# Patient Record
Sex: Female | Born: 1955
Health system: Southern US, Community
[De-identification: ages and names within clinical notes are randomized; demographics above are authoritative.]

## PROBLEM LIST (undated history)

## (undated) DIAGNOSIS — Z9289 Personal history of other medical treatment: Secondary | ICD-10-CM

## (undated) DIAGNOSIS — E039 Hypothyroidism, unspecified: Secondary | ICD-10-CM

## (undated) DIAGNOSIS — M8700 Idiopathic aseptic necrosis of unspecified bone: Secondary | ICD-10-CM

## (undated) DIAGNOSIS — N189 Chronic kidney disease, unspecified: Secondary | ICD-10-CM

## (undated) DIAGNOSIS — T8859XA Other complications of anesthesia, initial encounter: Secondary | ICD-10-CM

## (undated) DIAGNOSIS — B1910 Unspecified viral hepatitis B without hepatic coma: Secondary | ICD-10-CM

## (undated) DIAGNOSIS — B182 Chronic viral hepatitis C: Secondary | ICD-10-CM

## (undated) DIAGNOSIS — E669 Obesity, unspecified: Secondary | ICD-10-CM

## (undated) DIAGNOSIS — K219 Gastro-esophageal reflux disease without esophagitis: Secondary | ICD-10-CM

## (undated) DIAGNOSIS — Z9889 Other specified postprocedural states: Secondary | ICD-10-CM

## (undated) DIAGNOSIS — M25669 Stiffness of unspecified knee, not elsewhere classified: Secondary | ICD-10-CM

## (undated) DIAGNOSIS — IMO0001 Reserved for inherently not codable concepts without codable children: Secondary | ICD-10-CM

## (undated) DIAGNOSIS — D649 Anemia, unspecified: Secondary | ICD-10-CM

## (undated) DIAGNOSIS — J189 Pneumonia, unspecified organism: Secondary | ICD-10-CM

## (undated) DIAGNOSIS — M1990 Unspecified osteoarthritis, unspecified site: Secondary | ICD-10-CM

## (undated) DIAGNOSIS — T4145XA Adverse effect of unspecified anesthetic, initial encounter: Secondary | ICD-10-CM

## (undated) DIAGNOSIS — I809 Phlebitis and thrombophlebitis of unspecified site: Secondary | ICD-10-CM

## (undated) DIAGNOSIS — F32A Depression, unspecified: Secondary | ICD-10-CM

## (undated) DIAGNOSIS — R112 Nausea with vomiting, unspecified: Secondary | ICD-10-CM

## (undated) DIAGNOSIS — F329 Major depressive disorder, single episode, unspecified: Secondary | ICD-10-CM

## (undated) DIAGNOSIS — I10 Essential (primary) hypertension: Secondary | ICD-10-CM

## (undated) DIAGNOSIS — R252 Cramp and spasm: Secondary | ICD-10-CM

## (undated) DIAGNOSIS — R2689 Other abnormalities of gait and mobility: Secondary | ICD-10-CM

## (undated) HISTORY — PX: COLON SURGERY: SHX602

## (undated) HISTORY — PX: APPENDECTOMY: SHX54

## (undated) HISTORY — DX: Phlebitis and thrombophlebitis of unspecified site: I80.9

## (undated) HISTORY — PX: OTHER SURGICAL HISTORY: SHX169

## (undated) HISTORY — DX: Chronic kidney disease, unspecified: N18.9

## (undated) HISTORY — DX: Chronic viral hepatitis C: B18.2

## (undated) HISTORY — PX: ABDOMINAL HYSTERECTOMY: SHX81

## (undated) HISTORY — DX: Idiopathic aseptic necrosis of unspecified bone: M87.00

## (undated) HISTORY — PX: COLONOSCOPY: SHX174

## (undated) HISTORY — PX: TOTAL KNEE ARTHROPLASTY: SHX125

## (undated) HISTORY — PX: CHOLECYSTECTOMY: SHX55

## (undated) HISTORY — DX: Unspecified viral hepatitis B without hepatic coma: B19.10

## (undated) HISTORY — PX: JOINT REPLACEMENT: SHX530

---

## 1999-01-13 ENCOUNTER — Other Ambulatory Visit: Admission: RE | Admit: 1999-01-13 | Discharge: 1999-01-13 | Payer: Self-pay | Admitting: Family Medicine

## 1999-05-05 ENCOUNTER — Emergency Department (HOSPITAL_COMMUNITY): Admission: EM | Admit: 1999-05-05 | Discharge: 1999-05-05 | Payer: Self-pay | Admitting: Emergency Medicine

## 1999-05-06 ENCOUNTER — Encounter: Admission: RE | Admit: 1999-05-06 | Discharge: 1999-05-06 | Payer: Self-pay | Admitting: Hematology and Oncology

## 1999-07-15 ENCOUNTER — Emergency Department (HOSPITAL_COMMUNITY): Admission: EM | Admit: 1999-07-15 | Discharge: 1999-07-15 | Payer: Self-pay | Admitting: Emergency Medicine

## 1999-07-15 ENCOUNTER — Encounter: Payer: Self-pay | Admitting: Emergency Medicine

## 2000-03-17 ENCOUNTER — Encounter: Payer: Self-pay | Admitting: Specialist

## 2000-03-26 ENCOUNTER — Encounter: Payer: Self-pay | Admitting: Specialist

## 2000-03-26 ENCOUNTER — Inpatient Hospital Stay (HOSPITAL_COMMUNITY): Admission: RE | Admit: 2000-03-26 | Discharge: 2000-03-31 | Payer: Self-pay | Admitting: Specialist

## 2000-11-24 ENCOUNTER — Ambulatory Visit (HOSPITAL_COMMUNITY): Admission: RE | Admit: 2000-11-24 | Discharge: 2000-11-24 | Payer: Self-pay | Admitting: Family Medicine

## 2001-01-15 ENCOUNTER — Emergency Department (HOSPITAL_COMMUNITY): Admission: EM | Admit: 2001-01-15 | Discharge: 2001-01-15 | Payer: Self-pay | Admitting: Emergency Medicine

## 2001-06-28 ENCOUNTER — Emergency Department (HOSPITAL_COMMUNITY): Admission: EM | Admit: 2001-06-28 | Discharge: 2001-06-28 | Payer: Self-pay | Admitting: Emergency Medicine

## 2001-06-28 ENCOUNTER — Encounter: Payer: Self-pay | Admitting: Emergency Medicine

## 2001-11-10 ENCOUNTER — Encounter: Payer: Self-pay | Admitting: Emergency Medicine

## 2001-11-10 ENCOUNTER — Emergency Department (HOSPITAL_COMMUNITY): Admission: EM | Admit: 2001-11-10 | Discharge: 2001-11-10 | Payer: Self-pay | Admitting: *Deleted

## 2001-11-14 ENCOUNTER — Encounter: Payer: Self-pay | Admitting: General Surgery

## 2001-11-14 ENCOUNTER — Observation Stay (HOSPITAL_COMMUNITY): Admission: RE | Admit: 2001-11-14 | Discharge: 2001-11-15 | Payer: Self-pay | Admitting: General Surgery

## 2001-11-14 ENCOUNTER — Encounter (INDEPENDENT_AMBULATORY_CARE_PROVIDER_SITE_OTHER): Payer: Self-pay | Admitting: Specialist

## 2003-08-22 ENCOUNTER — Ambulatory Visit (HOSPITAL_COMMUNITY): Admission: RE | Admit: 2003-08-22 | Discharge: 2003-08-22 | Payer: Self-pay | Admitting: Family Medicine

## 2004-01-10 ENCOUNTER — Encounter: Admission: RE | Admit: 2004-01-10 | Discharge: 2004-01-10 | Payer: Self-pay | Admitting: Family Medicine

## 2004-02-29 ENCOUNTER — Emergency Department (HOSPITAL_COMMUNITY): Admission: EM | Admit: 2004-02-29 | Discharge: 2004-02-29 | Payer: Self-pay | Admitting: Emergency Medicine

## 2004-06-19 ENCOUNTER — Observation Stay (HOSPITAL_COMMUNITY): Admission: RE | Admit: 2004-06-19 | Discharge: 2004-06-20 | Payer: Self-pay | Admitting: Specialist

## 2009-03-09 ENCOUNTER — Inpatient Hospital Stay (HOSPITAL_COMMUNITY): Admission: EM | Admit: 2009-03-09 | Discharge: 2009-03-12 | Payer: Self-pay | Admitting: Emergency Medicine

## 2009-03-12 ENCOUNTER — Encounter (INDEPENDENT_AMBULATORY_CARE_PROVIDER_SITE_OTHER): Payer: Self-pay | Admitting: Internal Medicine

## 2009-04-22 ENCOUNTER — Ambulatory Visit: Payer: Self-pay | Admitting: Family Medicine

## 2009-04-25 DIAGNOSIS — D696 Thrombocytopenia, unspecified: Secondary | ICD-10-CM

## 2009-04-25 DIAGNOSIS — E039 Hypothyroidism, unspecified: Secondary | ICD-10-CM | POA: Insufficient documentation

## 2009-04-29 ENCOUNTER — Ambulatory Visit: Payer: Self-pay | Admitting: Cardiovascular Disease

## 2009-04-29 DIAGNOSIS — I739 Peripheral vascular disease, unspecified: Secondary | ICD-10-CM

## 2009-04-30 ENCOUNTER — Ambulatory Visit: Payer: Self-pay | Admitting: *Deleted

## 2009-05-03 ENCOUNTER — Encounter (INDEPENDENT_AMBULATORY_CARE_PROVIDER_SITE_OTHER): Payer: Self-pay | Admitting: *Deleted

## 2009-06-19 ENCOUNTER — Encounter: Payer: Self-pay | Admitting: Family Medicine

## 2009-06-19 ENCOUNTER — Ambulatory Visit: Payer: Self-pay | Admitting: Internal Medicine

## 2009-06-19 LAB — CONVERTED CEMR LAB
ALT: 68 units/L — ABNORMAL HIGH (ref 0–35)
AST: 82 units/L — ABNORMAL HIGH (ref 0–37)
Albumin: 4.3 g/dL (ref 3.5–5.2)
Alkaline Phosphatase: 87 units/L (ref 39–117)
Amphetamine Screen, Ur: NEGATIVE
BUN: 24 mg/dL — ABNORMAL HIGH (ref 6–23)
Barbiturate Quant, Ur: NEGATIVE
Basophils Absolute: 0 10*3/uL (ref 0.0–0.1)
Basophils Relative: 0 % (ref 0–1)
Benzodiazepines.: NEGATIVE
CO2: 21 meq/L (ref 19–32)
Calcium: 9.4 mg/dL (ref 8.4–10.5)
Chloride: 104 meq/L (ref 96–112)
Cholesterol: 148 mg/dL (ref 0–200)
Cocaine Metabolites: NEGATIVE
Creatinine, Ser: 0.95 mg/dL (ref 0.40–1.20)
Creatinine,U: 221.4 mg/dL
Eosinophils Absolute: 0.1 10*3/uL (ref 0.0–0.7)
Eosinophils Relative: 1 % (ref 0–5)
Glucose, Bld: 116 mg/dL — ABNORMAL HIGH (ref 70–99)
HCT: 40.8 % (ref 36.0–46.0)
HDL: 51 mg/dL (ref >39)
Hemoglobin: 13 g/dL (ref 12.0–15.0)
LDL Cholesterol: 74 mg/dL (ref 0–99)
Lymphocytes Relative: 37 % (ref 12–46)
Lymphs Abs: 4.1 10*3/uL — ABNORMAL HIGH (ref 0.7–4.0)
MCHC: 31.9 g/dL (ref 30.0–36.0)
MCV: 96.7 fL (ref 78.0–100.0)
Marijuana Metabolite: NEGATIVE
Methadone: NEGATIVE
Monocytes Absolute: 0.7 10*3/uL (ref 0.1–1.0)
Monocytes Relative: 6 % (ref 3–12)
Neutro Abs: 6.2 10*3/uL (ref 1.7–7.7)
Neutrophils Relative %: 56 % (ref 43–77)
Opiate Screen, Urine: NEGATIVE
Phencyclidine (PCP): NEGATIVE
Platelets: 164 10*3/uL (ref 150–400)
Potassium: 4.4 meq/L (ref 3.5–5.3)
Propoxyphene: NEGATIVE
RBC: 4.22 M/uL (ref 3.87–5.11)
RDW: 14.5 % (ref 11.5–15.5)
Sodium: 138 meq/L (ref 135–145)
TSH: 3.225 microintl units/mL (ref 0.350–4.500)
Total Bilirubin: 0.4 mg/dL (ref 0.3–1.2)
Total CHOL/HDL Ratio: 2.9
Total Protein: 8.3 g/dL (ref 6.0–8.3)
Triglycerides: 116 mg/dL (ref <150)
VLDL: 23 mg/dL (ref 0–40)
WBC: 11.1 10*3/uL — ABNORMAL HIGH (ref 4.0–10.5)

## 2009-07-12 ENCOUNTER — Ambulatory Visit: Payer: Self-pay | Admitting: Internal Medicine

## 2009-07-25 ENCOUNTER — Encounter: Payer: Self-pay | Admitting: Family Medicine

## 2009-07-25 ENCOUNTER — Ambulatory Visit: Payer: Self-pay | Admitting: Internal Medicine

## 2009-07-25 LAB — CONVERTED CEMR LAB
ALT: 60 units/L — ABNORMAL HIGH (ref 0–35)
AST: 74 units/L — ABNORMAL HIGH (ref 0–37)
Albumin: 4 g/dL (ref 3.5–5.2)
Alkaline Phosphatase: 78 units/L (ref 39–117)
BUN: 12 mg/dL (ref 6–23)
CO2: 23 meq/L (ref 19–32)
Calcium: 9 mg/dL (ref 8.4–10.5)
Chloride: 105 meq/L (ref 96–112)
Creatinine, Ser: 0.7 mg/dL (ref 0.40–1.20)
Glucose, Bld: 146 mg/dL — ABNORMAL HIGH (ref 70–99)
HCV Ab: REACTIVE — AB
Hep A Total Ab: NEGATIVE
Hep B Core Total Ab: NEGATIVE
Hep B S Ab: NEGATIVE
Hepatitis B Surface Ag: NEGATIVE
Hgb A1c MFr Bld: 5.6 % (ref 4.6–6.1)
Potassium: 3.8 meq/L (ref 3.5–5.3)
Sed Rate: 21 mm/hr (ref 0–22)
Sodium: 139 meq/L (ref 135–145)
Total Bilirubin: 0.5 mg/dL (ref 0.3–1.2)
Total Protein: 7.7 g/dL (ref 6.0–8.3)

## 2009-08-15 ENCOUNTER — Ambulatory Visit: Payer: Self-pay | Admitting: Internal Medicine

## 2009-08-15 LAB — CONVERTED CEMR LAB
HCV Quantitative: 242000 intl units/mL — ABNORMAL HIGH (ref <43)
INR: 1.08 (ref <1.50)
Prothrombin Time: 13.9 s (ref 11.6–15.2)

## 2009-08-19 ENCOUNTER — Encounter (INDEPENDENT_AMBULATORY_CARE_PROVIDER_SITE_OTHER): Payer: Self-pay | Admitting: Internal Medicine

## 2009-08-20 ENCOUNTER — Ambulatory Visit (HOSPITAL_COMMUNITY): Admission: RE | Admit: 2009-08-20 | Discharge: 2009-08-20 | Payer: Self-pay | Admitting: Internal Medicine

## 2009-08-29 ENCOUNTER — Ambulatory Visit: Payer: Self-pay | Admitting: Internal Medicine

## 2009-09-12 ENCOUNTER — Ambulatory Visit: Payer: Self-pay | Admitting: Gastroenterology

## 2009-09-12 ENCOUNTER — Encounter (INDEPENDENT_AMBULATORY_CARE_PROVIDER_SITE_OTHER): Payer: Self-pay | Admitting: *Deleted

## 2009-10-02 ENCOUNTER — Ambulatory Visit: Payer: Self-pay | Admitting: Internal Medicine

## 2009-12-23 ENCOUNTER — Ambulatory Visit: Payer: Self-pay | Admitting: Internal Medicine

## 2009-12-23 LAB — CONVERTED CEMR LAB
ALT: 61 units/L — ABNORMAL HIGH (ref 0–35)
AST: 77 units/L — ABNORMAL HIGH (ref 0–37)
Albumin: 3.9 g/dL (ref 3.5–5.2)
Alkaline Phosphatase: 90 units/L (ref 39–117)
BUN: 14 mg/dL (ref 6–23)
CO2: 22 meq/L (ref 19–32)
CRP: 0 mg/dL (ref <0.6)
Calcium: 8.9 mg/dL (ref 8.4–10.5)
Chloride: 105 meq/L (ref 96–112)
Cholesterol: 139 mg/dL (ref 0–200)
Creatinine, Ser: 0.59 mg/dL (ref 0.40–1.20)
Glucose, Bld: 100 mg/dL — ABNORMAL HIGH (ref 70–99)
HDL: 51 mg/dL (ref >39)
LDL Cholesterol: 70 mg/dL (ref 0–99)
Potassium: 4.7 meq/L (ref 3.5–5.3)
Sodium: 138 meq/L (ref 135–145)
Total Bilirubin: 0.5 mg/dL (ref 0.3–1.2)
Total CHOL/HDL Ratio: 2.7
Total Protein: 8 g/dL (ref 6.0–8.3)
Triglycerides: 91 mg/dL (ref <150)
VLDL: 18 mg/dL (ref 0–40)

## 2010-01-02 ENCOUNTER — Ambulatory Visit: Payer: Self-pay | Admitting: Internal Medicine

## 2010-01-02 LAB — CONVERTED CEMR LAB
DHEA-SO4: 21 ug/dL — ABNORMAL LOW (ref 35–430)
Magnesium: 2.1 mg/dL (ref 1.5–2.5)
Vit D, 25-Hydroxy: <10 ng/mL — ABNORMAL LOW (ref 30–89)

## 2010-01-10 ENCOUNTER — Ambulatory Visit (HOSPITAL_COMMUNITY): Admission: RE | Admit: 2010-01-10 | Discharge: 2010-01-10 | Payer: Self-pay | Admitting: Internal Medicine

## 2010-01-31 ENCOUNTER — Ambulatory Visit: Payer: Self-pay | Admitting: Internal Medicine

## 2010-02-14 ENCOUNTER — Ambulatory Visit (HOSPITAL_COMMUNITY): Admission: RE | Admit: 2010-02-14 | Discharge: 2010-02-14 | Payer: Self-pay | Admitting: Internal Medicine

## 2010-02-20 ENCOUNTER — Encounter: Admission: RE | Admit: 2010-02-20 | Discharge: 2010-04-24 | Payer: Self-pay | Admitting: Internal Medicine

## 2010-02-28 ENCOUNTER — Ambulatory Visit: Payer: Self-pay | Admitting: Internal Medicine

## 2010-04-24 ENCOUNTER — Ambulatory Visit: Payer: Self-pay | Admitting: Internal Medicine

## 2010-04-24 LAB — CONVERTED CEMR LAB
Microalb, Ur: 1.06 mg/dL (ref 0.00–1.89)
Vit D, 25-Hydroxy: 23 ng/mL — ABNORMAL LOW (ref 30–89)

## 2010-05-31 IMAGING — CT CT ANGIO CHEST
2 of 7 series · 19 of 36 positions shown · IV contrast (APPLIED)
Comparison: Chest x-ray of 03/09/2009

CLINICAL DATA: Chest pain, shortness of breath

CT ANGIOGRAPHY CHEST WITH CONTRAST
TECHNIQUE: Multidetector CT imaging of the chest was performed
using the standard protocol during bolus administration of
intravenous contrast. Multiplanar CT image reconstructions
including MIPs were obtained to evaluate the vascular anatomy.
Contrast: 100 ml [id number]-X55

[Series 8: pulm embolism 1.0 b25f thins · axial · 0.67mm/px · z∈[+966,+1214]mm · 18 of 278 slices shown]
[im 15/278  lung]
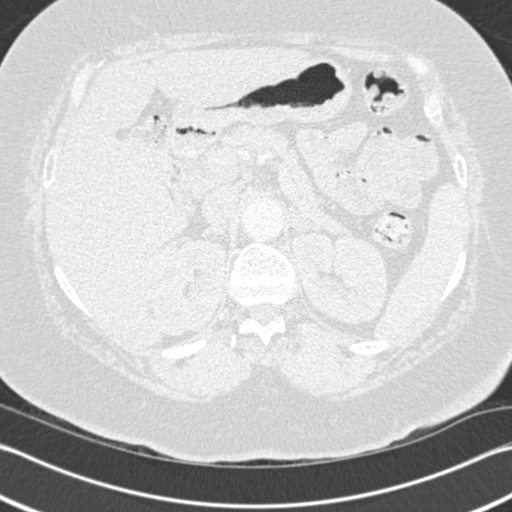
[im 30/278  mediastinal]
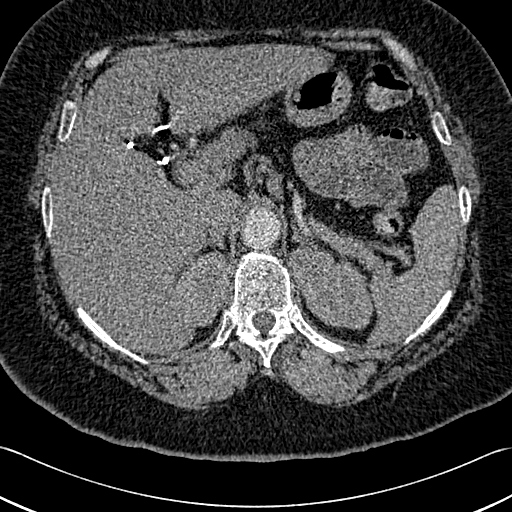
[im 44/278  lung]
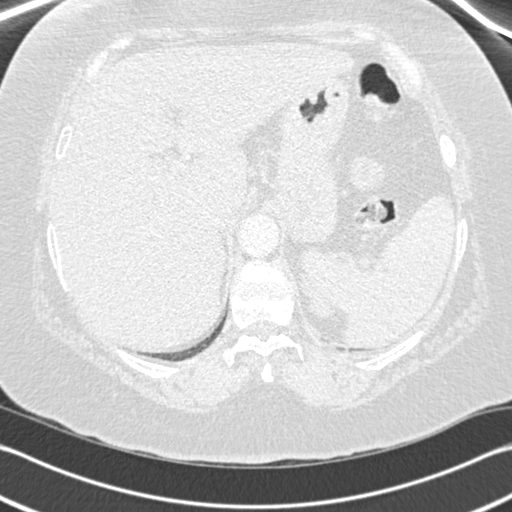
[im 59/278  mediastinal]
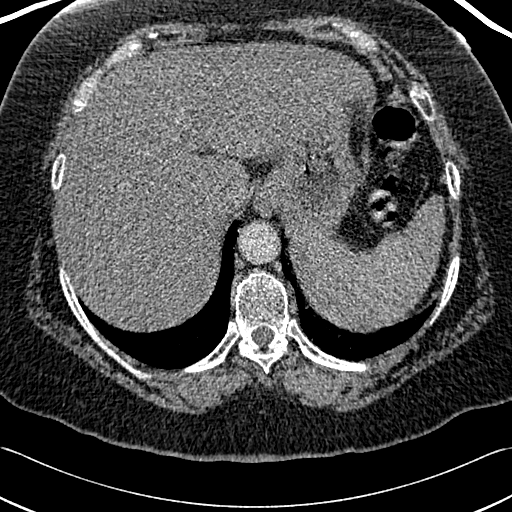
[im 73/278  lung]
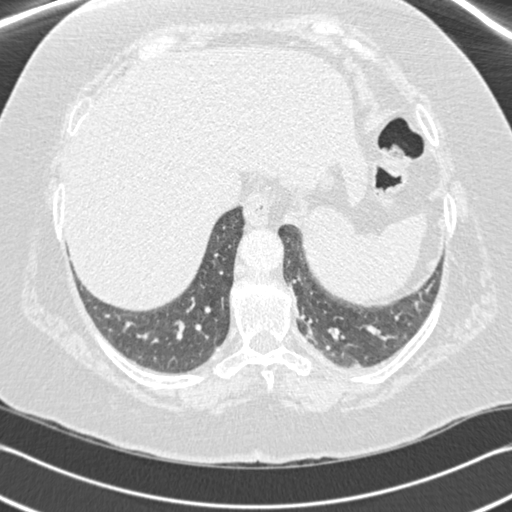
[im 88/278  mediastinal]
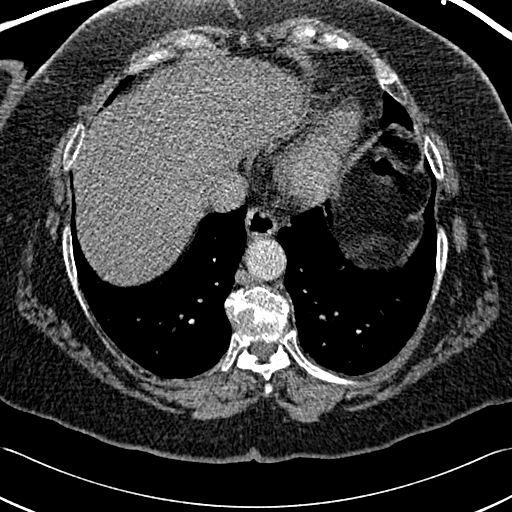
[im 103/278  lung]
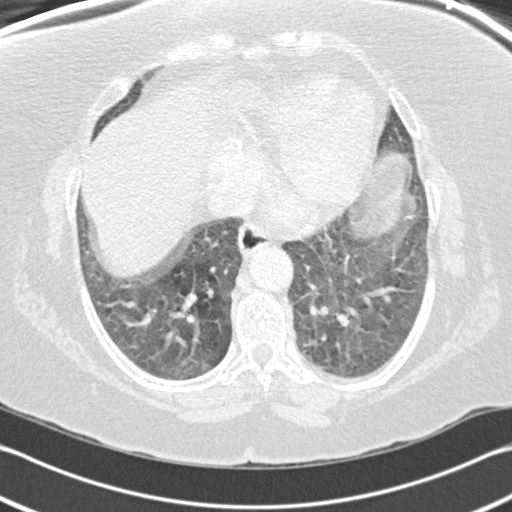
[im 117/278  mediastinal]
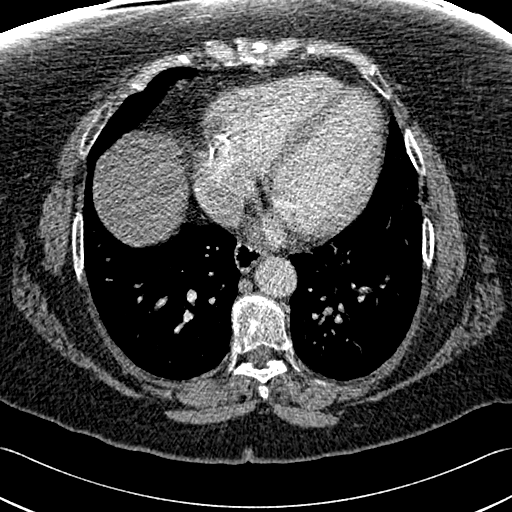
[im 132/278  lung]
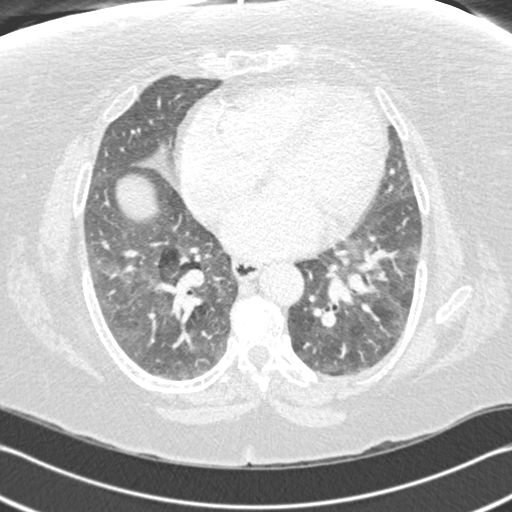
[im 146/278  mediastinal]
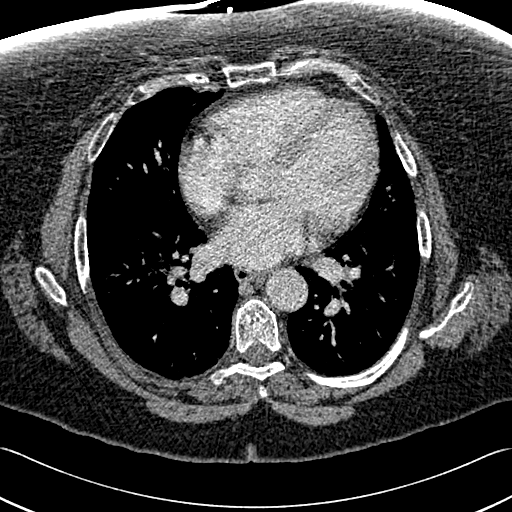
[im 161/278  lung]
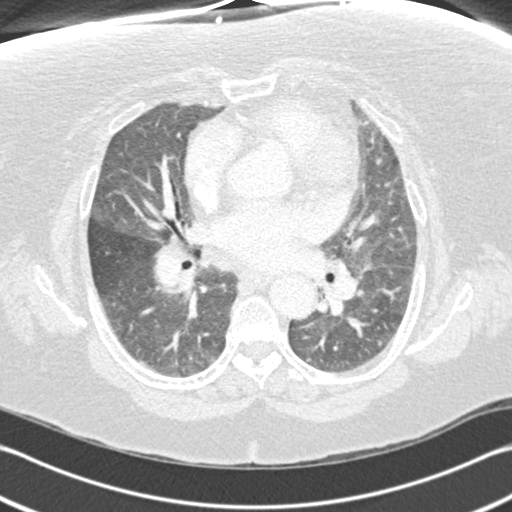
[im 175/278  mediastinal]
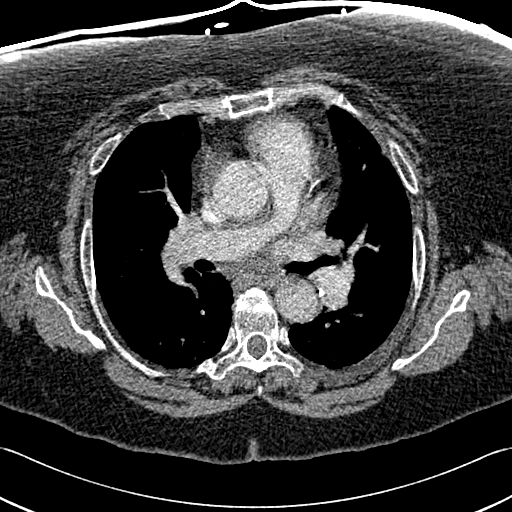
[im 190/278  lung]
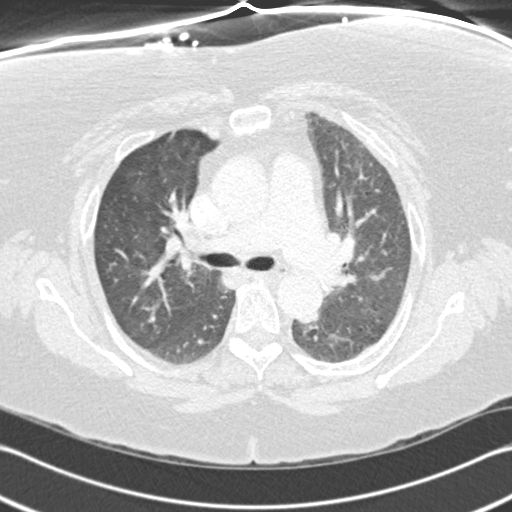
[im 205/278  mediastinal]
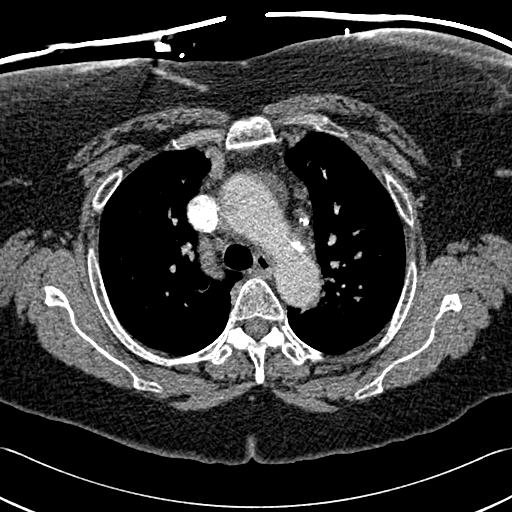
[im 219/278  lung]
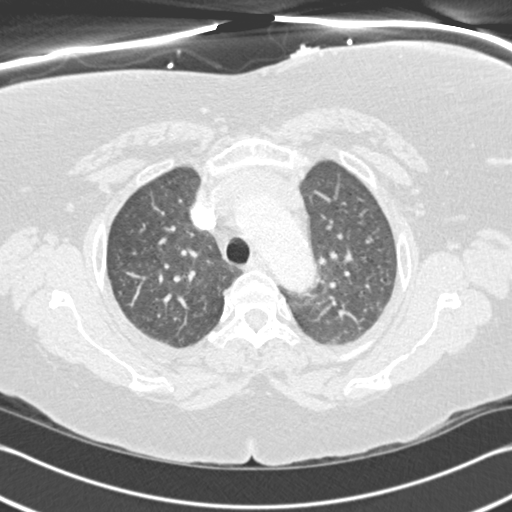
[im 234/278  mediastinal]
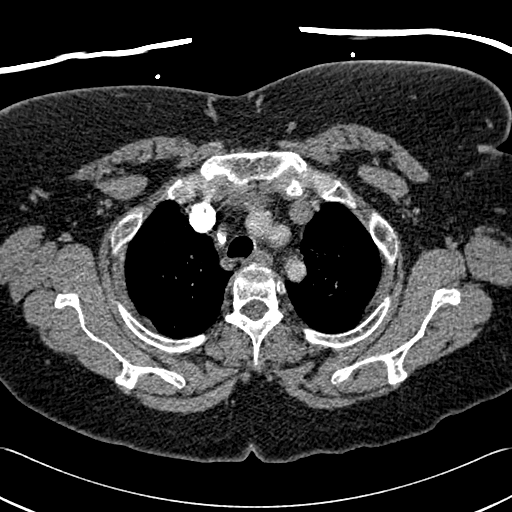
[im 248/278  lung]
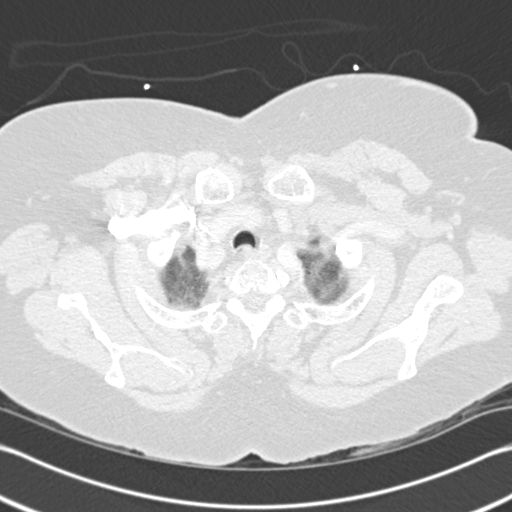
[im 263/278  mediastinal]
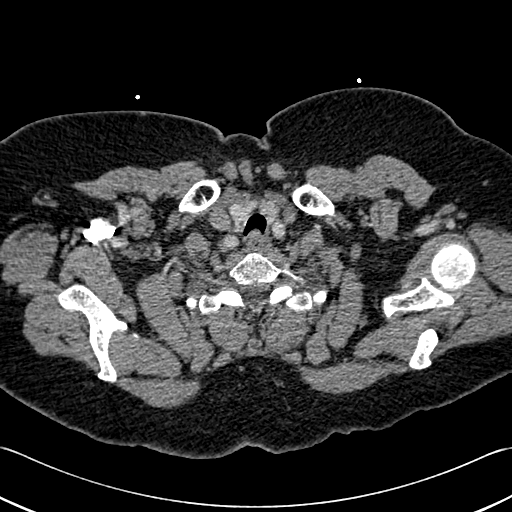

[Series 9: pulm embolism 2.0 spo thins · coronal · 0.63mm/px · 1 of 151 slices shown]
[im 76/151  mediastinal]
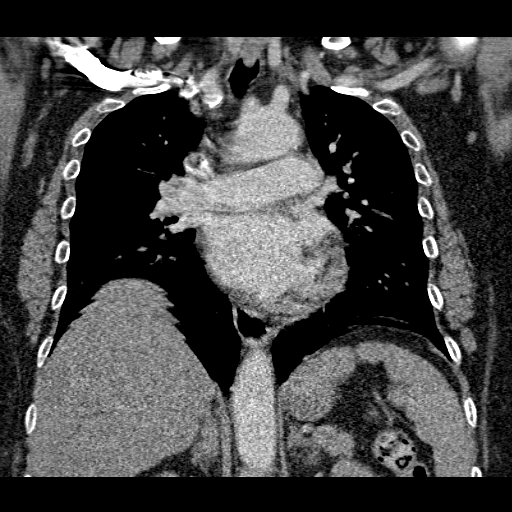

[19 of 36 positions shown; findings below may reference images not displayed]

FINDINGS: Resolution is suboptimal in this patient with large body
habitus, and opacification of the pulmonary arteries is moderate.
There is no definite evidence of pulmonary embolism identified.
The thoracic aorta opacifies with no acute abnormality noted.  No
mediastinal or hilar adenopathy is seen.  A few calcified
mediastinal nodes are present presumably due to prior granulomatous
disease.  Surgical clips are present from prior cholecystectomy.
There is some ground-glass opacity particularly within the lower
lobes, right middle lobe, and lingula.  This is nonspecific but can
be seen with acute process such as pneumonia or edema.  Follow-up
chest x-ray may be helpful.

Review of the MIP images confirms the above findings.
IMPRESSION: 1.  No evidence of acute pulmonary embolism.  No thoracic aortic
abnormality.
2.  There is vague ground-glass opacity in the lower lobes
bilaterally.  This is not specific but can be seen with pneumonia
or edema.  Consider follow-up chest x-ray.

## 2010-07-18 ENCOUNTER — Emergency Department (HOSPITAL_COMMUNITY): Admission: EM | Admit: 2010-07-18 | Discharge: 2010-07-18 | Payer: Self-pay | Admitting: Family Medicine

## 2010-07-21 ENCOUNTER — Emergency Department (HOSPITAL_COMMUNITY): Admission: EM | Admit: 2010-07-21 | Discharge: 2010-07-21 | Payer: Self-pay | Admitting: Family Medicine

## 2010-07-25 ENCOUNTER — Encounter (INDEPENDENT_AMBULATORY_CARE_PROVIDER_SITE_OTHER): Payer: Self-pay | Admitting: *Deleted

## 2010-07-25 LAB — CONVERTED CEMR LAB: HCV Quantitative: 211000 intl units/mL — ABNORMAL HIGH (ref <43)

## 2010-10-09 NOTE — Letter (Signed)
Summary: Appointment - Reminder [facility] [place]  [address]   [place], [place] [address]   Phone: [phone]  Fax: [phone]     September 12, 2009 MRN: [id number]   [facility] [address] [place], [place]  [address]   Dear Ms. Mende,  Our records indicate that it is time to schedule a follow-up appointment with Dr. Johnsie Cancel. It is very important that we reach you to schedule this appointment. We look forward to participating in your health care needs. Please contact us at the number listed above at your earliest convenience to schedule your appointment.  If you are unable to make an appointment at this time, give [place] a call so we can update our records.  Sincerely,   Darnell Level [facility] Scheduling Team

## 2010-11-18 LAB — CULTURE, ROUTINE-ABSCESS

## 2010-12-14 LAB — T4, FREE: Free T4: 0.77 ng/dL — ABNORMAL LOW (ref 0.80–1.80)

## 2010-12-14 LAB — CBC
HCT: 31.9 % — ABNORMAL LOW (ref 36.0–46.0)
HCT: 33.4 % — ABNORMAL LOW (ref 36.0–46.0)
HCT: 33.4 % — ABNORMAL LOW (ref 36.0–46.0)
Hemoglobin: 11.5 g/dL — ABNORMAL LOW (ref 12.0–15.0)
MCHC: 34.5 g/dL (ref 30.0–36.0)
MCHC: 34.6 g/dL (ref 30.0–36.0)
MCV: 93.1 fL (ref 78.0–100.0)
MCV: 93.9 fL (ref 78.0–100.0)
MCV: 94 fL (ref 78.0–100.0)
Platelets: 93 10*3/uL — ABNORMAL LOW (ref 150–400)
RBC: 3.39 MIL/uL — ABNORMAL LOW (ref 3.87–5.11)
RBC: 3.55 MIL/uL — ABNORMAL LOW (ref 3.87–5.11)
RDW: 13.9 % (ref 11.5–15.5)
WBC: 5.5 10*3/uL (ref 4.0–10.5)

## 2010-12-14 LAB — COMPREHENSIVE METABOLIC PANEL
ALT: 63 U/L — ABNORMAL HIGH (ref 0–35)
BUN: 11 mg/dL (ref 6–23)
CO2: 29 mEq/L (ref 19–32)
Calcium: 8.7 mg/dL (ref 8.4–10.5)
Creatinine, Ser: 0.65 mg/dL (ref 0.4–1.2)
GFR calc non Af Amer: >60 mL/min (ref >60)
Glucose, Bld: 88 mg/dL (ref 70–99)
Sodium: 140 mEq/L (ref 135–145)

## 2010-12-14 LAB — POCT CARDIAC MARKERS
CKMB, poc: <1 ng/mL — ABNORMAL LOW (ref 1.0–8.0)
Myoglobin, poc: 70.7 ng/mL (ref 12–200)
Myoglobin, poc: 71.2 ng/mL (ref 12–200)
Troponin i, poc: <0.05 ng/mL (ref 0.00–0.09)
Troponin i, poc: <0.05 ng/mL (ref 0.00–0.09)

## 2010-12-14 LAB — CULTURE, BLOOD (ROUTINE X 2)
Culture: NO GROWTH
Culture: NO GROWTH

## 2010-12-14 LAB — URINALYSIS, MICROSCOPIC ONLY
Glucose, UA: NEGATIVE mg/dL
Ketones, ur: NEGATIVE mg/dL
Leukocytes, UA: NEGATIVE
pH: 6 (ref 5.0–8.0)

## 2010-12-14 LAB — POCT I-STAT, CHEM 8
BUN: 11 mg/dL (ref 6–23)
Calcium, Ion: 1.01 mmol/L — ABNORMAL LOW (ref 1.12–1.32)
Chloride: 107 mEq/L (ref 96–112)
Creatinine, Ser: 0.6 mg/dL (ref 0.4–1.2)
Glucose, Bld: 82 mg/dL (ref 70–99)
HCT: 38 % (ref 36.0–46.0)
Hemoglobin: 12.9 g/dL (ref 12.0–15.0)
Potassium: 3.9 mEq/L (ref 3.5–5.1)
Sodium: 139 mEq/L (ref 135–145)
TCO2: 23 mmol/L (ref 0–100)

## 2010-12-14 LAB — CK TOTAL AND CKMB (NOT AT ARMC)
Relative Index: INVALID (ref 0.0–2.5)
Relative Index: INVALID (ref 0.0–2.5)

## 2010-12-14 LAB — BASIC METABOLIC PANEL
BUN: 9 mg/dL (ref 6–23)
Chloride: 105 mEq/L (ref 96–112)
Creatinine, Ser: 0.56 mg/dL (ref 0.4–1.2)
Glucose, Bld: 109 mg/dL — ABNORMAL HIGH (ref 70–99)
Potassium: 4.1 mEq/L (ref 3.5–5.1)

## 2010-12-14 LAB — LEGIONELLA ANTIGEN, URINE

## 2010-12-14 LAB — D-DIMER, QUANTITATIVE: D-Dimer, Quant: 0.7 ug/mL-FEU — ABNORMAL HIGH (ref 0.00–0.48)

## 2010-12-14 LAB — TROPONIN I: Troponin I: 0.01 ng/mL (ref 0.00–0.06)

## 2010-12-14 LAB — FOLATE RBC: RBC Folate: 603 ng/mL — ABNORMAL HIGH (ref 180–600)

## 2010-12-14 LAB — LIPID PANEL: Triglycerides: 60 mg/dL (ref <150)

## 2011-01-20 NOTE — H&P (Signed)
NAMEKOBEE, SIKKEMA                 ACCOUNT NO.:  [id number]   MEDICAL RECORD NO.:  [id number]          PATIENT TYPE:  INP   LOCATION:  3707                         FACILITY:  [facility]   PHYSICIAN:  Rise Patience, MDDATE OF BIRTH:  1956-04-09   DATE OF ADMISSION:  03/09/2009  DATE OF DISCHARGE:                              HISTORY & PHYSICAL   PRIMARY CARE PHYSICIAN:  Unassigned.   CHIEF COMPLAINT:  Chest pain.   HISTORY OF PRESENT ILLNESS:  A 55 year old female with known history of  tobacco use comes in with complaint of chest pain since last evening.  The patient's chest pain has been pressure-like, retrosternal which  initially started off in the axillary region and now is more  retrosternal on the left anterior chest wall, constant, heavy,  nonradiating.  The patient also has another component of chest pain  which increases when she takes deep breaths.  Has no exertional  symptoms.  Denies any fever or chills, cough or productive sputum.  He  denies any nausea or vomiting, diarrhea, abdominal pain, dizziness, loss  of consciousness, or focal deficits.   The patient had a CT scan of the chest which showed a vague ground glass  opacity of the lower lobes bilaterally.  It is nonspecific but could be  seen in pneumonia or edema.  At this time the patient is being admitted  for further evaluation and management of possible pneumonia and chest  pain.   PAST MEDICAL HISTORY:  Nothing significant seen.   PAST SURGICAL HISTORY:  1. Multiple surgeries for right knee.  2. Cholecystectomy.   MEDICATIONS PRIOR TO ADMISSION:  Advil p.r.n.   ALLERGIES:  PENICILLIN caused a severe reaction, hives and angioedema.   FAMILY HISTORY:  Nothing positive.   SOCIAL HISTORY:  The[ smoked cigarettes.  Has been advised to quit  smoking.  No alcohol or drug abuse.   REVIEW OF SYSTEMS:  Per history of present illness.  Nothing else  significant.   PHYSICAL EXAMINATION:  GENERAL:  The  patient was examined at the  bedside.  Not in acute distress.  VITAL SIGNS:  Blood pressure 134/98, pulse 60 per minute, temperature  97.9, respiratory rate 18, oxygen saturation 98%.  Marland Kitchen  HEENT:  Anicteric.  No pallor.  LUNGS:  Bilateral air entry.  No rhonchi on auscultation.  HEART:  Regular S1 and S2.  ABDOMEN:  Soft, nontender.  Bowel sounds heard.  CNS:  Alert and oriented to time, place and person.  Motor strength 5/5.  EXTREMITIES:  Peripheral pulses full. No edema.   LABORATORY DATA:  CT scan of the chest angio protocol .  No evidence of  acute pulmonary embolism.  No thoracic artery anomaly.  Vague ground  glass opacity in the lower lobes bilaterally.  This is nonspecific but  can be seen in pneumonia or edema, suggest followup chest x-ray.  EKG  showed normal sinus rhythm with T wave changes in V1 and V2 which are  nonspecific.  Hemoglobin 12.9, hematocrit 38. D-dimer 0.7.  Sodium 139,  potassium 3.9, chloride 107, glucose 32, BUN 12,  creatinine 0.6.  Troponin less than 0.05.   ASSESSMENT:  1. Chest pain, rule out effusion.  2. Possible pneumonia.  3. Ongoing tobacco abuse.   PLAN:  1. Will admit patient to telemetry.  2. Will get cultures of sputum and blood.  3. Will start the patient on Avelox.  4. Will cycle cardiac markers.  5. Get a 2-D echo.  6. Further recommendations as condition evolves.      Rise Patience, MD  Electronically Signed     ANK/MEDQ  D:  03/09/2009  T:  03/10/2009  Job:  [id number]

## 2011-01-20 NOTE — Discharge Summary (Signed)
[name], [name]                 ACCOUNT NO.:  [id number]   MEDICAL RECORD NO.:  [id number]          PATIENT TYPE:  INP   LOCATION:  3707                         FACILITY:  [facility]   PHYSICIAN:  Rexene Alberts, M.D.    DATE OF BIRTH:  Aug 10, 1956   DATE OF ADMISSION:  03/09/2009  DATE OF DISCHARGE:  03/12/2009                               DISCHARGE SUMMARY   DISCHARGE DIAGNOSES:  1. Chest pain, likely musculoskeletal in origin.  2. Ground-glass opacities seen on the CT angiogram of the chest on      March 09, 2009.  3. Tobacco abuse.  4. Thrombocytopenia, etiology unclear.  The patient's platelet count      ranged from 88,000 to 96,000.  5. Newly diagnosed hypothyroidism.  6. Chronic degenerative joint disease of the knees.  7. Bradycardia, possibly secondary to hypothyroidism.  8. Normocytic anemia.   DISCHARGE MEDICATIONS:  1. Synthroid 25 mcg daily.  2. Vicodin 5/325 mg 1 tablet 2-3 times daily as needed.  3. Over-the-counter ranitidine 1 tablet daily.  4. Advil 200 mg 1-2 tablets every 6 hours as needed for pain.  5. Multivitamin with iron once daily.   DISCHARGE DISPOSITION:  The patient is being discharged to home in  improved and stable condition.  She was advised to follow up with her  new primary care physician, Dr. Gunnar Bulla at the [facility] on  April 22, 2009 at 10:15 p.m.   CONSULTATIONS:  None.   PROCEDURES PERFORMED:  1. Chest x-ray on March 10, 2009.  The results revealed minimally      prominent markings, may be chronic in nature.  No definite active      processes seen.  2. A 2-D echocardiogram performed on March 12, 2009.  The results are      pending.  3. CT angiogram of the chest on March 09, 2009.  The results revealed no      evidence of acute pulmonary embolism.  There is a vague ground-      glass opacity in the lower lobes bilaterally.  Nonspecific.  4. Chest x-ray on March 09, 2009.  The results revealed stable chronic      interstitial change.  No  active process.   HISTORY OF PRESENT ILLNESS:  The patient is a 55 year old woman with a  past medical history significant for degenerative joint disease, who  presented to the emergency department on March 09, 2009 with a chief  complaint of chest pain.  When she was evaluated in the emergency  department, she was noted to be hemodynamically stable and afebrile.  Her chest x-ray revealed no acute ST or T-wave abnormalities.  Her heart  rate was 73 beats per minute.  Her chest x-ray revealed no acute  cardiopulmonary findings.  Her initial cardiac markers were negative.  She was admitted for further evaluation and management.   For additional details, please see the dictated history and physical.   HOSPITAL COURSE:  1. CHEST PAIN SECONDARY TO MUSCULOSKELETAL STRAIN, BRADYCARDIA,      HYPOTHYROIDISM, AND TOBACCO ABUSE.  The patient was initially  started on Lovenox prophylactically and antiplatelet therapy with      aspirin 325 mg daily.  Her pain was treated with as-needed      oxycodone and as-needed sublingual nitroglycerin.  For further      evaluation, a number of studies were ordered including additional      cardiac enzymes, D-dimer, fasting lipid panel, and 2-D      echocardiogram.  The 2-D echocardiogram was performed today and the      results are pending.  Her cardiac markers were well within normal      limits.  Her fasting lipid profile revealed a total cholesterol of      117, triglycerides of 60, HDL cholesterol of 39, and LDL      cholesterol of 66.  Her D-dimer was elevated at 0.70.  Therefore, a      CT angiogram of the chest was ordered to rule out PE.  The CT      angiogram was negative for PE, however, it did reveal vague ground-      glass opacities in the bases.  The patient had been started on      Avelox empirically in the emergency department for what was thought      to be possible pneumonia.  However, upon further questioning, she      had no complaints of  a purulent cough or shortness of breath or      subjective fever.  Therefore, Avelox was discontinued.  Her thyroid      function was assessed because of bradycardia.  Her TSH was within      normal limits at 2.78, however, her free T4 was low at 0.77.      Therefore, the patient was started on Synthroid 25 mcg daily prior      to hospital discharge.  As stated above, the results of the 2-D      echocardiogram are pending.  The patient also disclosed that she      smokes half-a-pack of cigarettes per day and has been doing so for      many years.  She was strongly admonished to stop smoking.  Upon my      examinations, the patient clearly had chest wall pain that was      somewhat positional.  The nitroglycerin which had been given in the      emergency department apparently did not help much.  She was started      on scheduled hydrocodone along with scheduled ibuprofen.  After 24      hours of this treatment, her chest pain completely resolved.  2. GROUND-GLASS OPACITIES.  The etiology of these findings is unclear.      It is uncertain whether or not these findings are chronic or new.      The differential diagnoses included pneumonia and congestive heart      failure.  Clinically, she appeared to be free of pneumonia and free      of congestive heart failure.  Further management will be deferred      to her primary care physician.  3. THROMBOCYTOPENIA.  At the time of the initial hospital assessment,      the patient's platelet count was 93.  Given this finding, Lovenox      and aspirin were discontinued following a decline in her platelet      count to 88.  Vitamin B12 and folate levels were ordered.  Both  were within normal limits (vitamin B12 was 612 and RBC folate was      603).  Her platelet count remained low but stable.  Further      evaluation will be deferred to her primary care physician.  4. DEGENERATIVE JOINT DISEASE OF THE KNEES.  The patient was      maintained on  analgesics for chronic right greater than left knee      pain.  The patient noted that she has had multiple surgeries on her      right knee which cause her to limp.      Rexene Alberts, M.D.  Electronically Signed     DF/MEDQ  D:  03/12/2009  T:  03/13/2009  Job:  [id number]   cc:   Randa Spike, MD

## 2011-01-23 NOTE — Op Note (Signed)
[facility]  Patient:    [name] Visit Number: [id number] MRN: [id number]          Service Type: EMS Location: [facility] Attending Physician:  Burnard Hawthorne Dictated by:   Orson Ape. Rise Patience, M.D. Proc. Date: 11/14/01 Admit Date:  11/10/2001 Discharge Date: 11/10/2001                             Operative Report  PREOPERATIVE DIAGNOSIS:  Chronic cholecystitis with stones.  POSTOPERATIVE DIAGNOSIS:  Chronic cholecystectomy with stones.  OPERATION:  Laparoscopic cholecystectomy with cholangiogram.  ANESTHESIA:  General.  SURGEON:  Orson Ape. Rise Patience, M.D.  ASSISTANT:  Lew Dawes. Rosana Hoes, M.D.  HISTORY:  [name] is a 55 year old female, who presented to our emergency room after being referred from the [facility] where she had visited twice.  On the first attack, she had an ultrasound and was confirmed to have gallstones.  She said she was informed that the gallstone might pass.  I am not sure exactly what really advised, but anyway she kept having little episodes and then this past weekend, had a severe episode of pain, presented again to the [facility] Emergency Room and, this time, was referred to our office. They did lab studies which were unremarkable and when I saw her in the office, she was not in acute discomfort, even though she said she had had a milder episode the previous evening.  I recommended that we proceed on with a laparoscopic cholecystectomy, and arrangements were made to put her on the OR scheduled for this morning.  The patient is ALLERGIC to PENICILLIN, said that she has had a significant allergic reaction, and she has had a knee replacement recently, and I gave her 400 mg of Cipro intravenously preoperatively.  Her lab studies were unremarkable this morning.  DESCRIPTION OF PROCEDURE:  The patient was taken to the operative suite, induction of general anesthesia, endotracheal tube and oral tube into  the stomach, and then the abdomen was prepped with Betadine surgical scrub and solution and draped in a sterile manner.  The patient is quite heavy, and a small incision was made right at the umbilicus down, identifying the fascia, and then was grasped with two Kochers and a small opening made into the peritoneal cavity.  Two traction sutures were placed, and these were grasped and then the Hasson cannula introduced.  The gallbladder was not acutely inflamed.  There were adhesions around it, and upper 10 mm trocar was placed under direct vision after anesthetizing the fascia, and the two lateral 5 mm trocars were placed by Dr. Rosana Hoes.  We grasped the gallbladder and retracted upward and outward and then the adhesions to the duodenum and omentum were taken down carefully.  Good hemostasis.  There was a stone sort of impacted in the neck of the gallbladder, and the little cystic duct was quite short, but I was able to get around it carefully, placed a clip flush with the gallbladder cystic duct junction and a small opening made in the proximal cystic duct, a Cook catheter introduced and held in place with a clip.  X-ray was obtained, and it showed prompt filling in the extrahepatic biliary system with flow into the duodenum.  Catheter was removed.  The little short cystic duct remnant was triply clipped, divided.  The cystic artery was then identified and doubly clipped proximally, singly distally, and divided, and then the gallbladder freed up  from the bed with the hook electrocautery.  There was a little area probably to the inferior artery that was doubly clipped proximally and then the gallbladder freed from its bed.  Good hemostasis was obtained, and the gallbladder was grasped and brought out through the umbilical defect.  I placed another figure-of-eight suture in the fascia for a good closure and then anesthetized the fascia at the umbilicus with Marcaine with adrenalin also.  The  carbon dioxide was released.  The irrigant fluid had been aspirated, and I then closed the subcutaneous wounds with 4-0 Vicryl and Benzoin and Steri-Strips on the skin.  The patient tolerated the procedure nicely and was sent to the recovery room in a stable postoperative condition. Hopefully, she will be ready for discharge in the a.m. and probably will be off work this week. Dictated by:   Orson Ape. Rise Patience, M.D. Attending Physician:  Burnard Hawthorne DD:  11/14/01 TD:  11/14/01 Job: [id number]

## 2011-01-23 NOTE — Op Note (Signed)
NAMEANJELITA, [name]                 ACCOUNT NO.:  [id number]   MEDICAL RECORD NO.:  [id number]          PATIENT TYPE:  AMB   LOCATION:  DAY                          FACILITY[facility]  [facility]   PHYSICIAN:  Susa Day, M.D.    DATE OF BIRTH:  1955-11-28   DATE OF PROCEDURE:  06/19/2004  DATE OF DISCHARGE:                                 OPERATIVE REPORT   PREOPERATIVE DIAGNOSIS:  Arthrofibrosis right knee, status post right total  knee arthroplasty.   POSTOPERATIVE DIAGNOSIS:  Arthrofibrosis right knee, status post right total  knee arthroplasty.   PROCEDURE PERFORMED:  1.  Right knee manipulation under anesthesia.  2.  Right knee arthroscopy, synovectomy, debridement.   HISTORY AND INDICATIONS:  This is a 55 year old, status post knee  replacement, had been doing well following her knee replacement.  Had a fall  at work, had significant pain and swelling and contusion of the knee and  limited range of motion.  She developed a painful arthrofibrosis, following  that with a reduction in her range of motion.  Operative intervention was  indicated for manipulation under anesthesia, knee arthroscopy and  debridement in an effort to return her to her preaccident range of motion.  Risks and benefits discussed including bleeding, infection, no change in  symptoms, worsening symptoms, etc.   TECHNIQUE:  The patient in supine position.  After induction of adequate  general anesthesia and 1 g of Kefzol, examined under anesthesia.  She was  about -20-80 degrees.  Performed a gentle manipulation to the knee and was  able to achieve 90 degrees and -20 degrees.  I then proceeded with a knee  arthroscopy.  I used a superomedial parapatellar portal and a medial and  lateral parapatellar portal.  I introduced the cannula atraumatically in the  suprapatellar pouch and the plastic cannula in the lateral parapatellar  portal.  I lavaged the knee.  There was significant arthrofibrosis noted in  the knee and  prior to introduction of the arthroscopic camera and the  cannula, I used the blunt trocar and cannula and swept it in the  suprapatellar pouch beneath the quadriceps cephalad and caudad to lyse  adhesions.  Then with the arthroscopy and the 4.2 coude shaver, I performed  a generalized debridement of hypertrophic fibrotic tissue, seemed to be  invaginating within to the joint space and the suprapatellar pouch.  Following this debridement, I examined this extensive debridement.  I  examined the knee.  I examined the insert.  The insert was unremarkable.  There was some minor wear noted on that as well as the components.  There  was no evidence of loosening, and the patellar component was without  evidence of trauma as well.  Following this, I removed all instrumentation,  examined the knee.  We were able to -10 to 90 degrees of range of motion.   Next, I lavaged the knee.  All instrumentation was removed.  Portals were  closed with 4-0 nylon simple sutures.  Marcaine 0.25% with epinephrine was  infiltrated in the joint.  Wound was dressed sterilely.  She was  awakened  without difficulty and transported to the recovery room in satisfactory  condition.   The patient tolerated the procedure well with no complications.    Merry Proud  JB/MEDQ  D:  06/19/2004  T:  06/19/2004  Job:  [id number]

## 2011-01-23 NOTE — Op Note (Signed)
[facility] ([facility])  Patient:    [name], [name]                        MRN: [id number] Proc. Date: 03/26/00 Adm. Date:  [id number] Attending:  Tye Savoy                           Operative Report  PREOPERATIVE DIAGNOSIS:  Degenerative joint disease, right knee.  POSTOPERATIVE DIAGNOSIS:  Degenerative joint disease, right knee.  OPERATION PERFORMED:  Right total knee arthroplasty.  ANESTHESIA:  General.  SURGEON:  Dr. Tonita Cong.  ASSISTANT:  Dr. Shellia Carwin.  COMPONENTS UTILIZED:  Osteonics posterior cruciate retaining Scorpio knee replacement #7 tibia, #7 femoral component, 26 mm patella, 10 mm polyethylene insert.  BRIEF HISTORY:  A 55 year old with refractory knee pain secondary to grade 4 chondromalacia status post arthroscopic debridement anti-inflammatories, activity modification, and persistent pain symptomatology, operative intervention was indicated for placement of degenerated knee. The patient mainly had tibial chondromalacia of the medial and lateral compartment. The risks and benefits were discussed including bleeding, infection, damage to neurovascular structures, no change in symptoms, compound failure, DVT, PE, need for replacement in the future, etc.  TECHNIQUE:  The patient in the supine position after the induction of adequate general anesthesia and 1 gm of Kefzol IV for antimicrobial prophylaxis, the right lower extremity was prepped and draped and exsanguinated in a sterile fashion. Thigh tourniquet inflated to 350 mmHg. An anterior midline incision was made over the knee. The subcutaneous tissue was dissected, electrocautery utilized to achieve hemostasis. A median parapatellar arthrotomy was performed. The patella was everted, knee was flexed. There were grade 4 changes noted in the medial and lateral compartments and in the sulcus. No significant osteophytes noted. Synovial tissue was noted and this was excised. ACL was  excised. Degenerated menisci were noted and the medial and lateral menisci were excised. PCL was preserved and found to be intact. Next, a drill was utilized, the anterior femoral canal enlarged, irrigated and evacuated, intermedullary guide placed, distal femoral cutting jig applied. This was secured. An oscillating saw was utilized to perform a distal femoral cut. This was sized and sized first to a 9 and the 9 jig was placed and cuts were made and it was felt that a 7 would be more advantageous as the trial femoral component was significantly large and over size. A 7 cutting block was then applied, impacted and the anterior, posterior and chamfering cuts were made protecting the soft tissues at all times. The trial 7 was then placed and found to be satisfactory. Next, an oscillating saw was utilized to remove the tibial spine after the knee had been flexed 30 degrees and then subluxed with a smooth Mckale. Intermedullary guide placed into the tibia, external alignment guide placed, 6 mm cut off the defect laterally. After the appropriate rotation measured with an external alignment guide. This was a 5 degree posterior slope. This jig was secured to the tibia with pins. An oscillating saw was utilized to perform the proximal tibial cut. The PCL was spared. Excellent cut was obtained. It was not particularly sclerotic bone, however. The knee was copiously irrigated with a femoral trial component at the end. The tibial trial was placed, a #7 with a 10 insert and found to extend to full extension and flexion 140 without lift-off. No instability at 0 to 30 degrees. With the knee in full extension, the external  alignment guide was placed and the appropriate rotation marked on the tibia with an alignment guide dissecting the malleoli. Next, the patella was everted and measured to a 26. A patella jig was placed and reamed 10 mm in depth. The peg holes were then subsequently drilled and the trial  26 impacted into the recess, redundant cartilage and bone removed to a good flush region. This was then everted and with flexion extension with some lateral tracking, the lateral release was performed. Next, the trial components were removed, the knee was flexed, McKales placed preserving soft tissue and the step punch was utilized to place the fin guides into the proximal tibia in the appropriate eversion utilizing the cutting tower. Next, the wound was copiously irrigated, proximal tibia dried. Cement was centrifugated. Next to allowed to partially cure, placed in the proximal tibia and then a 7 permanent component was impacted on the tibia in appropriate eversion, redundant cement removed. A trial 10 was then placed, the tibia was reduced after the [facility] was removed. The end of the femur was dried, cement placed and on the posterior runners a 7 femur was then impacted an redundant cement removed. The knee was in full extension and actual load applied, redundant cement removed. The 26 permanent patella was then secured in the patella recess after cementing, the clamp applied, redundant cement removed. The knee in full extension, antibiotic irrigation, full curing of the cement was allowed. Following this, the trial insert was then removed, joint inspected, no evidence of residual osteophytes. Small areas of redundant cement were removed. The wound copiously irrigated. A 10 was found to fit satisfactory with full extension and flexion to 140 degrees. No instability. A permanent 10 polyethylene insert was then impacted and again ranged 0-140 range with known stability of 30 degrees. More formal lateral release was then performed utilizing electrocautery. Excellent alignment was noting. Good tracking. Next, a Hemovac was placed, brought in through the stab wound of the skin. The parapatellar incision was then closed with #1 Vicryl interrupted figure-of-eight sutures. Thrombin soaked Gelfoam  was placed in the lateral release region. The subcutaneous tissue was reapproximated with 2-0 Vicryl simple sutures, skin was reapproximated with staples and the wound was dressed  sterilely. The tourniquet was deflated with active vascularization of the lower extremity appreciated. The patient tolerated the procedure well with no complications. Tourniquet time was 210 minutes.  Blood loss was 100 cc. DD:  03/26/00 TD:  03/30/00 Job: [id number]

## 2011-01-23 NOTE — Discharge Summary (Signed)
[facility]  Patient:    [name], [name]                        MRN: [id number] Adm. Date:  [id number] Disc. Date: [id number] Attending:  Tye Savoy Dictator:   Charlott Rakes, P.A.                           Discharge Summary  ADMISSION DIAGNOSES: 1. Degenerative joint disease right knee. 2. Labile anxiety. 3. Anaphylactic reaction to penicillin.  DISCHARGE DIAGNOSES: 1. Degenerative joint disease right knee. 2. Labile anxiety. 3. Anaphylactic reaction to penicillin. 4. Posthemorrhagic anemia. 5. Proteinuria, resolved.  PROCEDURES:  On March 26, 2000, the patient underwent right total knee arthroplasty performed by Dr. Tonita Cong, assisted by Dr. Shellia Carwin, under general anesthesia.  CONSULTATIONS:  None.  HISTORY OF PRESENT ILLNESS:  [name] is a 55 year old white female with refractory right knee pain secondary to grade 4 chondromalacia noted on previous arthroscopic debridement of the right knee.  She has had little to no response to conservative treatment including anti-inflammatories, activity modification, and is now unable to get relief of her pain with analgesics. Due to these significant findings as well as continued symptoms, it was felt she would require surgical intervention and was admitted to undergo the procedure as stated above.  HOSPITAL COURSE:  The patient tolerated the procedure without difficulty. Postoperatively, neurovascular motor functions of the lower extremities was noted to be intact.  The patient was placed on the usual PCA analgesics postoperatively and weaned to p.o. analgesics without difficulty.  She did have muscle spasms which were quite frequent to the right knee and required IV Robaxin weaning to p.o. Robaxin without difficulties.  The patient was placed on the usual total knee replacement protocol for ambulation and gait training as well as range of motion and strengthening exercises.  She was  allowed weightbearing as tolerated on the operative extremity.  Range of motion was done actively by the physical therapist and passively with the CPM machine. Prior to discharge, the patient had walked as much as 60 and 75 feet.  Passive range of motion was to 70 degrees and active range of motion to 65 degrees. The patient received occupational therapy for ADLs and was independent with these prior to discharge.  Hemovac drain was removed from the wound on the first postoperative day, and dressing changes were done daily thereafter.  The patient had no signs of infection.  She had minimal edema of the wound throughout the remainder of the hospital stay.  The patients Foley catheter was discontinued on the third postoperative day, and she was voiding without difficulty.  She had been noted to have some sediment in her Foley bag on the second postoperative day, and a urinalysis was sent.  Her preoperative urinalysis was normal, with the exception of 1.0 urobilinogen.  The repeat on March 27, 2000, showed amber color, cloudy appearance, large hemoglobin, 100 protein, 1.0 urobilinogen, few epithelial cells, 21-50 rbcs, and 0-5 wbcs, with rare bacteria and yeast noted.  A repeat when the Foley catheter was discontinued once again showed amber, cloudy urine with 1+ urobilinogen, small leukocyte esterase, and rare bacteria.  There was no further protein noted. The patient had also been noted to have some mildly elevated glucose values during the hospital stay on chemistry studies.  These ranged from 101-131.  A hemoglobin A1C was drawn, with a normal value  of 5.4.  It was felt her elevated glucose was secondary to diet as well as IV fluids.  Other abnormality chemistry studies included hyponatremia at a value of 134 on March 28, 2000; however, repeat was normal on March 29, 2000.  The patient also had an episode of hypokalemia at 3.1 on March 29, 2000.  Urine cultures were negative.  An ______ , ANA,  and rheumatoid factor were drawn; however, at the time of this dictation, are pending.  These will be sent to Dr. Lawanda Cousins office for follow-up.  Coagulation studies on admission were normal and showed adequate signs of elevation while on Coumadin during the hospital stay.  The patient was placed on Coumadin postoperatively for DVT and PE prophylaxis and was monitored by the pharmacy throughout her hospital stay.  Hemoglobin postoperatively was 10.7 and prior to discharge was 9.0.  The patient did not require blood transfusion postoperatively.  On March 31, 2000, the patient was felt to be stable for discharge to her home.  PLAN:  The patient will be followed at her home by the home health physical therapist for the usual total knee replacement protocol with ambulation and gait training, weightbearing as tolerated, and range of motion and strengthening exercises.  She will continue to wear her knee immobilizer until she is able to do a straight leg raise.  She will utilize a walker for ambulation.  Dressing changes to be done daily at home.  The patient will be allowed to shower at home.  Weekly pro times will be drawn with the results telephoned to [facility] for adjustments in her Coumadin dosage. She will follow up with Dr. Tonita Cong two weeks from the date of her surgery. Prescriptions at discharge include: 1. Percocet #30 1-2 every four to six hours as needed for pain. 2. Robaxin #30 1 every eight hours as needed for spasm. 3. Coumadin per pharmacy protocol.  She will continue on a regular diet at home.  We will call her with the results of her pending laboratory studies.  She and her family have been advised to call the office if there are further questions or concerns prior to her return office visit. DD:  03/31/00 TD:  04/02/00 Job: [id number]

## 2011-01-23 NOTE — H&P (Signed)
[facility]  Patient:    [name], [name]                       MRN: [id number] Adm. Date:  03/26/00 Attending:  Johnn Hai, M.D. Dictator:   Elodia Florence. Clabe Seal, P.A. CC:         Billie-Lynn Bean, P.A.                         History and Physical  DATE OF BIRTH:  07-26-1956.  CHIEF COMPLAINT:  "Pain in my right knee."  PRESENT ILLNESS:  This is a 55 year old white female, seen by [place] for continuing problems concerning her knee.  The patient has had continuing problems for many months concerning her knee.  She has had increasing difficulty with activity and getting about.  In January of this year, she had a right knee arthroscopy and debridement of the right knee.  Unfortunately, she is continuing with pain and discomfort into the knee.  X-rays have shown continuing degeneration to the knee joint itself.  Examination reveals range of motion only to 90 degrees; she has patellofemoral pain with percussion as well.  Due to the fact the patient is refractory to conservative care, which has included arthroscopic debridement, weight reduction, medications, etc, it is felt that she would benefit from surgical intervention and is being scheduled for total knee replacement arthroplasty to the right knee.  PAST MEDICAL HISTORY:  The lady has a history of anxiety, for which she takes antianxiety medication.  She had trouble with Ativan in the past, with adverse effects.  She had TB exposure at 55 years of age.  SURGICAL HISTORY:  Two D&Cs.  A partial and then a total hysterectomy in 1976. She has had three orthopedic procedures to the right knee, with the last one mentioned above.  CURRENT MEDICATIONS:  Vicodin and occasional Advil.  FAMILY PRACTITIONER:  Her family practitioner is Billie-Lynn Bean, P.A.  ALLERGIES:  PENICILLIN, which causes severe anaphylactoid-type reaction, and DEMEROL.  She has adverse psychological reactions to  ATIVAN.  SOCIAL HISTORY:  The patient is married and smokes a half to one pack of cigarettes per day and is trying to stop.  She has no intake of alcoholic beverages.  FAMILY HISTORY:  Positive for end-stage lung disease, emphysema, congestive heart failure, heart disease, multiple sclerosis, Burkitts lymphoma, Gauchers syndrome, paranoid schizophrenic in her brother, arthritis in her mother, father, grandmother, and all five siblings exposed to TB in their youth.  REVIEW OF SYSTEMS:  CNS:  No seizure disorder, paralysis, numbness or double vision.  RESPIRATORY:  No productive cough.  No hemoptysis.  No shortness of breath.  CARDIOVASCULAR:  No chest pain.  No angina.  No orthopnea. GASTROINTESTINAL:  No nausea, vomiting, melena or bloody stools. GENITOURINARY:  No discharge, dysuria or hematuria.  MUSCULOSKELETAL: Primarily in present illness.  PHYSICAL EXAMINATION:  GENERAL:  This is an alert, cooperative, friendly, obese 55 year old white female whose vital signs are blood pressure 110/74, pulse 68 and respirations are 12.  HEENT:  Normocephalic.  Oropharynx is clear.  CHEST:  Clear to auscultation.  No rhonchi nor rales.  HEART:  Regular rate and rhythm.  No murmurs are heard.  ABDOMEN:  Soft and nontender.  Liver and spleen not felt.  GENITALIA/PELVIC:  Not done, not pertinent to present illness.  RECTAL:  Not done, not pertinent to present illness.  BREASTS:  Not done,  not pertinent to present illness.  EXTREMITIES:  Right knee as in present illness as above.  ADMITTING DIAGNOSES: 1. Degenerative joint disease, right knee. 2. Labile anxiety.  PLAN:  The patient will be admitted for right total knee replacement arthroplasty.  She has not donated any blood for this procedure.  After discussion with the patient, she would rather go home after her regular hospitalization, with home health equipment, etc.  Billie-Lynn Bean, P.A. is aware of this patients surgery.  We  will ask her group to follow along with [place], should we need any assistance medically in the hospital. DD:  03/18/00 TD:  03/18/00 Job: [id number]

## 2011-07-10 ENCOUNTER — Other Ambulatory Visit (HOSPITAL_COMMUNITY): Payer: Self-pay | Admitting: Family Medicine

## 2011-07-10 DIAGNOSIS — Z780 Asymptomatic menopausal state: Secondary | ICD-10-CM

## 2011-07-21 ENCOUNTER — Ambulatory Visit (HOSPITAL_COMMUNITY): Payer: Self-pay

## 2011-08-03 ENCOUNTER — Ambulatory Visit (HOSPITAL_COMMUNITY)
Admission: RE | Admit: 2011-08-03 | Discharge: 2011-08-03 | Disposition: A | Discharge status: Home / Self Care | Payer: Self-pay | Source: Ambulatory Visit | Attending: Family Medicine | Admitting: Family Medicine

## 2011-08-03 DIAGNOSIS — Z780 Asymptomatic menopausal state: Secondary | ICD-10-CM | POA: Insufficient documentation

## 2011-08-03 DIAGNOSIS — Z13820 Encounter for screening for osteoporosis: Secondary | ICD-10-CM | POA: Insufficient documentation

## 2011-10-17 ENCOUNTER — Emergency Department (HOSPITAL_COMMUNITY)
Admission: EM | Admit: 2011-10-17 | Discharge: 2011-10-17 | Disposition: A | Discharge status: Home / Self Care | Payer: Medicaid Other | Source: Home / Self Care | Attending: Emergency Medicine | Admitting: Emergency Medicine

## 2011-10-17 ENCOUNTER — Encounter (HOSPITAL_COMMUNITY): Payer: Self-pay | Admitting: *Deleted

## 2011-10-17 ENCOUNTER — Emergency Department (INDEPENDENT_AMBULATORY_CARE_PROVIDER_SITE_OTHER): Payer: Medicaid Other

## 2011-10-17 DIAGNOSIS — S300XXA Contusion of lower back and pelvis, initial encounter: Secondary | ICD-10-CM

## 2011-10-17 HISTORY — DX: Anemia, unspecified: D64.9

## 2011-10-17 HISTORY — DX: Essential (primary) hypertension: I10

## 2011-10-17 HISTORY — DX: Major depressive disorder, single episode, unspecified: F32.9

## 2011-10-17 HISTORY — DX: Depression, unspecified: F32.A

## 2011-10-17 HISTORY — DX: Obesity, unspecified: E66.9

## 2011-10-17 HISTORY — DX: Hypothyroidism, unspecified: E03.9

## 2011-10-17 MED ORDER — DICLOFENAC SODIUM 1 % TD GEL: 1.0000 "application " | Freq: Four times a day (QID) | TRANSDERMAL | Status: DC

## 2011-10-17 NOTE — ED Notes (Signed)
Fell 10 days ago injured lower back  Ice/head/stretching without relief

## 2011-10-17 NOTE — ED Notes (Signed)
Pt fell x 10 days ago low back pain - per pt injured leg which has improved - remains with low back pain pain when sitting - standing and walking some pain relief - per pt has tried head/ice/stretching without relief

## 2011-10-17 NOTE — ED Provider Notes (Signed)
History     CSN: [id number]  Arrival date & time 10/17/11  1458   First MD Initiated Contact with Patient 10/17/11 1542      Chief Complaint  Patient presents with  . Back Pain    (Consider location/radiation/quality/duration/timing/severity/associated sxs/prior treatment) HPI Comments: Patient states she had a mechanical fall 10 days ago, landing on her knee, hip, coccyx. Denies chest pain, palpitations, presyncope, syncope causing fall. No cervical, thoracic, lumbar pain. Denies any other injury. States that the hip pain has resolved, however, states the pain in her "tailbone" has gotten worse. Pain is achy, constant, nonradiating. Pain worse with walking around and sitting. Reports increased difficulty walking secondary to pain. No bruising, swelling. No paresthesias, lower extremity weakness. Has been applying ice, heat without relief. Patient has a history of osteoporosis.   ROS as noted in HPI. All other ROS negative.   Patient is a 56 y.o. female presenting with back pain. The history is provided by the patient. No language interpreter was used.  Back Pain  This is a new problem. The current episode started more than 1 week ago. The problem occurs constantly. The problem has been gradually worsening. The pain is associated with falling. The pain is present in the gluteal region. The pain does not radiate. She has tried heat, ice and walking for the symptoms. The treatment provided no relief. Risk factors include obesity and a history of osteoporosis.    Past Medical History  Diagnosis Date  . Osteoporosis   . Depression   . Hypertension   . Hypothyroid   . Hypercholesterolemia   . Anemia   . Obesity     Past Surgical History  Procedure Date  . Abdominal hysterectomy   . Total knee arthroplasty     Family History  Problem Relation Age of Onset  . Cancer Mother   . Diabetes Mother   . Cancer Father     History  Substance Use Topics  . Smoking status: Current  Everyday Smoker  . Smokeless tobacco: Not on file  . Alcohol Use: No    OB History    Grav Para Term Preterm Abortions TAB SAB Ect Mult Living                  Review of Systems  Musculoskeletal: Positive for back pain.    Allergies  Demerol and Penicillins  Home Medications   Current Outpatient Rx  Name Route Sig Dispense Refill  . ALENDRONATE SODIUM 10 MG PO TABS Oral Take by mouth every 7 (seven) days. Take with a full glass of water on an empty stomach.    . DULOXETINE HCL 20 MG PO CPEP Oral Take 60 mg by mouth daily.    Marland Kitchen HYDROCODONE-ACETAMINOPHEN 5-325 MG PO TABS Oral Take 1 tablet by mouth every 6 (six) hours as needed.    Marland Kitchen LISINOPRIL 20 MG PO TABS Oral Take 20 mg by mouth daily.    . MULTIVITAMINS PO CAPS Oral Take 1 capsule by mouth daily.    Marland Kitchen DICLOFENAC SODIUM 1 % TD GEL Topical Apply 1 application topically 4 (four) times daily. 100 g 0    BP 148/82  Pulse 90  Temp(Src) 98.5 F (36.9 C) (Oral)  Resp 22  SpO2 98%  Physical Exam  Nursing note and vitals reviewed. Constitutional: She is oriented to person, place, and time. She appears well-developed and well-nourished. No distress.  HENT:  Head: Normocephalic and atraumatic.  Eyes: Conjunctivae and EOM are normal.  Neck: Normal range of motion.  Cardiovascular: Normal rate.   Pulmonary/Chest: Effort normal.  Abdominal: She exhibits no distension.  Musculoskeletal: Normal range of motion.       Lumbar back: She exhibits normal pulse.       No cervical, thoracic, lumbar, SI joint tenderness. Tenderness over the coccyx. No crepitus, bruising noted. No tenderness along iliac crests. Hips nontender, no bruising noted on pelvis or leg.. Antalgic gait. Patient ambulating with a cane.  Neurological: She is alert and oriented to person, place, and time.  Skin: Skin is warm and dry.  Psychiatric: She has a normal mood and affect. Her behavior is normal. Judgment and thought content normal.    ED Course    Procedures (including critical care time)  Labs Reviewed - No data to display Dg Pelvis 1-2 Views  10/17/2011  *RADIOLOGY REPORT*  Clinical Data: [place] 10 days ago.  Tail bone is extremely sore. Hurts when sitting.  PELVIS - 1-2 VIEW  Comparison: None  Findings: There is marked deformity of the right femoral head, associated superior migration of the deformed head within the acetabulum.  There has been remodelling of the acetabulum and marked loss of the joint space.  There is sclerosis on both sides of the joint, combined with osteophyte formation.  Findings are most consistent with prior trauma versus infection.  The left hip is unremarkable in appearance.  There is no evidence for acute fracture.  Regional bowel gas pattern is nonobstructive.  IMPRESSION: Chronic changes in the right hip.  Original Report Authenticated By: Glenice Bow, M.D.   Dg Sacrum/coccyx  10/17/2011  *RADIOLOGY REPORT*  Clinical Data: [place] 10 days ago.  Tail bone is sore. Pain with sitting.  SACRUM AND COCCYX - 2+ VIEW  Comparison: AP pelvis 10/17/2011  Findings: AP views are performed of the sacral coccyx, showing no evidence for acute fracture or dislocation.  The frontal view detail is limited secondary to overlying bowel gas.  Partially seen is deformity of the right hip, better seen on the AP view of the pelvis of the same date.  IMPRESSION:  1. No evidence for acute  abnormality. 2.  Deformity of the right hip.  Original Report Authenticated By: Glenice Bow, M.D.     1. Coccygeal contusion    Dg Pelvis 1-2 Views  10/17/2011  *RADIOLOGY REPORT*  Clinical Data: [place] 10 days ago.  Tail bone is extremely sore. Hurts when sitting.  PELVIS - 1-2 VIEW  Comparison: None  Findings: There is marked deformity of the right femoral head, associated superior migration of the deformed head within the acetabulum.  There has been remodelling of the acetabulum and marked loss of the joint space.  There is sclerosis on both  sides of the joint, combined with osteophyte formation.  Findings are most consistent with prior trauma versus infection.  The left hip is unremarkable in appearance.  There is no evidence for acute fracture.  Regional bowel gas pattern is nonobstructive.  IMPRESSION: Chronic changes in the right hip.  Original Report Authenticated By: Glenice Bow, M.D.   Dg Sacrum/coccyx  10/17/2011  *RADIOLOGY REPORT*  Clinical Data: [place] 10 days ago.  Tail bone is sore. Pain with sitting.  SACRUM AND COCCYX - 2+ VIEW  Comparison: AP pelvis 10/17/2011  Findings: AP views are performed of the sacral coccyx, showing no evidence for acute fracture or dislocation.  The frontal view detail is limited secondary to overlying bowel gas.  Partially seen is deformity  of the right hip, better seen on the AP view of the pelvis of the same date.  IMPRESSION:  1. No evidence for acute  abnormality. 2.  Deformity of the right hip.  Original Report Authenticated By: Glenice Bow, M.D.    Imaging reviewed by myself. Report per radiologist.  MDM  Previous records reviewed. Per chart review patient has a history of A. fib, the patient states that she does not.    Check x-ray of coccyx/ sacrum, and pelvis as patient has history of advanced osteoporosis. Patient walks with a cane . Is on hydrocodone chronically. Will start anti-inflammatories, have her buy a donut pillow, and f/u with orthopedics.   Discussed results with patient. Will send home on Voltaren topical. Patient agrees with medical decision making, and plan. Will followup with Dr. Sharol Given in the next 10 days.  Cherly Beach, MD 10/17/11 [phone]

## 2012-02-02 DIAGNOSIS — B1920 Unspecified viral hepatitis C without hepatic coma: Secondary | ICD-10-CM | POA: Insufficient documentation

## 2012-02-02 DIAGNOSIS — M1990 Unspecified osteoarthritis, unspecified site: Secondary | ICD-10-CM | POA: Insufficient documentation

## 2012-08-22 ENCOUNTER — Encounter (HOSPITAL_COMMUNITY): Payer: Self-pay

## 2012-08-22 ENCOUNTER — Emergency Department (HOSPITAL_COMMUNITY)
Admission: EM | Admit: 2012-08-22 | Discharge: 2012-08-22 | Disposition: A | Discharge status: Home / Self Care | Payer: No Typology Code available for payment source | Source: Home / Self Care

## 2012-08-22 DIAGNOSIS — M1990 Unspecified osteoarthritis, unspecified site: Secondary | ICD-10-CM

## 2012-08-22 DIAGNOSIS — I10 Essential (primary) hypertension: Secondary | ICD-10-CM

## 2012-08-22 DIAGNOSIS — F329 Major depressive disorder, single episode, unspecified: Secondary | ICD-10-CM

## 2012-08-22 DIAGNOSIS — G894 Chronic pain syndrome: Secondary | ICD-10-CM

## 2012-08-22 DIAGNOSIS — Z0000 Encounter for general adult medical examination without abnormal findings: Secondary | ICD-10-CM

## 2012-08-22 MED ORDER — LISINOPRIL 20 MG PO TABS: 40.0000 mg | ORAL_TABLET | Freq: Every day | ORAL | Status: DC

## 2012-08-22 MED ORDER — OXYCODONE-ACETAMINOPHEN 5-325 MG PO TABS: 1.0000 | ORAL_TABLET | Freq: Three times a day (TID) | ORAL | Status: DC | PRN

## 2012-08-22 MED ORDER — OXYCODONE HCL 5 MG PO TABS
5.0000 mg | ORAL_TABLET | Freq: Three times a day (TID) | ORAL | Status: DC | PRN
Start: 2012-08-22 — End: 2012-08-29

## 2012-08-22 MED ORDER — HYDROCHLOROTHIAZIDE 25 MG PO TABS: 25.0000 mg | ORAL_TABLET | Freq: Every day | ORAL | Status: DC

## 2012-08-22 NOTE — ED Notes (Signed)
[facility] client- needs medication refill

## 2012-08-22 NOTE — ED Provider Notes (Signed)
History     CSN: [id number]  Arrival date & time 08/22/12  1731   First MD Initiated Contact with Patient 08/22/12 1912      Chief Complaint  Patient presents with  . Medication Refill    HPI Patient is a 56 year old female with a past medical history of chronic hepatitis B, hypertension, chronic pain syndrome, severe osteoarthritis, who presents today to establish primary care. Patient has been in her regular state of health, she continues to have chronic and severe low back pain, bilateral hip pain and right knee pain from severe degenerative joint disease. She claims she has been a orthopedic doctor at [facility] at this point she is deemed not to be operative candidate because of osteoporosis. She claims she has been dependent on narcotics for the past number of years, however since the [facility] closed, she gradually wean herself off narcotics. She however now is in persistent pain, she rates the pain at 8/10, is currently very tearful as his pain is interfering with her daily activities of living. She at this time requests that we restart her narcotics. She currently denies any other complaints like fever, nausea, vomiting, diarrhea.  Past Medical History  Diagnosis Date  . Osteoporosis   . Depression   . Hypertension   . Hypothyroid   . Hypercholesterolemia   . Anemia   . Obesity     Past Surgical History  Procedure Date  . Abdominal hysterectomy   . Total knee arthroplasty     Family History  Problem Relation Age of Onset  . Cancer Mother   . Diabetes Mother   . Cancer Father     History  Substance Use Topics  . Smoking status: Current Every Day Smoker  . Smokeless tobacco: Not on file  . Alcohol Use: No    OB History    Grav Para Term Preterm Abortions TAB SAB Ect Mult Living                  Review of Systems No fever No chest pain Shortness of breath No abdominal pain + Back pain-lower + Bilateral hip pain No dysuria No  diarrhea No nausea vomiting  Allergies  Demerol and Penicillins  Home Medications   Current Outpatient Rx  Name  Route  Sig  Dispense  Refill  . SERTRALINE HCL 50 MG PO TABS   Oral   Take 50 mg by mouth daily.         Marland Kitchen HYDROCHLOROTHIAZIDE 25 MG PO TABS   Oral   Take 1 tablet (25 mg total) by mouth daily.   30 tablet   0   . LISINOPRIL 20 MG PO TABS   Oral   Take 2 tablets (40 mg total) by mouth daily.   30 tablet   2   . OXYCODONE HCL 5 MG PO TABS   Oral   Take 1 tablet (5 mg total) by mouth every 8 (eight) hours as needed for pain.   30 tablet   0     BP 126/56  Pulse 80  Temp 98.8 F (37.1 C) (Oral)  Resp 20  SpO2 97%  Physical Exam General exam-awake alert, not in any distress HEENT-atraumatic normocephalic, pupils equally reactive to light and accommodation. Neck-supple Chest-bilaterally clear to auscultation CVS-S1-S2 regular, no murmurs heard. Abdomen-soft nontender nondistended Extremities-no edema, chronic varicose veins seen. Neurology-awake alert, and no focalities.    ED Course  Procedures (including critical care time)  Labs Reviewed - No data  to display No results found.   1. Routine health maintenance   2. DEGENERATIVE JOINT DISEASE   3. HTN (hypertension)   4. Depression   5. Pain syndrome, chronic    Hypertension - Currently controlled with lisinopril, we'll continue  Degenerative joint disease/chronic pain syndrome - Is interested in pursuing pain contract, she claims that over the counter Tylenol and nonsteroidal anti-inflammatory medications are not helping her pain. Although [facility], she did not take any of her narcotics-but her pain has been interfering with her daily activities of living, currently is very tearful and once this pain under control. For now I will give her short acting oxycodone, if she is compliant with medications and followup, she would be a good candidate to establish a pain  contract and see how she does. For now we will have her followup in 2 weeks, and see if we can be relationship with her. Pain contract was briefly discussed, and she is agreeable.  Depression - Continued Zoloft  History of hepatitis C - Check  viral load, and may need referral to the hepatitis clinic  Gen. health maintenance - Will check for compliance with followup and medications, before beginning general health maintenance-including cancer screening, osteoporosis screening and vaccinations.  Patient claims that she has prior documentation of orthopedic issues at home, I have asked her to bring all the documentation in her next visit.    MDM  Return to clinic in 2 weeks-lab work prior to next visit        Jonetta Osgood, MD 08/22/12 1956

## 2012-08-29 ENCOUNTER — Emergency Department (HOSPITAL_COMMUNITY)
Admission: EM | Admit: 2012-08-29 | Discharge: 2012-08-29 | Disposition: A | Discharge status: Home / Self Care | Payer: No Typology Code available for payment source | Source: Home / Self Care

## 2012-08-29 ENCOUNTER — Encounter (HOSPITAL_COMMUNITY): Payer: Self-pay

## 2012-08-29 DIAGNOSIS — F329 Major depressive disorder, single episode, unspecified: Secondary | ICD-10-CM

## 2012-08-29 DIAGNOSIS — I10 Essential (primary) hypertension: Secondary | ICD-10-CM

## 2012-08-29 DIAGNOSIS — B182 Chronic viral hepatitis C: Secondary | ICD-10-CM

## 2012-08-29 DIAGNOSIS — G8929 Other chronic pain: Secondary | ICD-10-CM

## 2012-08-29 DIAGNOSIS — M1990 Unspecified osteoarthritis, unspecified site: Secondary | ICD-10-CM

## 2012-08-29 MED ORDER — HYDROCHLOROTHIAZIDE 25 MG PO TABS: 25.0000 mg | ORAL_TABLET | Freq: Every day | ORAL | Status: DC

## 2012-08-29 MED ORDER — SERTRALINE HCL 50 MG PO TABS: 50.0000 mg | ORAL_TABLET | Freq: Every day | ORAL | Status: DC

## 2012-08-29 MED ORDER — OXYCODONE HCL 5 MG PO TABS: 5.0000 mg | ORAL_TABLET | Freq: Three times a day (TID) | ORAL | Status: DC | PRN

## 2012-08-29 MED ORDER — POLYETHYLENE GLYCOL 3350 17 G PO PACK: 17.0000 g | PACK | Freq: Every day | ORAL | Status: DC

## 2012-08-29 NOTE — ED Provider Notes (Signed)
Patient Demographics  [name], is a 56 y.o. female  [id number]  [id number]  DOB - 11-02-1955  Chief Complaint  Patient presents with  . Follow-up        Subjective:   [name] today is here for a follow up vist. Patient has No headache, No chest pain, No abdominal pain - No Nausea, No new weakness tingling or numbness, No Cough - SOB. Patient has chronic pain syndrome, she has chronic right and left hip deformity, and as a result of this needs chronic narcotics. We started oxycodone last visit, we talked about pain contract, ever since starting up she could, she claims she her pain is significantly well controlled. She claims she actually walked her apartment/house for the very first time in the past numerous months today. She was previously being followed by orthopedics at [facility], she was also seen hepatitis clinic as well.Lab have been ordered, these are currently pending.   Objective:    Filed Vitals:   08/29/12 1805  BP: 126/83  Pulse: 77  Temp: 98.9 F (37.2 C)  TempSrc: Oral  Resp: 18  SpO2: 95%     ALLERGIES:   Allergies  Allergen Reactions  . Demerol   . Penicillins     PAST MEDICAL HISTORY: Past Medical History  Diagnosis Date  . Osteoporosis   . Depression   . Hypertension   . Hypothyroid   . Hypercholesterolemia   . Anemia   . Obesity     PAST SURGICAL HISTORY: Past Surgical History  Procedure Date  . Abdominal hysterectomy   . Total knee arthroplasty     FAMILY HISTORY: Family History  Problem Relation Age of Onset  . Cancer Mother   . Diabetes Mother   . Cancer Father     MEDICATIONS AT HOME: Prior to Admission medications   Medication Sig Start Date End Date Taking? Authorizing Provider  hydrochlorothiazide (HYDRODIURIL) 25 MG tablet Take 1 tablet (25 mg total) by mouth daily. 08/29/12   Demarius Archila Kristeen Mans, MD  lisinopril (PRINIVIL,ZESTRIL) 20 MG tablet Take 2 tablets (40 mg total) by mouth daily. 08/22/12   Ytzel Gubler Kristeen Mans, MD  oxyCODONE (ROXICODONE) 5 MG immediate release tablet Take 1 tablet (5 mg total) by mouth every 8 (eight) hours as needed for pain. 08/29/12   Jaylaa Gallion Kristeen Mans, MD  polyethylene glycol [facility]) packet Take 17 g by mouth daily. 08/29/12   Langdon Crosson Kristeen Mans, MD  sertraline (ZOLOFT) 50 MG tablet Take 1 tablet (50 mg total) by mouth daily. 08/29/12   Nylan Nakatani Kristeen Mans, MD    Exam  General appearance :Awake, alert, not in any distress. Speech Clear. Not toxic Looking HEENT: Atraumatic and Normocephalic, pupils equally reactive to light and accomodation Neck: supple, no JVD. No cervical lymphadenopathy.  Chest:Good air entry bilaterally, no added sounds  CVS: S1 S2 regular, no murmurs.  Abdomen: Bowel sounds present, Non tender and not distended with no gaurding, rigidity or rebound. Extremities: B/L Lower Ext shows no edema, both legs are warm to touch Neurology: Awake alert, and oriented X 3, CN II-XII intact, Non focal Skin:No Rash Wounds:N/A    Data Review   CBC No results found for this basename: WBC:5,HGB:5,HCT:5,PLT:5,MCV:5,MCH:5,MCHC:5,RDW:5,NEUTRABS:5,LYMPHSABS:5,MONOABS:5,EOSABS:5,BASOSABS:5,BANDABS:5,BANDSABD:5 in the last 168 hours  Chemistries   No results found for this basename: NA:5,K:5,CL:5,CO2:5,GLUCOSE:5,BUN:5,CREATININE:5,GFRCGP,:5,CALCIUM:5,MG:5,AST:5,ALT:5,ALKPHOS:5,BILITOT:5 in the last 168 hours ------------------------------------------------------------------------------------------------------------------ No results found for this basename: HGBA1C:2 in the last 72 hours ------------------------------------------------------------------------------------------------------------------ No results found for this basename: CHOL:2,HDL:2,LDLCALC:2,TRIG:2,CHOLHDL:2,LDLDIRECT:2 in the last 72 hours ------------------------------------------------------------------------------------------------------------------ No results  found for this basename:  TSH,T4TOTAL,FREET3,T3FREE,THYROIDAB in the last 72 hours ------------------------------------------------------------------------------------------------------------------ No results found for this basename: VITAMINB12:2,FOLATE:2,FERRITIN:2,TIBC:2,IRON:2,RETICCTPCT:2 in the last 72 hours  Coagulation profile  No results found for this basename: INR:5,PROTIME:5 in the last 168 hours    Assessment & Plan   Chronic pain syndrome  - Patient's pain is very well controlled with oxycodone 5 mg 3 times a day as needed. She has had a chance to review the pain contract, and has actually signed it. Placed on MiraLax once a day. We will give her 3 week supply today.    chronic hepatitis C  - Check a viral load, and if possible will need referral to the hepatology clinic.   Hypertension - Controlled with hydrochlorothiazide and lisinopril.  Osteoarthritis/ pelvic deformity - Pain control with oxycodone - Will need referral to the orthopedic clinic-previously used to see orthopedics at [facility].  She will need blood work-prior to her next visit.   Follow-up Information    Follow up with [facility]. Schedule an appointment as soon as possible for a visit in 2 weeks.          Jonetta Osgood, MD 08/29/12 [phone]

## 2012-08-29 NOTE — ED Notes (Signed)
Patient states here  for follow up per dr. Sloan Leiter

## 2012-09-01 ENCOUNTER — Emergency Department (HOSPITAL_COMMUNITY)
Admission: EM | Admit: 2012-09-01 | Discharge: 2012-09-01 | Disposition: A | Discharge status: Home / Self Care | Payer: No Typology Code available for payment source | Source: Home / Self Care

## 2012-09-01 DIAGNOSIS — E039 Hypothyroidism, unspecified: Secondary | ICD-10-CM

## 2012-09-01 LAB — COMPREHENSIVE METABOLIC PANEL
Albumin: 3.1 g/dL — ABNORMAL LOW (ref 3.5–5.2)
BUN: 7 mg/dL (ref 6–23)
Creatinine, Ser: 0.58 mg/dL (ref 0.50–1.10)
Total Bilirubin: 0.5 mg/dL (ref 0.3–1.2)
Total Protein: 7.8 g/dL (ref 6.0–8.3)

## 2012-09-01 LAB — CHOLESTEROL, TOTAL: Cholesterol: 140 mg/dL (ref 0–200)

## 2012-09-01 LAB — CBC
MCHC: 33.4 g/dL (ref 30.0–36.0)
RDW: 13.9 % (ref 11.5–15.5)

## 2012-09-01 LAB — HEMOGLOBIN A1C: Hgb A1c MFr Bld: 5.7 % — ABNORMAL HIGH (ref <5.7)

## 2012-09-02 LAB — HEPATITIS PANEL, ACUTE
HCV Ab: REACTIVE — AB
Hep B C IgM: NEGATIVE
Hepatitis B Surface Ag: NEGATIVE

## 2012-09-08 ENCOUNTER — Telehealth (HOSPITAL_COMMUNITY): Payer: Self-pay

## 2012-09-08 NOTE — Progress Notes (Signed)
I reviewed labs and reported results to nurse and asked nurse to call patient with results which she did successfully.  Pt should have labs rechecked in 3-4 months.  Gerlene Fee, MD, CDE, [place], [place]

## 2012-09-27 ENCOUNTER — Emergency Department (HOSPITAL_COMMUNITY)
Admission: EM | Admit: 2012-09-27 | Discharge: 2012-09-27 | Disposition: A | Discharge status: Home / Self Care | Payer: No Typology Code available for payment source | Source: Home / Self Care

## 2012-09-27 ENCOUNTER — Encounter (HOSPITAL_COMMUNITY): Payer: Self-pay

## 2012-09-27 DIAGNOSIS — D649 Anemia, unspecified: Secondary | ICD-10-CM

## 2012-09-27 DIAGNOSIS — I4891 Unspecified atrial fibrillation: Secondary | ICD-10-CM

## 2012-09-27 DIAGNOSIS — D696 Thrombocytopenia, unspecified: Secondary | ICD-10-CM

## 2012-09-27 DIAGNOSIS — E039 Hypothyroidism, unspecified: Secondary | ICD-10-CM

## 2012-09-27 DIAGNOSIS — M1990 Unspecified osteoarthritis, unspecified site: Secondary | ICD-10-CM

## 2012-09-27 MED ORDER — OXYCODONE HCL 5 MG PO TABS: 5.0000 mg | ORAL_TABLET | Freq: Three times a day (TID) | ORAL | Status: DC | PRN

## 2012-09-27 NOTE — ED Notes (Signed)
Patient states here for some blood results and needs pain medication refill

## 2012-09-27 NOTE — ED Provider Notes (Signed)
History     CSN: [id number]  Arrival date & time 09/27/12  1533  Chief Complaint  Patient presents with  . Follow-up   HPI Patient is presenting today to followup on the lab results from her recent tests.  She has been following her hepatitis C following a viral loads.  She usually has a yearly viral load tested and she thought she had a test in December but there was no result.  The patient says that she's planning to see a hepatologist when she gets her Medicaid benefits in the next several months.  She says that she's not going to pursue any further hepatitis C treatment until she gets her Medicaid benefits.  She is requesting refills on her pain medication.  She reports that the recent changes to her pain medication has helped her symptoms and help her to function more regularly.  She's taking immediate release oxycodone 3 times a day.  Past Medical History  Diagnosis Date  . Osteoporosis   . Depression   . Hypertension   . Hypothyroid   . Hypercholesterolemia   . Anemia   . Obesity     Past Surgical History  Procedure Date  . Abdominal hysterectomy   . Total knee arthroplasty     Family History  Problem Relation Age of Onset  . Cancer Mother   . Diabetes Mother   . Cancer Father     History  Substance Use Topics  . Smoking status: Current Every Day Smoker  . Smokeless tobacco: Not on file  . Alcohol Use: No    OB History    Grav Para Term Preterm Abortions TAB SAB Ect Mult Living                 Review of Systems  Musculoskeletal: Positive for back pain, joint swelling, arthralgias and gait problem.  All other systems reviewed and are negative.    Allergies  Demerol and Penicillins  Home Medications   Current Outpatient Rx  Name  Route  Sig  Dispense  Refill  . HYDROCHLOROTHIAZIDE 25 MG PO TABS   Oral   Take 1 tablet (25 mg total) by mouth daily.   30 tablet   2   . LISINOPRIL 20 MG PO TABS   Oral   Take 2 tablets (40 mg total) by mouth  daily.   30 tablet   2   . OXYCODONE HCL 5 MG PO TABS   Oral   Take 1 tablet (5 mg total) by mouth every 8 (eight) hours as needed for pain.   90 tablet   0   . POLYETHYLENE GLYCOL 3350 PO PACK   Oral   Take 17 g by mouth daily.   14 each   2   . SERTRALINE HCL 50 MG PO TABS   Oral   Take 1 tablet (50 mg total) by mouth daily.   30 tablet   2     BP 133/69  Pulse 66  Temp 98 F (36.7 C) (Oral)  Resp 18  SpO2 98%  Physical Exam  Nursing note and vitals reviewed. Constitutional: She is oriented to person, place, and time. She appears well-developed and well-nourished. No distress.  HENT:  Head: Normocephalic and atraumatic.  Eyes: EOM are normal. Pupils are equal, round, and reactive to light.  Neck: Normal range of motion. Neck supple. No JVD present.  Cardiovascular: Normal rate, regular rhythm and normal heart sounds.   Pulmonary/Chest: Effort normal and breath sounds normal.  No respiratory distress. She has no wheezes. She exhibits no tenderness.  Abdominal: Soft. Bowel sounds are normal. She exhibits no distension and no mass. There is no rebound and no guarding.  Musculoskeletal: She exhibits tenderness.       Patient walks with a cane and has a pronounced limp  Lymphadenopathy:    She has no cervical adenopathy.  Neurological: She is alert and oriented to person, place, and time. She has normal reflexes.  Skin: Skin is warm and dry.  Psychiatric: She has a normal mood and affect. Her behavior is normal. Judgment and thought content normal.    ED Course  Procedures (including critical care time)  Labs Reviewed - No data to display No results found.   1. HYPOTHYROIDISM   2. Atrial fibrillation   3. THROMBOCYTOPENIA   4. ANEMIA   5. DEGENERATIVE JOINT DISEASE     MDM  IMPRESSION  Chronic Pain DJD  Hep C  Anemia   Afib  Thrombocytopenia  RECOMMENDATIONS / PLAN Pt reports that she was having better pain control with oxycodone 5 mg po tid  prn pain, refill that prescription for her today as requested. The patient will call next month to get another refill which we will write for her to come and pick up. The patient declined to have a hepatitis C viral load drawn today. I reviewed the labs drawn in December with her today.  FOLLOW UP 2 months for recheck  The patient was given clear instructions to go to ER or return to medical center if symptoms don't improve, worsen or new problems develop.  The patient verbalized understanding.  The patient was told to call to get lab results if they haven't heard anything in the next week.           Murlean Iba, MD 09/27/12 [phone]

## 2012-10-06 ENCOUNTER — Emergency Department (HOSPITAL_COMMUNITY)
Admission: EM | Admit: 2012-10-06 | Discharge: 2012-10-06 | Disposition: A | Discharge status: Home / Self Care | Payer: No Typology Code available for payment source | Attending: Emergency Medicine | Admitting: Emergency Medicine

## 2012-10-06 ENCOUNTER — Emergency Department (HOSPITAL_COMMUNITY): Payer: No Typology Code available for payment source

## 2012-10-06 DIAGNOSIS — F17200 Nicotine dependence, unspecified, uncomplicated: Secondary | ICD-10-CM | POA: Insufficient documentation

## 2012-10-06 DIAGNOSIS — F3289 Other specified depressive episodes: Secondary | ICD-10-CM | POA: Insufficient documentation

## 2012-10-06 DIAGNOSIS — R0789 Other chest pain: Secondary | ICD-10-CM

## 2012-10-06 DIAGNOSIS — I10 Essential (primary) hypertension: Secondary | ICD-10-CM | POA: Insufficient documentation

## 2012-10-06 DIAGNOSIS — E039 Hypothyroidism, unspecified: Secondary | ICD-10-CM | POA: Insufficient documentation

## 2012-10-06 DIAGNOSIS — Y92009 Unspecified place in unspecified non-institutional (private) residence as the place of occurrence of the external cause: Secondary | ICD-10-CM | POA: Insufficient documentation

## 2012-10-06 DIAGNOSIS — F329 Major depressive disorder, single episode, unspecified: Secondary | ICD-10-CM | POA: Insufficient documentation

## 2012-10-06 DIAGNOSIS — Z79899 Other long term (current) drug therapy: Secondary | ICD-10-CM | POA: Insufficient documentation

## 2012-10-06 DIAGNOSIS — Y9389 Activity, other specified: Secondary | ICD-10-CM | POA: Insufficient documentation

## 2012-10-06 DIAGNOSIS — M810 Age-related osteoporosis without current pathological fracture: Secondary | ICD-10-CM | POA: Insufficient documentation

## 2012-10-06 DIAGNOSIS — Z862 Personal history of diseases of the blood and blood-forming organs and certain disorders involving the immune mechanism: Secondary | ICD-10-CM | POA: Insufficient documentation

## 2012-10-06 DIAGNOSIS — E7800 Pure hypercholesterolemia, unspecified: Secondary | ICD-10-CM | POA: Insufficient documentation

## 2012-10-06 DIAGNOSIS — R071 Chest pain on breathing: Secondary | ICD-10-CM | POA: Insufficient documentation

## 2012-10-06 DIAGNOSIS — W1809XA Striking against other object with subsequent fall, initial encounter: Secondary | ICD-10-CM | POA: Insufficient documentation

## 2012-10-06 NOTE — Progress Notes (Signed)
[facility] ED CM noted pt listed with a previous [facility] (wilson) CM spoke with pt who confirms she is self pay but has recently started to see Dr Irwin Brakeman at the [facility] for services EPIC updated

## 2012-10-06 NOTE — ED Notes (Signed)
Bed:WA24<BR> Expected date:<BR> Expected time:<BR> Means of arrival:<BR> Comments:<BR> ems

## 2012-10-06 NOTE — ED Notes (Signed)
Patient transported from home by EMS,  Patient fell against a dresser last week, the next day she started having right sided rib pain and right arm pain.  Pain worse with movement and inspiration.  No discoloration or deformity noted.  Shallow respirations.  Patient has history of osteoporosis.

## 2012-10-06 NOTE — ED Notes (Signed)
Patient takes Oxycodone 5mg  3x per day.  Last dose 1/2 pill at 2:30pm today.

## 2012-10-06 NOTE — ED Provider Notes (Signed)
History     CSN: [id number]  Arrival date & time 10/06/12  1611   First MD Initiated Contact with Patient 10/06/12 1728      No chief complaint on file.   (Consider location/radiation/quality/duration/timing/severity/associated sxs/prior treatment) HPI  57 year old female presents complaining of chest wall pain. Patient reports she fell against a dresser on Monday and hits the right side of her ribs against the dresser. Sts she was using her cane but loss balance.  She had a controlled fall in order to avoid hitting her head or her hip.  She did landed on the floor with her R forearm breaking the fall. She denies hitting head loss of consciousness. Denies any significant pain to R forearm or elbow.  Does notice pain to R anterior chest, sharp throbbing in nature, worsen with taking deep breath or with movement.  Has been taking her pain medication with some relief.  Denies any precipitating sxs prior to fall. Denies increased SOB or hemoptysis.  Past Medical History  Diagnosis Date  . Osteoporosis   . Depression   . Hypertension   . Hypothyroid   . Hypercholesterolemia   . Anemia   . Obesity     Past Surgical History  Procedure Date  . Abdominal hysterectomy   . Total knee arthroplasty     Family History  Problem Relation Age of Onset  . Cancer Mother   . Diabetes Mother   . Cancer Father     History  Substance Use Topics  . Smoking status: Current Every Day Smoker  . Smokeless tobacco: Not on file  . Alcohol Use: No    OB History    Grav Para Term Preterm Abortions TAB SAB Ect Mult Living                  Review of Systems  Constitutional:       10 Systems reviewed and all are negative for acute change except as noted in the HPI.     Allergies  Demerol and Penicillins  Home Medications   Current Outpatient Rx  Name  Route  Sig  Dispense  Refill  . LISINOPRIL 40 MG PO TABS   Oral   Take 40 mg by mouth daily.         . OXYCODONE HCL 5 MG PO  TABS   Oral   Take 1 tablet (5 mg total) by mouth every 8 (eight) hours as needed for pain.   90 tablet   0   . SERTRALINE HCL 50 MG PO TABS   Oral   Take 75 mg by mouth at bedtime.           BP 119/53  Pulse 66  Temp 98.3 F (36.8 C) (Oral)  Resp 24  Wt 283 lb (128.368 kg)  SpO2 93%  Physical Exam  Nursing note and vitals reviewed. Constitutional: She is oriented to person, place, and time. She appears well-developed and well-nourished. No distress.       Morbidly obese  HENT:  Head: Normocephalic and atraumatic.  Eyes: Conjunctivae normal are normal.  Neck: Normal range of motion. Neck supple.  Cardiovascular: Normal rate and regular rhythm.   Pulmonary/Chest: Effort normal. She exhibits tenderness (Chaperone present:  Tenderness along the anterior inferior rib line below breast without crepitus, bruising, or deformity noted.).  Abdominal: Soft. Bowel sounds are normal. There is no tenderness.  Musculoskeletal: She exhibits tenderness (Mild tenderness to anterior forearm without deformity noted. Normal right elbow and right  wrist range of motion).  Neurological: She is alert and oriented to person, place, and time.  Skin: Skin is warm. No rash noted.  Psychiatric: She has a normal mood and affect.    ED Course  Procedures (including critical care time)  Labs Reviewed - No data to display Dg Chest 2 View  10/06/2012  *RADIOLOGY REPORT*  Clinical Data: Fall, right rib pain  CHEST - 2 VIEW  Comparison: Prior chest x-ray 03/10/2009  Findings: Very low inspiratory volumes with bibasilar atelectasis. Mild pulmonary vascular congestion without overt edema.  Unchanged cardiomegaly.  Extremely limited evaluation of the ribs secondary to patient body habitus and positioning.  No obvious deformed fracture.  No pneumothorax or pleural effusion.  Surgical clips the right upper quadrant suggest prior cholecystectomy.  IMPRESSION:  1.  Low inspiratory volumes with bibasilar atelectasis  and mild pulmonary vascular congestion. 2.  Stable cardiomegaly. 3.  Limited evaluation of the ribs reveals no obvious displaced fracture.   Original Report Authenticated By: Jacqulynn Cadet, M.D.      No diagnosis found.  6:11 PM Patient presents for evaluations of a fall and right anterior chest wall pain. X-ray shows no evidence of rib fracture, pulmonary contusion, or any other abnormalities. Patient has pain medication at home. I will offer patient an incentive spirometer and encourage its use to decrease the risk of pneumonia. Patient to followup with her PCP for further management. Rice therapy discussed.  1. Chest wall pain MDM  BP 119/53  Pulse 66  Temp 98.3 F (36.8 C) (Oral)  Resp 24  Wt 283 lb (128.368 kg)  SpO2 93%  I have reviewed nursing notes and vital signs. I personally reviewed the imaging tests through PACS system  I reviewed available ER/hospitalization records thought the EMR         Domenic Moras, [place] 10/06/12 1813

## 2012-10-07 NOTE — ED Provider Notes (Signed)
Medical screening examination/treatment/procedure(s) were performed by non-physician practitioner and as supervising physician I was immediately available for consultation/collaboration.  Carmin Muskrat, MD 10/07/12 [phone]

## 2012-10-26 MED ORDER — OXYCODONE HCL 5 MG PO TABS: 5.0000 mg | ORAL_TABLET | Freq: Three times a day (TID) | ORAL | Status: DC | PRN

## 2012-10-26 NOTE — Progress Notes (Signed)
Pt called requesting refill of her chronic pain medication.  Pt is due for refill on 10/28/12.  I wrote prescription today with instructions to pharmacy to not refill before 10/28/12.   Pt to follow up in 1 month for office visit.     Gerlene Fee, MD, CDE, [place], [place]

## 2012-11-17 ENCOUNTER — Encounter (HOSPITAL_COMMUNITY): Payer: Self-pay

## 2012-11-17 ENCOUNTER — Emergency Department (HOSPITAL_COMMUNITY)
Admission: EM | Admit: 2012-11-17 | Discharge: 2012-11-17 | Disposition: A | Discharge status: Home / Self Care | Payer: No Typology Code available for payment source | Source: Home / Self Care

## 2012-11-17 DIAGNOSIS — M1990 Unspecified osteoarthritis, unspecified site: Secondary | ICD-10-CM

## 2012-11-17 MED ORDER — OXYCODONE HCL 5 MG PO TABS: 5.0000 mg | ORAL_TABLET | Freq: Three times a day (TID) | ORAL | Status: DC | PRN

## 2012-11-17 NOTE — ED Notes (Signed)
Patient here for medication refill Also fell about 5 weeks ago was seen at [facility]-having trouble with her right arm Lately blood pressure has been fluxuating up and down

## 2012-12-26 MED ORDER — OXYCODONE HCL 5 MG PO TABS: 5.0000 mg | ORAL_TABLET | Freq: Three times a day (TID) | ORAL | Status: DC | PRN

## 2012-12-26 NOTE — Progress Notes (Signed)
Patient came to the clinic for refill for oxycodone. She normally sees Dr. Wynetta Emery for her medication refills. She says she will run out  Of her medications from tomorrow.  i will give her refill until 4/24 when she will see Dr Wynetta Emery.

## 2012-12-29 ENCOUNTER — Encounter (HOSPITAL_COMMUNITY): Payer: Self-pay

## 2012-12-29 ENCOUNTER — Emergency Department (HOSPITAL_COMMUNITY)
Admission: EM | Admit: 2012-12-29 | Discharge: 2012-12-29 | Disposition: A | Discharge status: Home Health | Payer: No Typology Code available for payment source | Source: Home / Self Care | Attending: Family Medicine | Admitting: Family Medicine

## 2012-12-29 DIAGNOSIS — I4891 Unspecified atrial fibrillation: Secondary | ICD-10-CM

## 2012-12-29 DIAGNOSIS — E039 Hypothyroidism, unspecified: Secondary | ICD-10-CM

## 2012-12-29 DIAGNOSIS — I739 Peripheral vascular disease, unspecified: Secondary | ICD-10-CM

## 2012-12-29 DIAGNOSIS — B354 Tinea corporis: Secondary | ICD-10-CM

## 2012-12-29 DIAGNOSIS — D649 Anemia, unspecified: Secondary | ICD-10-CM

## 2012-12-29 DIAGNOSIS — M1990 Unspecified osteoarthritis, unspecified site: Secondary | ICD-10-CM

## 2012-12-29 DIAGNOSIS — G8929 Other chronic pain: Secondary | ICD-10-CM

## 2012-12-29 LAB — COMPREHENSIVE METABOLIC PANEL
ALT: 64 U/L — ABNORMAL HIGH (ref 0–35)
Calcium: 9.1 mg/dL (ref 8.4–10.5)
Creatinine, Ser: 0.51 mg/dL (ref 0.50–1.10)
GFR calc Af Amer: >90 mL/min (ref >90)
Glucose, Bld: 118 mg/dL — ABNORMAL HIGH (ref 70–99)
Sodium: 139 mEq/L (ref 135–145)
Total Protein: 8.1 g/dL (ref 6.0–8.3)

## 2012-12-29 LAB — LIPID PANEL
HDL: 40 mg/dL (ref >39)
Total CHOL/HDL Ratio: 3.8 RATIO
Triglycerides: 115 mg/dL (ref <150)

## 2012-12-29 LAB — CBC
HCT: 39.1 % (ref 36.0–46.0)
Hemoglobin: 13.6 g/dL (ref 12.0–15.0)
MCH: 31.1 pg (ref 26.0–34.0)
MCHC: 34.8 g/dL (ref 30.0–36.0)
MCV: 89.3 fL (ref 78.0–100.0)

## 2012-12-29 MED ORDER — OXYCODONE HCL 5 MG PO TABS: 5.0000 mg | ORAL_TABLET | Freq: Three times a day (TID) | ORAL | Status: DC | PRN

## 2012-12-29 MED ORDER — KETOCONAZOLE 2 % EX CREA: TOPICAL_CREAM | Freq: Every day | CUTANEOUS | Status: DC

## 2012-12-29 MED ORDER — HYDROCHLOROTHIAZIDE 12.5 MG PO TABS: 12.5000 mg | ORAL_TABLET | Freq: Every day | ORAL | Status: DC

## 2012-12-29 NOTE — ED Provider Notes (Signed)
History     CSN: [id number]  Arrival date & time 12/29/12  1007   First MD Initiated Contact with Patient 12/29/12 1017      Chief Complaint  Patient presents with  . Medication Refill    HPI Pt reports that she is having a rash under her breasts.  She is having persistent right shoulder pain since her fall 2 months ago.  Pt repots that she is having some relief with the pain medications started by Dr. Sloan Leiter.  Pt says she is ambulating better but still having difficulty.  Pt says she has rash on fingers and hands.   Past Medical History  Diagnosis Date  . Osteoporosis   . Depression   . Hypertension   . Hypothyroid   . Hypercholesterolemia   . Anemia   . Obesity     Past Surgical History  Procedure Laterality Date  . Abdominal hysterectomy    . Total knee arthroplasty      Family History  Problem Relation Age of Onset  . Cancer Mother   . Diabetes Mother   . Cancer Father     History  Substance Use Topics  . Smoking status: Current Every Day Smoker  . Smokeless tobacco: Not on file  . Alcohol Use: No    OB History   Grav Para Term Preterm Abortions TAB SAB Ect Mult Living                  Review of Systems Constitutional: Negative.  HENT: Negative.  Respiratory: Negative.  Cardiovascular: Negative.  Gastrointestinal: Negative.  Endocrine: Negative.  Genitourinary: Negative.  Musculoskeletal: severe chronic right hip and knee pain, right shoulder pain  Skin: rash on hands and fingers   Allergic/Immunologic: Negative.  Neurological: Negative.  Hematological: Negative.  Psychiatric/Behavioral: Negative.  All other systems reviewed and are negative   Allergies  Demerol and Penicillins  Home Medications   Current Outpatient Rx  Name  Route  Sig  Dispense  Refill  . lisinopril (PRINIVIL,ZESTRIL) 40 MG tablet   Oral   Take 40 mg by mouth daily.         Marland Kitchen oxyCODONE (ROXICODONE) 5 MG immediate release tablet   Oral   Take 1 tablet (5 mg  total) by mouth every 8 (eight) hours as needed for pain.   10 tablet   0     Do NOT Fill Before 10/28/12.   . sertraline (ZOLOFT) 50 MG tablet   Oral   Take 75 mg by mouth at bedtime.           BP 132/85  Pulse 88  Temp(Src) 98.5 F (36.9 C) (Oral)  Resp 18  SpO2 98%  Physical Exam Nursing note and vitals reviewed.  Constitutional: She is oriented to person, place, and time. She appears well-developed and well-nourished. No distress.  HENT:  Head: Normocephalic and atraumatic.  Eyes: Conjunctivae and EOM are normal. Pupils are equal, round, and reactive to light.  Neck: Normal range of motion. Neck supple. No JVD present. No tracheal deviation present. No thyromegaly present.  Cardiovascular: Normal rate, regular rhythm and normal heart sounds.  Pulmonary/Chest: yeast rash under both breasts. Effort normal and breath sounds normal. No respiratory distress. She has no wheezes.  Abdominal: Soft. Bowel sounds are normal.  Musculoskeletal: severe chronic shoulder pain right with motion, normal active motion and passive motion. Pronounced limp with ambulation.  Lymphadenopathy:  She has no cervical adenopathy.  Neurological: She is alert and oriented to person,  place, and time. She has normal reflexes.  Skin: Skin is warm and dry.  Psychiatric: She has a normal mood and affect. Her behavior is normal. Judgment and thought content normal.     ED Course  Procedures (including critical care time)  Labs Reviewed - No data to display No results found.   No diagnosis found.   MDM  IMPRESSION  Hypertension  Prediabetes   Chronic hip and leg pain  Right shoulder pain   Osteoporosis  Hypothyroidism  Anemia  RECOMMENDATIONS / PLAN  Recommended pt continue lisinopril 40 mg and HCT 12.5 mg daily Check labs today Refer to sports medicine for shoulder Refer to pain management for her chronic hip and leg pain Refilled pain meds until she can get established with  pain management Follow lab results.  Ketoconazole 2% creme applied to rash bid   FOLLOW UP 1 month   The patient was given clear instructions to go to ER or return to medical center if symptoms don't improve, worsen or new problems develop.  The patient verbalized understanding.  The patient was told to call to get lab results if they haven't heard anything in the next week.            Murlean Iba, MD 12/29/12 1043

## 2012-12-29 NOTE — ED Notes (Signed)
Medication refill Lisinopril and pain meds

## 2012-12-30 LAB — VITAMIN D 25 HYDROXY (VIT D DEFICIENCY, FRACTURES): Vit D, 25-Hydroxy: 15 ng/mL — ABNORMAL LOW (ref 30–89)

## 2013-01-01 ENCOUNTER — Encounter: Payer: Self-pay | Admitting: Family Medicine

## 2013-01-01 DIAGNOSIS — R748 Abnormal levels of other serum enzymes: Secondary | ICD-10-CM | POA: Insufficient documentation

## 2013-01-01 NOTE — Progress Notes (Signed)
Quick Note:  Please inform patient that her liver enzymes are abnormal. Please schedule her for an abdominal ultrasound to evaluate abnormal liver enzymes. The vitamin D level came back very low. Recommend that patient take over the counter Vitamin D 1000 IU po daily. Her thyroid TSH was mildly elevated. Please make sure to advise patient that she should avoid all alcohol consumption as this can injure her liver further. Her blood sugar was elevated. She has prediabetes. Recheck labs in 3 months.   Gerlene Fee, MD, CDE, [place], [place]   ______

## 2013-01-03 ENCOUNTER — Telehealth (HOSPITAL_COMMUNITY): Payer: Self-pay

## 2013-01-03 NOTE — ED Notes (Signed)
Referral faxed to [facility] For chronic hip and back pain

## 2013-01-03 NOTE — ED Notes (Signed)
Left message to return our call so that lab results can  Be given and to schedule and abd ultrasound

## 2013-01-03 NOTE — ED Notes (Signed)
Referral sent to [facility] for right shoulder pain Has an appt 01/24/13 Referral also sent to dermatology and pain management

## 2013-01-04 ENCOUNTER — Telehealth (HOSPITAL_COMMUNITY): Payer: Self-pay

## 2013-01-04 NOTE — ED Notes (Signed)
Patient returned call lab results given appt for ultra sound will call [facility]

## 2013-01-06 NOTE — ED Notes (Signed)
appt for ultrasound scheduled for tues may 6,2014 @ 9 am Patient to arrive at 8:45 Patient is aware of her appt.

## 2013-01-10 ENCOUNTER — Ambulatory Visit (HOSPITAL_COMMUNITY)
Admission: RE | Admit: 2013-01-10 | Discharge: 2013-01-10 | Disposition: A | Discharge status: Home / Self Care | Payer: No Typology Code available for payment source | Source: Ambulatory Visit | Attending: Internal Medicine | Admitting: Internal Medicine

## 2013-01-10 DIAGNOSIS — E669 Obesity, unspecified: Secondary | ICD-10-CM | POA: Insufficient documentation

## 2013-01-10 DIAGNOSIS — I10 Essential (primary) hypertension: Secondary | ICD-10-CM | POA: Insufficient documentation

## 2013-01-10 DIAGNOSIS — R945 Abnormal results of liver function studies: Secondary | ICD-10-CM | POA: Insufficient documentation

## 2013-01-10 DIAGNOSIS — Z9089 Acquired absence of other organs: Secondary | ICD-10-CM | POA: Insufficient documentation

## 2013-01-11 ENCOUNTER — Telehealth (HOSPITAL_COMMUNITY): Payer: Self-pay

## 2013-01-11 NOTE — ED Notes (Signed)
Dr Allyson Sabal spoke with patient Gave results of the US Abdomen

## 2013-01-13 ENCOUNTER — Encounter: Payer: Self-pay | Admitting: Physical Medicine & Rehabilitation

## 2013-01-24 ENCOUNTER — Encounter: Payer: Self-pay | Admitting: Family Medicine

## 2013-01-24 ENCOUNTER — Ambulatory Visit (INDEPENDENT_AMBULATORY_CARE_PROVIDER_SITE_OTHER): Payer: No Typology Code available for payment source | Admitting: Family Medicine

## 2013-01-24 VITALS — BP 125/84 | Ht 66.0 in | Wt 285.0 lb

## 2013-01-24 DIAGNOSIS — M67919 Unspecified disorder of synovium and tendon, unspecified shoulder: Secondary | ICD-10-CM

## 2013-01-24 DIAGNOSIS — M25511 Pain in right shoulder: Secondary | ICD-10-CM

## 2013-01-24 DIAGNOSIS — M25519 Pain in unspecified shoulder: Secondary | ICD-10-CM

## 2013-01-24 DIAGNOSIS — M7551 Bursitis of right shoulder: Secondary | ICD-10-CM

## 2013-01-24 MED ORDER — MELOXICAM 15 MG PO TABS: 15.0000 mg | ORAL_TABLET | Freq: Every day | ORAL | Status: DC

## 2013-01-24 MED ORDER — TRAMADOL HCL 50 MG PO TABS: 50.0000 mg | ORAL_TABLET | Freq: Every evening | ORAL | Status: DC | PRN

## 2013-01-24 MED ORDER — TRAMADOL HCL 50 MG PO TABS: 50.0000 mg | ORAL_TABLET | Freq: Four times a day (QID) | ORAL | Status: DC | PRN

## 2013-01-24 NOTE — Assessment & Plan Note (Signed)
Patient has what appears to be right subacromial bursitis. Patient was given different treatment options today and decided for more conservative therapy. Patient given home exercise program, given anti-inflammatories per orders, and also scheduled for formal physical therapy. Patient will try these interventions and then return again in 4-6 weeks. At that time if she continues to have pain that could consider doing a subacromial injection. Secondary to patient's habitus I would consider ultrasound-guided.

## 2013-01-24 NOTE — Progress Notes (Signed)
Reason for visit: Right shoulder pain  History of present illness: Patient is a pleasant 57 year old female coming in with right shoulder pain for 2 months duration. Patient did have a fall secondary to her chronic a problem and attempted to catch herself with the right arm. Patient to completely on the right side. Patient had pain immediately and went to the emergency department with aid of an ambulance. Patient did have x-rays that did not show any bony other maladies. These were reviewed today. Patient states since that time it is progressively seem to get better very slowly but unfortunately continues to have a dull aching sensation with mild radiation down the right upper try me. Patient denies any neck pain, weakness or any numbness of the extremity. Patient states that the pain does wake her up at night. Patient has tried over-the-counter anti-inflammatories with minimal improvement. Patient that it would be a good idea to be evaluated to make sure nothing drastically strong.  Past Medical History  Diagnosis Date  . Osteoporosis   . Depression   . Hypertension   . Hypothyroid   . Hypercholesterolemia   . Anemia   . Obesity    Past Surgical History  Procedure Laterality Date  . Abdominal hysterectomy    . Total knee arthroplasty     Family History  Problem Relation Age of Onset  . Cancer Mother   . Diabetes Mother   . Cancer Father    History  Substance Use Topics  . Smoking status: Current Every Day Smoker  . Smokeless tobacco: Not on file  . Alcohol Use: No    Medications reviewed  Physical exam Blood pressure 125/84, height 5\' 6"  (1.676 m), weight 285 lb (129.275 kg). General: No apparent distress alert and oriented x3 mood and affect normal. Patient is severely obese. Respiratory: Patient's speak in full sentences and does not appear short of breath Skin: Warm dry intact with no signs of infection or rash Neuro: Cranial nerves II through XII are intact,  neurovascularly intact in all extremities with 2+ DTRs and 2+ pulses. Right shoulder exam: On inspection no gross deformity. Patient does have full passive range of motion with some mild tenderness and pain with the terminal forward flexion as well as internal rotation. Patient does have primary impingement signs with Luan Pulling and Neers.  Rotator cuff strength is intact. She is neurovascularly intact distally.

## 2013-01-24 NOTE — Patient Instructions (Signed)
Very nice to meet you For your shoulder, I will give you a couple medicines.  Meloxicam.  Take 1 pill daily for 1 week then as needed therafter.  Tramadol at night as needed for pain. We will get you in to PT and they will call you with the an appointment.  I am giving you home exercises as well. Do these daily.  If not better come back in 4-6 weeks and then I will do an injection.

## 2013-01-27 ENCOUNTER — Ambulatory Visit: Payer: No Typology Code available for payment source | Attending: Family Medicine | Admitting: Family Medicine

## 2013-01-27 VITALS — BP 165/95 | HR 70 | Temp 98.3°F | Resp 16 | Wt 282.8 lb

## 2013-01-27 DIAGNOSIS — G894 Chronic pain syndrome: Secondary | ICD-10-CM

## 2013-01-27 MED ORDER — OXYCODONE HCL 5 MG PO TABS: 5.0000 mg | ORAL_TABLET | Freq: Three times a day (TID) | ORAL | Status: DC | PRN

## 2013-01-27 NOTE — Progress Notes (Signed)
Subjective:     Patient ID: [name], female   DOB: 06-17-1956, 57 y.o.   MRN: [name]  HPI Pt here with chronic pain secondary to DJD and shoulder bursitis. She has seen sportsmed for the shoulder and has an appt in June (02/06/13) with pain management. Requesting pain med refill until she sees pain mngmt. No new concerns or complaints today. Feeling well, aside from her chronic pain.    Review of Systems  Respiratory: Negative for shortness of breath.   Cardiovascular: Negative for chest pain.       Objective:   Physical Exam  Nursing note and vitals reviewed. Constitutional: She appears well-developed and well-nourished.  obese  Cardiovascular: Normal rate, regular rhythm and normal heart sounds.   Pulmonary/Chest: Effort normal and breath sounds normal.  Psychiatric: She has a normal mood and affect.       Assessment:     Chronic pain syndrome - Plan: oxyCODONE (ROXICODONE) 5 MG immediate release tablet       Plan:     Refilled pain meds x 1, which should get her until she sees PM. Had discussion with her that we will NOT be refilling pain meds after this. She verbalizes understanding.

## 2013-01-27 NOTE — Progress Notes (Signed)
Patient here for medication refills

## 2013-02-06 ENCOUNTER — Encounter: Payer: No Typology Code available for payment source | Attending: Physical Medicine & Rehabilitation

## 2013-02-06 ENCOUNTER — Ambulatory Visit (HOSPITAL_BASED_OUTPATIENT_CLINIC_OR_DEPARTMENT_OTHER): Payer: No Typology Code available for payment source | Admitting: Physical Medicine & Rehabilitation

## 2013-02-06 ENCOUNTER — Telehealth: Payer: Self-pay | Admitting: Family Medicine

## 2013-02-06 ENCOUNTER — Encounter: Payer: Self-pay | Admitting: Physical Medicine & Rehabilitation

## 2013-02-06 VITALS — BP 156/96 | HR 67 | Resp 14 | Ht 66.0 in | Wt 285.0 lb

## 2013-02-06 DIAGNOSIS — Z79899 Other long term (current) drug therapy: Secondary | ICD-10-CM | POA: Insufficient documentation

## 2013-02-06 DIAGNOSIS — M1611 Unilateral primary osteoarthritis, right hip: Secondary | ICD-10-CM

## 2013-02-06 DIAGNOSIS — M24569 Contracture, unspecified knee: Secondary | ICD-10-CM | POA: Insufficient documentation

## 2013-02-06 DIAGNOSIS — M67919 Unspecified disorder of synovium and tendon, unspecified shoulder: Secondary | ICD-10-CM

## 2013-02-06 DIAGNOSIS — M7551 Bursitis of right shoulder: Secondary | ICD-10-CM

## 2013-02-06 DIAGNOSIS — R269 Unspecified abnormalities of gait and mobility: Secondary | ICD-10-CM | POA: Insufficient documentation

## 2013-02-06 DIAGNOSIS — Z5181 Encounter for therapeutic drug level monitoring: Secondary | ICD-10-CM

## 2013-02-06 DIAGNOSIS — M169 Osteoarthritis of hip, unspecified: Secondary | ICD-10-CM

## 2013-02-06 DIAGNOSIS — M549 Dorsalgia, unspecified: Secondary | ICD-10-CM | POA: Insufficient documentation

## 2013-02-06 DIAGNOSIS — Z96659 Presence of unspecified artificial knee joint: Secondary | ICD-10-CM | POA: Insufficient documentation

## 2013-02-06 DIAGNOSIS — G8929 Other chronic pain: Secondary | ICD-10-CM | POA: Insufficient documentation

## 2013-02-06 DIAGNOSIS — M25559 Pain in unspecified hip: Secondary | ICD-10-CM | POA: Insufficient documentation

## 2013-02-06 DIAGNOSIS — M719 Bursopathy, unspecified: Secondary | ICD-10-CM | POA: Insufficient documentation

## 2013-02-06 NOTE — Telephone Encounter (Signed)
02/06/2013 Patient made aware that refill has been faxed to [facility] Fax number [phone] P.Pricilla Handler

## 2013-02-06 NOTE — Patient Instructions (Signed)
Sacroiliac injection next visit  Cont shoulder execise

## 2013-02-06 NOTE — Progress Notes (Signed)
Subjective:    Patient ID: [name], female    DOB: March 08, 1956, 57 y.o.   MRN: [name]  HPI Chief complaint is right hip pain. Reviewed x-rays showing loss of joint space on the right side. Also loss of bone stock the right femoral neck and proximal femur. Reviewed orthopedic notes from [facility]. These were from 2011. Hip replacement was discussed with patient however no guarantees were made in terms of success. Concerns were in regards to poor bone stock and the proximal femur area. The patient also has a history of right knee replacement this was evaluated by orthopedics felt to be well positioned. More recently he had a fall injuring coccyx as well as right shoulder. Right shoulder pain is improving with meloxicam ordered by urgent care. Pain Inventory Average Pain 6 Pain Right Now 7 My pain is aching  In the last 24 hours, has pain interfered with the following? General activity 6 Relation with others 6 Enjoyment of life 6 What TIME of day is your pain at its worst? daytime Sleep (in general) Poor  Pain is worse with: walking, bending and standing Pain improves with: rest and medication Relief from Meds: 6  Mobility use a cane how many minutes can you walk? 10 ability to climb steps?  no do you drive?  yes  Function disabled: date disabled 09/13/2009 I need assistance with the following:  household duties  Neuro/Psych trouble walking depression anxiety  Prior Studies Any changes since last visit?  no  Physicians involved in your care Primary care [facility]   Family History  Problem Relation Age of Onset  . Cancer Mother   . Diabetes Mother   . Cancer Father    History   Social History  . Marital Status: Divorced    Spouse Name: N/A    Number of Children: N/A  . Years of Education: N/A   Social History Main Topics  . Smoking status: Current Every Day Smoker  . Smokeless tobacco: Never Used  . Alcohol Use: No  .  Drug Use: No  . Sexually Active: None   Other Topics Concern  . None   Social History Narrative  . None   Past Surgical History  Procedure Laterality Date  . Abdominal hysterectomy    . Total knee arthroplasty    . Cholecystectomy     Past Medical History  Diagnosis Date  . Osteoporosis   . Depression   . Hypertension   . Hypothyroid   . Anemia   . Obesity    BP 156/96  Pulse 67  Resp 14  Ht 5\' 6"  (1.676 m)  Wt 285 lb (129.275 kg)  BMI 46.02 kg/m2  SpO2 97%    Review of Systems  Constitutional: Positive for diaphoresis and unexpected weight change.  Musculoskeletal: Positive for gait problem.       Hip is primary problem right side  Psychiatric/Behavioral: Positive for dysphoric mood. The patient is nervous/anxious.   All other systems reviewed and are negative.       Objective:   Physical Exam  Nursing note and vitals reviewed. Constitutional: She is oriented to person, place, and time. She appears well-developed.  Morbid obesity  HENT:  Head: Normocephalic and atraumatic.  Musculoskeletal:       Right hip: She exhibits decreased range of motion and bony tenderness.       Left hip: Normal.       Right knee: She exhibits decreased range of motion.  Left knee: Normal.  Right PSIS tenderness Tenderness over the anterior aspect of the greater trochanter on the right side Reduced range of motion right knee -20 extension. Flexion is to 60 Right hip 0 Internal and external rotation  Neurological: She is alert and oriented to person, place, and time. She has normal reflexes. No sensory deficit. She exhibits normal muscle tone.  Reduced strength in the right hip flexors knee extensors associated with reduced range of motion  Normal right ankle dorsiflexor and plantar flexor strength. Normal left-sided lower ext strength Normal upper extremity strength   Psychiatric: She has a normal mood and affect. Her behavior is normal.          Assessment &  Plan:  1. Chronic right hip pain. History of AVN with joint collapse. Also has localized bone cysts in the proximal femur area which has been evaluated by orthopedics and felt to be a marginal surgical candidate. This is the main pain at this time. She also has a leg length discrepancy which is causing some back pain most likely in the sacroiliac area.   2. Right shoulder bursitis improving after a fall She is receiving physical therapy for this. I would also ask them to work on her back pain and trial her once again with her shoe lift on the right side. This sounds like a builtup shoe that she received from the orthotist before she lost her insurance  3. Right knee contracture  after total knee replacement  We'll check urine drug screen and continue oxycodone 5 mg 3 times a day

## 2013-02-06 NOTE — Progress Notes (Deleted)
  Subjective:    Patient ID: [name], female    DOB: 07/26/1956, 57 y.o.   MRN: [name]  HPI    Review of Systems     Objective:   Physical Exam        Assessment & Plan:

## 2013-02-06 NOTE — Telephone Encounter (Signed)
During visit last week pt forgot to mention she needed a refill for lisinopril.  Pt says pharmacy has faxed over a couple of requests that have not received an answer.

## 2013-02-06 NOTE — Addendum Note (Signed)
Addended by: Caro Hight on: 02/06/2013 12:26 PM   Modules accepted: Orders

## 2013-02-27 ENCOUNTER — Telehealth: Payer: Self-pay | Admitting: *Deleted

## 2013-02-27 DIAGNOSIS — G894 Chronic pain syndrome: Secondary | ICD-10-CM

## 2013-02-27 MED ORDER — OXYCODONE HCL 5 MG PO TABS: 5.0000 mg | ORAL_TABLET | Freq: Three times a day (TID) | ORAL | Status: DC | PRN

## 2013-02-27 NOTE — Telephone Encounter (Signed)
Called for a refill on her pain medication.  Her UDS was consistent. Per office note, oxycodone 5 mg 1 q 8 hr continued (printed rx) for Kirsteins to sign.

## 2013-03-01 NOTE — Telephone Encounter (Signed)
Patient picked up oxycodone script 02/28/13 and signed CSA.

## 2013-03-07 ENCOUNTER — Ambulatory Visit (HOSPITAL_BASED_OUTPATIENT_CLINIC_OR_DEPARTMENT_OTHER): Payer: Medicare Other | Admitting: Physical Medicine & Rehabilitation

## 2013-03-07 ENCOUNTER — Encounter: Payer: Self-pay | Admitting: Physical Medicine & Rehabilitation

## 2013-03-07 ENCOUNTER — Encounter: Payer: Medicare Other | Attending: Physical Medicine & Rehabilitation

## 2013-03-07 VITALS — BP 154/80 | HR 103 | Resp 18 | Ht 66.0 in | Wt 281.0 lb

## 2013-03-07 DIAGNOSIS — M549 Dorsalgia, unspecified: Secondary | ICD-10-CM | POA: Diagnosis not present

## 2013-03-07 DIAGNOSIS — M533 Sacrococcygeal disorders, not elsewhere classified: Secondary | ICD-10-CM | POA: Diagnosis not present

## 2013-03-07 DIAGNOSIS — Z96659 Presence of unspecified artificial knee joint: Secondary | ICD-10-CM | POA: Insufficient documentation

## 2013-03-07 DIAGNOSIS — G8929 Other chronic pain: Secondary | ICD-10-CM | POA: Insufficient documentation

## 2013-03-07 DIAGNOSIS — M24569 Contracture, unspecified knee: Secondary | ICD-10-CM | POA: Insufficient documentation

## 2013-03-07 DIAGNOSIS — M25559 Pain in unspecified hip: Secondary | ICD-10-CM | POA: Diagnosis not present

## 2013-03-07 DIAGNOSIS — Z79899 Other long term (current) drug therapy: Secondary | ICD-10-CM | POA: Diagnosis not present

## 2013-03-07 DIAGNOSIS — G894 Chronic pain syndrome: Secondary | ICD-10-CM

## 2013-03-07 DIAGNOSIS — M719 Bursopathy, unspecified: Secondary | ICD-10-CM | POA: Insufficient documentation

## 2013-03-07 DIAGNOSIS — M67919 Unspecified disorder of synovium and tendon, unspecified shoulder: Secondary | ICD-10-CM | POA: Insufficient documentation

## 2013-03-07 DIAGNOSIS — R269 Unspecified abnormalities of gait and mobility: Secondary | ICD-10-CM | POA: Diagnosis not present

## 2013-03-07 MED ORDER — OXYCODONE HCL 5 MG PO TABS: 5.0000 mg | ORAL_TABLET | Freq: Three times a day (TID) | ORAL | Status: DC | PRN

## 2013-03-07 NOTE — Progress Notes (Signed)
Right sacroiliac injection under fluoroscopic guidance  Indication: Right Low back and buttocks pain not relieved by medication management and other conservative care.  Informed consent was obtained after describing risks and benefits of the procedure with the patient, this includes bleeding, bruising, infection, paralysis and medication side effects. The patient wishes to proceed and has given written consent. The patient was placed in a prone position. The lumbar and sacral area was marked and prepped with Betadine. A 25-gauge 1-1/2 inch needle was inserted into the skin and subcutaneous tissue and 1 mL of 1% lidocaine was injected. Then a 25-gauge 3 inch spinal needle was inserted under fluoroscopic guidance into the Right sacroiliac joint. AP and lateral images were utilized. Omnipaque 180x0.5 mL under live fluoroscopy demonstrated no intravascular uptake. Then a solution containing one ML of 40 mg per mL depomedrol and 2 ML of 1% lidocaine MPF was injected x1.5 mL. Patient tolerated the procedure well. Post procedure instructions were given. Please see post procedure form.

## 2013-03-07 NOTE — Progress Notes (Signed)
  [facility]   Name: [name] DOB:Apr 01, 1956 MRN: [id number]  Date:03/07/2013  Physician: Alysia Penna, MD    Nurse/CMA: Shumaker RN  Allergies:  Allergies  Allergen Reactions  . Penicillins Anaphylaxis  . Demerol     Consent Signed: yes  Is patient diabetic? no  CBG today?   Pregnant: no LMP: No LMP recorded. Patient has had a hysterectomy. (age [age])  Anticoagulants: no Anti-inflammatory: no Antibiotics: no  Procedure: right sacroiliac steroid injection Position: Prone Start Time: 11:04 End Time: 11:06 Fluoro Time: 12 seconds  RN/CMA Biomedical engineer    Time 10:19 11:11    BP 154/80 145/86    Pulse 103 94    Respirations 18 16    O2 Sat 92 97    S/S 6 6    Pain Level 7/10 5/10     D/C home with [name], patient A & O X 3, D/C instructions reviewed, and sits independently.

## 2013-03-28 ENCOUNTER — Ambulatory Visit: Payer: Medicare Other | Attending: Family Medicine | Admitting: Internal Medicine

## 2013-03-28 VITALS — BP 138/68 | HR 86 | Temp 97.7°F | Resp 16 | Wt 286.8 lb

## 2013-03-28 DIAGNOSIS — E039 Hypothyroidism, unspecified: Secondary | ICD-10-CM | POA: Diagnosis not present

## 2013-03-28 DIAGNOSIS — E669 Obesity, unspecified: Secondary | ICD-10-CM | POA: Diagnosis not present

## 2013-03-28 DIAGNOSIS — D696 Thrombocytopenia, unspecified: Secondary | ICD-10-CM | POA: Diagnosis not present

## 2013-03-28 DIAGNOSIS — F17200 Nicotine dependence, unspecified, uncomplicated: Secondary | ICD-10-CM | POA: Diagnosis not present

## 2013-03-28 DIAGNOSIS — M25559 Pain in unspecified hip: Secondary | ICD-10-CM | POA: Diagnosis not present

## 2013-03-28 DIAGNOSIS — R21 Rash and other nonspecific skin eruption: Secondary | ICD-10-CM | POA: Diagnosis not present

## 2013-03-28 DIAGNOSIS — I10 Essential (primary) hypertension: Secondary | ICD-10-CM | POA: Diagnosis not present

## 2013-03-28 DIAGNOSIS — G894 Chronic pain syndrome: Secondary | ICD-10-CM | POA: Diagnosis not present

## 2013-03-28 DIAGNOSIS — L259 Unspecified contact dermatitis, unspecified cause: Secondary | ICD-10-CM | POA: Insufficient documentation

## 2013-03-28 DIAGNOSIS — M810 Age-related osteoporosis without current pathological fracture: Secondary | ICD-10-CM | POA: Insufficient documentation

## 2013-03-28 MED ORDER — LISINOPRIL 40 MG PO TABS: 40.0000 mg | ORAL_TABLET | Freq: Every day | ORAL | Status: DC

## 2013-03-28 MED ORDER — AMLODIPINE BESYLATE 5 MG PO TABS: 5.0000 mg | ORAL_TABLET | Freq: Every day | ORAL | Status: DC

## 2013-03-28 MED ORDER — MELOXICAM 15 MG PO TABS: 15.0000 mg | ORAL_TABLET | Freq: Every day | ORAL | Status: DC

## 2013-03-28 MED ORDER — TRAMADOL HCL 50 MG PO TABS: 50.0000 mg | ORAL_TABLET | Freq: Every evening | ORAL | Status: DC | PRN

## 2013-03-28 MED ORDER — HYDROCHLOROTHIAZIDE 12.5 MG PO TABS: 12.5000 mg | ORAL_TABLET | Freq: Every day | ORAL | Status: DC

## 2013-03-28 MED ORDER — SERTRALINE HCL 50 MG PO TABS: 75.0000 mg | ORAL_TABLET | Freq: Every day | ORAL | Status: DC

## 2013-03-28 NOTE — Progress Notes (Signed)
Patient here for follow up Has been waking up with headaches and vomiting on  And off

## 2013-03-28 NOTE — Progress Notes (Signed)
Patient ID: [name], female   DOB: 01/03/1956, 57 y.o.   MRN: [name]  CC: needs derm consult, right hip pain  HPI: 57 year old female with past medical history of osteoporosis, depression, hypertension, obesity who presented to clinic for followup. Patient reports having skin eruptions over the lateral hands, fingers and has had a difficult time schedule an appointment with dermatology. Patient also reports having right hip pain which is chronic and she is taking Ultram for pain relief. She reports Ultram to somewhat provide symptomatic relief. She also follows in pain management clinic. In addition, patient reports not tolerating HCTZ very well, lot of GI side effects. She wakes up with nausea as well as headaches and when she takes lisinopril she feels better. She is already on maximum dose of lisinopril. No chest pains or cough or fever. No shortness of breath or palpitations.  Allergies  Allergen Reactions  . Penicillins Anaphylaxis  . Demerol    Past Medical History  Diagnosis Date  . Osteoporosis   . Depression   . Hypertension   . Hypothyroid   . Anemia   . Obesity    Current Outpatient Prescriptions on File Prior to Visit  Medication Sig Dispense Refill  . ketoconazole (NIZORAL) 2 % cream Apply topically daily. Apply to skin as directed twice per day as directed  30 g  1  . oxyCODONE (ROXICODONE) 5 MG immediate release tablet Take 1 tablet (5 mg total) by mouth every 8 (eight) hours as needed for pain.  90 tablet  0  . [DISCONTINUED] DULoxetine (CYMBALTA) 20 MG capsule Take 60 mg by mouth daily.       No current facility-administered medications on file prior to visit.   Family History  Problem Relation Age of Onset  . Cancer Mother   . Diabetes Mother   . Cancer Father    History   Social History  . Marital Status: Divorced    Spouse Name: N/A    Number of Children: N/A  . Years of Education: N/A   Occupational History  . Not on file.   Social History  Main Topics  . Smoking status: Current Every Day Smoker  . Smokeless tobacco: Never Used  . Alcohol Use: No  . Drug Use: No  . Sexually Active: Not on file   Other Topics Concern  . Not on file   Social History Narrative  . No narrative on file    Review of Systems  Constitutional: Negative for fever, chills, diaphoresis, activity change, appetite change and fatigue.  HENT: Negative for ear pain, nosebleeds, congestion, facial swelling, rhinorrhea, neck pain, neck stiffness and ear discharge.   Eyes: Negative for pain, discharge, redness, itching and visual disturbance.  Respiratory: Negative for cough, choking, chest tightness, shortness of breath, wheezing and stridor.   Cardiovascular: Negative for chest pain, palpitations and leg swelling.  Gastrointestinal: Negative for abdominal distention.  Genitourinary: Negative for dysuria, urgency, frequency, hematuria, flank pain, decreased urine volume, difficulty urinating and dyspareunia.  Musculoskeletal: Positive for right hip pain, no weakness and no falls Neurological: Negative for dizziness, tremors, seizures, syncope, facial asymmetry, speech difficulty, weakness, light-headedness, numbness and headaches.  Hematological: Negative for adenopathy. Does not bruise/bleed easily.  Psychiatric/Behavioral: Negative for hallucinations, behavioral problems, confusion, dysphoric mood, decreased concentration and agitation.    Objective:   Filed Vitals:   03/28/13 1126  BP: 138/68  Pulse: 86  Temp: 97.7 F (36.5 C)  Resp: 16    Physical Exam  Constitutional: Appears  well-developed and well-nourished. No distress.  HENT: Normocephalic. External right and left ear normal. Oropharynx is clear and moist.  Eyes: Conjunctivae and EOM are normal. PERRLA, no scleral icterus.  Neck: Normal ROM. Neck supple. No JVD. No tracheal deviation. No thyromegaly.  CVS: RRR, S1/S2 +, no murmurs, no gallops, no carotid bruit.  Pulmonary: Effort and  breath sounds normal, no stridor, rhonchi, wheezes, rales.  Abdominal: Soft. BS +,  no distension, tenderness, rebound or guarding.  Musculoskeletal: Normal range of motion. No edema and no tenderness.  Lymphadenopathy: No lymphadenopathy noted, cervical, inguinal. Neuro: Alert. Normal reflexes, muscle tone coordination. No cranial nerve deficit. Skin: Skin is warm and dry. No rash noted. Not diaphoretic. No erythema. No pallor.  Psychiatric: Normal mood and affect. Behavior, judgment, thought content normal.   Lab Results  Component Value Date   WBC 9.1 12/29/2012   HGB 13.6 12/29/2012   HCT 39.1 12/29/2012   MCV 89.3 12/29/2012   PLT 112* 12/29/2012   Lab Results  Component Value Date   CREATININE 0.51 12/29/2012   BUN 10 12/29/2012   NA 139 12/29/2012   K 3.4* 12/29/2012   CL 106 12/29/2012   CO2 24 12/29/2012    Lab Results  Component Value Date   HGBA1C 5.7* 09/01/2012   Lipid Panel     Component Value Date/Time   CHOL 152 12/29/2012 1100   TRIG 115 12/29/2012 1100   HDL 40 12/29/2012 1100   CHOLHDL 3.8 12/29/2012 1100   VLDL 23 12/29/2012 1100   LDLCALC 89 12/29/2012 1100       Assessment and plan:   Patient Active Problem List   Diagnosis Date Noted  . HTN (hypertension) - We have discussed target BP range - I have advised pt to check BP regularly and to call us back if the numbers are higher than 140/90 - discussed the importance of compliance with medical therapy and diet  - pt does not tolerate Hctz so we have started Norvasc 5 mg daily - continue lisinopril 40 mg daily  03/28/2013  . Right hip pain - Referral to orthopedic surgery provided  01/01/2013  . Skin eczema - If her altered dermatology provided  04/29/2009

## 2013-03-28 NOTE — Patient Instructions (Addendum)

## 2013-03-30 ENCOUNTER — Encounter
Payer: Medicare Other | Attending: Physical Medicine and Rehabilitation | Admitting: Physical Medicine and Rehabilitation

## 2013-03-30 ENCOUNTER — Encounter: Payer: Self-pay | Admitting: Physical Medicine and Rehabilitation

## 2013-03-30 VITALS — BP 149/70 | HR 93 | Resp 18 | Ht 66.0 in | Wt 274.0 lb

## 2013-03-30 DIAGNOSIS — M545 Low back pain, unspecified: Secondary | ICD-10-CM | POA: Diagnosis not present

## 2013-03-30 DIAGNOSIS — M25559 Pain in unspecified hip: Secondary | ICD-10-CM | POA: Diagnosis not present

## 2013-03-30 DIAGNOSIS — M24569 Contracture, unspecified knee: Secondary | ICD-10-CM | POA: Insufficient documentation

## 2013-03-30 DIAGNOSIS — M5380 Other specified dorsopathies, site unspecified: Secondary | ICD-10-CM | POA: Diagnosis not present

## 2013-03-30 DIAGNOSIS — G8929 Other chronic pain: Secondary | ICD-10-CM | POA: Insufficient documentation

## 2013-03-30 DIAGNOSIS — I10 Essential (primary) hypertension: Secondary | ICD-10-CM | POA: Diagnosis not present

## 2013-03-30 DIAGNOSIS — Z96651 Presence of right artificial knee joint: Secondary | ICD-10-CM

## 2013-03-30 DIAGNOSIS — E039 Hypothyroidism, unspecified: Secondary | ICD-10-CM | POA: Diagnosis not present

## 2013-03-30 DIAGNOSIS — M8560 Other cyst of bone, unspecified site: Secondary | ICD-10-CM | POA: Insufficient documentation

## 2013-03-30 DIAGNOSIS — M67919 Unspecified disorder of synovium and tendon, unspecified shoulder: Secondary | ICD-10-CM | POA: Insufficient documentation

## 2013-03-30 DIAGNOSIS — Z96659 Presence of unspecified artificial knee joint: Secondary | ICD-10-CM | POA: Diagnosis not present

## 2013-03-30 DIAGNOSIS — M810 Age-related osteoporosis without current pathological fracture: Secondary | ICD-10-CM | POA: Insufficient documentation

## 2013-03-30 DIAGNOSIS — M8700 Idiopathic aseptic necrosis of unspecified bone: Secondary | ICD-10-CM | POA: Diagnosis not present

## 2013-03-30 DIAGNOSIS — M2170 Unequal limb length (acquired), unspecified site: Secondary | ICD-10-CM | POA: Diagnosis not present

## 2013-03-30 DIAGNOSIS — M719 Bursopathy, unspecified: Secondary | ICD-10-CM | POA: Insufficient documentation

## 2013-03-30 DIAGNOSIS — F17200 Nicotine dependence, unspecified, uncomplicated: Secondary | ICD-10-CM | POA: Diagnosis not present

## 2013-03-30 MED ORDER — DICLOFENAC SODIUM 1 % TD GEL: 2.0000 g | Freq: Four times a day (QID) | TRANSDERMAL | Status: DC

## 2013-03-30 MED ORDER — METHOCARBAMOL 500 MG PO TABS: 500.0000 mg | ORAL_TABLET | Freq: Three times a day (TID) | ORAL | Status: DC

## 2013-03-30 NOTE — Progress Notes (Signed)
Subjective:    Patient ID: [name], female    DOB: 1956-03-05, 57 y.o.   MRN: [name]  HPI Chief complaint is right hip pain. The patient is a 57 year old female, who presents with right hip pain, in the groin, and left LBP . The symptoms started 2 years ago. The patient complains about moderate to severe pain  .Patient denies  numbness and tingling. She describes the pain as aching and sometimes sharp and stabbing.  Taking medications , changing positions alleviate the symptoms. Prolonged standing or walking aggrevates the symptoms. The patient grades her pain as a  6/10. Patient received right SI injection by Dr. Read Drivers on 03/07/13, which only helped for 1 day, after that she had the same pain, same severity as before.   X-rays showing loss of joint space on the right side. Also loss of bone stock the right femoral neck and proximal femur.  Hip replacement was discussed with patient however no guarantees were made in terms of success. Concerns were in regards to poor bone stock and the proximal femur area.  The patient also has a history of right knee replacement this was evaluated by orthopedics felt to be well positioned.     Pain Inventory Average Pain 6 Pain Right Now na My pain is sharp and aching  In the last 24 hours, has pain interfered with the following? General activity 8 Relation with others 8 Enjoyment of life 8 What TIME of day is your pain at its worst? daytime Sleep (in general) Fair  Pain is worse with: walking and bending Pain improves with: rest and medication Relief from Meds: 5  Mobility use a cane how many minutes can you walk? 5 ability to climb steps?  yes do you drive?  yes Do you have any goals in this area?  yes  Function Do you have any goals in this area?  no  Neuro/Psych trouble walking  Prior Studies Any changes since last visit?  no  Physicians involved in your care Any changes since last visit?  no   Family History   Problem Relation Age of Onset  . Cancer Mother   . Diabetes Mother   . Cancer Father    History   Social History  . Marital Status: Divorced    Spouse Name: N/A    Number of Children: N/A  . Years of Education: N/A   Social History Main Topics  . Smoking status: Current Every Day Smoker  . Smokeless tobacco: Never Used  . Alcohol Use: No  . Drug Use: No  . Sexually Active: None   Other Topics Concern  . None   Social History Narrative  . None   Past Surgical History  Procedure Laterality Date  . Abdominal hysterectomy    . Total knee arthroplasty    . Cholecystectomy     Past Medical History  Diagnosis Date  . Osteoporosis   . Depression   . Hypertension   . Hypothyroid   . Anemia   . Obesity    BP 149/70  Pulse 93  Resp 18  Ht 5\' 6"  (1.676 m)  Wt 274 lb (124.286 kg)  BMI 44.25 kg/m2  SpO2 94%     Review of Systems  Constitutional: Positive for diaphoresis and unexpected weight change.  Musculoskeletal: Positive for gait problem.  All other systems reviewed and are negative.       Objective:   Physical Exam  Constitutional: She is oriented to person, place,  and time. She appears well-developed and well-nourished.  Morbidly obese  HENT:  Head: Normocephalic.  Neck: Neck supple.  Musculoskeletal: She exhibits tenderness.  Neurological: She is alert and oriented to person, place, and time.  Skin: Skin is warm and dry.  Psychiatric: She has a normal mood and affect.   Symmetric normal motor tone is noted throughout. Normal muscle bulk. Muscle testing reveals 5/5 muscle strength of the upper extremity, and 5/5 of the lower extremity, except right iliopsoas 3/5, right quadriceps 4/5, and right glut med 4/5. Full range of motion in upper and lower extremities, except right knee, flex 80 degrees, extension deficit of 15 degrees, right hip decreased ROM, with pain. ROM of spine is restricted. Fine motor movements are normal in both hands. . DTR in  the upper and lower extremity are present and symmetric 2+, except right patella reflex 0. No clonus is noted.  Patient arises from chair with difficulty. Wide based gait with kyphotic posture , with a walker.       Assessment & Plan:  1. Chronic right hip pain. History of AVN with joint collapse. Also has localized bone cysts in the proximal femur area which has been evaluated by orthopedics and felt to be a marginal surgical candidate. This is the main pain at this time. She also has a leg length discrepancy which is causing some back pain paravertebral muscles on the left.  2. Right shoulder bursitis improving   3. Right knee contracture after total knee replacement  Ordered aquatic PT, recommended aquatic exercises in a pool after PT. Also discussed a healthy lifestyle , and encouraged weight loss.Also prescribed Voltaren gel for her knee pain, and Robaxin for her muscle spasms/pain in her lower back on the left side.    Patient still has a prescription for the Oxycodone, Dr. Read Drivers gave her one too early, which she had not filled yet, she is taking oxycodone 5 mg 3 times a day. Follow up in one month

## 2013-03-30 NOTE — Patient Instructions (Signed)
Try to stay as active as pain permits

## 2013-04-03 DIAGNOSIS — M545 Low back pain: Secondary | ICD-10-CM | POA: Diagnosis not present

## 2013-04-03 DIAGNOSIS — M8700 Idiopathic aseptic necrosis of unspecified bone: Secondary | ICD-10-CM | POA: Diagnosis not present

## 2013-04-06 DIAGNOSIS — M8700 Idiopathic aseptic necrosis of unspecified bone: Secondary | ICD-10-CM | POA: Diagnosis not present

## 2013-04-06 DIAGNOSIS — M545 Low back pain: Secondary | ICD-10-CM | POA: Diagnosis not present

## 2013-04-14 ENCOUNTER — Ambulatory Visit (INDEPENDENT_AMBULATORY_CARE_PROVIDER_SITE_OTHER): Payer: Medicare Other | Admitting: Sports Medicine

## 2013-04-14 VITALS — BP 146/83 | Ht 66.0 in | Wt 283.0 lb

## 2013-04-14 DIAGNOSIS — M25519 Pain in unspecified shoulder: Secondary | ICD-10-CM | POA: Diagnosis not present

## 2013-04-14 DIAGNOSIS — M25559 Pain in unspecified hip: Secondary | ICD-10-CM

## 2013-04-14 DIAGNOSIS — M25551 Pain in right hip: Secondary | ICD-10-CM

## 2013-04-14 DIAGNOSIS — M545 Low back pain: Secondary | ICD-10-CM | POA: Diagnosis not present

## 2013-04-14 DIAGNOSIS — S300XXA Contusion of lower back and pelvis, initial encounter: Secondary | ICD-10-CM | POA: Diagnosis not present

## 2013-04-14 DIAGNOSIS — M8700 Idiopathic aseptic necrosis of unspecified bone: Secondary | ICD-10-CM

## 2013-04-14 DIAGNOSIS — M2170 Unequal limb length (acquired), unspecified site: Secondary | ICD-10-CM | POA: Diagnosis not present

## 2013-04-14 DIAGNOSIS — M67919 Unspecified disorder of synovium and tendon, unspecified shoulder: Secondary | ICD-10-CM | POA: Diagnosis not present

## 2013-04-14 HISTORY — DX: Idiopathic aseptic necrosis of unspecified bone: M87.00

## 2013-04-14 NOTE — Assessment & Plan Note (Addendum)
Patient has severe degeneration secondary to AVN of her right hip. She will continue her current pain management regimen and water therapy Short of surgery there is little curative intervention possible however, there is a high risk of surgical complications and she is not ready at this time to pursue that.

## 2013-04-14 NOTE — Progress Notes (Signed)
  Subjective:    Patient ID: [name], female    DOB: 1956-02-20, 57 y.o.   MRN: [name]  HPI This is a 57 year old female presents to the sports medicine clinic for evaluation of right hip pain. She has a history of AVN of the right hip with chronic pain. She has been evaluated by an orthopedist at [facility] and is followed by Dr. Delynn Flavin from pain management.  She presents to the sports medicine clinic to discuss any further options for her chronic hip pain.  She has a history of a right total knee replacement in 2003 and developed right hip pain 5-6 years ago. At this time she reports a hesitancy to have surgical intervention done as a result likelihood of permanent nerve injury and foot drop secondary to her marked leg leg discrepancy. The leg length discrepancy is due to shortening of her right leg secondary to collapse of the right femoral head.  She states that she has chronic pain worse with movement. She ambulates with a cane.  She is currently taking oxycodone and using Voltaren Gel.  She denies any recent injury.  Past Medical History  Diagnosis Date  . Osteoporosis   . Depression   . Hypertension   . Hypothyroid   . Anemia   . Obesity    Past Surgical History  Procedure Laterality Date  . Abdominal hysterectomy    . Total knee arthroplasty    . Cholecystectomy     Allergies  Allergen Reactions  . Penicillins Anaphylaxis  . Demerol     Review of Systems  Constitutional: Positive for activity change.  Respiratory: Negative for cough and shortness of breath.   Cardiovascular: Negative for chest pain and leg swelling.  Gastrointestinal: Negative for abdominal pain.  Musculoskeletal: Positive for back pain, arthralgias and gait problem. Negative for myalgias.  Neurological: Negative for weakness and numbness.   as per history of present illness and above otherwise negative     Objective:   Physical Exam  BP 146/83  Ht 5\' 6"  (1.676 m)  Wt 283 lb (128.368 kg)   BMI 45.7 kg/m2 This is a pleasant 57 year old obese white female no acute distress awake alert and oriented x3 HEENT normocephalic and atraumatic  Gait: antalgic with Trendelenberg to right.  Right Hip: ROM IR: 0-5, ER: 30 Deg, Flexion: 90 Deg, Extension: 10 Deg, Abduction: 15 Deg, Adduction: 15 Deg Strength IR: 5/5, ER: 5/5, Flexion: 5/5, Extension: 5/5, Abduction: 5/5, Adduction: 5/5 Pelvic alignment is tilted to right. Greater trochanter without tenderness to palpation.. No SI joint tenderness and normal minimal SI movement.  Left Hip within normal limits to ROM and strength.  Neurovascularly intact bilateral lower extremities:  Pulses 2+ and equal, sensation intact  Leg length discrepancy of 2.5 cm shorter on the right than the left as measured from ASIS to medial malleolus.   X-rays: Prior x-rays of her hip and pelvis were reviewed in the office with her today. These demonstrate severe AVN with distraction of the right femoral head and marked shortening. No new imaging needed at this time.

## 2013-04-14 NOTE — Assessment & Plan Note (Addendum)
A 5/16 heel lift was placed on a green soles insole for her to use to help alleviate some of her leg length discrepancy and improve her gait.  She'll followup as needed in the sports medicine clinic

## 2013-04-18 DIAGNOSIS — M8700 Idiopathic aseptic necrosis of unspecified bone: Secondary | ICD-10-CM | POA: Diagnosis not present

## 2013-04-18 DIAGNOSIS — M545 Low back pain: Secondary | ICD-10-CM | POA: Diagnosis not present

## 2013-04-20 DIAGNOSIS — M8700 Idiopathic aseptic necrosis of unspecified bone: Secondary | ICD-10-CM | POA: Diagnosis not present

## 2013-04-20 DIAGNOSIS — M545 Low back pain: Secondary | ICD-10-CM | POA: Diagnosis not present

## 2013-04-24 DIAGNOSIS — L301 Dyshidrosis [pompholyx]: Secondary | ICD-10-CM | POA: Diagnosis not present

## 2013-04-24 DIAGNOSIS — D485 Neoplasm of uncertain behavior of skin: Secondary | ICD-10-CM | POA: Diagnosis not present

## 2013-04-25 DIAGNOSIS — M8700 Idiopathic aseptic necrosis of unspecified bone: Secondary | ICD-10-CM | POA: Diagnosis not present

## 2013-04-25 DIAGNOSIS — M545 Low back pain: Secondary | ICD-10-CM | POA: Diagnosis not present

## 2013-04-27 DIAGNOSIS — M545 Low back pain: Secondary | ICD-10-CM | POA: Diagnosis not present

## 2013-04-27 DIAGNOSIS — M8700 Idiopathic aseptic necrosis of unspecified bone: Secondary | ICD-10-CM | POA: Diagnosis not present

## 2013-05-01 ENCOUNTER — Encounter: Payer: Self-pay | Admitting: Physical Medicine and Rehabilitation

## 2013-05-01 ENCOUNTER — Encounter
Payer: Medicare Other | Attending: Physical Medicine and Rehabilitation | Admitting: Physical Medicine and Rehabilitation

## 2013-05-01 VITALS — BP 156/73 | HR 90 | Resp 14 | Ht 66.0 in | Wt 274.0 lb

## 2013-05-01 DIAGNOSIS — E039 Hypothyroidism, unspecified: Secondary | ICD-10-CM | POA: Insufficient documentation

## 2013-05-01 DIAGNOSIS — M24569 Contracture, unspecified knee: Secondary | ICD-10-CM | POA: Diagnosis not present

## 2013-05-01 DIAGNOSIS — M25559 Pain in unspecified hip: Secondary | ICD-10-CM | POA: Insufficient documentation

## 2013-05-01 DIAGNOSIS — M8700 Idiopathic aseptic necrosis of unspecified bone: Secondary | ICD-10-CM

## 2013-05-01 DIAGNOSIS — G894 Chronic pain syndrome: Secondary | ICD-10-CM | POA: Diagnosis not present

## 2013-05-01 DIAGNOSIS — Z96659 Presence of unspecified artificial knee joint: Secondary | ICD-10-CM | POA: Insufficient documentation

## 2013-05-01 DIAGNOSIS — G8929 Other chronic pain: Secondary | ICD-10-CM | POA: Diagnosis not present

## 2013-05-01 DIAGNOSIS — I10 Essential (primary) hypertension: Secondary | ICD-10-CM | POA: Insufficient documentation

## 2013-05-01 DIAGNOSIS — M810 Age-related osteoporosis without current pathological fracture: Secondary | ICD-10-CM | POA: Insufficient documentation

## 2013-05-01 DIAGNOSIS — R109 Unspecified abdominal pain: Secondary | ICD-10-CM | POA: Diagnosis not present

## 2013-05-01 DIAGNOSIS — M719 Bursopathy, unspecified: Secondary | ICD-10-CM | POA: Insufficient documentation

## 2013-05-01 DIAGNOSIS — M545 Low back pain, unspecified: Secondary | ICD-10-CM | POA: Insufficient documentation

## 2013-05-01 DIAGNOSIS — F17200 Nicotine dependence, unspecified, uncomplicated: Secondary | ICD-10-CM | POA: Diagnosis not present

## 2013-05-01 DIAGNOSIS — Z96651 Presence of right artificial knee joint: Secondary | ICD-10-CM

## 2013-05-01 DIAGNOSIS — M8569 Other cyst of bone, multiple sites: Secondary | ICD-10-CM | POA: Insufficient documentation

## 2013-05-01 DIAGNOSIS — M67919 Unspecified disorder of synovium and tendon, unspecified shoulder: Secondary | ICD-10-CM | POA: Diagnosis not present

## 2013-05-01 MED ORDER — OXYCODONE HCL 5 MG PO TABS: 5.0000 mg | ORAL_TABLET | Freq: Three times a day (TID) | ORAL | Status: DC | PRN

## 2013-05-01 NOTE — Progress Notes (Signed)
Subjective:    Patient ID: [name], female    DOB: August 30, 1956, 57 y.o.   MRN: [name]  HPI Chief complaint is right hip pain.  The patient is a 57 year old female, who presents with right hip pain, in the groin, and left LBP . The symptoms started 2 years ago. The patient complains about moderate to severe pain .Patient denies numbness and tingling. She describes the pain as aching and sometimes sharp and stabbing. Taking medications , changing positions alleviate the symptoms. Prolonged standing or walking aggrevates the symptoms. The patient grades her pain as a 6/10.  Patient received right SI injection by Dr. Read Drivers on 03/07/13, which only helped for 1 day, after that she had the same pain, same severity as before.  Patient is doing aquatic therapy, which I ordered, now, which is helping a lot, and which she enjoys. She also reports that the Voltaren, I prescribed, is helping with her knee pain.  X-rays showing loss of joint space on the right side. Also loss of bone stock the right femoral neck and proximal femur. Hip replacement was discussed with patient however no guarantees were made in terms of success. Concerns were in regards to poor bone stock and the proximal femur area.  The patient also has a history of right knee replacement this was evaluated by orthopedics felt to be well positioned.   Pain Inventory Average Pain 5 Pain Right Now 5 My pain is sharp, burning and aching  In the last 24 hours, has pain interfered with the following? General activity 4 Relation with others 4 Enjoyment of life 3 What TIME of day is your pain at its worst? morning Sleep (in general) Fair  Pain is worse with: walking, bending and standing Pain improves with: therapy/exercise and medication Relief from Meds: 9  Mobility walk with assistance use a cane how many minutes can you walk? 5-7 ability to climb steps?  yes do you drive?  yes Do you have any goals in this area?   yes  Function disabled: date disabled na  Neuro/Psych trouble walking  Prior Studies Any changes since last visit?  no  Physicians involved in your care Any changes since last visit?  no   Family History  Problem Relation Age of Onset  . Cancer Mother   . Diabetes Mother   . Cancer Father    History   Social History  . Marital Status: Divorced    Spouse Name: N/A    Number of Children: N/A  . Years of Education: N/A   Social History Main Topics  . Smoking status: Current Every Day Smoker  . Smokeless tobacco: Never Used  . Alcohol Use: No  . Drug Use: No  . Sexual Activity: None   Other Topics Concern  . None   Social History Narrative  . None   Past Surgical History  Procedure Laterality Date  . Abdominal hysterectomy    . Total knee arthroplasty    . Cholecystectomy     Past Medical History  Diagnosis Date  . Osteoporosis   . Depression   . Hypertension   . Hypothyroid   . Anemia   . Obesity    BP 156/73  Pulse 90  Resp 14  Ht 5\' 6"  (1.676 m)  Wt 274 lb (124.286 kg)  BMI 44.25 kg/m2  SpO2 95%     Review of Systems  Musculoskeletal: Positive for gait problem.       Objective:   Physical  Exam Constitutional: She is oriented to person, place, and time. She appears well-developed and well-nourished.  Morbidly obese  HENT:  Head: Normocephalic.  Neck: Neck supple.  Musculoskeletal: She exhibits tenderness.  Neurological: She is alert and oriented to person, place, and time.  Skin: Skin is warm and dry.  Psychiatric: She has a normal mood and affect.  Symmetric normal motor tone is noted throughout. Normal muscle bulk. Muscle testing reveals 5/5 muscle strength of the upper extremity, and 5/5 of the lower extremity, except right iliopsoas 3/5, right quadriceps 4/5, and right glut med 4/5. Full range of motion in upper and lower extremities, except right knee, flex 80 degrees, extension deficit of 15 degrees, right hip decreased ROM, with  pain. ROM of spine is restricted. Fine motor movements are normal in both hands.  .  DTR in the upper and lower extremity are present and symmetric 2+, except right patella reflex 0. No clonus is noted.  Patient arises from chair with difficulty. Wide based gait with kyphotic posture , with a cane.        Assessment & Plan:  1. Chronic right hip pain. History of AVN with joint collapse. Also has localized bone cysts in the proximal femur area which has been evaluated by orthopedics and felt to be a marginal surgical candidate. This is the main pain at this time. She also has a leg length discrepancy which is causing some back pain paravertebral muscles on the left.  2. Right shoulder bursitis improving  3. Right knee contracture after total knee replacement  Continue with aquatic PT,which has helped and which she is enjoying, recommended aquatic exercises in a pool after PT. Also discussed a healthy lifestyle , and encouraged weight loss.Also continue with Voltaren gel for her knee pain, and Robaxin for her muscle spasms/pain in her lower back on the left side.  Refilled oxycodone 5 mg 3 times a day.  Follow up in one month

## 2013-05-01 NOTE — Patient Instructions (Signed)
Continue with your aquatic therapy.

## 2013-05-15 DIAGNOSIS — M8700 Idiopathic aseptic necrosis of unspecified bone: Secondary | ICD-10-CM | POA: Diagnosis not present

## 2013-05-15 DIAGNOSIS — M545 Low back pain: Secondary | ICD-10-CM | POA: Diagnosis not present

## 2013-05-17 DIAGNOSIS — M8700 Idiopathic aseptic necrosis of unspecified bone: Secondary | ICD-10-CM | POA: Diagnosis not present

## 2013-05-17 DIAGNOSIS — M545 Low back pain: Secondary | ICD-10-CM | POA: Diagnosis not present

## 2013-05-22 DIAGNOSIS — M545 Low back pain: Secondary | ICD-10-CM | POA: Diagnosis not present

## 2013-05-22 DIAGNOSIS — M8700 Idiopathic aseptic necrosis of unspecified bone: Secondary | ICD-10-CM | POA: Diagnosis not present

## 2013-05-24 DIAGNOSIS — M545 Low back pain: Secondary | ICD-10-CM | POA: Diagnosis not present

## 2013-05-24 DIAGNOSIS — M8700 Idiopathic aseptic necrosis of unspecified bone: Secondary | ICD-10-CM | POA: Diagnosis not present

## 2013-05-29 ENCOUNTER — Encounter
Payer: Medicare Other | Attending: Physical Medicine and Rehabilitation | Admitting: Physical Medicine and Rehabilitation

## 2013-05-29 ENCOUNTER — Encounter: Payer: Self-pay | Admitting: Physical Medicine and Rehabilitation

## 2013-05-29 VITALS — BP 128/70 | HR 94 | Resp 18 | Ht 65.0 in | Wt 289.0 lb

## 2013-05-29 DIAGNOSIS — F329 Major depressive disorder, single episode, unspecified: Secondary | ICD-10-CM | POA: Insufficient documentation

## 2013-05-29 DIAGNOSIS — I10 Essential (primary) hypertension: Secondary | ICD-10-CM | POA: Diagnosis not present

## 2013-05-29 DIAGNOSIS — Z96659 Presence of unspecified artificial knee joint: Secondary | ICD-10-CM | POA: Diagnosis not present

## 2013-05-29 DIAGNOSIS — F3289 Other specified depressive episodes: Secondary | ICD-10-CM | POA: Insufficient documentation

## 2013-05-29 DIAGNOSIS — E039 Hypothyroidism, unspecified: Secondary | ICD-10-CM | POA: Diagnosis not present

## 2013-05-29 DIAGNOSIS — Z79899 Other long term (current) drug therapy: Secondary | ICD-10-CM | POA: Diagnosis not present

## 2013-05-29 DIAGNOSIS — M67919 Unspecified disorder of synovium and tendon, unspecified shoulder: Secondary | ICD-10-CM | POA: Diagnosis not present

## 2013-05-29 DIAGNOSIS — M25559 Pain in unspecified hip: Secondary | ICD-10-CM | POA: Diagnosis not present

## 2013-05-29 DIAGNOSIS — M24569 Contracture, unspecified knee: Secondary | ICD-10-CM | POA: Insufficient documentation

## 2013-05-29 DIAGNOSIS — G894 Chronic pain syndrome: Secondary | ICD-10-CM | POA: Diagnosis not present

## 2013-05-29 DIAGNOSIS — M810 Age-related osteoporosis without current pathological fracture: Secondary | ICD-10-CM | POA: Insufficient documentation

## 2013-05-29 DIAGNOSIS — M2170 Unequal limb length (acquired), unspecified site: Secondary | ICD-10-CM | POA: Insufficient documentation

## 2013-05-29 DIAGNOSIS — M8700 Idiopathic aseptic necrosis of unspecified bone: Secondary | ICD-10-CM | POA: Diagnosis not present

## 2013-05-29 DIAGNOSIS — G8929 Other chronic pain: Secondary | ICD-10-CM

## 2013-05-29 DIAGNOSIS — M719 Bursopathy, unspecified: Secondary | ICD-10-CM | POA: Insufficient documentation

## 2013-05-29 DIAGNOSIS — M545 Low back pain: Secondary | ICD-10-CM | POA: Diagnosis not present

## 2013-05-29 MED ORDER — OXYCODONE HCL 5 MG PO TABS: 5.0000 mg | ORAL_TABLET | Freq: Three times a day (TID) | ORAL | Status: DC | PRN

## 2013-05-29 NOTE — Patient Instructions (Signed)
Continue with your aquatic therapy

## 2013-05-29 NOTE — Progress Notes (Signed)
Subjective:    Patient ID: [name], female    DOB: 1956-04-01, 57 y.o.   MRN: [name]  HPI Chief complaint is right hip pain.  The patient is a 57 year old female, who presents with right hip pain, in the groin, and left LBP . The symptoms started 2 years ago. The patient complains about moderate to severe pain .Patient denies numbness and tingling. She describes the pain as aching and sometimes sharp and stabbing. Taking medications , changing positions alleviate the symptoms. Prolonged standing or walking aggrevates the symptoms. The patient grades her pain as a 6/10.  Patient received right SI injection by Dr. Read Drivers on 03/07/13, which only helped for 1 day, after that she had the same pain, same severity as before.  Patient is doing aquatic therapy, which I ordered, now, which is helping a lot, and which she enjoys. She also reports that the Voltaren, I prescribed, is helping with her knee pain.  X-rays showing loss of joint space on the right side. Also loss of bone stock the right femoral neck and proximal femur. Hip replacement was discussed with patient however no guarantees were made in terms of success. Concerns were in regards to poor bone stock and the proximal femur area.  The patient also has a history of right knee replacement this was evaluated by orthopedics felt to be well positioned.   Pain Inventory Average Pain 7 Pain Right Now 5 My pain is sharp, dull and aching  In the last 24 hours, has pain interfered with the following? General activity 4 Relation with others 7 Enjoyment of life 7 What TIME of day is your pain at its worst? morning Sleep (in general) Fair  Pain is worse with: bending and standing Pain improves with: medication Relief from Meds: 5  Mobility use a cane how many minutes can you walk? 5-7 ability to climb steps?  yes do you drive?  yes Do you have any goals in this area?  yes  Function Do you have any goals in this area?   no  Neuro/Psych depression  Prior Studies Any changes since last visit?  no  Physicians involved in your care Any changes since last visit?  no   Family History  Problem Relation Age of Onset  . Cancer Mother   . Diabetes Mother   . Cancer Father    History   Social History  . Marital Status: Divorced    Spouse Name: N/A    Number of Children: N/A  . Years of Education: N/A   Social History Main Topics  . Smoking status: Current Every Day Smoker  . Smokeless tobacco: Never Used  . Alcohol Use: No  . Drug Use: No  . Sexual Activity: None   Other Topics Concern  . None   Social History Narrative  . None   Past Surgical History  Procedure Laterality Date  . Abdominal hysterectomy    . Total knee arthroplasty    . Cholecystectomy     Past Medical History  Diagnosis Date  . Osteoporosis   . Depression   . Hypertension   . Hypothyroid   . Anemia   . Obesity    BP 128/70  Pulse 94  Resp 18  Ht 5\' 5"  (1.651 m)  Wt 289 lb (131.09 kg)  BMI 48.09 kg/m2  SpO2 94%     Review of Systems  Musculoskeletal: Positive for back pain.  Psychiatric/Behavioral: Positive for dysphoric mood.  All other systems reviewed  and are negative.       Objective:   Physical Exam  Constitutional: She is oriented to person, place, and time. She appears well-developed and well-nourished.  Morbidly obese  HENT:  Head: Normocephalic.  Neck: Neck supple.  Musculoskeletal: She exhibits tenderness.  Neurological: She is alert and oriented to person, place, and time.  Skin: Skin is warm and dry.  Psychiatric: She has a normal mood and affect.  Symmetric normal motor tone is noted throughout. Normal muscle bulk. Muscle testing reveals 5/5 muscle strength of the upper extremity, and 5/5 of the lower extremity, except right iliopsoas 3/5, right quadriceps 4/5, and right glut med 4/5. Full range of motion in upper and lower extremities, except right knee, flex 80 degrees,  extension deficit of 15 degrees, right hip decreased ROM, with pain. ROM of spine is restricted. Fine motor movements are normal in both hands.  .  DTR in the upper and lower extremity are present and symmetric 2+, except right patella reflex 0. No clonus is noted.  Patient arises from chair with difficulty. Wide based gait with kyphotic posture , with a cane.       Assessment & Plan:  1. Chronic right hip pain. History of AVN with joint collapse. Also has localized bone cysts in the proximal femur area which has been evaluated by orthopedics and felt to be a marginal surgical candidate. This is the main pain at this time. She also has a leg length discrepancy which is causing some back pain paravertebral muscles on the left.  2. Right shoulder bursitis improving  3. Right knee contracture after total knee replacement  Continue with aquatic PT,which has helped and which she is enjoying, recommended aquatic exercises in a pool after PT. Also discussed a healthy lifestyle , and encouraged weight loss.Also continue with Voltaren gel for her knee pain, and Robaxin for her muscle spasms/pain in her lower back on the left side.  Refilled oxycodone 5 mg 3 times a day.  Follow up in one month

## 2013-06-06 DIAGNOSIS — M545 Low back pain: Secondary | ICD-10-CM | POA: Diagnosis not present

## 2013-06-06 DIAGNOSIS — M8700 Idiopathic aseptic necrosis of unspecified bone: Secondary | ICD-10-CM | POA: Diagnosis not present

## 2013-06-08 DIAGNOSIS — M545 Low back pain: Secondary | ICD-10-CM | POA: Diagnosis not present

## 2013-06-08 DIAGNOSIS — M8700 Idiopathic aseptic necrosis of unspecified bone: Secondary | ICD-10-CM | POA: Diagnosis not present

## 2013-06-27 ENCOUNTER — Encounter: Payer: Medicare Other | Admitting: Physical Medicine and Rehabilitation

## 2013-07-03 ENCOUNTER — Telehealth: Payer: Self-pay

## 2013-07-03 NOTE — Telephone Encounter (Signed)
Patient is requesting a refill on oxycodone. She said she has appt on Wed.but she is completely out because she missed her last appt due to being sick.

## 2013-07-03 NOTE — Telephone Encounter (Signed)
Patient advised she would need to be seen to get her oxycodone refill.  Her appointment has been rescheduled.  Patient aware.

## 2013-07-04 ENCOUNTER — Encounter: Payer: Self-pay | Admitting: Physical Medicine & Rehabilitation

## 2013-07-04 ENCOUNTER — Encounter: Payer: Medicare Other | Attending: Physical Medicine & Rehabilitation

## 2013-07-04 ENCOUNTER — Ambulatory Visit (HOSPITAL_BASED_OUTPATIENT_CLINIC_OR_DEPARTMENT_OTHER): Payer: Medicare Other | Admitting: Physical Medicine & Rehabilitation

## 2013-07-04 VITALS — BP 136/62 | HR 92 | Resp 14 | Ht 66.5 in | Wt 260.4 lb

## 2013-07-04 DIAGNOSIS — M810 Age-related osteoporosis without current pathological fracture: Secondary | ICD-10-CM | POA: Insufficient documentation

## 2013-07-04 DIAGNOSIS — M2170 Unequal limb length (acquired), unspecified site: Secondary | ICD-10-CM | POA: Diagnosis not present

## 2013-07-04 DIAGNOSIS — G894 Chronic pain syndrome: Secondary | ICD-10-CM | POA: Diagnosis not present

## 2013-07-04 DIAGNOSIS — M87059 Idiopathic aseptic necrosis of unspecified femur: Secondary | ICD-10-CM | POA: Insufficient documentation

## 2013-07-04 DIAGNOSIS — M549 Dorsalgia, unspecified: Secondary | ICD-10-CM | POA: Insufficient documentation

## 2013-07-04 DIAGNOSIS — I10 Essential (primary) hypertension: Secondary | ICD-10-CM | POA: Diagnosis not present

## 2013-07-04 DIAGNOSIS — E039 Hypothyroidism, unspecified: Secondary | ICD-10-CM | POA: Insufficient documentation

## 2013-07-04 DIAGNOSIS — F329 Major depressive disorder, single episode, unspecified: Secondary | ICD-10-CM | POA: Diagnosis not present

## 2013-07-04 DIAGNOSIS — E669 Obesity, unspecified: Secondary | ICD-10-CM | POA: Insufficient documentation

## 2013-07-04 DIAGNOSIS — M8700 Idiopathic aseptic necrosis of unspecified bone: Secondary | ICD-10-CM | POA: Diagnosis not present

## 2013-07-04 DIAGNOSIS — F3289 Other specified depressive episodes: Secondary | ICD-10-CM | POA: Insufficient documentation

## 2013-07-04 MED ORDER — OXYCODONE HCL 5 MG PO TABS: 5.0000 mg | ORAL_TABLET | Freq: Three times a day (TID) | ORAL | Status: DC | PRN

## 2013-07-04 NOTE — Patient Instructions (Addendum)
Aquatic exercise recommended  Cont current meds Discont Meloxicam and tramadol
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
## 2013-07-05 ENCOUNTER — Ambulatory Visit: Payer: Medicare Other | Admitting: Physical Medicine and Rehabilitation

## 2013-07-13 ENCOUNTER — Other Ambulatory Visit: Payer: Self-pay

## 2013-07-31 ENCOUNTER — Ambulatory Visit: Payer: Medicare Other | Attending: Internal Medicine | Admitting: Internal Medicine

## 2013-07-31 VITALS — BP 124/82 | HR 87 | Temp 98.6°F | Resp 16 | Ht 66.0 in | Wt 295.0 lb

## 2013-07-31 DIAGNOSIS — I10 Essential (primary) hypertension: Secondary | ICD-10-CM

## 2013-07-31 DIAGNOSIS — Z23 Encounter for immunization: Secondary | ICD-10-CM

## 2013-07-31 LAB — COMPLETE METABOLIC PANEL WITH GFR
AST: 70 U/L — ABNORMAL HIGH (ref 0–37)
Alkaline Phosphatase: 90 U/L (ref 39–117)
BUN: 12 mg/dL (ref 6–23)
Calcium: 9.1 mg/dL (ref 8.4–10.5)
Creat: 0.65 mg/dL (ref 0.50–1.10)
GFR, Est Non African American: >89 mL/min
Glucose, Bld: 98 mg/dL (ref 70–99)

## 2013-07-31 LAB — LIPID PANEL
Cholesterol: 153 mg/dL (ref 0–200)
Total CHOL/HDL Ratio: 3.6 Ratio
Triglycerides: 120 mg/dL (ref <150)
VLDL: 24 mg/dL (ref 0–40)

## 2013-07-31 MED ORDER — GUAIFENESIN-DM 100-10 MG/5ML PO SYRP: 5.0000 mL | ORAL_SOLUTION | ORAL | Status: DC | PRN

## 2013-07-31 MED ORDER — AMLODIPINE BESYLATE 5 MG PO TABS: 5.0000 mg | ORAL_TABLET | Freq: Every day | ORAL | Status: DC

## 2013-07-31 MED ORDER — FLUTICASONE PROPIONATE 50 MCG/ACT NA SUSP: 2.0000 | Freq: Every day | NASAL | Status: DC

## 2013-07-31 MED ORDER — DESLORATADINE 5 MG PO TBDP: 5.0000 mg | ORAL_TABLET | Freq: Every day | ORAL | Status: DC

## 2013-07-31 MED ORDER — LISINOPRIL 40 MG PO TABS: 40.0000 mg | ORAL_TABLET | Freq: Every day | ORAL | Status: DC

## 2013-07-31 NOTE — Progress Notes (Signed)
Patient ID: [name], female   DOB: 1956-05-17, 57 y.o.   MRN: [name]   CC:  HPI: 57 year old female who is here for a followup. She has a history of hepatitis C, hypertension, avascular necrosis of the right hip She has had an extensive evaluation by Charlett Blake, MD, and at [facility] and has been deemed not to be a surgical candidate. She currently ambulates with the help of a walker. She states that she has completed several rounds of physical therapy and has had not been able to continue with it because of insurance issues.  Her blood pressure is well controlled at home  she states that she's had a nonproductive cough, chest congestion. She is a smoker.    Allergies  Allergen Reactions  . Penicillins Anaphylaxis  . Demerol    Past Medical History  Diagnosis Date  . Osteoporosis   . Depression   . Hypertension   . Hypothyroid   . Anemia   . Obesity    Current Outpatient Prescriptions on File Prior to Visit  Medication Sig Dispense Refill  . clobetasol cream (TEMOVATE) 0.05 %       . diclofenac sodium (VOLTAREN) 1 % GEL Apply 2 g topically 4 (four) times daily.  2 Tube  2  . methocarbamol (ROBAXIN) 500 MG tablet Take 1 tablet (500 mg total) by mouth 3 (three) times daily.  60 tablet  1  . oxyCODONE (ROXICODONE) 5 MG immediate release tablet Take 1 tablet (5 mg total) by mouth every 8 (eight) hours as needed for pain.  90 tablet  0  . sertraline (ZOLOFT) 50 MG tablet Take 1.5 tablets (75 mg total) by mouth at bedtime.  30 tablet  3  . [DISCONTINUED] DULoxetine (CYMBALTA) 20 MG capsule Take 60 mg by mouth daily.       No current facility-administered medications on file prior to visit.   Family History  Problem Relation Age of Onset  . Cancer Mother   . Diabetes Mother   . Cancer Father    History   Social History  . Marital Status: Divorced    Spouse Name: N/A    Number of Children: N/A  . Years of Education: N/A   Occupational History   . Not on file.   Social History Main Topics  . Smoking status: Current Every Day Smoker  . Smokeless tobacco: Never Used  . Alcohol Use: No  . Drug Use: No  . Sexual Activity: Not on file   Other Topics Concern  . Not on file   Social History Narrative  . No narrative on file    Review of Systems  Constitutional: Negative for fever, chills, diaphoresis, activity change, appetite change and fatigue.  HENT: Negative for ear pain, nosebleeds, congestion, facial swelling, rhinorrhea, neck pain, neck stiffness and ear discharge.   Eyes: Negative for pain, discharge, redness, itching and visual disturbance.  Respiratory: Negative for cough, choking, chest tightness, shortness of breath, wheezing and stridor.   Cardiovascular: Negative for chest pain, palpitations and leg swelling.  Gastrointestinal: Negative for abdominal distention.  Genitourinary: Negative for dysuria, urgency, frequency, hematuria, flank pain, decreased urine volume, difficulty urinating and dyspareunia.  Musculoskeletal: Negative for back pain, joint swelling, arthralgias and gait problem.  Neurological: Negative for dizziness, tremors, seizures, syncope, facial asymmetry, speech difficulty, weakness, light-headedness, numbness and headaches.  Hematological: Negative for adenopathy. Does not bruise/bleed easily.  Psychiatric/Behavioral: Negative for hallucinations, behavioral problems, confusion, dysphoric mood, decreased concentration and agitation.  Objective:  There were no vitals filed for this visit.  Physical Exam  Constitutional: Appears well-developed and well-nourished. No distress.  HENT: Normocephalic. External right and left ear normal. Oropharynx is clear and moist.  Eyes: Conjunctivae and EOM are normal. PERRLA, no scleral icterus.  Neck: Normal ROM. Neck supple. No JVD. No tracheal deviation. No thyromegaly.  CVS: RRR, S1/S2 +, no murmurs, no gallops, no carotid bruit.  Pulmonary: Effort and  breath sounds normal, no stridor, rhonchi, wheezes, rales.  Abdominal: Soft. BS +,  no distension, tenderness, rebound or guarding.  Musculoskeletal: Normal range of motion. No edema and no tenderness.  Lymphadenopathy: No lymphadenopathy noted, cervical, inguinal. Neuro: Alert. Normal reflexes, muscle tone coordination. No cranial nerve deficit. Skin: Skin is warm and dry. No rash noted. Not diaphoretic. No erythema. No pallor.  Psychiatric: Normal mood and affect. Behavior, judgment, thought content normal.   Lab Results  Component Value Date   WBC 9.1 12/29/2012   HGB 13.6 12/29/2012   HCT 39.1 12/29/2012   MCV 89.3 12/29/2012   PLT 112* 12/29/2012   Lab Results  Component Value Date   CREATININE 0.51 12/29/2012   BUN 10 12/29/2012   NA 139 12/29/2012   K 3.4* 12/29/2012   CL 106 12/29/2012   CO2 24 12/29/2012    Lab Results  Component Value Date   HGBA1C 5.7* 09/01/2012   Lipid Panel     Component Value Date/Time   CHOL 152 12/29/2012 1100   TRIG 115 12/29/2012 1100   HDL 40 12/29/2012 1100   CHOLHDL 3.8 12/29/2012 1100   VLDL 23 12/29/2012 1100   LDLCALC 89 12/29/2012 1100       Assessment and plan:   Patient Active Problem List   Diagnosis Date Noted  . AVN (avascular necrosis of bone) 04/14/2013  . Leg length discrepancy 04/14/2013  . HTN (hypertension) 03/28/2013  . Elevated liver enzymes 01/01/2013  . CLAUDICATION 04/29/2009  . HYPOTHYROIDISM 04/25/2009  . THROMBOCYTOPENIA 04/25/2009   Allergic/viral bronchitis Patient continues to smoke Smoking cessation counseling done Patient prescribed Flonase, Clarinex, Robitussin-DM   Chronic hip pain patient to continue with Robaxin, oxycodone , Hypertension refilled lisinopril and Norvasc, will obtain baseline labs today   Depression patient is on Zoloft, and goes  to [facility]  Followup in 3 months      The patient was given clear instructions to go to ER or return to medical center if symptoms don't improve,  worsen or new problems develop. The patient verbalized understanding. The patient was told to call to get any lab results if not heard anything in the next week.

## 2013-08-01 ENCOUNTER — Encounter
Payer: Medicare Other | Attending: Physical Medicine and Rehabilitation | Admitting: Physical Medicine and Rehabilitation

## 2013-08-01 ENCOUNTER — Ambulatory Visit: Payer: Medicare Other | Admitting: Internal Medicine

## 2013-08-01 ENCOUNTER — Encounter: Payer: Self-pay | Admitting: Physical Medicine and Rehabilitation

## 2013-08-01 VITALS — BP 150/84 | HR 93 | Resp 14 | Ht 66.5 in | Wt 296.0 lb

## 2013-08-01 DIAGNOSIS — G8929 Other chronic pain: Secondary | ICD-10-CM | POA: Insufficient documentation

## 2013-08-01 DIAGNOSIS — M2170 Unequal limb length (acquired), unspecified site: Secondary | ICD-10-CM

## 2013-08-01 DIAGNOSIS — M8569 Other cyst of bone, multiple sites: Secondary | ICD-10-CM | POA: Insufficient documentation

## 2013-08-01 DIAGNOSIS — Z79899 Other long term (current) drug therapy: Secondary | ICD-10-CM | POA: Diagnosis not present

## 2013-08-01 DIAGNOSIS — Z96651 Presence of right artificial knee joint: Secondary | ICD-10-CM

## 2013-08-01 DIAGNOSIS — M87059 Idiopathic aseptic necrosis of unspecified femur: Secondary | ICD-10-CM | POA: Diagnosis not present

## 2013-08-01 DIAGNOSIS — M25559 Pain in unspecified hip: Secondary | ICD-10-CM | POA: Insufficient documentation

## 2013-08-01 DIAGNOSIS — Z5181 Encounter for therapeutic drug level monitoring: Secondary | ICD-10-CM

## 2013-08-01 DIAGNOSIS — M719 Bursopathy, unspecified: Secondary | ICD-10-CM | POA: Insufficient documentation

## 2013-08-01 DIAGNOSIS — G894 Chronic pain syndrome: Secondary | ICD-10-CM

## 2013-08-01 DIAGNOSIS — M24569 Contracture, unspecified knee: Secondary | ICD-10-CM | POA: Insufficient documentation

## 2013-08-01 DIAGNOSIS — Z96659 Presence of unspecified artificial knee joint: Secondary | ICD-10-CM | POA: Diagnosis not present

## 2013-08-01 DIAGNOSIS — M67919 Unspecified disorder of synovium and tendon, unspecified shoulder: Secondary | ICD-10-CM | POA: Diagnosis not present

## 2013-08-01 DIAGNOSIS — M8700 Idiopathic aseptic necrosis of unspecified bone: Secondary | ICD-10-CM

## 2013-08-01 DIAGNOSIS — M2190 Unspecified acquired deformity of unspecified limb: Secondary | ICD-10-CM | POA: Diagnosis not present

## 2013-08-01 MED ORDER — OXYCODONE HCL 5 MG PO TABS: 5.0000 mg | ORAL_TABLET | Freq: Three times a day (TID) | ORAL | Status: DC | PRN

## 2013-08-01 NOTE — Patient Instructions (Addendum)
Stay as active as pain permits. Try to get into the [employer] program, to be able to do aquatic exercises in a Y

## 2013-08-01 NOTE — Progress Notes (Signed)
Subjective:    Patient ID: [name], female    DOB: Jul 12, 1956, 57 y.o.   MRN: [name]  HPI Chief complaint is right hip pain.  The patient is a 57 year old female, who presents with right hip pain, in the groin, and left LBP . The symptoms started 2 years ago. The patient complains about moderate to severe pain .Patient denies numbness and tingling. She describes the pain as aching and sometimes sharp and stabbing. Taking medications , changing positions alleviate the symptoms. Prolonged standing or walking aggrevates the symptoms. The patient grades her pain as a 6/10.  Patient received right SI injection by Dr. Read Drivers on 03/07/13, which only helped for 1 day, after that she had the same pain, same severity as before.  Patient has stopped aquatic therapy, because of financial reasons.  She also reports that the Voltaren, I prescribed, is helping with her knee pain.   X-rays showing loss of joint space on the right side. Also loss of bone stock the right femoral neck and proximal femur. Hip replacement was discussed with patient however no guarantees were made in terms of success. Concerns were in regards to poor bone stock and the proximal femur area.  The patient also has a history of right knee replacement this was evaluated by orthopedics felt to be well positioned.   Pain Inventory Average Pain 7 Pain Right Now 8 My pain is sharp, stabbing and aching  In the last 24 hours, has pain interfered with the following? General activity 8 Relation with others 8 Enjoyment of life 9 What TIME of day is your pain at its worst? morning Sleep (in general) Fair  Pain is worse with: walking, bending, inactivity and standing Pain improves with: rest and medication Relief from Meds: 5  Mobility walk with assistance use a cane use a walker how many minutes can you walk? 5 ability to climb steps?  yes do you drive?  yes transfers alone Do you have any goals in this area?   yes  Function I need assistance with the following:  household duties and shopping  Neuro/Psych weakness trouble walking depression  Prior Studies Any changes since last visit?  no  Physicians involved in your care Any changes since last visit?  no   Family History  Problem Relation Age of Onset  . Cancer Mother   . Diabetes Mother   . Cancer Father    History   Social History  . Marital Status: Divorced    Spouse Name: N/A    Number of Children: N/A  . Years of Education: N/A   Social History Main Topics  . Smoking status: Current Every Day Smoker  . Smokeless tobacco: Never Used  . Alcohol Use: No  . Drug Use: No  . Sexual Activity: None   Other Topics Concern  . None   Social History Narrative  . None   Past Surgical History  Procedure Laterality Date  . Abdominal hysterectomy    . Total knee arthroplasty    . Cholecystectomy     Past Medical History  Diagnosis Date  . Osteoporosis   . Depression   . Hypertension   . Hypothyroid   . Anemia   . Obesity    BP 150/84  Pulse 93  Resp 14  Ht 5' 6.5" (1.689 m)  Wt 296 lb (134.265 kg)  BMI 47.07 kg/m2  SpO2 95%     Review of Systems  Constitutional: Positive for diaphoresis.  Musculoskeletal: Positive  for gait problem.  Neurological: Positive for weakness.  Psychiatric/Behavioral: Positive for dysphoric mood.  All other systems reviewed and are negative.       Objective:   Physical Exam Constitutional: She is oriented to person, place, and time. She appears well-developed and well-nourished.  Morbidly obese  HENT:  Head: Normocephalic.  Neck: Neck supple.  Musculoskeletal: She exhibits tenderness.  Neurological: She is alert and oriented to person, place, and time.  Skin: Skin is warm and dry.  Psychiatric: She has a normal mood and affect.  Symmetric normal motor tone is noted throughout. Normal muscle bulk. Muscle testing reveals 5/5 muscle strength of the upper extremity, and  5/5 of the lower extremity, except right iliopsoas 3/5, right quadriceps 4/5, and right glut med 4/5. Full range of motion in upper and lower extremities, except right knee, flex 80 degrees, extension deficit of 15 degrees, right hip decreased ROM, with pain. ROM of spine is restricted. Fine motor movements are normal in both hands.  .  DTR in the upper and lower extremity are present and symmetric 2+, except right patella reflex 0. No clonus is noted.  Patient arises from chair with difficulty. Wide based gait with kyphotic posture , with a cane.        Assessment & Plan:  1. Chronic right hip pain. History of AVN with joint collapse. Also has localized bone cysts in the proximal femur area which has been evaluated by orthopedics and felt to be a marginal surgical candidate. This is the main pain at this time. She also has a leg length discrepancy which is causing some back pain paravertebral muscles on the left.  2. Right shoulder bursitis improving  3. Right knee contracture after total knee replacement  Has stopped with aquatic PT,which was too expensive . Also discussed a healthy lifestyle , and encouraged weight loss.Also continue with Voltaren gel for her knee pain, and Robaxin for her muscle spasms/pain in her lower back on the left side.  Refilled oxycodone 5 mg 3 times a day.  Follow up in one month

## 2013-08-24 ENCOUNTER — Ambulatory Visit: Payer: Medicare Other | Admitting: Physical Medicine and Rehabilitation

## 2013-09-21 ENCOUNTER — Encounter
Payer: Medicare Other | Attending: Physical Medicine and Rehabilitation | Admitting: Physical Medicine and Rehabilitation

## 2013-09-21 ENCOUNTER — Encounter: Payer: Self-pay | Admitting: Physical Medicine and Rehabilitation

## 2013-09-21 VITALS — BP 147/81 | HR 85 | Resp 14 | Ht 66.0 in | Wt 302.0 lb

## 2013-09-21 DIAGNOSIS — S23429A Unspecified sprain of sternum, initial encounter: Secondary | ICD-10-CM | POA: Insufficient documentation

## 2013-09-21 DIAGNOSIS — Z79899 Other long term (current) drug therapy: Secondary | ICD-10-CM | POA: Insufficient documentation

## 2013-09-21 DIAGNOSIS — S29011A Strain of muscle and tendon of front wall of thorax, initial encounter: Secondary | ICD-10-CM

## 2013-09-21 DIAGNOSIS — G894 Chronic pain syndrome: Secondary | ICD-10-CM | POA: Diagnosis not present

## 2013-09-21 DIAGNOSIS — M169 Osteoarthritis of hip, unspecified: Secondary | ICD-10-CM

## 2013-09-21 DIAGNOSIS — IMO0002 Reserved for concepts with insufficient information to code with codable children: Secondary | ICD-10-CM | POA: Diagnosis not present

## 2013-09-21 DIAGNOSIS — M25559 Pain in unspecified hip: Secondary | ICD-10-CM | POA: Diagnosis not present

## 2013-09-21 DIAGNOSIS — M1610 Unilateral primary osteoarthritis, unspecified hip: Secondary | ICD-10-CM | POA: Diagnosis not present

## 2013-09-21 DIAGNOSIS — M545 Low back pain, unspecified: Secondary | ICD-10-CM | POA: Insufficient documentation

## 2013-09-21 DIAGNOSIS — G8929 Other chronic pain: Secondary | ICD-10-CM | POA: Diagnosis not present

## 2013-09-21 DIAGNOSIS — X58XXXA Exposure to other specified factors, initial encounter: Secondary | ICD-10-CM | POA: Insufficient documentation

## 2013-09-21 DIAGNOSIS — M1611 Unilateral primary osteoarthritis, right hip: Secondary | ICD-10-CM

## 2013-09-21 MED ORDER — METHOCARBAMOL 500 MG PO TABS: 500.0000 mg | ORAL_TABLET | Freq: Three times a day (TID) | ORAL | Status: DC

## 2013-09-21 MED ORDER — OXYCODONE HCL 5 MG PO TABS: 5.0000 mg | ORAL_TABLET | Freq: Three times a day (TID) | ORAL | Status: DC | PRN

## 2013-09-21 NOTE — Patient Instructions (Signed)
Continue with ice to right rib area. Alternate with heat. Use robaxin three times a day for a week then decrease to as needed for right chest wall strain.

## 2013-09-21 NOTE — Progress Notes (Signed)
Subjective: Follow up on right hip pain. Right chest wall muscle strain.     Patient ID: [name], female    DOB: 27-Jun-1956, 58 y.o.   MRN: [id number]  HPI The patient is a 58 year old female, who presents with right hip pain, in the groin, and left LBP . The symptoms started 2 years ago. The patient complains about moderate to severe pain .Patient denies numbness and tingling. She describes the pain as aching and sometimes dull and aching. Taking medications and  changing positions alleviate the symptoms. Prolonged standing or walking aggrevates the symptoms. The patient grades her pain as 7/10. SI injection has been ineffective. She has tried wean down to one oxycodone daily to help decrease  .  She found her brother down two weeks ago and had to move him help get him to a safe position to open his airway.  She has had right chest wall pain since then and has been applying ice to the area. No cough, SOB or chest pain reported. This has been sore since then.     Pain Inventory Average Pain 7 Pain Right Now 8 My pain is sharp, dull and aching  In the last 24 hours, has pain interfered with the following? General activity 8 Relation with others 8 Enjoyment of life 8 What TIME of day is your pain at its worst? morning Sleep (in general) Fair  Pain is worse with: walking, bending, sitting, inactivity and standing Pain improves with: rest and medication Relief from Meds: 7  Mobility walk with assistance use a cane how many minutes can you walk? 5 ability to climb steps?  yes do you drive?  yes transfers alone Do you have any goals in this area?  yes  Function Do you have any goals in this area?  no  Neuro/Psych depression anxiety  Prior Studies Any changes since last visit?  no  Physicians involved in your care Any changes since last visit?  no   Family History  Problem Relation Age of Onset  . Cancer Mother   . Diabetes Mother   . Cancer Father    History     Social History  . Marital Status: Divorced    Spouse Name: N/A    Number of Children: N/A  . Years of Education: N/A   Social History Main Topics  . Smoking status: Current Every Day Smoker  . Smokeless tobacco: Never Used  . Alcohol Use: No  . Drug Use: No  . Sexual Activity: None   Other Topics Concern  . None   Social History Narrative  . None   Past Surgical History  Procedure Laterality Date  . Abdominal hysterectomy    . Total knee arthroplasty    . Cholecystectomy     Past Medical History  Diagnosis Date  . Osteoporosis   . Depression   . Hypertension   . Hypothyroid   . Anemia   . Obesity    BP 147/81  Pulse 85  Resp 14  Ht 5\' 6"  (1.676 m)  Wt 302 lb (136.986 kg)  BMI 48.77 kg/m2  SpO2 97%      Review of Systems  Constitutional: Positive for diaphoresis.  Musculoskeletal: Positive for back pain.  Skin: Positive for rash.  Psychiatric/Behavioral: Positive for dysphoric mood. The patient is nervous/anxious.        Objective:   Physical Exam  Nursing note and vitals reviewed. Constitutional: She is oriented to person, place, and time. She  appears well-developed and well-nourished.  Obese female with raspy voice. Pleasant and appropriate. Rubbing and favoring right chest area.   HENT:  Head: Normocephalic and atraumatic.  Eyes: Conjunctivae are normal. Pupils are equal, round, and reactive to light.  Neck: Normal range of motion.  Cardiovascular: Normal rate and regular rhythm.   Pulmonary/Chest: Effort normal. No respiratory distress. She has decreased breath sounds. She has no wheezes.    Right lateral chest wall tenderness.   Abdominal: Soft.  Musculoskeletal: She exhibits tenderness. She exhibits no edema.  Right SI and hip tenderness to palpation. Decreased ROM right hip and right knee. Muscle testing reveals 5/5 muscle strength of the upper extremity, and 5/5 of the left lower extremity. Fine motor movements are normal in both hands.     Get up from chair with moderate difficulty. Uses cane for support--does not have support shoe on today. Walks with flexed posture with antalgic gait. Favoring right leg.     Neurological: She is alert and oriented to person, place, and time.  Skin: Skin is warm and dry.  Psychiatric: She has a normal mood and affect. Her speech is normal and behavior is normal. Judgment and thought content normal. Cognition and memory are normal.          Assessment & Plan:  1. Chronic right hip pain. History of AVN with joint collapse: She has been evaluated by orthopedics and felt to be a marginal surgical candidate. This is the main pain at this time. She also has a leg length discrepancy which is causing some back pain paravertebral muscles. She is not using her shoe lift as it throws her balance off and puts her at risk for fall.  Refilled: Oxycodone 5mg --one pill tid prn pain # 90 Rx.   2. Right chest wall strain: advised her to alternate heat with ice. Use robaxin 500 mg tid scheduled for a week then taper to tid prn.  Refilled: Robaxin 500 mg # 60/ one refill ----Use one po tid prn.

## 2013-10-26 ENCOUNTER — Encounter
Payer: Medicare Other | Attending: Physical Medicine and Rehabilitation | Admitting: Physical Medicine and Rehabilitation

## 2013-10-26 ENCOUNTER — Encounter: Payer: Self-pay | Admitting: Physical Medicine and Rehabilitation

## 2013-10-26 VITALS — BP 130/73 | HR 106 | Resp 14 | Ht 66.0 in | Wt 299.0 lb

## 2013-10-26 DIAGNOSIS — G8929 Other chronic pain: Secondary | ICD-10-CM | POA: Diagnosis not present

## 2013-10-26 DIAGNOSIS — M545 Low back pain, unspecified: Secondary | ICD-10-CM | POA: Insufficient documentation

## 2013-10-26 DIAGNOSIS — M1610 Unilateral primary osteoarthritis, unspecified hip: Secondary | ICD-10-CM

## 2013-10-26 DIAGNOSIS — R279 Unspecified lack of coordination: Secondary | ICD-10-CM | POA: Insufficient documentation

## 2013-10-26 DIAGNOSIS — M169 Osteoarthritis of hip, unspecified: Secondary | ICD-10-CM

## 2013-10-26 DIAGNOSIS — Z7409 Other reduced mobility: Secondary | ICD-10-CM

## 2013-10-26 DIAGNOSIS — E039 Hypothyroidism, unspecified: Secondary | ICD-10-CM | POA: Insufficient documentation

## 2013-10-26 DIAGNOSIS — G894 Chronic pain syndrome: Secondary | ICD-10-CM | POA: Diagnosis not present

## 2013-10-26 DIAGNOSIS — M25559 Pain in unspecified hip: Secondary | ICD-10-CM | POA: Insufficient documentation

## 2013-10-26 DIAGNOSIS — M810 Age-related osteoporosis without current pathological fracture: Secondary | ICD-10-CM | POA: Diagnosis not present

## 2013-10-26 DIAGNOSIS — M2170 Unequal limb length (acquired), unspecified site: Secondary | ICD-10-CM | POA: Insufficient documentation

## 2013-10-26 DIAGNOSIS — Z789 Other specified health status: Secondary | ICD-10-CM | POA: Diagnosis not present

## 2013-10-26 DIAGNOSIS — I10 Essential (primary) hypertension: Secondary | ICD-10-CM | POA: Insufficient documentation

## 2013-10-26 DIAGNOSIS — F4320 Adjustment disorder, unspecified: Secondary | ICD-10-CM | POA: Insufficient documentation

## 2013-10-26 DIAGNOSIS — M87059 Idiopathic aseptic necrosis of unspecified femur: Secondary | ICD-10-CM | POA: Insufficient documentation

## 2013-10-26 DIAGNOSIS — F17200 Nicotine dependence, unspecified, uncomplicated: Secondary | ICD-10-CM | POA: Diagnosis not present

## 2013-10-26 DIAGNOSIS — M1611 Unilateral primary osteoarthritis, right hip: Secondary | ICD-10-CM

## 2013-10-26 MED ORDER — OXYCODONE HCL 5 MG PO TABS: 5.0000 mg | ORAL_TABLET | Freq: Three times a day (TID) | ORAL | Status: DC | PRN

## 2013-10-26 MED ORDER — METHOCARBAMOL 500 MG PO TABS: 500.0000 mg | ORAL_TABLET | Freq: Three times a day (TID) | ORAL | Status: DC

## 2013-10-26 MED ORDER — DICLOFENAC SODIUM 1 % TD GEL: 2.0000 g | Freq: Four times a day (QID) | TRANSDERMAL | Status: DC

## 2013-10-26 NOTE — Progress Notes (Signed)
Subjective:    Patient ID: [name], female    DOB: Nov 29, 1955, 58 y.o.   MRN: [name]  HPI  [name] is a 58 year old female, who is here for follow up on chronic right hip pain that occasionally radiated to the groin, as well as chronic LBP. Chest wall pain has resolved. Right shoulder pain from fall about six months ago still bothers her on and off due to use of BUE to help push in and out of chair. She is worn out due to helping take care/advocate for her  Brother (with mental illness)  who has endstage liver disease and is back in the hospital.    Cold weather has exacerbated hip pain and having to walk long halls in the hospital has not helped. She has not had any falls in the past 6 months but reports ability to walk is impaired. She is unable to walk the length of her home without stopping to rest and had to take three rest breaks on her way up to the office due to hip discomfort and feeling of instability. She is frustrated by this as well as need for lifestyle changes. Sons to help discuss ramp placement as well as help clear her garden of her rose bushes as she is not able to maintain her yard.    She continues to smoke 1/2 PPD and not willing (or able) to think about quitting at current time.   Pain Inventory Average Pain 8 Pain Right Now 7 My pain is sharp, burning and aching  In the last 24 hours, has pain interfered with the following? General activity 9 Relation with others 8 Enjoyment of life 10 What TIME of day is your pain at its worst? daytime Sleep (in general) Fair  Pain is worse with: walking, bending, sitting, inactivity and standing Pain improves with: rest and medication Relief from Meds: 6  Mobility walk with assistance use a cane use a walker how many minutes can you walk? 3 ability to climb steps?  no do you drive?  yes Do you have any goals in this area?  no  Function Do you have any goals in this area?   no  Neuro/Psych depression  Prior Studies Any changes since last visit?  no  Physicians involved in your care Any changes since last visit?  no   Family History  Problem Relation Age of Onset  . Cancer Mother   . Diabetes Mother   . Cancer Father    History   Social History  . Marital Status: Divorced    Spouse Name: N/A    Number of Children: N/A  . Years of Education: N/A   Social History Main Topics  . Smoking status: Current Every Day Smoker  . Smokeless tobacco: Never Used  . Alcohol Use: No  . Drug Use: No  . Sexual Activity: None   Other Topics Concern  . None   Social History Narrative  . None   Past Surgical History  Procedure Laterality Date  . Abdominal hysterectomy    . Total knee arthroplasty    . Cholecystectomy     Past Medical History  Diagnosis Date  . Osteoporosis   . Depression   . Hypertension   . Hypothyroid   . Anemia   . Obesity    BP 130/73  Pulse 106  Resp 14  Ht 5\' 6"  (1.676 m)  Wt 299 lb (135.626 kg)  BMI 48.28 kg/m2  SpO2  98%  Opioid Risk Score:   Fall Risk Score: Moderate Fall Risk (6-13 points) (pt educated on fall risk, brochure declined)    Review of Systems  Psychiatric/Behavioral: Positive for dysphoric mood.  All other systems reviewed and are negative.       Objective:   Physical Exam  Nursing note and vitals reviewed. Constitutional: She is oriented to person, place, and time. She appears well-developed and well-nourished.  Morbidly obese female. Faint odor of tobacco  HENT:  Head: Normocephalic and atraumatic.  Eyes: Conjunctivae are normal. Pupils are equal, round, and reactive to light.  Neck: Normal range of motion. Neck supple.  Cardiovascular: Normal rate and regular rhythm.   Pulmonary/Chest: Effort normal. She has wheezes (audible upper airway wheezes).  Abdominal: Soft. Bowel sounds are normal.  Musculoskeletal:  Obvious leg length discrepancy with muscle wasting right thigh.   Right SI  and hip tenderness to palpation. Decreased ROM right hip and right knee. Muscle testing reveals 5/5 muscle strength of BUE/LLE. Fine motor movements are normal in both hands.    Get up from chair with significant difficulty today. She walks with rightward flexed posture and antalgic gait RLE.    Neurological: She is alert and oriented to person, place, and time.  Skin: Skin is warm and dry.          Assessment & Plan:  1. Chronic right hip pain due to AVN with joint collapse: Pain is reasonably controlled with oxycodone tid, Voltaren gel  as well as robaxin prn.  Refilled: Oxycodone 5 mg #90--use one pill every 8 hours prn pain.                Robaxin 500 mg #60--use one pill tid prn.     Voltaren 2 gram # 2 tubes --use qid.   2. Low back pain: Due to RLE leg length discrepancy. Built up shoe make her feel off balance and "throws her hip out" and caused her to fall. She was told not to use it by her orthopedist.   Continue to use pain medication as needed.   3. Balance deficits with poor endurance: She is concerned about her balance and is open to idea of PT to help with this. She would also like a wheelchair to use for activities/chores out of home. She was advised not to use it in home and plans on keeping it in her car.  Rx written for outpatient therapy as well as wheelchair.   4. Adjustment reaction: Ego support provided regarding brother's health issues as well as life style changes. She continues on her zoloft 150 mg daily and is not interested in increasing the dose. She was advised to call us or PMD for titration of dosage if symptoms do not improve.

## 2013-11-23 ENCOUNTER — Encounter
Payer: Medicare Other | Attending: Physical Medicine and Rehabilitation | Admitting: Physical Medicine and Rehabilitation

## 2013-11-23 ENCOUNTER — Encounter: Payer: Self-pay | Admitting: Physical Medicine and Rehabilitation

## 2013-11-23 VITALS — BP 133/59 | HR 79 | Resp 14 | Ht 66.0 in | Wt 299.0 lb

## 2013-11-23 DIAGNOSIS — I10 Essential (primary) hypertension: Secondary | ICD-10-CM | POA: Diagnosis not present

## 2013-11-23 DIAGNOSIS — M1610 Unilateral primary osteoarthritis, unspecified hip: Secondary | ICD-10-CM | POA: Insufficient documentation

## 2013-11-23 DIAGNOSIS — G894 Chronic pain syndrome: Secondary | ICD-10-CM | POA: Diagnosis not present

## 2013-11-23 DIAGNOSIS — M1611 Unilateral primary osteoarthritis, right hip: Secondary | ICD-10-CM

## 2013-11-23 DIAGNOSIS — M545 Low back pain, unspecified: Secondary | ICD-10-CM | POA: Diagnosis not present

## 2013-11-23 DIAGNOSIS — M169 Osteoarthritis of hip, unspecified: Secondary | ICD-10-CM

## 2013-11-23 DIAGNOSIS — M87059 Idiopathic aseptic necrosis of unspecified femur: Secondary | ICD-10-CM | POA: Diagnosis not present

## 2013-11-23 DIAGNOSIS — F17200 Nicotine dependence, unspecified, uncomplicated: Secondary | ICD-10-CM | POA: Diagnosis not present

## 2013-11-23 DIAGNOSIS — E039 Hypothyroidism, unspecified: Secondary | ICD-10-CM | POA: Insufficient documentation

## 2013-11-23 DIAGNOSIS — Z789 Other specified health status: Secondary | ICD-10-CM | POA: Diagnosis not present

## 2013-11-23 DIAGNOSIS — G8929 Other chronic pain: Secondary | ICD-10-CM | POA: Diagnosis not present

## 2013-11-23 DIAGNOSIS — Z7409 Other reduced mobility: Secondary | ICD-10-CM

## 2013-11-23 DIAGNOSIS — M25559 Pain in unspecified hip: Secondary | ICD-10-CM | POA: Diagnosis not present

## 2013-11-23 MED ORDER — OXYCODONE HCL 5 MG PO TABS: 5.0000 mg | ORAL_TABLET | Freq: Three times a day (TID) | ORAL | Status: DC | PRN

## 2013-11-23 NOTE — Progress Notes (Signed)
Subjective:    Patient ID: [name], female    DOB: 08-21-1956, 58 y.o.   MRN: [name]  HPI [name] is a 58 year old female, who is here for pain medication refill. She is her for follow up on chronic right hip pain that occasionally radiates to the groin, as well as chronic LBP.  Her mood is greatly improved with brighter affect. Her brother is doing better and her sister has taken over being the primary caregiver. She denies any increase in pain or muscle aches despite today's damp, cold, rainy weather. She rates her pain as 8/10 on the average and reports 5/10 relief from medications.     Pain Inventory Average Pain 8 Pain Right Now 7 My pain is sharp, burning and aching  In the last 24 hours, has pain interfered with the following? General activity 8 Relation with others 8 Enjoyment of life 8 What TIME of day is your pain at its worst? morning Sleep (in general) Fair  Pain is worse with: walking, bending, sitting, inactivity and standing Pain improves with: rest and medication Relief from Meds: 5  Mobility use a cane how many minutes can you walk? 5 do you drive?  yes  Function I need assistance with the following:  household duties and shopping  Neuro/Psych depression  Prior Studies Any changes since last visit?  no  Physicians involved in your care Any changes since last visit?  no   Family History  Problem Relation Age of Onset  . Cancer Mother   . Diabetes Mother   . Cancer Father    History   Social History  . Marital Status: Divorced    Spouse Name: N/A    Number of Children: N/A  . Years of Education: N/A   Social History Main Topics  . Smoking status: Current Every Day Smoker  . Smokeless tobacco: Never Used  . Alcohol Use: No  . Drug Use: No  . Sexual Activity: None   Other Topics Concern  . None   Social History Narrative  . None   Past Surgical History  Procedure Laterality Date  . Abdominal hysterectomy    .  Total knee arthroplasty    . Cholecystectomy     Past Medical History  Diagnosis Date  . Osteoporosis   . Depression   . Hypertension   . Hypothyroid   . Anemia   . Obesity    BP 133/59  Pulse 79  Resp 14  Ht 5\' 6"  (1.676 m)  Wt 299 lb (135.626 kg)  BMI 48.28 kg/m2  SpO2 96%  Opioid Risk Score:   Fall Risk Score: Moderate Fall Risk (6-13 points) (educated and handout given at previous visit)  Review of Systems  Constitutional: Positive for unexpected weight change.  Musculoskeletal: Positive for back pain.  Psychiatric/Behavioral: Positive for dysphoric mood.  All other systems reviewed and are negative.       Objective:   Physical Exam  Nursing note and vitals reviewed. Constitutional: She is oriented to person, place, and time. She appears well-developed and well-nourished.  Morbidly obese female. Has on a coordinated ensemble and looks brighter today.   HENT:  Head: Normocephalic and atraumatic.  Eyes: Conjunctivae are normal.  Neck: Normal range of motion.  Cardiovascular: Normal rate and regular rhythm.   Pulmonary/Chest: Effort normal and breath sounds normal. No respiratory distress. She has no wheezes.  Abdominal: Soft. Bowel sounds are normal. She exhibits no distension.  Musculoskeletal:  Has  obvious right leg length discrepancy with muscle wasting right thigh.   Has tenderness at lumbar spine, riht  SI joint as well as right hip to palpation. Has decreased ROM right hip and right knee. Muscle testing reveals 5/5 muscle strength of BUE/LLE. Fine motor movements are normal in both hands.    Get up from chair with difficulty and  walks with rightward flexed posture.  She walks with antalgic gait RLE.  Neurological: She is alert and oriented to person, place, and time.  Psychiatric: Her speech is normal and behavior is normal. Thought content normal. Her mood appears not anxious. Her affect is not angry. Cognition and memory are normal.            Assessment & Plan:  1. Chronic right hip pain due to AVN with joint collapse: Pain is reasonably controlled--continue  oxycodone tid, Voltaren gel as well as robaxin prn. Has refills on voltaren and robaxin.  Refilled: Oxycodone 5 mg #90--use one pill every 8 hours prn pain.    2. Low back pain: Due to RLE leg length discrepancy. She would like to have something to help with this.  Advised her to go back to [employer] to have them adjust her built up shoe.  3. Balance deficits with poor endurance: Outpatient PT has contacted her and she plans on setting up an appointment in the next two weeks. She hasn't filled her Rx for wheelchair yet.     4. Mood:  She seems better adjusted today. Sons to come in town for birthday dinner. Her family is pitching in to help with her brother and this has lifted a great burden for her.   5. H/o Vitamin D deficiency:  This was supplemented years ago but not on any supplement at this time. Advised her to start Vitamin D 2,000 units daily. Also suggested trial of tumeric bid for bone health---advised her to monitor for low BS.

## 2013-11-23 NOTE — Patient Instructions (Addendum)
Tumeric pill twice a day to help with bone pain.  May decrease blood sugar so carry a small snack with you.  Vitamin D 2,000 units daily.

## 2013-12-28 ENCOUNTER — Encounter: Payer: Self-pay | Admitting: Registered Nurse

## 2013-12-28 ENCOUNTER — Ambulatory Visit: Payer: Medicare Other | Admitting: Physical Medicine and Rehabilitation

## 2013-12-28 ENCOUNTER — Encounter: Payer: Medicare Other | Attending: Physical Medicine and Rehabilitation | Admitting: Registered Nurse

## 2013-12-28 VITALS — BP 173/71 | HR 85 | Resp 16 | Ht 66.5 in | Wt 299.0 lb

## 2013-12-28 DIAGNOSIS — Z5181 Encounter for therapeutic drug level monitoring: Secondary | ICD-10-CM

## 2013-12-28 DIAGNOSIS — M8700 Idiopathic aseptic necrosis of unspecified bone: Secondary | ICD-10-CM | POA: Diagnosis not present

## 2013-12-28 DIAGNOSIS — X58XXXA Exposure to other specified factors, initial encounter: Secondary | ICD-10-CM | POA: Insufficient documentation

## 2013-12-28 DIAGNOSIS — Z79899 Other long term (current) drug therapy: Secondary | ICD-10-CM

## 2013-12-28 DIAGNOSIS — M2170 Unequal limb length (acquired), unspecified site: Secondary | ICD-10-CM | POA: Diagnosis not present

## 2013-12-28 DIAGNOSIS — G894 Chronic pain syndrome: Secondary | ICD-10-CM | POA: Diagnosis not present

## 2013-12-28 MED ORDER — METHOCARBAMOL 500 MG PO TABS: 500.0000 mg | ORAL_TABLET | Freq: Two times a day (BID) | ORAL | Status: DC

## 2013-12-28 MED ORDER — OXYCODONE HCL 5 MG PO TABS: 5.0000 mg | ORAL_TABLET | Freq: Three times a day (TID) | ORAL | Status: DC | PRN

## 2013-12-28 NOTE — Progress Notes (Signed)
Subjective:    Patient ID: [name], female    DOB: 01-31-1956, 58 y.o.   MRN: [name]  HPI: [name] is a 58 year old female who returns for follow up for chronic pain and medication refill. She says her pain is in her right shoulder, right hip and right knee pain. She rates her pain 8. She admits not being as  active due to intensity of pain, she realizes this is a setback compare to last year. She doesn't have an exercise regime due to her inability to stand for long period of times.  She feels stressed as it relates to her brother. He's mentally ill and she used to care for him before her illness set her back. Emotional support was given,and she verbalized understanding. She knows she needs to focus on herself at this time. She hasn't been to outpatient physical therapy, she says she'll go within 2 weeks. She lost the  wheelchair script that was given at last visit. I explained she need to have the physical therapist assess her for the proper wheelchair she agrees and undestands. She's having a financial hardship and knows she won't be able to do many sessions with the therapist. I instructed for her to let the physical therapist know and at least  be evaluated for the wheelchair, she verbalized understanding. She arises from chair with some difficulty, she puts all her weight on the left leg. She walks with antalgic and Trendelenburg gait.   Pain Inventory Average Pain 6 Pain Right Now 8 My pain is sharp, burning, dull and aching  In the last 24 hours, has pain interfered with the following? General activity 0 Relation with others 0 Enjoyment of life 0 What TIME of day is your pain at its worst? daytime Sleep (in general) Poor  Pain is worse with: walking, bending, inactivity and standing Pain improves with: rest and medication Relief from Meds: 6  Mobility use a cane use a walker how many minutes can you walk? 2 ability to climb steps?  yes do you drive?   yes  Function disabled: date disabled .  Neuro/Psych trouble walking  Prior Studies Any changes since last visit?  no  Physicians involved in your care Any changes since last visit?  no   Family History  Problem Relation Age of Onset  . Cancer Mother   . Diabetes Mother   . Cancer Father    History   Social History  . Marital Status: Divorced    Spouse Name: N/A    Number of Children: N/A  . Years of Education: N/A   Social History Main Topics  . Smoking status: Current Every Day Smoker  . Smokeless tobacco: Never Used  . Alcohol Use: No  . Drug Use: No  . Sexual Activity: None   Other Topics Concern  . None   Social History Narrative  . None   Past Surgical History  Procedure Laterality Date  . Abdominal hysterectomy    . Total knee arthroplasty    . Cholecystectomy     Past Medical History  Diagnosis Date  . Osteoporosis   . Depression   . Hypertension   . Hypothyroid   . Anemia   . Obesity    BP 173/71  Pulse 85  Resp 16  Ht 5' 6.5" (1.689 m)  Wt 299 lb (135.626 kg)  BMI 47.54 kg/m2  SpO2 99%  Opioid Risk Score:   Fall Risk Score: Moderate Fall Risk (  6-13 points) (patient edcuated handout declined)   Review of Systems  Musculoskeletal: Positive for back pain and gait problem.  All other systems reviewed and are negative.      Objective:   Physical Exam  Nursing note and vitals reviewed. Constitutional: She is oriented to person, place, and time. She appears well-developed and well-nourished.  Obese  HENT:  Head: Normocephalic.  Neck: Normal range of motion. Neck supple.  Cardiovascular: Normal rate and regular rhythm.   Pulmonary/Chest: Effort normal and breath sounds normal.  Musculoskeletal:  Normal Muscle Bulk: Muscle Testing Reveals: Bilateral Hand Grips 5/5.  Right Shoulder with Tenderness Noted: Supraspinatus region Full ROM to Upper Extremities Right leg with decreased ROM 10 degrees/ Flexion radiates right hip  pain. Left Leg: Decreased ROM 20 degrees/ Flexion pain into left hip. Walks with forward flexion. Unable to flex right knee Left Knee flexion and some extension noted. Spine flexion: 30 degrees  Neurological: She is alert and oriented to person, place, and time.  Skin: Skin is warm and dry.  Psychiatric: She has a normal mood and affect.          Assessment & Plan:  1. Chronic right hip pain due to AVN with joint collapse: Pain is reasonably controlled--continue oxycodone tid, Voltaren gel as well as Robaxin prn. She says the Voltaren gel helps tremendously. Refilled: Oxycodone 5 mg #90--use one pill every 8 hours prn pain.  2. Low back pain: Due to RLE leg length discrepancy. She has her instep in her shoe today.   3. Balance deficits with poor endurance: Outpatient PT has contacted her and she hasn't went to therapy, plans on going within two weeks. Encouraged her to get the wheelchair, she verbalized understanding.  F/U in 1 Month  30 minutes of face to face patient care time was spent during this visit. All questions were encouraged and answered.

## 2014-01-25 ENCOUNTER — Encounter: Payer: Medicare Other | Attending: Physical Medicine and Rehabilitation | Admitting: Registered Nurse

## 2014-01-25 ENCOUNTER — Encounter: Payer: Self-pay | Admitting: Registered Nurse

## 2014-01-25 VITALS — BP 150/68 | HR 95 | Resp 14 | Ht 66.0 in | Wt 312.0 lb

## 2014-01-25 DIAGNOSIS — Z79899 Other long term (current) drug therapy: Secondary | ICD-10-CM

## 2014-01-25 DIAGNOSIS — Z5181 Encounter for therapeutic drug level monitoring: Secondary | ICD-10-CM | POA: Diagnosis not present

## 2014-01-25 DIAGNOSIS — M8700 Idiopathic aseptic necrosis of unspecified bone: Secondary | ICD-10-CM | POA: Diagnosis not present

## 2014-01-25 DIAGNOSIS — M2170 Unequal limb length (acquired), unspecified site: Secondary | ICD-10-CM | POA: Insufficient documentation

## 2014-01-25 DIAGNOSIS — G894 Chronic pain syndrome: Secondary | ICD-10-CM | POA: Diagnosis not present

## 2014-01-25 DIAGNOSIS — X58XXXA Exposure to other specified factors, initial encounter: Secondary | ICD-10-CM | POA: Insufficient documentation

## 2014-01-25 MED ORDER — OXYCODONE HCL 5 MG PO TABS: 5.0000 mg | ORAL_TABLET | Freq: Three times a day (TID) | ORAL | Status: DC | PRN

## 2014-01-25 NOTE — Progress Notes (Signed)
Subjective:    Patient ID: [name], female    DOB: 09-18-1955, 59 y.o.   MRN: [name]  HPI: [name] is a 58 year old female who returns for follow up for chronic pain and medication refill. She says her pain is located in her bilateral shoulders, upper back and bilateral knees right greater than left. She rates her pain 8.  She doesn't follow an exercise regime due to intensity of pain. She states her pain has intensified and their are times when the pain is so sever she wish she could take an extra oxycodone tablet. Facial Grimacing noted with walking, and every position change while sitting. Pain Medications will be increased to 100 tablets. Last month referral was made for a wheelchair evaluation the referral went to the wrong Physical [facility]. Calls have been placed and she is schedule for wheelchair evaluation on June 25th at 08:45. Information will be mailed to her home.  Her family will be visiting her next week and they will be building a side porch, deck and ramp. She is very excited about this.  Pain Inventory Average Pain 8 Pain Right Now 8 My pain is constant, sharp, dull and aching  In the last 24 hours, has pain interfered with the following? General activity 7 Relation with others 7 Enjoyment of life 8 What TIME of day is your pain at its worst? daytime Sleep (in general) Poor  Pain is worse with: walking, bending, inactivity and standing Pain improves with: rest and medication Relief from Meds: 5  Mobility walk with assistance use a cane use a walker ability to climb steps?  yes do you drive?  yes use a wheelchair Do you have any goals in this area?  yes  Function Do you have any goals in this area?  no  Neuro/Psych bladder control problems  Prior Studies Any changes since last visit?  no  Physicians involved in your care Any changes since last visit?  no   Family History  Problem Relation Age of Onset  . Cancer Mother    . Diabetes Mother   . Cancer Father    History   Social History  . Marital Status: Divorced    Spouse Name: N/A    Number of Children: N/A  . Years of Education: N/A   Social History Main Topics  . Smoking status: Current Every Day Smoker  . Smokeless tobacco: Never Used  . Alcohol Use: No  . Drug Use: No  . Sexual Activity: None   Other Topics Concern  . None   Social History Narrative  . None   Past Surgical History  Procedure Laterality Date  . Abdominal hysterectomy    . Total knee arthroplasty    . Cholecystectomy     Past Medical History  Diagnosis Date  . Osteoporosis   . Depression   . Hypertension   . Hypothyroid   . Anemia   . Obesity    BP 150/68  Pulse 95  Resp 14  Ht 5\' 6"  (1.676 m)  Wt 312 lb (141.522 kg)  BMI 50.38 kg/m2  SpO2 96%  Opioid Risk Score:   Fall Risk Score: Moderate Fall Risk (6-13 points) (pt educated on fall risk, brochure given to pt previously)    Review of Systems  Genitourinary:       Bladder control problems  Musculoskeletal: Positive for back pain.  All other systems reviewed and are negative.      Objective:  Physical Exam  Nursing note and vitals reviewed. Constitutional: She is oriented to person, place, and time. She appears well-developed and well-nourished.  Morbid Obese  HENT:  Head: Normocephalic and atraumatic.  Neck: Normal range of motion. Neck supple.  Cardiovascular: Normal rate, regular rhythm and normal heart sounds.   Pulmonary/Chest: Effort normal and breath sounds normal.  Musculoskeletal:  Normal Muscle Bulk: Muscle testing Reveals: Upper Extremities: Full ROM and Muscle Strength 5/5 Bilateral AC-Joint Tenderness Noted Trapezius Muscle and Spine of Scapula Tenderness Thoracic paraspinal tenderness: T- 5- T-8 Lumbar paraspinal tenderness Noted: L-4-L-5 Lower Extremities: Right Leg unable to flex.Muscle Strength 4/5 Left Leg: Full ROM and Muscle Strength 5/5  Circumduction and  antalgic gait. Walks with Forward flexion.  Neurological: She is alert and oriented to person, place, and time.  Skin: Skin is warm and dry.  Psychiatric: She has a normal mood and affect.          Assessment & Plan:  1. Chronic right hip pain due to AVN with joint collapse: Increase in Intensity of Pain medication increased to 100 tablets. Continue oxycodone tid, Voltaren gel as well as Robaxin prn. Refilled: Oxycodone 5 mg #100--use one pill every 8 hours prn pain. May take an extra tablet when pain is sever. 2. Low back pain: Due to RLE leg length discrepancy. She's not wearing her instep in her shoe today she says due to intensity of pain.  She was placed in a wheelchair and escorted to her car today. 3. Balance deficits with poor endurance: Outpatient PT will began on May 28th, 2015. Wheel Chair evaluation will be on June 25th, 2015 at 08:45.  30 minutes of face to face patient care time was spent during this visit. All questions were encouraged and answered.  F/U in 1 month

## 2014-01-26 ENCOUNTER — Telehealth: Payer: Self-pay | Admitting: Registered Nurse

## 2014-01-26 NOTE — Telephone Encounter (Signed)
I spoke with [name] regarding her wheelchair evaluation. She is aware of the appointment on June 25th at 08:45. [facility] [address]. They will mail her a packet.

## 2014-02-01 ENCOUNTER — Ambulatory Visit: Payer: Medicare Other

## 2014-02-23 ENCOUNTER — Encounter: Payer: Self-pay | Admitting: Registered Nurse

## 2014-02-23 ENCOUNTER — Encounter: Payer: Medicare Other | Attending: Physical Medicine and Rehabilitation | Admitting: Registered Nurse

## 2014-02-23 VITALS — BP 124/83 | HR 106 | Resp 14 | Ht 67.0 in | Wt 312.0 lb

## 2014-02-23 DIAGNOSIS — M8700 Idiopathic aseptic necrosis of unspecified bone: Secondary | ICD-10-CM | POA: Diagnosis not present

## 2014-02-23 DIAGNOSIS — M2170 Unequal limb length (acquired), unspecified site: Secondary | ICD-10-CM

## 2014-02-23 DIAGNOSIS — X58XXXA Exposure to other specified factors, initial encounter: Secondary | ICD-10-CM | POA: Insufficient documentation

## 2014-02-23 DIAGNOSIS — Z79899 Other long term (current) drug therapy: Secondary | ICD-10-CM

## 2014-02-23 DIAGNOSIS — G894 Chronic pain syndrome: Secondary | ICD-10-CM | POA: Diagnosis not present

## 2014-02-23 DIAGNOSIS — Z5181 Encounter for therapeutic drug level monitoring: Secondary | ICD-10-CM | POA: Diagnosis not present

## 2014-02-23 MED ORDER — OXYCODONE HCL 5 MG PO TABS: 5.0000 mg | ORAL_TABLET | Freq: Three times a day (TID) | ORAL | Status: DC | PRN

## 2014-02-23 NOTE — Progress Notes (Signed)
Subjective:    Patient ID: [name], female    DOB: Aug 18, 1956, 58 y.o.   MRN: [id number]  HPI: [name] is a 58 year old female who returns for follow up for chronic pain and medication refill. She says her pain is located in her right hip and lower back. She rates her pain a 7. Her current exercise regime is walking for short distances.  Her family cam in town and  Built her a ramp with railings and a deck. She will be having a wheelchair evaluation on June 25th, 2015.  Pain Inventory Average Pain 5 Pain Right Now 7 My pain is sharp, burning, stabbing and aching  In the last 24 hours, has pain interfered with the following? General activity 10 Relation with others 10 Enjoyment of life 10 What TIME of day is your pain at its worst? daytime Sleep (in general) Poor  Pain is worse with: walking, bending and standing Pain improves with: rest and medication Relief from Meds: 5  Mobility walk with assistance use a cane use a walker ability to climb steps?  no do you drive?  yes Do you have any goals in this area?  yes  Function disabled: date disabled na I need assistance with the following:  meal prep, household duties and shopping  Neuro/Psych depression anxiety  Prior Studies Any changes since last visit?  no  Physicians involved in your care Any changes since last visit?  no   Family History  Problem Relation Age of Onset  . Cancer Mother   . Diabetes Mother   . Cancer Father    History   Social History  . Marital Status: Divorced    Spouse Name: N/A    Number of Children: N/A  . Years of Education: N/A   Social History Main Topics  . Smoking status: Current Every Day Smoker  . Smokeless tobacco: Never Used  . Alcohol Use: No  . Drug Use: No  . Sexual Activity: None   Other Topics Concern  . None   Social History Narrative  . None   Past Surgical History  Procedure Laterality Date  . Abdominal hysterectomy    . Total knee  arthroplasty    . Cholecystectomy     Past Medical History  Diagnosis Date  . Osteoporosis   . Depression   . Hypertension   . Hypothyroid   . Anemia   . Obesity    BP 124/83  Pulse 106  Resp 14  Ht 5\' 7"  (1.702 m)  Wt 312 lb (141.522 kg)  BMI 48.85 kg/m2  SpO2 95%  Opioid Risk Score:   Fall Risk Score: Moderate Fall Risk (6-13 points) (pt educated on fall risk, brochure given to pt previously)   Review of Systems  Psychiatric/Behavioral: The patient is nervous/anxious.        Depression  All other systems reviewed and are negative.      Objective:   Physical Exam  Nursing note and vitals reviewed. Constitutional: She is oriented to person, place, and time. She appears well-developed and well-nourished.  HENT:  Head: Normocephalic and atraumatic.  Neck: Normal range of motion. Neck supple.  Cardiovascular: Normal rate and regular rhythm.   Pulmonary/Chest: Effort normal.  Musculoskeletal:  Normal Muscle Bulk and Muscle testing Reveals: Upper Extremities: Full ROM and Muscle Strength 5/5. Lumbar Paraspinal Tenderness: L-3- L-5 Lower Extremities: Right leg with Decreased ROM, unable to flex, extension 25 degrees Left Leg: Flexion Produces pain into  Patella, swelling noted Uses a Straight Cane for support She doesn't have her lift in shoe  Arises from chair with slight difficulty Antalgic Gait  Neurological: She is alert and oriented to person, place, and time.  Skin: Skin is warm and dry.  Psychiatric: She has a normal mood and affect.          Assessment & Plan:  1. Chronic right hip pain due to AVN with joint collapse: Continue oxycodone tid, Voltaren gel and Robaxin prn.  Refilled: Oxycodone 5 mg #100--use one pill every 8 hours prn pain. May take an extra tablet when pain is sever.  2. Low back pain: Due to RLE leg length discrepancy. She's not wearing her instep in her shoe today. 3. Balance deficits with poor endurance:  She refuses to start  Outpatient PT due to conflict with staff.Ask if she would like to go to another facility she says not right now.  Wheel Chair evaluation will be on June 25th, 2015 at 08:45.   30 minutes of face to face patient care time was spent during this visit. All questions were encouraged and answered.   F/U in 1 month

## 2014-02-23 NOTE — Patient Instructions (Signed)
Wheel Chair Evaluation on June 25th at 08:15. At the [facility] [address] [phone] Phone # [phone]

## 2014-03-01 ENCOUNTER — Ambulatory Visit: Payer: Medicare Other | Attending: Registered Nurse | Admitting: Physical Therapy

## 2014-03-01 DIAGNOSIS — R269 Unspecified abnormalities of gait and mobility: Secondary | ICD-10-CM | POA: Insufficient documentation

## 2014-03-01 DIAGNOSIS — G894 Chronic pain syndrome: Secondary | ICD-10-CM | POA: Insufficient documentation

## 2014-03-01 DIAGNOSIS — M21069 Valgus deformity, not elsewhere classified, unspecified knee: Secondary | ICD-10-CM | POA: Diagnosis not present

## 2014-03-01 DIAGNOSIS — M8700 Idiopathic aseptic necrosis of unspecified bone: Secondary | ICD-10-CM | POA: Diagnosis not present

## 2014-03-01 DIAGNOSIS — IMO0001 Reserved for inherently not codable concepts without codable children: Secondary | ICD-10-CM | POA: Insufficient documentation

## 2014-03-19 ENCOUNTER — Ambulatory Visit: Payer: Medicare Other | Attending: Internal Medicine | Admitting: Internal Medicine

## 2014-03-19 ENCOUNTER — Encounter: Payer: Self-pay | Admitting: Internal Medicine

## 2014-03-19 VITALS — BP 111/66 | HR 88 | Temp 98.1°F | Resp 16 | Wt 313.4 lb

## 2014-03-19 DIAGNOSIS — D239 Other benign neoplasm of skin, unspecified: Secondary | ICD-10-CM | POA: Insufficient documentation

## 2014-03-19 DIAGNOSIS — B182 Chronic viral hepatitis C: Secondary | ICD-10-CM | POA: Insufficient documentation

## 2014-03-19 DIAGNOSIS — I10 Essential (primary) hypertension: Secondary | ICD-10-CM | POA: Insufficient documentation

## 2014-03-19 DIAGNOSIS — F17200 Nicotine dependence, unspecified, uncomplicated: Secondary | ICD-10-CM | POA: Insufficient documentation

## 2014-03-19 DIAGNOSIS — D229 Melanocytic nevi, unspecified: Secondary | ICD-10-CM

## 2014-03-19 MED ORDER — AMLODIPINE BESYLATE 5 MG PO TABS: 5.0000 mg | ORAL_TABLET | Freq: Every day | ORAL | Status: DC

## 2014-03-19 MED ORDER — LISINOPRIL 40 MG PO TABS: 40.0000 mg | ORAL_TABLET | Freq: Every day | ORAL | Status: DC

## 2014-03-19 NOTE — Progress Notes (Signed)
Patient here for follow up Would like to see a specialist for her Hep C Needs her blood pressure medications refilled

## 2014-03-19 NOTE — Progress Notes (Signed)
MRN: [id number] Name: [name]  Sex: female Age: 58 y.o. DOB: Feb 11, 1956  Allergies: Penicillins and Demerol  Chief Complaint  Patient presents with  . Follow-up    HPI: Patient is 58 y.o. female who has history of hypertension comes today for followup and requesting refill on the medication, she denies any acute symptoms denies any headache dizziness chest and shortness of breath, she has chronic hip pain following up with physical therapy, she does smoke cigarettes, I have advised patient to quit smoking patient is not ready yet. She reported to have noticed several moles on the skin and questionable increase in size and changing color recently. Patient also was diagnosed with hepatitis C as per patient she has not seen a specialist and is requesting referral.  Past Medical History  Diagnosis Date  . Osteoporosis   . Depression   . Hypertension   . Hypothyroid   . Anemia   . Obesity     Past Surgical History  Procedure Laterality Date  . Abdominal hysterectomy    . Total knee arthroplasty    . Cholecystectomy        Medication List       This list is accurate as of: 03/19/14  4:31 PM.  Always use your most recent med list.               amLODipine 5 MG tablet  Commonly known as:  NORVASC  Take 1 tablet (5 mg total) by mouth daily.     clobetasol cream 0.05 %  Commonly known as:  TEMOVATE     desloratadine 5 MG disintegrating tablet  Commonly known as:  CLARINEX REDITAB  Take 1 tablet (5 mg total) by mouth daily.     diclofenac sodium 1 % Gel  Commonly known as:  VOLTAREN  Apply 2 g topically 4 (four) times daily.     fluticasone 50 MCG/ACT nasal spray  Commonly known as:  FLONASE  Place 2 sprays into both nostrils daily.     guaiFENesin-dextromethorphan 100-10 MG/5ML syrup  Commonly known as:  ROBITUSSIN DM  Take 5 mLs by mouth every 4 (four) hours as needed for cough.     lisinopril 40 MG tablet  Commonly known as:  PRINIVIL,ZESTRIL  Take  1 tablet (40 mg total) by mouth daily.     methocarbamol 500 MG tablet  Commonly known as:  ROBAXIN  Take 1 tablet (500 mg total) by mouth 2 times daily at 12 noon and 4 pm.     oxyCODONE 5 MG immediate release tablet  Commonly known as:  ROXICODONE  Take 1 tablet (5 mg total) by mouth every 8 (eight) hours as needed. May take an extra tablet when pain is sever     sertraline 50 MG tablet  Commonly known as:  ZOLOFT  Take 1.5 tablets (75 mg total) by mouth at bedtime.     traZODone 50 MG tablet  Commonly known as:  DESYREL        Meds ordered this encounter  Medications  . amLODipine (NORVASC) 5 MG tablet    Sig: Take 1 tablet (5 mg total) by mouth daily.    Dispense:  90 tablet    Refill:  3  . lisinopril (PRINIVIL,ZESTRIL) 40 MG tablet    Sig: Take 1 tablet (40 mg total) by mouth daily.    Dispense:  90 tablet    Refill:  3    Immunization History  Administered Date(s) Administered  .  Influenza,inj,Quad PF,36+ Mos 07/31/2013    Family History  Problem Relation Age of Onset  . Cancer Mother   . Diabetes Mother   . Cancer Father     History  Substance Use Topics  . Smoking status: Current Every Day Smoker  . Smokeless tobacco: Never Used  . Alcohol Use: No    Review of Systems   As noted in HPI  Filed Vitals:   03/19/14 1607  BP: 111/66  Pulse: 88  Temp: 98.1 F (36.7 C)  Resp: 16    Physical Exam  Physical Exam  Constitutional: No distress.  Patient is on wheelchair   Eyes: EOM are normal. Pupils are equal, round, and reactive to light.  Cardiovascular: Normal rate and regular rhythm.   Pulmonary/Chest: Breath sounds normal. No respiratory distress. She has no wheezes. She has no rales.  Skin:  Several moles on skin     CBC    Component Value Date/Time   WBC 9.1 12/29/2012 1100   RBC 4.38 12/29/2012 1100   HGB 13.6 12/29/2012 1100   HCT 39.1 12/29/2012 1100   PLT 112* 12/29/2012 1100   MCV 89.3 12/29/2012 1100   LYMPHSABS 4.1*  06/19/2009 2038   MONOABS 0.7 06/19/2009 2038   EOSABS 0.1 06/19/2009 2038   BASOSABS 0.0 06/19/2009 2038    CMP     Component Value Date/Time   NA 136 07/31/2013 1516   K 3.7 07/31/2013 1516   CL 102 07/31/2013 1516   CO2 27 07/31/2013 1516   GLUCOSE 98 07/31/2013 1516   BUN 12 07/31/2013 1516   CREATININE 0.65 07/31/2013 1516   CREATININE 0.51 12/29/2012 1100   CALCIUM 9.1 07/31/2013 1516   PROT 7.9 07/31/2013 1516   ALBUMIN 3.6 07/31/2013 1516   AST 70* 07/31/2013 1516   ALT 46* 07/31/2013 1516   ALKPHOS 90 07/31/2013 1516   BILITOT 0.6 07/31/2013 1516   GFRNONAA >89 07/31/2013 1516   GFRNONAA >90 12/29/2012 1100   GFRAA >89 07/31/2013 1516   GFRAA >90 12/29/2012 1100    Lab Results  Component Value Date/Time   CHOL 153 07/31/2013  3:16 PM    No components found with this basename: hga1c    Lab Results  Component Value Date/Time   AST 70* 07/31/2013  3:16 PM    Assessment and Plan  Essential hypertension, benign - Plan: Blood pressure is well controlled continue with current medications amLODipine (NORVASC) 5 MG tablet, lisinopril (PRINIVIL,ZESTRIL) 40 MG tablet, will check blood chemistry COMPLETE METABOLIC PANEL WITH GFR  Chronic hepatitis C without hepatic coma - Plan: Ambulatory referral to Infectious Disease  Smoking Advised patient to quit smoking.  Numerous moles - Plan: Ambulatory referral to Dermatology  Return in about 3 months (around 06/19/2014) for hypertension.  Lorayne Marek, MD

## 2014-03-19 NOTE — Patient Instructions (Signed)
DASH Eating Plan  DASH stands for "Dietary Approaches to Stop Hypertension." The DASH eating plan is a healthy eating plan that has been shown to reduce high blood pressure (hypertension). Additional health benefits may include reducing the risk of type 2 diabetes mellitus, heart disease, and stroke. The DASH eating plan may also help with weight loss.  WHAT DO I NEED TO KNOW ABOUT THE DASH EATING PLAN?  For the DASH eating plan, you will follow these general guidelines:  · Choose foods with a percent daily value for sodium of less than 5% (as listed on the food label).  · Use salt-free seasonings or herbs instead of table salt or sea salt.  · Check with your health care provider or pharmacist before using salt substitutes.  · Eat lower-sodium products, often labeled as "lower sodium" or "no salt added."  · Eat fresh foods.  · Eat more vegetables, fruits, and low-fat dairy products.  · Choose whole grains. Look for the word "whole" as the first word in the ingredient list.  · Choose fish and skinless chicken or turkey more often than red meat. Limit fish, poultry, and meat to 6 oz (170 g) each day.  · Limit sweets, desserts, sugars, and sugary drinks.  · Choose heart-healthy fats.  · Limit cheese to 1 oz (28 g) per day.  · Eat more home-cooked food and less restaurant, buffet, and fast food.  · Limit fried foods.  · Cook foods using methods other than frying.  · Limit canned vegetables. If you do use them, rinse them well to decrease the sodium.  · When eating at a restaurant, ask that your food be prepared with less salt, or no salt if possible.  WHAT FOODS CAN I EAT?  Seek help from a dietitian for individual calorie needs.  Grains  Whole grain or whole wheat bread. Brown rice. Whole grain or whole wheat pasta. Quinoa, bulgur, and whole grain cereals. Low-sodium cereals. Corn or whole wheat flour tortillas. Whole grain cornbread. Whole grain crackers. Low-sodium crackers.  Vegetables  Fresh or frozen vegetables  (raw, steamed, roasted, or grilled). Low-sodium or reduced-sodium tomato and vegetable juices. Low-sodium or reduced-sodium tomato sauce and paste. Low-sodium or reduced-sodium canned vegetables.   Fruits  All fresh, canned (in natural juice), or frozen fruits.  Meat and Other Protein Products  Ground beef (85% or leaner), grass-fed beef, or beef trimmed of fat. Skinless chicken or turkey. Ground chicken or turkey. Pork trimmed of fat. All fish and seafood. Eggs. Dried beans, peas, or lentils. Unsalted nuts and seeds. Unsalted canned beans.  Dairy  Low-fat dairy products, such as skim or 1% milk, 2% or reduced-fat cheeses, low-fat ricotta or cottage cheese, or plain low-fat yogurt. Low-sodium or reduced-sodium cheeses.  Fats and Oils  Tub margarines without trans fats. Light or reduced-fat mayonnaise and salad dressings (reduced sodium). Avocado. Safflower, olive, or canola oils. Natural peanut or almond butter.  Other  Unsalted popcorn and pretzels.  The items listed above may not be a complete list of recommended foods or beverages. Contact your dietitian for more options.  WHAT FOODS ARE NOT RECOMMENDED?  Grains  White bread. White pasta. White rice. Refined cornbread. Bagels and croissants. Crackers that contain trans fat.  Vegetables  Creamed or fried vegetables. Vegetables in a cheese sauce. Regular canned vegetables. Regular canned tomato sauce and paste. Regular tomato and vegetable juices.  Fruits  Dried fruits. Canned fruit in light or heavy syrup. Fruit juice.  Meat and Other Protein   Products  Fatty cuts of meat. Ribs, chicken wings, bacon, sausage, bologna, salami, chitterlings, fatback, hot dogs, bratwurst, and packaged luncheon meats. Salted nuts and seeds. Canned beans with salt.  Dairy  Whole or 2% milk, cream, half-and-half, and cream cheese. Whole-fat or sweetened yogurt. Full-fat cheeses or blue cheese. Nondairy creamers and whipped toppings. Processed cheese, cheese spreads, or cheese  curds.  Condiments  Onion and garlic salt, seasoned salt, table salt, and sea salt. Canned and packaged gravies. Worcestershire sauce. Tartar sauce. Barbecue sauce. Teriyaki sauce. Soy sauce, including reduced sodium. Steak sauce. Fish sauce. Oyster sauce. Cocktail sauce. Horseradish. Ketchup and mustard. Meat flavorings and tenderizers. Bouillon cubes. Hot sauce. Tabasco sauce. Marinades. Taco seasonings. Relishes.  Fats and Oils  Butter, stick margarine, lard, shortening, ghee, and bacon fat. Coconut, palm kernel, or palm oils. Regular salad dressings.  Other  Pickles and olives. Salted popcorn and pretzels.  The items listed above may not be a complete list of foods and beverages to avoid. Contact your dietitian for more information.  WHERE CAN I FIND MORE INFORMATION?  National Heart, Lung, and [employer] Institute: www.nhlbi.nih.gov/health/health-topics/topics/dash/  Document Released: 08/13/2011 Document Revised: 08/29/2013 Document Reviewed: 06/28/2013  Exit[employer]® Patient Information ©2015 [employer]. This information is not intended to replace advice given to you by your health care provider. Make sure you discuss any questions you have with your health care provider.

## 2014-03-20 ENCOUNTER — Telehealth: Payer: Self-pay

## 2014-03-20 LAB — COMPLETE METABOLIC PANEL WITH GFR
ALBUMIN: 3.2 g/dL — AB (ref 3.5–5.2)
ALT: 40 U/L — AB (ref 0–35)
AST: 66 U/L — ABNORMAL HIGH (ref 0–37)
Alkaline Phosphatase: 91 U/L (ref 39–117)
BUN: 9 mg/dL (ref 6–23)
CALCIUM: 8.3 mg/dL — AB (ref 8.4–10.5)
CHLORIDE: 105 meq/L (ref 96–112)
CO2: 22 meq/L (ref 19–32)
Creat: 0.56 mg/dL (ref 0.50–1.10)
GFR, Est African American: >89 mL/min
Glucose, Bld: 103 mg/dL — ABNORMAL HIGH (ref 70–99)
Potassium: 4.2 mEq/L (ref 3.5–5.3)
SODIUM: 137 meq/L (ref 135–145)
TOTAL PROTEIN: 7.5 g/dL (ref 6.0–8.3)
Total Bilirubin: 0.5 mg/dL (ref 0.2–1.2)

## 2014-03-20 NOTE — Telephone Encounter (Signed)
Message copied by Dorothe Pea on Tue Mar 20, 2014  2:40 PM ------      Message from: Lorayne Marek      Created: Tue Mar 20, 2014 12:48 PM       Blood work reviewed noticed impaired fasting glucose, call and advise patient for low carbohydrate diet.      Patient has abnormal LFTs, she has already been referred to ID for hep C. ------

## 2014-03-20 NOTE — Telephone Encounter (Signed)
Patient is aware of her lab results 

## 2014-03-26 ENCOUNTER — Encounter: Payer: Medicare Other | Attending: Physical Medicine and Rehabilitation | Admitting: Registered Nurse

## 2014-03-26 ENCOUNTER — Encounter: Payer: Self-pay | Admitting: Registered Nurse

## 2014-03-26 VITALS — BP 124/53 | HR 66 | Resp 14 | Wt 319.0 lb

## 2014-03-26 DIAGNOSIS — Z5181 Encounter for therapeutic drug level monitoring: Secondary | ICD-10-CM | POA: Diagnosis not present

## 2014-03-26 DIAGNOSIS — G894 Chronic pain syndrome: Secondary | ICD-10-CM | POA: Insufficient documentation

## 2014-03-26 DIAGNOSIS — X58XXXA Exposure to other specified factors, initial encounter: Secondary | ICD-10-CM | POA: Insufficient documentation

## 2014-03-26 DIAGNOSIS — M8700 Idiopathic aseptic necrosis of unspecified bone: Secondary | ICD-10-CM

## 2014-03-26 DIAGNOSIS — Z79899 Other long term (current) drug therapy: Secondary | ICD-10-CM

## 2014-03-26 DIAGNOSIS — M2170 Unequal limb length (acquired), unspecified site: Secondary | ICD-10-CM | POA: Diagnosis not present

## 2014-03-26 MED ORDER — METHOCARBAMOL 500 MG PO TABS: 500.0000 mg | ORAL_TABLET | Freq: Two times a day (BID) | ORAL | Status: DC

## 2014-03-26 MED ORDER — OXYCODONE HCL 5 MG PO TABS: 5.0000 mg | ORAL_TABLET | Freq: Three times a day (TID) | ORAL | Status: DC | PRN

## 2014-03-26 NOTE — Progress Notes (Signed)
Subjective:    Patient ID: [name], female    DOB: 10-Nov-1955, 58 y.o.   MRN: [name]  HPI: [name] is a 58 year old female who returns for follow up for chronic pain and medication refill. She says her pain is located in her right shoulder, lower back, right hip, bilateral knees , right greater than left. She rates her pain 6. Her current exercise regime is performing stretching exercises. She says she  Has had difficulty with being consistent due to the pain. Demonstrated chair exercises and she verbalized understanding and will try them. She says she will be fllowing up with infectious disease as it relates to her Hepatitis C. Also has an appointment with [facility] on August 10th,2015 regarding mole changes.   Pain Inventory Average Pain 7 Pain Right Now 6 My pain is sharp, dull, tingling and aching  In the last 24 hours, has pain interfered with the following? General activity 8 Relation with others 8 Enjoyment of life 8 What TIME of day is your pain at its worst? morning Sleep (in general) Fair  Pain is worse with: walking, bending, sitting, inactivity and standing Pain improves with: rest and medication Relief from Meds: no answer  Mobility use a cane use a walker how many minutes can you walk? 2 ability to climb steps?  no do you drive?  yes  Function disabled: date disabled .  Neuro/Psych trouble walking  Prior Studies Any changes since last visit?  no  Physicians involved in your care Any changes since last visit?  no   Family History  Problem Relation Age of Onset  . Cancer Mother   . Diabetes Mother   . Cancer Father    History   Social History  . Marital Status: Divorced    Spouse Name: N/A    Number of Children: N/A  . Years of Education: N/A   Social History Main Topics  . Smoking status: Current Every Day Smoker  . Smokeless tobacco: Never Used  . Alcohol Use: No  . Drug Use: No  . Sexual Activity: None    Other Topics Concern  . None   Social History Narrative  . None   Past Surgical History  Procedure Laterality Date  . Abdominal hysterectomy    . Total knee arthroplasty    . Cholecystectomy     Past Medical History  Diagnosis Date  . Osteoporosis   . Depression   . Hypertension   . Hypothyroid   . Anemia   . Obesity    BP 124/53  Pulse 66  Resp 14  Wt 319 lb (144.697 kg)  SpO2 95%  Opioid Risk Score:   Fall Risk Score: Moderate Fall Risk (6-13 points) (previously educatd and given handout)  Review of Systems  Musculoskeletal: Positive for gait problem.  All other systems reviewed and are negative.      Objective:   Physical Exam  Constitutional: She is oriented to person, place, and time. She appears well-developed and well-nourished.  Obesity  HENT:  Head: Normocephalic and atraumatic.  Neck: Normal range of motion. Neck supple.  Cardiovascular: Normal rate and regular rhythm.   Pulmonary/Chest: Effort normal and breath sounds normal.  Musculoskeletal:  Normal Muscle Bulk and Muscle Testing Reveals: Upper Extremities: Full ROM and Muscle strength 5/5 Right Spine of Scapula Tenderness Lumbar Paraspinal Tenderness Lower Extremities: Right Leg Flexion Produces pain laterally into her leg. Left Leg Flexion Produces pain into Patella Arrived in Wheelchair  Neurological: She is alert and oriented to person, place, and time.  Skin: Skin is warm and dry.  Psychiatric: She has a normal mood and affect.          Assessment & Plan:  1. Chronic right hip pain due to AVN with joint collapse: Continue oxycodone tid, Voltaren gel and Robaxin prn.  Refilled: Oxycodone 5 mg #100--use one pill every 8 hours prn pain. May take an extra tablet when pain is sever.  2. Low back pain: Due to RLE leg length discrepancy. She's not wearing her instep in her shoe today.  3. Balance deficits with poor endurance:  She had her wheel Chair evaluation on June 25th, 2015, has  an appointment on July 28th with [facility] for wheelchair in the office.  30 minutes of face to face patient care time was spent during this visit. All questions were encouraged and answered.   F/U in 1 month

## 2014-03-26 NOTE — Patient Instructions (Signed)
May Take Robaxin three times a day. Call with any questions

## 2014-03-27 ENCOUNTER — Other Ambulatory Visit: Payer: Medicare Other

## 2014-03-27 DIAGNOSIS — B182 Chronic viral hepatitis C: Secondary | ICD-10-CM | POA: Diagnosis not present

## 2014-03-27 LAB — PROTIME-INR
INR: 1.17 (ref <1.50)
PROTHROMBIN TIME: 14.9 s (ref 11.6–15.2)

## 2014-03-28 LAB — CBC WITH DIFFERENTIAL/PLATELET
BASOS PCT: 0 % (ref 0–1)
Basophils Absolute: 0 10*3/uL (ref 0.0–0.1)
Eosinophils Absolute: 0.1 10*3/uL (ref 0.0–0.7)
Eosinophils Relative: 2 % (ref 0–5)
HCT: 33.9 % — ABNORMAL LOW (ref 36.0–46.0)
Hemoglobin: 11.5 g/dL — ABNORMAL LOW (ref 12.0–15.0)
Lymphocytes Relative: 51 % — ABNORMAL HIGH (ref 12–46)
Lymphs Abs: 3.4 10*3/uL (ref 0.7–4.0)
MCH: 30.9 pg (ref 26.0–34.0)
MCHC: 33.9 g/dL (ref 30.0–36.0)
MCV: 91.1 fL (ref 78.0–100.0)
MONOS PCT: 7 % (ref 3–12)
Monocytes Absolute: 0.5 10*3/uL (ref 0.1–1.0)
NEUTROS ABS: 2.6 10*3/uL (ref 1.7–7.7)
NEUTROS PCT: 40 % — AB (ref 43–77)
PLATELETS: 99 10*3/uL — AB (ref 150–400)
RBC: 3.72 MIL/uL — AB (ref 3.87–5.11)
RDW: 14.9 % (ref 11.5–15.5)
WBC: 6.6 10*3/uL (ref 4.0–10.5)

## 2014-03-28 LAB — ANA: ANA: NEGATIVE

## 2014-03-28 LAB — HEPATITIS A ANTIBODY, TOTAL: Hep A Total Ab: REACTIVE — AB

## 2014-03-28 LAB — HEPATITIS B CORE ANTIBODY, TOTAL: Hep B Core Total Ab: NONREACTIVE

## 2014-03-28 LAB — HEPATITIS B SURFACE ANTIBODY,QUALITATIVE: Hep B S Ab: NEGATIVE

## 2014-03-28 LAB — HEPATITIS B SURFACE ANTIGEN: Hepatitis B Surface Ag: NEGATIVE

## 2014-03-28 LAB — IRON: Iron: 133 ug/dL (ref 42–145)

## 2014-03-28 LAB — HIV ANTIBODY (ROUTINE TESTING W REFLEX): HIV: NONREACTIVE

## 2014-03-31 LAB — HEPATITIS C RNA QUANTITATIVE
HCV QUANT LOG: 5.28 {Log} — AB (ref <1.18)
HCV Quantitative: 191546 IU/mL — ABNORMAL HIGH (ref <15)

## 2014-04-02 LAB — HEPATITIS C GENOTYPE

## 2014-04-03 ENCOUNTER — Encounter (HOSPITAL_BASED_OUTPATIENT_CLINIC_OR_DEPARTMENT_OTHER): Payer: Medicare Other | Admitting: Registered Nurse

## 2014-04-03 ENCOUNTER — Encounter: Payer: Self-pay | Admitting: Registered Nurse

## 2014-04-03 VITALS — BP 129/65 | HR 77 | Resp 14 | Wt 319.0 lb

## 2014-04-03 DIAGNOSIS — M8700 Idiopathic aseptic necrosis of unspecified bone: Secondary | ICD-10-CM | POA: Diagnosis not present

## 2014-04-03 DIAGNOSIS — G894 Chronic pain syndrome: Secondary | ICD-10-CM

## 2014-04-03 DIAGNOSIS — M2170 Unequal limb length (acquired), unspecified site: Secondary | ICD-10-CM

## 2014-04-03 NOTE — Progress Notes (Signed)
HPI: [name] is here for a Occupational hygienist. I have reviewed the Physical Therapist Wheel Chair Evaluation Form and Concur with their assessment and evaluation. The Power Wheelchair will be a great asset for [name]. This will help her with mobility in her home, also by improving her independence with activities of daily living. She doesn't have the ability, strength or endurance to use the manual wheelchair due to weakness and decrease range of motion. She has leg length discrepancy of approximately 1 1/2 inches shorter than right. She is unable to bend the right knee to assist with positioning with transfers.  Pain Level# 7 She arrived from the lobby in a wheelchair, she is not  able to walk long distances, she uses a cane for support.  HEENT: Normocephalic, atraumatic Neck: Normal Range of Motion, Cervical Paraspinal Tenderness C-3- C-5 Cardiovascular: [id number] Pulmonary: Decreased BS in Bases Musculoskeletal: Normal Muscle Bulk and Muscle testing Reveals: Upper Extremities: With Decreased ROM and Muscle Strength on the Right 4/5 and Left 5/5 Right Spine of Scapula Tenderness Lumbar Paraspinal Tenderness Lower Extremities: Right Leg: She is unable to Flex Right Patella, Decreased ROM and Strength 3/5  Left Leg: Decreased ROM and Muscle Strength 5/5. Neurological: She is Alert and Oriented x3 Skin: Warm and Dry Psychiatric: She has Normal Mood and Affect  Assessment and Plan: 1. Chronic right hip pain due to AVN with joint collapse: Continue oxycodone tid, Voltaren gel and Robaxin prn.  2. Low back pain: Due to RLE leg length discrepancy.Continue to Monitor 3. Balance deficits with poor endurance: Awaiting on Approval for Motorized Wheelchair  30 minutes of face to face patient care time was spent during this visit. All questions were encouraged and answered.   F/U in 1 month

## 2014-04-30 ENCOUNTER — Encounter: Payer: Self-pay | Admitting: Physical Medicine & Rehabilitation

## 2014-04-30 ENCOUNTER — Ambulatory Visit (HOSPITAL_BASED_OUTPATIENT_CLINIC_OR_DEPARTMENT_OTHER): Payer: Medicare Other | Admitting: Physical Medicine & Rehabilitation

## 2014-04-30 ENCOUNTER — Encounter: Payer: Medicare Other | Attending: Physical Medicine and Rehabilitation

## 2014-04-30 ENCOUNTER — Ambulatory Visit: Payer: Medicare Other | Admitting: Physical Medicine & Rehabilitation

## 2014-04-30 VITALS — BP 118/61 | HR 82 | Resp 14 | Ht 66.0 in | Wt 311.0 lb

## 2014-04-30 DIAGNOSIS — M24559 Contracture, unspecified hip: Secondary | ICD-10-CM

## 2014-04-30 DIAGNOSIS — G894 Chronic pain syndrome: Secondary | ICD-10-CM | POA: Insufficient documentation

## 2014-04-30 DIAGNOSIS — M8700 Idiopathic aseptic necrosis of unspecified bone: Secondary | ICD-10-CM

## 2014-04-30 DIAGNOSIS — M24551 Contracture, right hip: Secondary | ICD-10-CM

## 2014-04-30 DIAGNOSIS — M24569 Contracture, unspecified knee: Secondary | ICD-10-CM

## 2014-04-30 DIAGNOSIS — M24561 Contracture, right knee: Secondary | ICD-10-CM

## 2014-04-30 DIAGNOSIS — X58XXXA Exposure to other specified factors, initial encounter: Secondary | ICD-10-CM | POA: Insufficient documentation

## 2014-04-30 MED ORDER — DICLOFENAC SODIUM 1 % TD GEL: 2.0000 g | Freq: Four times a day (QID) | TRANSDERMAL | Status: DC

## 2014-04-30 MED ORDER — OXYCODONE HCL 5 MG PO TABS: 5.0000 mg | ORAL_TABLET | Freq: Three times a day (TID) | ORAL | Status: DC | PRN

## 2014-04-30 MED ORDER — METHOCARBAMOL 500 MG PO TABS: 500.0000 mg | ORAL_TABLET | Freq: Two times a day (BID) | ORAL | Status: DC

## 2014-04-30 NOTE — Patient Instructions (Signed)
Agree with WC for longer distances

## 2014-04-30 NOTE — Progress Notes (Signed)
Subjective:    Patient ID: [name], female    DOB: Aug 27, 1956, 58 y.o.   MRN: [id number]  HPI  CC:  Left knee pain increasing as well as RIght groin pain  Right groin pain comes and goes, feels like being pinched Worse with sitting Evaluated previously R hip xray from 2011, complete flattening of R femoral head  History of Right knee replacement  Pain Inventory Average Pain 7 Pain Right Now 8 My pain is sharp, stabbing and aching  In the last 24 hours, has pain interfered with the following? General activity 9 Relation with others 8 Enjoyment of life 9 What TIME of day is your pain at its worst? morning Sleep (in general) Poor  Pain is worse with: walking, bending, inactivity and standing Pain improves with: rest and medication Relief from Meds: 8  Mobility walk with assistance use a cane use a walker how many minutes can you walk? 1-2 ability to climb steps?  no do you drive?  yes use a wheelchair needs help with transfers Do you have any goals in this area?  yes  Function disabled: date disabled na  Neuro/Psych tremor depression anxiety  Prior Studies Any changes since last visit?  yes lab work  Physicians involved in your care Any changes since last visit?  no   Family History  Problem Relation Age of Onset  . Cancer Mother   . Diabetes Mother   . Cancer Father    History   Social History  . Marital Status: Divorced    Spouse Name: N/A    Number of Children: N/A  . Years of Education: N/A   Social History Main Topics  . Smoking status: Current Every Day Smoker  . Smokeless tobacco: Never Used  . Alcohol Use: No  . Drug Use: No  . Sexual Activity: None   Other Topics Concern  . None   Social History Narrative  . None   Past Surgical History  Procedure Laterality Date  . Abdominal hysterectomy    . Total knee arthroplasty    . Cholecystectomy     Past Medical History  Diagnosis Date  . Osteoporosis   . Depression     . Hypertension   . Hypothyroid   . Anemia   . Obesity    BP 118/61  Pulse 82  Resp 14  Ht 5\' 6"  (1.676 m)  Wt 311 lb (141.069 kg)  BMI 50.22 kg/m2  SpO2 98%  Opioid Risk Score:   Fall Risk Score: Moderate Fall Risk (6-13 points) (pt educated declined handout)    Review of Systems  Constitutional: Positive for diaphoresis and unexpected weight change.  Respiratory: Positive for wheezing.   Cardiovascular: Positive for leg swelling.  Gastrointestinal: Positive for nausea.  Musculoskeletal: Positive for back pain.  Skin: Positive for rash.  Neurological: Positive for tremors.  Psychiatric/Behavioral:       Depression  All other systems reviewed and are negative.      Objective:   Physical Exam  Nursing note reviewed. Constitutional: She is oriented to person, place, and time. She appears well-developed.  Morbid obesity  Eyes: Conjunctivae and EOM are normal. Pupils are equal, round, and reactive to light.  Cardiovascular: S1 normal and S2 normal.  Exam reveals no gallop.   No murmur heard. Pulmonary/Chest: Breath sounds normal. Not tachypneic. No respiratory distress.  Neurological: She is alert and oriented to person, place, and time.  5/5 strength bilateral deltoid, bicep, tricep, grip  Hip flexion  right side limited by range of motion knee flexion right side limited by range of motion 2 minus hip flexion knee extension on the right side Ankle dorsiflexion 4/5 on the right side  Left hip flexor 3+ left knee extensor for left ankle dorsiflexor 5  Reduced hip internal and external rotation on the left side approximately 50% Hip flexion is approximately 50% knee flexion goes to 110 extension to fall  Sensory intact except around R knee   Psychiatric: She has a normal mood and affect.      Assessment & Plan:  1.  AVN right hip with total collapse, not pursuing surgery secondary to bad experience with prior surgery on RIght knee  2,  RIght knee TKR with  contracture  Patient also is morbid obesity at think she did have a difficult time propelling manual wheelchair. Agree with power chair Patient motivated to use it. No cognitive or physical limitations and using a power chair or scooter

## 2014-05-08 ENCOUNTER — Ambulatory Visit (INDEPENDENT_AMBULATORY_CARE_PROVIDER_SITE_OTHER): Payer: Medicare Other | Admitting: Internal Medicine

## 2014-05-08 ENCOUNTER — Encounter: Payer: Self-pay | Admitting: Internal Medicine

## 2014-05-08 VITALS — BP 118/76 | HR 76 | Temp 97.9°F | Ht 66.0 in | Wt 315.0 lb

## 2014-05-08 DIAGNOSIS — Z23 Encounter for immunization: Secondary | ICD-10-CM | POA: Diagnosis not present

## 2014-05-08 DIAGNOSIS — B182 Chronic viral hepatitis C: Secondary | ICD-10-CM

## 2014-05-08 NOTE — Addendum Note (Signed)
Addended by: Myrtis Hopping A on: 05/08/2014 09:47 AM   Modules accepted: Orders

## 2014-05-08 NOTE — Patient Instructions (Signed)
Date: 05/08/14  Dear Mrs Hegstad, As discussed in the [facility], your hepatitis C therapy will include the following medications:          Harvoni 90mg /400mg  tablet:           Take 1 tablet by mouth once daily   Please note that ALL MEDICATIONS WILL START ON THE SAME DATE for a total of _ weeks. ---------------------------------------------------------------- Your HCV Treatment Start Date: TBA   Your HCV genotype:  1a    Liver Fibrosis: TBA    ---------------------------------------------------------------- YOUR PHARMACY CONTACT:   [facility] [place] Phone: [phone] Hours: Monday to Friday 7:30 am to 6:00 pm   Please always contact your pharmacy at least 3-4 business days before you run out of medications to ensure your next month's medication is ready or 1 week prior to running out if you receive it by mail.  Remember, each prescription is for 28 days. ---------------------------------------------------------------- GENERAL NOTES REGARDING YOUR HEPATITIS C MEDICATIONS:   SOFOSBUVIR/LEDIPASVIR (HARVONI): - Harvoni tablet is taken daily with OR without food. - The tablets are orange. - The tablets should be stored at room temperature.  - Acid reducing agents such as H2 blockers (ie. Pepcid (famotidine), Zantac (ranitidine), Tagamet (cimetidine), Axid (nizatidine) and proton pump inhibitors (ie. Prilosec (omeprazole), Protonix (pantoprazole), Nexium (esomeprazole), or Aciphex (rabeprazole)) can decrease effectiveness of Harvoni. Do not take until you have discussed with a health care provider.    -Antacids that contain magnesium and/or aluminum hydroxide (ie. Milk of Magensia, Rolaids, Gaviscon, Maalox, Mylanta, an dArthritis Pain Formula)can reduce absorption of Harvoni, so take them at least 4 hours before or after Harvoni.  -Calcium carbonate (calcium supplements or antacids such as Tums, Caltrate,  Os-Cal)needs to be taken at least 4 hours hours before or after Harvoni.  -St. John's wort or any products that contain St. John's wort like some herbal supplements  Please inform the office prior to starting any of these medications.  - The common side effects with Harvoni:      1. Fatigue      2. Headache      3. Nausea      4. Diarrhea      5. Insomnia   Support Path is a suite of resources designed to help patients start with HARVONI and move toward treatment completion [place] helps patients access therapy and get off to an efficient start  Benefits investigation and prior authorization support Co-pay and other financial assistance A specialty pharmacy finder CO-PAY COUPON The [place] co-pay coupon may help eligible patients lower their out-of-pocket costs. With a co-pay coupon, most eligible patients may pay no more than $5 per co-pay (restrictions apply) www.harvoni.com call [phone] Not valid for patients enrolled in government healthcare prescription drug programs, such as Medicare Part D and Medicaid. Patients in the coverage gap known as the "donut hole" also are not eligible The HARVONI co-pay coupon program will cover the out-of-pocket costs for HARVONI prescriptions up to a maximum of 25% of the catalog price of a 12-week regimen of HARVONI  Please note that this only lists the most common side effects and is NOT a comprehensive list of the potential side effects of these medications. For more information, please review the drug information sheets that come with your medication package from the pharmacy.  ---------------------------------------------------------------- GENERAL HELPFUL HINTS ON HCV THERAPY: 1. No alcohol. 2. Protect against sun-sensitivity/sunburns (wear sunglasses, hat, long sleeves,  pants and sunscreen). 3. Stay well-hydrated/well-moisturized. 4. Notify the [facility] of any changes in your other over-the-counter/herbal or  prescription medications. 5. If you miss a dose of your medication, take the missed dose as soon as you remember. Return to your regular time/dose schedule the next day.  6.  Do not stop taking your medications without first talking with your healthcare provider. 7.  You may take Tylenol (acetaminophen), as long as the dose is less than 2000 mg (OR no more than 4 tablets of the Tylenol Extra Strengths 500mg  tablet) in 24 hours. 8.  You will need to obtain routine labs and/or office visits at [facility] at weeks 2, 4, 8,  and 12 as well as 12 and 24 weeks after completion of treatment.  If you are not compliant with labs and office visits, we may discontinue HCV therapy.  Scharlene Gloss, [place] for [place] [facility], [place]  [phone]

## 2014-05-08 NOTE — Progress Notes (Signed)
+[name] is a 58 y.o. female who presents for initial evaluation and management of a positive Hepatitis C antibody test.  Patient tested positive 4 years ago after CVA. Test was performed as part of an evaluation of screening. Hepatitis C risk factors present are: history of blood transfusion (details: multiple blood transfusions prior to 1990). Patient denies accidental needle stick, acupuncture, history of clotting factor transfusion, intranasal drug use, IV drug abuse, multiple sexual partners, renal dialysis, sexual contact with person with liver disease, tattoos. Patient has had other studies performed. Results: hepatitis C RNA by PCR, result: positive. Patient has not had prior treatment for Hepatitis C. Patient does not have a past history of liver disease. Patient does not have a family history of liver disease.   HPI: Was previously evaluated for hep C but has never had biopsy or considered treatment.   Patient does have documented immunity to Hepatitis A. Patient does not have documented immunity to Hepatitis B.  Got vaccine but Ab do not show immunity.    Review of Systems A comprehensive review of systems was negative.   Past Medical History  Diagnosis Date  . Osteoporosis   . Depression   . Hypertension   . Hypothyroid   . Anemia   . Obesity     History  Substance Use Topics  . Smoking status: Current Every Day Smoker -- 0.50 packs/day    Types: Cigarettes  . Smokeless tobacco: Never Used  . Alcohol Use: No    Family History  Problem Relation Age of Onset  . Cancer Mother   . Diabetes Mother   . Cancer Father       Objective:   Filed Vitals:   05/08/14 0906  BP: 118/76  Pulse: 76  Temp: 97.9 F (36.6 C)   in no apparent distress and well developed and well nourished HEENT: anicteric Cor RRR clear Bowel sounds are normal, liver is not enlarged, spleen is not enlarged peripheral pulses normal, no pedal edema, no clubbing or cyanosis negative for -  jaundice, spider hemangioma, telangiectasia, palmar erythema, ecchymosis and atrophy  Laboratory Genotype:  Lab Results  Component Value Date   HCVGENOTYPE 1a 03/27/2014   HCV viral load:  Lab Results  Component Value Date   HCVQUANT [id number]* 03/27/2014   Lab Results  Component Value Date   WBC 6.6 03/27/2014   HGB 11.5* 03/27/2014   HCT 33.9* 03/27/2014   MCV 91.1 03/27/2014   PLT 99* 03/27/2014    Lab Results  Component Value Date   CREATININE 0.56 03/19/2014   BUN 9 03/19/2014   NA 137 03/19/2014   K 4.2 03/19/2014   CL 105 03/19/2014   CO2 22 03/19/2014    Lab Results  Component Value Date   ALT 40* 03/19/2014   AST 66* 03/19/2014   ALKPHOS 91 03/19/2014   BILITOT 0.5 03/19/2014   INR 1.17 03/27/2014      Assessment: Hepatitis C genotype 1  Plan: 1) Patient counseled extensively on limiting acetaminophen to no more than 2 grams daily, avoidance of alcohol. 2) Transmission discussed with patient including sexual transmission, sharing razors and toothbrush.   3) Will need referral to gastroenterology: No. If F4.   4) Will need referral for substance abuse counseling: No. 5) Will prescribe Harvoni for 12 weeks once work up complete; will call her with results and schedule if/when prescribed.   6) Hepatitis A vaccine No. 7) Hepatitis B vaccine Yes.   8) Pneumovax vaccine No. unless  F4.

## 2014-05-09 DIAGNOSIS — L821 Other seborrheic keratosis: Secondary | ICD-10-CM | POA: Diagnosis not present

## 2014-05-09 DIAGNOSIS — D239 Other benign neoplasm of skin, unspecified: Secondary | ICD-10-CM | POA: Diagnosis not present

## 2014-05-09 DIAGNOSIS — D485 Neoplasm of uncertain behavior of skin: Secondary | ICD-10-CM | POA: Diagnosis not present

## 2014-05-10 ENCOUNTER — Other Ambulatory Visit: Payer: Self-pay | Admitting: Internal Medicine

## 2014-05-10 ENCOUNTER — Telehealth: Payer: Self-pay | Admitting: *Deleted

## 2014-05-10 ENCOUNTER — Telehealth: Payer: Self-pay | Admitting: Licensed Clinical Social Worker

## 2014-05-10 ENCOUNTER — Ambulatory Visit (HOSPITAL_COMMUNITY)
Admission: RE | Admit: 2014-05-10 | Discharge: 2014-05-10 | Disposition: A | Discharge status: Home / Self Care | Payer: Medicare Other | Source: Ambulatory Visit | Attending: Internal Medicine | Admitting: Internal Medicine

## 2014-05-10 DIAGNOSIS — B182 Chronic viral hepatitis C: Secondary | ICD-10-CM

## 2014-05-10 DIAGNOSIS — R932 Abnormal findings on diagnostic imaging of liver and biliary tract: Secondary | ICD-10-CM | POA: Insufficient documentation

## 2014-05-10 DIAGNOSIS — K759 Inflammatory liver disease, unspecified: Secondary | ICD-10-CM | POA: Diagnosis not present

## 2014-05-10 DIAGNOSIS — Z9889 Other specified postprocedural states: Secondary | ICD-10-CM | POA: Diagnosis not present

## 2014-05-10 MED ORDER — LEDIPASVIR-SOFOSBUVIR 90-400 MG PO TABS: 1.0000 | ORAL_TABLET | Freq: Every day | ORAL | Status: DC

## 2014-05-10 NOTE — Telephone Encounter (Signed)
Dr. Linus Salmons will discuss further at upcoming appt on 06/06/14 [name]

## 2014-05-10 NOTE — Addendum Note (Signed)
Addended by: Jarrett Ables D on: 05/10/2014 11:13 AM   Modules accepted: Orders

## 2014-05-10 NOTE — Telephone Encounter (Signed)
Apolonio Schneiders from [facility] called to verify treatment and fibro score, once information was given Apolonio Schneiders gave me an approval and case number# [id number]. Effective 09/07/2013 through 08/02/2014

## 2014-05-10 NOTE — Telephone Encounter (Signed)
Message copied by Georgena Spurling on Thu May 10, 2014  3:30 PM ------      Message from: Thayer Headings      Created: Thu May 10, 2014 10:11 AM       Here elastography of liver showed advanced disease of her liver and I would like her to see gastroenterology - I have referred her so she should here from them.  Her Harvoni has been prescribed and will just wait to here if it is approved.  thanks ------

## 2014-05-16 ENCOUNTER — Other Ambulatory Visit: Payer: Self-pay | Admitting: Internal Medicine

## 2014-05-16 DIAGNOSIS — K7460 Unspecified cirrhosis of liver: Secondary | ICD-10-CM

## 2014-05-23 ENCOUNTER — Telehealth: Payer: Self-pay

## 2014-05-23 ENCOUNTER — Encounter: Payer: Self-pay | Admitting: Gastroenterology

## 2014-05-23 NOTE — Telephone Encounter (Signed)
Patient is calling regarding phone call from nurse stating she will need to see gastroenterologist and no one has bothered to call to say why.  She is upset that a message was left with no other information.  What does this mean?  Patient was advised xray showed advanced liver disease and Dr Linus Salmons would like a liver specialist to take a look and evaluate this problem.   She should continue the Harvoni unless a physician advises her to stop.  Laverle Patter, RN

## 2014-05-25 ENCOUNTER — Telehealth: Payer: Self-pay | Admitting: *Deleted

## 2014-05-25 NOTE — Telephone Encounter (Signed)
Patient called c/o cough since last office visit on 05/08/14 and wanted to know what she could take with the [place]. Spoke to Dr. Baxter Flattery and advised patient she can try Robitussin DM.

## 2014-05-28 ENCOUNTER — Encounter: Payer: Self-pay | Admitting: Registered Nurse

## 2014-05-28 ENCOUNTER — Encounter: Payer: Medicare Other | Attending: Physical Medicine and Rehabilitation | Admitting: Registered Nurse

## 2014-05-28 VITALS — BP 116/54 | HR 80 | Resp 20 | Wt 315.0 lb

## 2014-05-28 DIAGNOSIS — M8700 Idiopathic aseptic necrosis of unspecified bone: Secondary | ICD-10-CM

## 2014-05-28 DIAGNOSIS — Z5181 Encounter for therapeutic drug level monitoring: Secondary | ICD-10-CM

## 2014-05-28 DIAGNOSIS — Z79899 Other long term (current) drug therapy: Secondary | ICD-10-CM | POA: Diagnosis not present

## 2014-05-28 DIAGNOSIS — G894 Chronic pain syndrome: Secondary | ICD-10-CM

## 2014-05-28 DIAGNOSIS — M24551 Contracture, right hip: Secondary | ICD-10-CM

## 2014-05-28 DIAGNOSIS — M24559 Contracture, unspecified hip: Secondary | ICD-10-CM

## 2014-05-28 DIAGNOSIS — M2170 Unequal limb length (acquired), unspecified site: Secondary | ICD-10-CM

## 2014-05-28 MED ORDER — OXYCODONE HCL 5 MG PO TABS: 5.0000 mg | ORAL_TABLET | Freq: Three times a day (TID) | ORAL | Status: DC | PRN

## 2014-05-28 NOTE — Progress Notes (Signed)
Subjective:    Patient ID: [name], female    DOB: [birth year], 58 y.o.   MRN: [name]  HPI: [name] is a 58 year old female who returns for follow up for chronic pain and medication refill. She says her pain is located in her right shoulder, right groin, right hip and left knee knee. She rates her pain 7 Her current exercise regime is walking and gardening She is followin  with infectious disease as it relates to her Hepatitis C. She has been placed on Harvoni for 12 weeks.  She will be seeing a Copywriter, advertising in November 2015.  Pain Inventory Average Pain 7 Pain Right Now 7 My pain is sharp, burning, dull and aching  In the last 24 hours, has pain interfered with the following? General activity 8 Relation with others 8 Enjoyment of life 8 What TIME of day is your pain at its worst? morning Sleep (in general) Fair  Pain is worse with: walking, bending, inactivity and standing Pain improves with: rest and medication Relief from Meds: 6  Mobility use a cane how many minutes can you walk? 2 use a wheelchair  Function disabled: date disabled .  Neuro/Psych No problems in this area  Prior Studies Any changes since last visit?  no  Physicians involved in your care Any changes since last visit?  no   Family History  Problem Relation Age of Onset  . Cancer Mother   . Diabetes Mother   . Cancer Father    History   Social History  . Marital Status: Divorced    Spouse Name: N/A    Number of Children: N/A  . Years of Education: N/A   Social History Main Topics  . Smoking status: Current Every Day Smoker -- 0.50 packs/day    Types: Cigarettes  . Smokeless tobacco: Never Used  . Alcohol Use: No  . Drug Use: No  . Sexual Activity: None   Other Topics Concern  . None   Social History Narrative  . None   Past Surgical History  Procedure Laterality Date  . Abdominal hysterectomy    . Total knee arthroplasty    . Cholecystectomy      Past Medical History  Diagnosis Date  . Osteoporosis   . Depression   . Hypertension   . Hypothyroid   . Anemia   . Obesity    BP 116/54  Pulse 80  Resp 20  Wt 315 lb (142.883 kg)  SpO2 97%  Opioid Risk Score:   Fall Risk Score: Moderate Fall Risk (6-13 points) (previoulsy educated and handout declined)   Review of Systems  All other systems reviewed and are negative.      Objective:   Physical Exam  Nursing note and vitals reviewed. Constitutional: She is oriented to person, place, and time. She appears well-developed and well-nourished.  HENT:  Head: Normocephalic and atraumatic.  Neck: Normal range of motion. Neck supple.  Cardiovascular: Normal rate and regular rhythm.   Pulmonary/Chest: Effort normal and breath sounds normal.  Musculoskeletal:  Normal Muscle Bulk and Muscle testing reveals: Upper Extremities: Full ROM and Muscle Strength 5/5 Back without spinal or paraspinal Tenderness Right Greater Trochanteric Tenderness Lower Extremities: Right with Decreased ROM and foot extends outwards Left Full ROM and Muscle strength 5/5 Right Leg Flexion Produces Pain into right hip and Patella Arises from chair with slight difficulty/ Using Straight cane for support. Antalgic Gait    Neurological: She is alert and oriented  to person, place, and time.  Skin: Skin is warm and dry.  Psychiatric: She has a normal mood and affect.          Assessment & Plan:  1. Chronic right hip pain due to AVN with joint collapse: Continue oxycodone tid, Voltaren gel and Robaxin prn.  Refilled: Oxycodone 5 mg #100--use one pill every 8 hours prn pain. May take an extra tablet when pain is sever.  2. Low back pain: Due to RLE leg length discrepancy. She's not wearing her instep in her shoe today.  3. Balance deficits with poor endurance: She received her power-chair. She uses it in her home. Her family is looking into purchasing her a Lucianne Lei.  20 minutes of face to face patient  care time was spent during this visit. All questions were encouraged and answered.   F/U in 1 month

## 2014-06-06 ENCOUNTER — Ambulatory Visit (INDEPENDENT_AMBULATORY_CARE_PROVIDER_SITE_OTHER): Payer: Medicare Other | Admitting: Internal Medicine

## 2014-06-06 ENCOUNTER — Encounter: Payer: Self-pay | Admitting: Internal Medicine

## 2014-06-06 VITALS — BP 107/72 | HR 85 | Temp 98.6°F

## 2014-06-06 DIAGNOSIS — Z23 Encounter for immunization: Secondary | ICD-10-CM

## 2014-06-06 DIAGNOSIS — B182 Chronic viral hepatitis C: Secondary | ICD-10-CM

## 2014-06-06 LAB — CBC WITH DIFFERENTIAL/PLATELET
BASOS ABS: 0 10*3/uL (ref 0.0–0.1)
BASOS PCT: 0 % (ref 0–1)
EOS ABS: 0.1 10*3/uL (ref 0.0–0.7)
Eosinophils Relative: 2 % (ref 0–5)
HEMATOCRIT: 35.1 % — AB (ref 36.0–46.0)
HEMOGLOBIN: 12.1 g/dL (ref 12.0–15.0)
Lymphocytes Relative: 41 % (ref 12–46)
Lymphs Abs: 2.8 10*3/uL (ref 0.7–4.0)
MCH: 30.4 pg (ref 26.0–34.0)
MCHC: 34.5 g/dL (ref 30.0–36.0)
MCV: 88.2 fL (ref 78.0–100.0)
MONO ABS: 0.6 10*3/uL (ref 0.1–1.0)
MONOS PCT: 9 % (ref 3–12)
Neutro Abs: 3.3 10*3/uL (ref 1.7–7.7)
Neutrophils Relative %: 48 % (ref 43–77)
Platelets: 132 10*3/uL — ABNORMAL LOW (ref 150–400)
RBC: 3.98 MIL/uL (ref 3.87–5.11)
RDW: 14.7 % (ref 11.5–15.5)
WBC: 6.9 10*3/uL (ref 4.0–10.5)

## 2014-06-06 LAB — COMPLETE METABOLIC PANEL WITH GFR
ALK PHOS: 86 U/L (ref 39–117)
ALT: 14 U/L (ref 0–35)
AST: 27 U/L (ref 0–37)
Albumin: 3.2 g/dL — ABNORMAL LOW (ref 3.5–5.2)
BILIRUBIN TOTAL: 0.6 mg/dL (ref 0.2–1.2)
BUN: 12 mg/dL (ref 6–23)
CO2: 26 mEq/L (ref 19–32)
CREATININE: 0.6 mg/dL (ref 0.50–1.10)
Calcium: 8.8 mg/dL (ref 8.4–10.5)
Chloride: 104 mEq/L (ref 96–112)
GFR, Est African American: >89 mL/min
Glucose, Bld: 150 mg/dL — ABNORMAL HIGH (ref 70–99)
Potassium: 4.1 mEq/L (ref 3.5–5.3)
Sodium: 138 mEq/L (ref 135–145)
Total Protein: 7.6 g/dL (ref 6.0–8.3)

## 2014-06-06 NOTE — Progress Notes (Signed)
Patient comes in for follow up on HCV therapy with Harvoni.  She is  Lab Results  Component Value Date   HCVGENOTYPE 1a 03/27/2014   with elastography consistent with F4 and initial viral load  Lab Results  Component Value Date   HCVQUANT [id number]* 03/27/2014   .  Patient started Harvoni on 05/10/2014.  Has had no side effects, no complaints.  No missed doses.  Has had URI symptoms for 3 weeks.  Has not been to her PCP.     Filed Vitals:   06/06/14 1526  BP: 107/72  Pulse: 85  Temp: 98.6 F (37 C)   in no apparent distress, in wheelchair HEENT: anicteric Cor RRR clear Bowel sounds are normal peripheral pulses normal, no pedal edema, no clubbing or cyanosis negative for - jaundice, spider hemangioma, telangiectasia, palmar erythema, ecchymosis and atrophy  A/P: 1) continue with Harvoni for 12 weeks 2) Viral load today and 12 weeks 3) Viral load at [facility] and SVR24 4) Vaccines - pneumovax, hep B #2.  5) Ultrasound every 6 months Yes.   6) scheduled to get EGD with [facility].   RTC 4-5 weeks.

## 2014-06-06 NOTE — Addendum Note (Signed)
Addended by: Myrtis Hopping A on: 06/06/2014 04:28 PM   Modules accepted: Orders

## 2014-06-07 ENCOUNTER — Ambulatory Visit: Payer: Medicare Other

## 2014-06-07 LAB — HEPATITIS C RNA QUANTITATIVE: HCV Quantitative: NOT DETECTED IU/mL (ref <15)

## 2014-06-08 ENCOUNTER — Telehealth: Payer: Self-pay | Admitting: *Deleted

## 2014-06-08 NOTE — Telephone Encounter (Signed)
Left message letting patient know her labs came back, were good.  Asked her to call if she had any questions. Landis Gandy, RN

## 2014-06-08 NOTE — Telephone Encounter (Signed)
Message copied by Landis Gandy on Fri Jun 08, 2014  2:38 PM ------      Message from: Thayer Headings      Created: Fri Jun 08, 2014 11:01 AM       Please let her know that her hep C RNA is completely undetectable.  thanks ------

## 2014-06-25 ENCOUNTER — Encounter: Payer: Medicare Other | Attending: Physical Medicine and Rehabilitation | Admitting: Registered Nurse

## 2014-06-25 ENCOUNTER — Encounter: Payer: Self-pay | Admitting: Registered Nurse

## 2014-06-25 VITALS — BP 130/68 | HR 88 | Resp 14 | Ht 66.0 in | Wt 320.0 lb

## 2014-06-25 DIAGNOSIS — M24551 Contracture, right hip: Secondary | ICD-10-CM

## 2014-06-25 DIAGNOSIS — M879 Osteonecrosis, unspecified: Secondary | ICD-10-CM

## 2014-06-25 DIAGNOSIS — M2170 Unequal limb length (acquired), unspecified site: Secondary | ICD-10-CM

## 2014-06-25 DIAGNOSIS — M8700 Idiopathic aseptic necrosis of unspecified bone: Secondary | ICD-10-CM

## 2014-06-25 DIAGNOSIS — G894 Chronic pain syndrome: Secondary | ICD-10-CM | POA: Diagnosis not present

## 2014-06-25 DIAGNOSIS — M1611 Unilateral primary osteoarthritis, right hip: Secondary | ICD-10-CM

## 2014-06-25 DIAGNOSIS — Z79899 Other long term (current) drug therapy: Secondary | ICD-10-CM

## 2014-06-25 DIAGNOSIS — Z5181 Encounter for therapeutic drug level monitoring: Secondary | ICD-10-CM

## 2014-06-25 MED ORDER — OXYCODONE HCL 5 MG PO TABS: 5.0000 mg | ORAL_TABLET | Freq: Three times a day (TID) | ORAL | Status: DC | PRN

## 2014-06-25 NOTE — Progress Notes (Signed)
Subjective:    Patient ID: [name], female    DOB: 11/02/1955, 58 y.o.   MRN: [name]  HPI: [name] is a 57 year old female who returns for follow up for chronic pain and medication refill. She says her pain is located in her right shoulder, right groin, right hip and left knee knee. She rates her pain 7 Her current exercise regime is walking and gardening  has potted table of flowers she attends to. She is following with infectious disease as it relates to her Hepatitis C. She has been placed on Harvoni for 12 weeks. Her GI appointment is 07/19/2014 with [facility].  Pain Inventory Average Pain 7 Pain Right Now 7 My pain is constant, sharp, dull and aching  In the last 24 hours, has pain interfered with the following? General activity 5 Relation with others 5 Enjoyment of life 5 What TIME of day is your pain at its worst? ALL Sleep (in general) Poor  Pain is worse with: walking, bending and standing Pain improves with: rest, medication and injections Relief from Meds: 5  Mobility use a wheelchair  Function disabled: date disabled .  Neuro/Psych trouble walking  Prior Studies Any changes since last visit?  yes  Physicians involved in your care Any changes since last visit?  yes   Family History  Problem Relation Age of Onset  . Cancer Mother   . Diabetes Mother   . Cancer Father    History   Social History  . Marital Status: Divorced    Spouse Name: N/A    Number of Children: N/A  . Years of Education: N/A   Social History Main Topics  . Smoking status: Current Every Day Smoker -- 0.50 packs/day    Types: Cigarettes  . Smokeless tobacco: Never Used  . Alcohol Use: No  . Drug Use: No  . Sexual Activity: None   Other Topics Concern  . None   Social History Narrative  . None   Past Surgical History  Procedure Laterality Date  . Abdominal hysterectomy    . Total knee arthroplasty    . Cholecystectomy     Past Medical  History  Diagnosis Date  . Osteoporosis   . Depression   . Hypertension   . Hypothyroid   . Anemia   . Obesity   . Hep C w/o coma, chronic   . Hep B w/o coma   . Chronic kidney disease    BP 130/68  Pulse 88  Resp 14  Ht 5\' 6"  (1.676 m)  Wt 320 lb (145.151 kg)  BMI 51.67 kg/m2  SpO2 99%  Opioid Risk Score:   Fall Risk Score: Moderate Fall Risk (6-13 points)   Review of Systems     Objective:   Physical Exam  Nursing note and vitals reviewed. Constitutional: She is oriented to person, place, and time. She appears well-developed and well-nourished.  HENT:  Head: Normocephalic and atraumatic.  Neck: Normal range of motion. Neck supple.  Cardiovascular: Normal rate and regular rhythm.   Pulmonary/Chest: Effort normal and breath sounds normal.  Musculoskeletal:  Normal Muscle Bulk and Muscle Testing Reveals: Upper Extremities: Full ROM and Muscle Strength 5/5 Lower Extremities: Decreased ROM and Muscle Strength 4/5 Right Leg Flexion Decreased ROM and Pain Produces into Patella. No swelling or Tenderness noted. Arrived in wheelchair   Neurological: She is alert and oriented to person, place, and time.  Skin: Skin is warm and dry.  Psychiatric: She  has a normal mood and affect.          Assessment & Plan:  1. Chronic right hip pain due to AVN with joint collapse: Continue oxycodone tid, Voltaren gel and Robaxin prn.  Refilled: Oxycodone 5 mg #100--use one pill every 8 hours prn pain. May take an extra tablet when pain is sever.  2. Low back pain: Due to RLE leg length discrepancy. She's not wearing her instep in her shoe today.  3. Balance deficits with poor endurance: She has her power-chair. She uses it in her home. Her family is looking into purchasing her a Lucianne Lei.   20 minutes of face to face patient care time was spent during this visit. All questions were encouraged and answered.  F/U in 1 month

## 2014-07-12 ENCOUNTER — Encounter: Payer: Self-pay | Admitting: Internal Medicine

## 2014-07-12 ENCOUNTER — Ambulatory Visit (INDEPENDENT_AMBULATORY_CARE_PROVIDER_SITE_OTHER): Payer: Medicare Other | Admitting: Internal Medicine

## 2014-07-12 VITALS — BP 125/73 | HR 93 | Temp 98.4°F | Wt 300.0 lb

## 2014-07-12 DIAGNOSIS — B182 Chronic viral hepatitis C: Secondary | ICD-10-CM | POA: Diagnosis not present

## 2014-07-12 LAB — CBC WITH DIFFERENTIAL/PLATELET
BASOS PCT: 0 % (ref 0–1)
Basophils Absolute: 0 10*3/uL (ref 0.0–0.1)
EOS ABS: 0.2 10*3/uL (ref 0.0–0.7)
EOS PCT: 2 % (ref 0–5)
HEMATOCRIT: 35.2 % — AB (ref 36.0–46.0)
Hemoglobin: 11.9 g/dL — ABNORMAL LOW (ref 12.0–15.0)
Lymphocytes Relative: 44 % (ref 12–46)
Lymphs Abs: 3.7 10*3/uL (ref 0.7–4.0)
MCH: 30.6 pg (ref 26.0–34.0)
MCHC: 33.8 g/dL (ref 30.0–36.0)
MCV: 90.5 fL (ref 78.0–100.0)
MONOS PCT: 9 % (ref 3–12)
Monocytes Absolute: 0.8 10*3/uL (ref 0.1–1.0)
Neutro Abs: 3.8 10*3/uL (ref 1.7–7.7)
Neutrophils Relative %: 45 % (ref 43–77)
Platelets: 116 10*3/uL — ABNORMAL LOW (ref 150–400)
RBC: 3.89 MIL/uL (ref 3.87–5.11)
RDW: 14.7 % (ref 11.5–15.5)
WBC: 8.5 10*3/uL (ref 4.0–10.5)

## 2014-07-12 LAB — COMPLETE METABOLIC PANEL WITH GFR
ALBUMIN: 3.3 g/dL — AB (ref 3.5–5.2)
ALT: 14 U/L (ref 0–35)
AST: 28 U/L (ref 0–37)
Alkaline Phosphatase: 106 U/L (ref 39–117)
BILIRUBIN TOTAL: 0.5 mg/dL (ref 0.2–1.2)
BUN: 14 mg/dL (ref 6–23)
CO2: 24 mEq/L (ref 19–32)
Calcium: 9.1 mg/dL (ref 8.4–10.5)
Chloride: 105 mEq/L (ref 96–112)
Creat: 0.58 mg/dL (ref 0.50–1.10)
GFR, Est African American: >89 mL/min
GFR, Est Non African American: >89 mL/min
Glucose, Bld: 109 mg/dL — ABNORMAL HIGH (ref 70–99)
POTASSIUM: 4.2 meq/L (ref 3.5–5.3)
Sodium: 136 mEq/L (ref 135–145)
Total Protein: 7.8 g/dL (ref 6.0–8.3)

## 2014-07-12 NOTE — Assessment & Plan Note (Signed)
Doing well on meds despite some fatiuge.  Labs after she completes treatment and will follow up with me then.

## 2014-07-12 NOTE — Progress Notes (Signed)
   Subjective:    Patient ID: [name], female    DOB: Mar 06, 1956, 58 y.o.   MRN: [id number]  HPI Here for follow-up of hepatitis C on Harvoni.  She has done well in her RVR is undetectable. She is now 8 weeks into her course. Some fatigue.     Review of Systems  Constitutional: Negative for fatigue.  Gastrointestinal: Negative for nausea.  Skin: Negative for rash.  Neurological: Negative for dizziness and headaches.       Objective:   Physical Exam  Constitutional: She appears well-developed and well-nourished. No distress.  Eyes: No scleral icterus.  Cardiovascular: Normal rate, regular rhythm and normal heart sounds.   No murmur heard. Pulmonary/Chest: Effort normal and breath sounds normal. No respiratory distress.  Lymphadenopathy:    She has no cervical adenopathy.  Skin: No rash noted.          Assessment & Plan:

## 2014-07-19 ENCOUNTER — Ambulatory Visit (INDEPENDENT_AMBULATORY_CARE_PROVIDER_SITE_OTHER): Payer: Medicare Other | Admitting: Gastroenterology

## 2014-07-19 ENCOUNTER — Encounter: Payer: Self-pay | Admitting: Gastroenterology

## 2014-07-19 VITALS — BP 100/70 | HR 92 | Ht 66.0 in | Wt 321.0 lb

## 2014-07-19 DIAGNOSIS — D696 Thrombocytopenia, unspecified: Secondary | ICD-10-CM | POA: Diagnosis not present

## 2014-07-19 DIAGNOSIS — R194 Change in bowel habit: Secondary | ICD-10-CM | POA: Diagnosis not present

## 2014-07-19 DIAGNOSIS — B182 Chronic viral hepatitis C: Secondary | ICD-10-CM

## 2014-07-19 DIAGNOSIS — Z1211 Encounter for screening for malignant neoplasm of colon: Secondary | ICD-10-CM

## 2014-07-19 DIAGNOSIS — K7460 Unspecified cirrhosis of liver: Secondary | ICD-10-CM

## 2014-07-19 NOTE — Assessment & Plan Note (Signed)
Plan screening colonoscopy 

## 2014-07-19 NOTE — Patient Instructions (Signed)
You have been scheduled for a CT scan of the abdomen and pelvis at [facility] ([address]---this is in the same building as [facility]).   You are scheduled on 07/26/2014 at 1pm. You should arrive 15 minutes prior to your appointment time for registration. Please follow the written instructions below on the day of your exam:  WARNING: IF YOU ARE ALLERGIC TO IODINE/X-RAY DYE, PLEASE NOTIFY RADIOLOGY IMMEDIATELY AT [phone]! YOU WILL BE GIVEN A 13 HOUR PREMEDICATION PREP.  1) Do not eat or drink anything after 9am (4 hours prior to your test) 2) You have been given 2 bottles of oral contrast to drink. The solution may taste               better if refrigerated, but do NOT add ice or any other liquid to this solution. Shake             well before drinking.    Drink 1 bottle of contrast @ 11am (2 hours prior to your exam)  Drink 1 bottle of contrast @ 12pm (1 hour prior to your exam)  You may take any medications as prescribed with a small amount of water except for the following: Metformin, Glucophage, Glucovance, Avandamet, Riomet, Fortamet, Actoplus Met, Janumet, Glumetza or Metaglip. The above medications must be held the day of the exam AND 48 hours after the exam.  The purpose of you drinking the oral contrast is to aid in the visualization of your intestinal tract. The contrast solution may cause some diarrhea. Before your exam is started, you will be given a small amount of fluid to drink. Depending on your individual set of symptoms, you may also receive an intravenous injection of x-ray contrast/dye. Plan on being at [facility] for 30 minutes or long, depending on the type of exam you are having performed.  This test typically takes 30-45 minutes to complete.  If you have any questions regarding your exam or if you need to reschedule, you may call the CT department at [phone] between the hours of 8:00 am and 5:00 pm,  Monday-Friday.  ________________________________________________________________________    Your colonoscopy has been scheduled at [facility] Separate instructions given

## 2014-07-19 NOTE — Assessment & Plan Note (Signed)
I suspect that thrombocytopenia is reflective of portal hypertension secondary to cirrhosis.  Cirrhosis, in turn, may be due to a combination of [place] and hepatitis C.  Recommendations #1  CT the abdomen and pelvis #2 to consider upper endoscopy if cirrhotic changes are seen by CT, to rule out esophageal varices

## 2014-07-19 NOTE — Assessment & Plan Note (Signed)
Patient has had a complete response to therapy as determined by the absence of detectable virus.

## 2014-07-19 NOTE — Assessment & Plan Note (Signed)
Change in bowel habits characterized by urgency and passage of poorly formed stool may be a medication-effect.  A structural left [name] the colon should be ruled out.  Recommendations #1  Colonoscopy #2 reassess after therapy with Harvoni is completed

## 2014-07-19 NOTE — Progress Notes (Signed)
_                                                                                                                History of Present Illness:  [name] a 58 year old white female referred for evaluation of liver disease.  [name] is completing therapy with harvoni for hepatitis C.  At her last blood test there was no detectable virus as determined by HCV RNA.  Her main complaint is fatigue.blood work is pertinent for albumin of 3.2-3.3 and platelet counts of 116-132.abdominal ultrasound in September, 2015 demonstrated hepatic steatosis.  The fibrosis score by elastography was 4.  Patient has no history of alcohol use, drug use, hepatitis or jaundice.  [name] did receive multiple blood transfusions in the 1970s.    Past Medical History  Diagnosis Date  . Osteoporosis   . Depression   . Hypertension   . Hypothyroid   . Anemia   . Obesity   . Hep C w/o coma, chronic   . Hep B w/o coma   . Chronic kidney disease    Past Surgical History  Procedure Laterality Date  . Abdominal hysterectomy    . Total knee arthroplasty    . Cholecystectomy     family history includes Cancer in her father and mother; Diabetes in her mother. Current Outpatient Prescriptions  Medication Sig Dispense Refill  . amLODipine (NORVASC) 5 MG tablet Take 1 tablet (5 mg total) by mouth daily. 90 tablet 3  . clobetasol cream (TEMOVATE) 0.05 %     . diclofenac sodium (VOLTAREN) 1 % GEL Apply 2 g topically 4 (four) times daily. 2 Tube 2  . Ledipasvir-Sofosbuvir (HARVONI) 90-400 MG TABS Take 1 tablet by mouth daily. 28 tablet 2  . lisinopril (PRINIVIL,ZESTRIL) 40 MG tablet Take 1 tablet (40 mg total) by mouth daily. 90 tablet 3  . methocarbamol (ROBAXIN) 500 MG tablet Take 1 tablet (500 mg total) by mouth 2 times daily at 12 noon and 4 pm. (Patient taking differently: Take 500 mg by mouth as needed. ) 60 tablet 2  . omeprazole (PRILOSEC) 20 MG capsule Take 20 mg by mouth daily.    Marland Kitchen oxyCODONE (ROXICODONE) 5  MG immediate release tablet Take 1 tablet (5 mg total) by mouth every 8 (eight) hours as needed. May take an extra tablet when pain is sever 100 tablet 0  . sertraline (ZOLOFT) 100 MG tablet Take 100 mg by mouth daily.    . [DISCONTINUED] DULoxetine (CYMBALTA) 20 MG capsule Take 60 mg by mouth daily.     No current facility-administered medications for this visit.   Allergies as of 07/19/2014 - Review Complete 07/19/2014  Allergen Reaction Noted  . Penicillins Anaphylaxis 04/29/2009  . Demerol  10/17/2011    reports that [name] has been smoking Cigarettes.  [name] has been smoking about 0.50 packs per day. [name] has never used smokeless tobacco. [name] reports that [name] does not drink alcohol or use illicit drugs.   Review of Systems:  [name] has chronic  hip and knee pain and is not ambulatory.  [name]'s complaining of urgency when [name] has to move her bowels and some urinary urgency with urination as well.  Pertinent positive and negative review of systems were noted in the above HPI section. All other review of systems were otherwise negative.  Vital signs were reviewed in today's medical record Physical Exam: General:obese female in no acute distress Skin: anicteric Head: Normocephalic and atraumatic Eyes:  sclerae anicteric, EOMI Ears: Normal auditory acuity Mouth: No deformity or lesions Neck: Supple, no masses or thyromegaly Lungs: Clear throughout to auscultation Heart: Regular rate and rhythm; no murmurs, rubs or bruits Abdomen: Soft, non tender and non distended. No masses, hepatosplenomegaly or hernias noted. Normal Bowel sounds Rectal:deferred Musculoskeletal: Symmetrical with no gross deformities  Skin: No lesions on visible extremities Pulses:  Normal pulses noted Extremities: No clubbing, cyanosis,  or deformities noted. There are venous stasis changes and 1+ pedal edema Neurological: Alert oriented x 4, grossly nonfocal Cervical Nodes:  No significant cervical adenopathy Inguinal  Nodes: No significant inguinal adenopathy Psychological:  Alert and cooperative. Normal mood and affect  See Assessment and Plan under Problem List

## 2014-07-19 NOTE — Addendum Note (Signed)
Addended by: Oda Kilts on: 07/19/2014 04:30 PM   Modules accepted: Orders

## 2014-07-20 ENCOUNTER — Encounter (HOSPITAL_COMMUNITY): Payer: Self-pay | Admitting: *Deleted

## 2014-07-24 ENCOUNTER — Encounter: Payer: Self-pay | Admitting: Registered Nurse

## 2014-07-24 ENCOUNTER — Encounter: Payer: Medicare Other | Attending: Physical Medicine and Rehabilitation | Admitting: Registered Nurse

## 2014-07-24 VITALS — BP 130/55 | HR 76 | Resp 14

## 2014-07-24 DIAGNOSIS — M1611 Unilateral primary osteoarthritis, right hip: Secondary | ICD-10-CM | POA: Diagnosis not present

## 2014-07-24 DIAGNOSIS — Z5181 Encounter for therapeutic drug level monitoring: Secondary | ICD-10-CM | POA: Diagnosis not present

## 2014-07-24 DIAGNOSIS — M2170 Unequal limb length (acquired), unspecified site: Secondary | ICD-10-CM

## 2014-07-24 DIAGNOSIS — M24551 Contracture, right hip: Secondary | ICD-10-CM | POA: Diagnosis not present

## 2014-07-24 DIAGNOSIS — G894 Chronic pain syndrome: Secondary | ICD-10-CM | POA: Diagnosis not present

## 2014-07-24 DIAGNOSIS — Z79899 Other long term (current) drug therapy: Secondary | ICD-10-CM

## 2014-07-24 DIAGNOSIS — M879 Osteonecrosis, unspecified: Secondary | ICD-10-CM

## 2014-07-24 DIAGNOSIS — M8700 Idiopathic aseptic necrosis of unspecified bone: Secondary | ICD-10-CM

## 2014-07-24 MED ORDER — OXYCODONE HCL 5 MG PO TABS: 5.0000 mg | ORAL_TABLET | Freq: Three times a day (TID) | ORAL | Status: DC | PRN

## 2014-07-24 NOTE — Progress Notes (Signed)
Subjective:    Patient ID: [name], female    DOB: 10-Dec-1955, 58 y.o.   MRN: [name]  HPI: [name] is a 58 year old female who returns for follow up for chronic pain and medication refill. She says her pain is located in her right shoulder, right groin, right hip and left knee. She rates her pain 6. Her current exercise regime is walking with walker in her home and performing household chores.  Pain Inventory Average Pain 6 Pain Right Now 6 My pain is constant, sharp, burning, dull and aching  In the last 24 hours, has pain interfered with the following? General activity 8 Relation with others 6 Enjoyment of life 8 What TIME of day is your pain at its worst? morning Sleep (in general) Poor  Pain is worse with: walking, bending and standing Pain improves with: rest, therapy/exercise and medication Relief from Meds: 7  Mobility use a walker how many minutes can you walk? 2 ability to climb steps?  yes do you drive?  yes use a wheelchair Do you have any goals in this area?  yes  Function disabled: date disabled .  Neuro/Psych No problems in this area  Prior Studies Any changes since last visit?  yes  Physicians involved in your care Any changes since last visit?  yes Dr. Deatra Ina    GI doctor   Family History  Problem Relation Age of Onset  . Cancer Mother   . Diabetes Mother   . Cancer Father    History   Social History  . Marital Status: Divorced    Spouse Name: N/A    Number of Children: N/A  . Years of Education: N/A   Social History Main Topics  . Smoking status: Current Every Day Smoker -- 0.50 packs/day    Types: Cigarettes  . Smokeless tobacco: Never Used  . Alcohol Use: No  . Drug Use: No  . Sexual Activity: None   Other Topics Concern  . None   Social History Narrative   Past Surgical History  Procedure Laterality Date  . Abdominal hysterectomy    . Total knee arthroplasty    . Cholecystectomy     Past Medical  History  Diagnosis Date  . Osteoporosis   . Depression   . Hypertension   . Anemia   . Obesity   . Chronic kidney disease   . Hypothyroid     mild- no medication  . Transfusion history     70's, 80's ? hepatitis C attributed to past transfusions  . Hep C w/o coma, chronic     07-20-14 being presently tx. "Harvoni"-good response per pt. will complete in 7days- Dr. Linus Salmons follows [place] [facility].  . Hep B w/o coma   . Decreased mobility     hx. Rt. hip "avascular necrosis" -unable to weight bear long periods, "using wheelchair"..   BP 130/55 mmHg  Pulse 76  Resp 14  SpO2 96%  Opioid Risk Score:   Fall Risk Score: Moderate Fall Risk (6-13 points)   Review of Systems  All other systems reviewed and are negative.      Objective:   Physical Exam  Constitutional: She is oriented to person, place, and time. She appears well-developed and well-nourished.  HENT:  Head: Normocephalic and atraumatic.  Neck: Normal range of motion. Neck supple.  Cervical Paraspinal Tenderness: C-3- C-5  Cardiovascular: Normal rate and regular rhythm.   Pulmonary/Chest: Effort normal and breath sounds  normal.  Musculoskeletal:  Normal Muscle Bulk and Muscle Testing Reveals: Upper Extremities: Full ROM and Muscle Strength 5/5 Lumbar Paraspinal Tenderness: L-3-L-5 Lower Extremities: Left Full ROM and Muscle Strength 5/5 Left Leg Flexion Produces Pain into Patella Right Lower Extremity Decreased ROM and Flexion Produces Pain into Patella Arrived in wheelchair   Neurological: She is alert and oriented to person, place, and time.  Skin: Skin is warm and dry.  Psychiatric: She has a normal mood and affect.  Nursing note and vitals reviewed.         Assessment & Plan:  1. Chronic right hip pain due to AVN with joint collapse: Continue oxycodone tid, Voltaren gel and Robaxin prn.  Refilled: Oxycodone 5 mg #100--use one pill every 8 hours prn pain. May take an extra tablet  when pain is sever.  2. Low back pain: Due to RLE leg length discrepancy. She's not wearing her instep in her shoe today.  3. Balance deficits with poor endurance: She has her power-chair. She uses it in her home. They purchased a Lucianne Lei she will start using her motorized wheelchair to increse her activity.   20 minutes of face to face patient care time was spent during this visit. All questions were encouraged and answered.   F/U in 1 month

## 2014-07-26 ENCOUNTER — Ambulatory Visit (INDEPENDENT_AMBULATORY_CARE_PROVIDER_SITE_OTHER)
Admission: RE | Admit: 2014-07-26 | Discharge: 2014-07-26 | Disposition: A | Discharge status: Home / Self Care | Payer: Medicare Other | Source: Ambulatory Visit | Attending: Gastroenterology | Admitting: Gastroenterology

## 2014-07-26 DIAGNOSIS — K7460 Unspecified cirrhosis of liver: Secondary | ICD-10-CM | POA: Diagnosis not present

## 2014-07-26 DIAGNOSIS — E279 Disorder of adrenal gland, unspecified: Secondary | ICD-10-CM | POA: Diagnosis not present

## 2014-07-26 DIAGNOSIS — B1920 Unspecified viral hepatitis C without hepatic coma: Secondary | ICD-10-CM | POA: Diagnosis not present

## 2014-07-26 MED ORDER — IOHEXOL 350 MG/ML SOLN
100.0000 mL | Freq: Once | INTRAVENOUS | Status: AC | PRN
  Administered 2014-07-26: 100 mL via INTRAVENOUS

## 2014-07-30 ENCOUNTER — Telehealth: Payer: Self-pay | Admitting: Gastroenterology

## 2014-07-30 ENCOUNTER — Telehealth: Payer: Self-pay

## 2014-07-30 NOTE — Telephone Encounter (Signed)
Left message for pt to call back  °

## 2014-07-30 NOTE — Telephone Encounter (Signed)
Patient came in a day early for her colonosocpy. She is prepped . Endo is unsuccessful in contacting Dr Deatra Ina. Patient wants to come back 07/31/14. She is instructed to remain on clear liquids.

## 2014-07-31 ENCOUNTER — Ambulatory Visit (HOSPITAL_COMMUNITY): Admission: RE | Admit: 2014-07-31 | Payer: Medicare Other | Source: Ambulatory Visit | Admitting: Gastroenterology

## 2014-07-31 ENCOUNTER — Encounter (HOSPITAL_COMMUNITY): Payer: Self-pay | Admitting: *Deleted

## 2014-07-31 HISTORY — DX: Personal history of other medical treatment: Z92.89

## 2014-07-31 HISTORY — DX: Other abnormalities of gait and mobility: R26.89

## 2014-07-31 SURGERY — COLONOSCOPY WITH PROPOFOL
Anesthesia: Monitor Anesthesia Care

## 2014-07-31 MED ORDER — PROPOFOL 10 MG/ML IV BOLUS
INTRAVENOUS | Status: AC
  Filled 2014-07-31: qty 20

## 2014-07-31 NOTE — Telephone Encounter (Signed)
I have left message for the patient to call back. Left message asking if she would like to get rescheduled.

## 2014-08-01 ENCOUNTER — Other Ambulatory Visit: Payer: Medicare Other

## 2014-08-01 DIAGNOSIS — B182 Chronic viral hepatitis C: Secondary | ICD-10-CM

## 2014-08-06 LAB — HEPATITIS C RNA QUANTITATIVE: HCV QUANT: NOT DETECTED [IU]/mL (ref <15)

## 2014-08-07 ENCOUNTER — Encounter: Payer: Self-pay | Admitting: Internal Medicine

## 2014-08-07 ENCOUNTER — Ambulatory Visit (INDEPENDENT_AMBULATORY_CARE_PROVIDER_SITE_OTHER): Payer: Medicare Other | Admitting: Internal Medicine

## 2014-08-07 VITALS — BP 114/61 | HR 75 | Temp 97.7°F | Ht 66.5 in | Wt 310.0 lb

## 2014-08-07 DIAGNOSIS — B182 Chronic viral hepatitis C: Secondary | ICD-10-CM | POA: Diagnosis not present

## 2014-08-07 DIAGNOSIS — K7460 Unspecified cirrhosis of liver: Secondary | ICD-10-CM | POA: Diagnosis not present

## 2014-08-07 NOTE — Assessment & Plan Note (Signed)
EGD per Dr. Deatra Ina, appreciate assistance.  Will need [facility] screening with ultrasound every 6 months. I will arrange after next appt in 3 months unless Dr. Deatra Ina is going to monitor [facility] screening.

## 2014-08-07 NOTE — Assessment & Plan Note (Signed)
Doing great.  Will confirm cure with SVR 12 in 3 months.

## 2014-08-07 NOTE — Progress Notes (Signed)
   Subjective:    Patient ID: [name], female    DOB: July 18, 1956, 58 y.o.   MRN: [id number]  HPI Here for follow-up of hepatitis C on Harvoni.  She has done well and post treatment vl is undetectable.  No new issues.  Pleased to be finished.  Had EGD and screening colonoscopy scheduled but developed diarrhea after CT and had to cancel.  CT scan done and    Review of Systems  Constitutional: Negative for fatigue.  Gastrointestinal: Negative for nausea.  Skin: Negative for rash.  Neurological: Negative for dizziness and headaches.       Objective:   Physical Exam  Constitutional: She appears well-developed and well-nourished. No distress.  Eyes: No scleral icterus.  Cardiovascular: Normal rate, regular rhythm and normal heart sounds.   No murmur heard. Pulmonary/Chest: Effort normal and breath sounds normal. No respiratory distress.  Lymphadenopathy:    She has no cervical adenopathy.  Skin: No rash noted.          Assessment & Plan:

## 2014-08-22 ENCOUNTER — Encounter: Payer: Medicare Other | Attending: Physical Medicine and Rehabilitation | Admitting: Registered Nurse

## 2014-08-22 ENCOUNTER — Encounter: Payer: Self-pay | Admitting: Registered Nurse

## 2014-08-22 VITALS — BP 131/64 | HR 84 | Resp 14

## 2014-08-22 DIAGNOSIS — M7551 Bursitis of right shoulder: Secondary | ICD-10-CM

## 2014-08-22 DIAGNOSIS — M24551 Contracture, right hip: Secondary | ICD-10-CM | POA: Diagnosis not present

## 2014-08-22 DIAGNOSIS — M2170 Unequal limb length (acquired), unspecified site: Secondary | ICD-10-CM

## 2014-08-22 DIAGNOSIS — Z79899 Other long term (current) drug therapy: Secondary | ICD-10-CM

## 2014-08-22 DIAGNOSIS — M1611 Unilateral primary osteoarthritis, right hip: Secondary | ICD-10-CM

## 2014-08-22 DIAGNOSIS — M879 Osteonecrosis, unspecified: Secondary | ICD-10-CM | POA: Diagnosis not present

## 2014-08-22 DIAGNOSIS — Z5181 Encounter for therapeutic drug level monitoring: Secondary | ICD-10-CM

## 2014-08-22 DIAGNOSIS — M8700 Idiopathic aseptic necrosis of unspecified bone: Secondary | ICD-10-CM

## 2014-08-22 DIAGNOSIS — G894 Chronic pain syndrome: Secondary | ICD-10-CM | POA: Diagnosis not present

## 2014-08-22 MED ORDER — OXYCODONE HCL 5 MG PO TABS: 5.0000 mg | ORAL_TABLET | Freq: Three times a day (TID) | ORAL | Status: DC | PRN

## 2014-08-22 MED ORDER — METHYLPREDNISOLONE 4 MG PO KIT: PACK | ORAL | Status: DC

## 2014-08-22 NOTE — Progress Notes (Signed)
Subjective:    Patient ID: [name], female    DOB: 10/03/1955, 58 y.o.   MRN: [name]  HPI: [name] is a 58 year old female who returns for follow up for chronic pain and medication refill. She says her pain is located in her right shoulder,right hip and right knee. She rates her pain 8. Her current exercise regime is walking with walker in her home and uses motorized wheelchair for outside activities.  Arrived to office in wheelchair.  Pain Inventory Average Pain 7 Pain Right Now 8 My pain is constant  In the last 24 hours, has pain interfered with the following? General activity 8 Relation with others 7 Enjoyment of life 7 What TIME of day is your pain at its worst? morning Sleep (in general) Fair  Pain is worse with: walking, bending, inactivity and standing Pain improves with: rest, therapy/exercise and medication Relief from Meds: 7  Mobility walk with assistance use a walker how many minutes can you walk? 2 ability to climb steps?  no do you drive?  yes use a wheelchair Do you have any goals in this area?  yes  Function disabled: date disabled .  Neuro/Psych No problems in this area  Prior Studies Any changes since last visit?  no  Physicians involved in your care Any changes since last visit?  no   Family History  Problem Relation Age of Onset  . Cancer Mother   . Diabetes Mother   . Cancer Father    History   Social History  . Marital Status: Divorced    Spouse Name: N/A    Number of Children: N/A  . Years of Education: N/A   Social History Main Topics  . Smoking status: Current Every Day Smoker -- 0.50 packs/day    Types: Cigarettes  . Smokeless tobacco: Never Used  . Alcohol Use: No  . Drug Use: No  . Sexual Activity: None   Other Topics Concern  . None   Social History Narrative   Past Surgical History  Procedure Laterality Date  . Abdominal hysterectomy    . Total knee arthroplasty    . Cholecystectomy      Past Medical History  Diagnosis Date  . Osteoporosis   . Depression   . Hypertension   . Anemia   . Obesity   . Chronic kidney disease   . Hypothyroid     mild- no medication  . Transfusion history     70's, 80's ? hepatitis C attributed to past transfusions  . Hep C w/o coma, chronic     07-20-14 being presently tx. "Harvoni"-good response per pt. will complete in 7days- Dr. Linus Salmons follows [place] [facility].  . Hep B w/o coma   . Decreased mobility     hx. Rt. hip "avascular necrosis" -unable to weight bear long periods, "using wheelchair"..   BP 131/64 mmHg  Pulse 84  Resp 14  SpO2 97%  Opioid Risk Score:   Fall Risk Score: Moderate Fall Risk (6-13 points) (pt declined pamphlet) Review of Systems  All other systems reviewed and are negative.      Objective:   Physical Exam  Constitutional: She is oriented to person, place, and time. She appears well-developed and well-nourished.  HENT:  Head: Normocephalic and atraumatic.  Neck: Normal range of motion. Neck supple.  Cardiovascular: Normal rate and regular rhythm.   Pulmonary/Chest: Effort normal and breath sounds normal.  Musculoskeletal:  Normal Muscle Bulk  and Muscle testing Reveals: Upper Extremities: Full ROM and Muscle strength 5/5 Right AC Joint Tenderness Thoracic Paraspinal Tenderness: T-1- T-2 Lower Extremities: Left Full ROM and Muscle strength 5/5 Right Decreased ROM Right Leg Flexion Produces pain into Right Hip and Right Knee Arrived in wheelchair  Neurological: She is alert and oriented to person, place, and time.  Skin: Skin is warm and dry.  Psychiatric: She has a normal mood and affect.  Nursing note and vitals reviewed.         Assessment & Plan:  1. Chronic right hip pain due to AVN with joint collapse: Continue oxycodone tid, Voltaren gel and Robaxin prn.  Refilled: Oxycodone 5 mg #100--use one pill every 8 hours prn pain. May take an extra tablet when pain is  sever.  2. Low back pain: Due to RLE leg length discrepancy.  No complaints voiced today. 3. Balance deficits with poor endurance: She has her power-chair. She uses it in her home. They purchased a Lucianne Lei she will start using her motorized wheelchair to increse her activity.   20 minutes of face to face patient care time was spent during this visit. All questions were encouraged and answered.   F/U in 1 month

## 2014-09-17 ENCOUNTER — Ambulatory Visit: Payer: Medicare Other | Admitting: Physical Medicine & Rehabilitation

## 2014-09-18 ENCOUNTER — Encounter: Payer: Self-pay | Admitting: Registered Nurse

## 2014-09-18 ENCOUNTER — Encounter: Payer: Medicare Other | Attending: Registered Nurse | Admitting: Registered Nurse

## 2014-09-18 ENCOUNTER — Other Ambulatory Visit: Payer: Self-pay | Admitting: Physical Medicine & Rehabilitation

## 2014-09-18 VITALS — BP 140/59 | HR 75 | Resp 20

## 2014-09-18 DIAGNOSIS — M8700 Idiopathic aseptic necrosis of unspecified bone: Secondary | ICD-10-CM

## 2014-09-18 DIAGNOSIS — M1611 Unilateral primary osteoarthritis, right hip: Secondary | ICD-10-CM

## 2014-09-18 DIAGNOSIS — Z760 Encounter for issue of repeat prescription: Secondary | ICD-10-CM | POA: Insufficient documentation

## 2014-09-18 DIAGNOSIS — Z79899 Other long term (current) drug therapy: Secondary | ICD-10-CM

## 2014-09-18 DIAGNOSIS — M24551 Contracture, right hip: Secondary | ICD-10-CM

## 2014-09-18 DIAGNOSIS — Z5181 Encounter for therapeutic drug level monitoring: Secondary | ICD-10-CM | POA: Diagnosis not present

## 2014-09-18 DIAGNOSIS — M545 Low back pain: Secondary | ICD-10-CM | POA: Insufficient documentation

## 2014-09-18 DIAGNOSIS — M879 Osteonecrosis, unspecified: Secondary | ICD-10-CM | POA: Insufficient documentation

## 2014-09-18 DIAGNOSIS — M2170 Unequal limb length (acquired), unspecified site: Secondary | ICD-10-CM

## 2014-09-18 DIAGNOSIS — G894 Chronic pain syndrome: Secondary | ICD-10-CM

## 2014-09-18 DIAGNOSIS — G8929 Other chronic pain: Secondary | ICD-10-CM | POA: Diagnosis not present

## 2014-09-18 DIAGNOSIS — M25551 Pain in right hip: Secondary | ICD-10-CM | POA: Insufficient documentation

## 2014-09-18 MED ORDER — OXYCODONE HCL 5 MG PO TABS: 5.0000 mg | ORAL_TABLET | Freq: Three times a day (TID) | ORAL | Status: DC | PRN

## 2014-09-18 NOTE — Progress Notes (Signed)
Subjective:    Patient ID: [name], female    DOB: Apr 24, 1956, 59 y.o.   MRN: [id number]  HPI: [name] is a 59 year old female who returns for follow up for chronic pain and medication refill. She says her pain is located in her lower back,right hip and right leg. She rates her pain 6. Her current exercise regime is walking with walker in her home and performing stretching exercises she's using her motorized wheelchair for outside activities.  Arrived to office in motorized wheelchair.  Pain Inventory Average Pain 9 Pain Right Now 6 My pain is intermittent  In the last 24 hours, has pain interfered with the following? General activity 7 Relation with others 6 Enjoyment of life 8 What TIME of day is your pain at its worst? morning Sleep (in general) Fair  Pain is worse with: walking, bending, sitting, inactivity and standing Pain improves with: rest and medication Relief from Meds: 6  Mobility use a walker ability to climb steps?  no do you drive?  yes  Function disabled: date disabled .  Neuro/Psych trouble walking  Prior Studies Any changes since last visit?  no  Physicians involved in your care Any changes since last visit?  no   Family History  Problem Relation Age of Onset  . Cancer Mother   . Diabetes Mother   . Cancer Father    History   Social History  . Marital Status: Divorced    Spouse Name: N/A    Number of Children: N/A  . Years of Education: N/A   Social History Main Topics  . Smoking status: Current Every Day Smoker -- 0.50 packs/day    Types: Cigarettes  . Smokeless tobacco: Never Used  . Alcohol Use: No  . Drug Use: No  . Sexual Activity: None   Other Topics Concern  . None   Social History Narrative   Past Surgical History  Procedure Laterality Date  . Abdominal hysterectomy    . Total knee arthroplasty    . Cholecystectomy     Past Medical History  Diagnosis Date  . Osteoporosis   . Depression   .  Hypertension   . Anemia   . Obesity   . Chronic kidney disease   . Hypothyroid     mild- no medication  . Transfusion history     70's, 80's ? hepatitis C attributed to past transfusions  . Hep C w/o coma, chronic     07-20-14 being presently tx. "Harvoni"-good response per pt. will complete in 7days- Dr. Linus Salmons follows [place] [facility].  . Hep B w/o coma   . Decreased mobility     hx. Rt. hip "avascular necrosis" -unable to weight bear long periods, "using wheelchair"..   BP 140/59 mmHg  Pulse 75  Resp 20  SpO2 96%  Opioid Risk Score:   Fall Risk Score: Moderate Fall Risk (6-13 points) (previously educated and declined handout)  Review of Systems  Musculoskeletal: Positive for gait problem.  All other systems reviewed and are negative.      Objective:   Physical Exam  Constitutional: She is oriented to person, place, and time. She appears well-developed and well-nourished.  HENT:  Head: Normocephalic and atraumatic.  Neck: Normal range of motion. Neck supple.  Cardiovascular: Normal rate and regular rhythm.   Pulmonary/Chest: Effort normal and breath sounds normal.  Musculoskeletal:  Normal Muscle Bulk and Muscle testing Reveals: Upper Extremities: Full ROM and Muscle Strength  5/5 Thoracic and Lumbar Hypersensitivity Lower Extremities: Right: Decreased ROM with Flexion and Muscle strength 4/5 Left Full ROM and Muscle Strength 5/5 Left Leg Flexion Produces pain into Patella Arrived in Motorized wheelchair  Neurological: She is alert and oriented to person, place, and time.  Skin: Skin is warm and dry.  Psychiatric: She has a normal mood and affect.  Nursing note and vitals reviewed.         Assessment & Plan:  1. Chronic right hip pain due to AVN with joint collapse: Continue oxycodone tid, Voltaren gel and Robaxin prn.  Refilled: Oxycodone 5 mg #100--use one pill every 8 hours prn pain. May take an extra tablet when pain is sever.  2.  Low back pain: Due to RLE leg length discrepancy.  No complaints voiced today. 3. Balance deficits with poor endurance: Continue using her motorized wheelchair to increse her activity.   20 minutes of face to face patient care time was spent during this visit. All questions were encouraged and answered.   F/U in 1 month

## 2014-09-19 LAB — PMP ALCOHOL METABOLITE (ETG): Ethyl Glucuronide (EtG): NEGATIVE ng/mL

## 2014-09-22 LAB — PRESCRIPTION MONITORING PROFILE (SOLSTAS)
Amphetamine/Meth: NEGATIVE ng/mL
BARBITURATE SCREEN, URINE: NEGATIVE ng/mL
BENZODIAZEPINE SCREEN, URINE: NEGATIVE ng/mL
Buprenorphine, Urine: NEGATIVE ng/mL
CANNABINOID SCRN UR: NEGATIVE ng/mL
CARISOPRODOL, URINE: NEGATIVE ng/mL
CREATININE, URINE: 238.25 mg/dL (ref >20.0)
Cocaine Metabolites: NEGATIVE ng/mL
Fentanyl, Ur: NEGATIVE ng/mL
MDMA URINE: NEGATIVE ng/mL
Meperidine, Ur: NEGATIVE ng/mL
Methadone Screen, Urine: NEGATIVE ng/mL
NITRITES URINE, INITIAL: NEGATIVE ug/mL
PROPOXYPHENE: NEGATIVE ng/mL
Tapentadol, urine: NEGATIVE ng/mL
Tramadol Scrn, Ur: NEGATIVE ng/mL
Zolpidem, Urine: NEGATIVE ng/mL
pH, Initial: 6 pH (ref 4.5–8.9)

## 2014-09-22 LAB — OXYCODONE, URINE (LC/MS-MS)
NOROXYCODONE, UR: 703 ng/mL (ref <50)
Oxycodone, ur: 1057 ng/mL (ref <50)
Oxymorphone: 262 ng/mL (ref <50)

## 2014-09-22 LAB — OPIATES/OPIOIDS (LC/MS-MS)
Codeine Urine: NEGATIVE ng/mL (ref <50)
HYDROCODONE: NEGATIVE ng/mL (ref <50)
Hydromorphone: NEGATIVE ng/mL (ref <50)
Morphine Urine: NEGATIVE ng/mL (ref <50)
Norhydrocodone, Ur: NEGATIVE ng/mL (ref <50)
Noroxycodone, Ur: 703 ng/mL (ref <50)
OXYCODONE, UR: 1057 ng/mL (ref <50)
Oxymorphone: 262 ng/mL (ref <50)

## 2014-10-11 NOTE — Progress Notes (Signed)
Urine drug screen for this encounter is consistent for prescribed medication 

## 2014-10-16 ENCOUNTER — Encounter: Payer: Self-pay | Admitting: Registered Nurse

## 2014-10-16 ENCOUNTER — Encounter: Payer: Medicare Other | Attending: Physical Medicine and Rehabilitation | Admitting: Registered Nurse

## 2014-10-16 VITALS — BP 110/62 | HR 86 | Resp 14

## 2014-10-16 DIAGNOSIS — M24551 Contracture, right hip: Secondary | ICD-10-CM | POA: Diagnosis not present

## 2014-10-16 DIAGNOSIS — G894 Chronic pain syndrome: Secondary | ICD-10-CM | POA: Diagnosis not present

## 2014-10-16 DIAGNOSIS — Z5181 Encounter for therapeutic drug level monitoring: Secondary | ICD-10-CM

## 2014-10-16 DIAGNOSIS — M2170 Unequal limb length (acquired), unspecified site: Secondary | ICD-10-CM

## 2014-10-16 DIAGNOSIS — Z79899 Other long term (current) drug therapy: Secondary | ICD-10-CM | POA: Diagnosis not present

## 2014-10-16 DIAGNOSIS — M879 Osteonecrosis, unspecified: Secondary | ICD-10-CM | POA: Diagnosis not present

## 2014-10-16 DIAGNOSIS — M8700 Idiopathic aseptic necrosis of unspecified bone: Secondary | ICD-10-CM

## 2014-10-16 MED ORDER — DICLOFENAC SODIUM 1 % TD GEL: 2.0000 g | Freq: Four times a day (QID) | TRANSDERMAL | Status: DC

## 2014-10-16 MED ORDER — METHOCARBAMOL 500 MG PO TABS: 500.0000 mg | ORAL_TABLET | Freq: Two times a day (BID) | ORAL | Status: DC

## 2014-10-16 MED ORDER — OXYCODONE HCL 5 MG PO TABS: 5.0000 mg | ORAL_TABLET | Freq: Three times a day (TID) | ORAL | Status: DC | PRN

## 2014-10-16 NOTE — Progress Notes (Signed)
Subjective:    Patient ID: [name], female    DOB: 02-26-1956, 59 y.o.   MRN: [name]  HPI: [name] is a 59 year old female who returns for follow up for chronic pain and medication refill. She says her pain is located in her lower back,right hip and right leg. She rates her pain 8. Her current exercise regime is walking with walker in her home and performing stretching and chair exercises. She admits being depressed with physical health and limited mobility, denies suicidal ideation or plan. Encouraged to continue with increase activity. She verbalizes understanding. Arrived to office in motorized wheelchair. Her pain has intensified with the cold weather, she is short on her medications 4 days. Will allow her to fill early today.  Pain Inventory Average Pain 7 Pain Right Now 8 My pain is constant, burning, stabbing and aching  In the last 24 hours, has pain interfered with the following? General activity 5 Relation with others 5 Enjoyment of life 5 What TIME of day is your pain at its worst? VARIES Sleep (in general) Fair  Pain is worse with: walking, standing and some activites Pain improves with: rest and medication Relief from Meds: 5  Mobility ability to climb steps?  no do you drive?  yes use a wheelchair  Function disabled: date disabled .  Neuro/Psych weakness trouble walking  Prior Studies Any changes since last visit?  no  Physicians involved in your care Any changes since last visit?  no   Family History  Problem Relation Age of Onset  . Cancer Mother   . Diabetes Mother   . Cancer Father    History   Social History  . Marital Status: Divorced    Spouse Name: N/A    Number of Children: N/A  . Years of Education: N/A   Social History Main Topics  . Smoking status: Current Every Day Smoker -- 0.50 packs/day    Types: Cigarettes  . Smokeless tobacco: Never Used  . Alcohol Use: No  . Drug Use: No  . Sexual Activity: None    Other Topics Concern  . None   Social History Narrative   Past Surgical History  Procedure Laterality Date  . Abdominal hysterectomy    . Total knee arthroplasty    . Cholecystectomy     Past Medical History  Diagnosis Date  . Osteoporosis   . Depression   . Hypertension   . Anemia   . Obesity   . Chronic kidney disease   . Hypothyroid     mild- no medication  . Transfusion history     70's, 80's ? hepatitis C attributed to past transfusions  . Hep C w/o coma, chronic     07-20-14 being presently tx. "Harvoni"-good response per pt. will complete in 7days- Dr. Linus Salmons follows [place] [facility].  . Hep B w/o coma   . Decreased mobility     hx. Rt. hip "avascular necrosis" -unable to weight bear long periods, "using wheelchair"..   BP 110/62 mmHg  Pulse 86  Resp 14  SpO2 99%  Opioid Risk Score:   Fall Risk Score: Low Fall Risk (0-5 points)  Review of Systems  HENT: Negative.   Eyes: Negative.   Respiratory: Negative.   Cardiovascular: Negative.   Gastrointestinal: Negative.   Endocrine: Negative.   Genitourinary: Negative.   Musculoskeletal: Positive for myalgias, back pain and arthralgias.  Skin: Negative.   Allergic/Immunologic: Negative.   Neurological: Positive  for weakness.       Trouble walking, spasms  Hematological: Negative.   Psychiatric/Behavioral: Negative.        Objective:   Physical Exam  Constitutional: She is oriented to person, place, and time. She appears well-developed and well-nourished.  HENT:  Head: Normocephalic and atraumatic.  Neck: Normal range of motion. Neck supple.  Cardiovascular: Normal rate and regular rhythm.   Pulmonary/Chest: Effort normal and breath sounds normal.  Musculoskeletal: She exhibits edema.  Normal Muscle Bulk and Muscle Testing Reveals: Upper Extremities: Full ROM and Muscle strength 5/5 Lumbar Paraspinal Tenderness: L-3- L-5 Right Greater Trochanteric tenderness Lower Extremities:  Left Full ROM and Muscle strength 5/5 Right: Decreased ROM and Muscle strength 4/5 Right Lower extremity Flexion Produces pain into Patella Arrived in Motorizes wheelchair  Neurological: She is alert and oriented to person, place, and time.  Skin: Skin is warm and dry.  Psychiatric: She has a normal mood and affect.  Nursing note and vitals reviewed.         Assessment & Plan:  1. Chronic right hip pain due to AVN with joint collapse: Continue oxycodone tid, Voltaren gel and Robaxin prn.  Refilled: Oxycodone 5 mg #100--use one pill every 8 hours prn pain. May take an extra tablet when pain is sever.  2. Low back pain: Due to RLE leg length discrepancy.  Continue with current medication regime and heat therapy. 3. Balance deficits with poor endurance: Continue using her motorized wheelchair to increse her activity.   20 minutes of face to face patient care time was spent during this visit. All questions were encouraged and answered.   F/U in 1 month

## 2014-11-02 ENCOUNTER — Encounter: Payer: Self-pay | Admitting: Internal Medicine

## 2014-11-06 ENCOUNTER — Other Ambulatory Visit: Payer: Medicare Other

## 2014-11-06 DIAGNOSIS — B182 Chronic viral hepatitis C: Secondary | ICD-10-CM | POA: Diagnosis not present

## 2014-11-07 LAB — HEPATITIS C RNA QUANTITATIVE: HCV QUANT: NOT DETECTED [IU]/mL (ref <15)

## 2014-11-12 ENCOUNTER — Ambulatory Visit: Payer: Medicare Other | Attending: Internal Medicine | Admitting: Internal Medicine

## 2014-11-12 ENCOUNTER — Encounter: Payer: Self-pay | Admitting: Registered Nurse

## 2014-11-12 ENCOUNTER — Encounter: Payer: Self-pay | Admitting: Internal Medicine

## 2014-11-12 ENCOUNTER — Encounter: Payer: Medicare Other | Attending: Physical Medicine and Rehabilitation | Admitting: Registered Nurse

## 2014-11-12 VITALS — BP 116/75 | HR 87 | Resp 14

## 2014-11-12 VITALS — BP 111/74 | HR 81 | Temp 98.0°F | Resp 16

## 2014-11-12 DIAGNOSIS — I129 Hypertensive chronic kidney disease with stage 1 through stage 4 chronic kidney disease, or unspecified chronic kidney disease: Secondary | ICD-10-CM | POA: Diagnosis not present

## 2014-11-12 DIAGNOSIS — I10 Essential (primary) hypertension: Secondary | ICD-10-CM | POA: Diagnosis not present

## 2014-11-12 DIAGNOSIS — I83892 Varicose veins of left lower extremities with other complications: Secondary | ICD-10-CM | POA: Insufficient documentation

## 2014-11-12 DIAGNOSIS — Z5181 Encounter for therapeutic drug level monitoring: Secondary | ICD-10-CM | POA: Diagnosis not present

## 2014-11-12 DIAGNOSIS — F329 Major depressive disorder, single episode, unspecified: Secondary | ICD-10-CM | POA: Diagnosis not present

## 2014-11-12 DIAGNOSIS — Z96659 Presence of unspecified artificial knee joint: Secondary | ICD-10-CM | POA: Insufficient documentation

## 2014-11-12 DIAGNOSIS — K529 Noninfective gastroenteritis and colitis, unspecified: Secondary | ICD-10-CM | POA: Diagnosis not present

## 2014-11-12 DIAGNOSIS — Z79899 Other long term (current) drug therapy: Secondary | ICD-10-CM

## 2014-11-12 DIAGNOSIS — R21 Rash and other nonspecific skin eruption: Secondary | ICD-10-CM | POA: Diagnosis not present

## 2014-11-12 DIAGNOSIS — G894 Chronic pain syndrome: Secondary | ICD-10-CM | POA: Diagnosis not present

## 2014-11-12 DIAGNOSIS — N189 Chronic kidney disease, unspecified: Secondary | ICD-10-CM | POA: Insufficient documentation

## 2014-11-12 DIAGNOSIS — Z90710 Acquired absence of both cervix and uterus: Secondary | ICD-10-CM | POA: Insufficient documentation

## 2014-11-12 DIAGNOSIS — M879 Osteonecrosis, unspecified: Secondary | ICD-10-CM

## 2014-11-12 DIAGNOSIS — M2170 Unequal limb length (acquired), unspecified site: Secondary | ICD-10-CM

## 2014-11-12 DIAGNOSIS — I8392 Asymptomatic varicose veins of left lower extremity: Secondary | ICD-10-CM | POA: Diagnosis not present

## 2014-11-12 DIAGNOSIS — M24551 Contracture, right hip: Secondary | ICD-10-CM

## 2014-11-12 DIAGNOSIS — R197 Diarrhea, unspecified: Secondary | ICD-10-CM | POA: Diagnosis not present

## 2014-11-12 DIAGNOSIS — Z9049 Acquired absence of other specified parts of digestive tract: Secondary | ICD-10-CM | POA: Insufficient documentation

## 2014-11-12 DIAGNOSIS — B182 Chronic viral hepatitis C: Secondary | ICD-10-CM | POA: Insufficient documentation

## 2014-11-12 DIAGNOSIS — M8700 Idiopathic aseptic necrosis of unspecified bone: Secondary | ICD-10-CM

## 2014-11-12 LAB — COMPLETE METABOLIC PANEL WITH GFR
ALBUMIN: 3.6 g/dL (ref 3.5–5.2)
ALK PHOS: 84 U/L (ref 39–117)
ALT: 11 U/L (ref 0–35)
AST: 18 U/L (ref 0–37)
BILIRUBIN TOTAL: 0.5 mg/dL (ref 0.2–1.2)
BUN: 12 mg/dL (ref 6–23)
CO2: 27 mEq/L (ref 19–32)
Calcium: 9 mg/dL (ref 8.4–10.5)
Chloride: 105 mEq/L (ref 96–112)
Creat: 0.54 mg/dL (ref 0.50–1.10)
GFR, Est African American: >89 mL/min
GLUCOSE: 100 mg/dL — AB (ref 70–99)
POTASSIUM: 4.5 meq/L (ref 3.5–5.3)
SODIUM: 140 meq/L (ref 135–145)
Total Protein: 7.7 g/dL (ref 6.0–8.3)

## 2014-11-12 LAB — MAGNESIUM: MAGNESIUM: 1.9 mg/dL (ref 1.5–2.5)

## 2014-11-12 MED ORDER — AMLODIPINE BESYLATE 5 MG PO TABS: 5.0000 mg | ORAL_TABLET | Freq: Every day | ORAL | Status: DC

## 2014-11-12 MED ORDER — LISINOPRIL 40 MG PO TABS: 40.0000 mg | ORAL_TABLET | Freq: Every day | ORAL | Status: DC

## 2014-11-12 MED ORDER — OXYCODONE HCL 5 MG PO TABS: 5.0000 mg | ORAL_TABLET | Freq: Three times a day (TID) | ORAL | Status: DC | PRN

## 2014-11-12 MED ORDER — MICONAZOLE NITRATE 2 % EX CREA: 1.0000 "application " | TOPICAL_CREAM | Freq: Two times a day (BID) | CUTANEOUS | Status: DC

## 2014-11-12 NOTE — Progress Notes (Signed)
Subjective:    Patient ID: [name], female    DOB: 22-Feb-1956, 59 y.o.   MRN: [name]  HPI: [name] is a 59 year old female who returns for follow up for chronic pain and medication refill. She says her pain is located in her lower back,right hip and right leg. She rates her pain 7. She states "her left lower extremity painful due to phlebitis her PCP following". Her current exercise regime is walking with walker in her home and performing stretching and chair exercises.  Pain Inventory Average Pain 8 Pain Right Now 7 My pain is constant, burning, stabbing and aching  In the last 24 hours, has pain interfered with the following? General activity 8 Relation with others 8 Enjoyment of life 8 What TIME of day is your pain at its worst? daytime Sleep (in general) Poor  Pain is worse with: walking, bending, inactivity and standing Pain improves with: rest, therapy/exercise and injections Relief from Meds: 6  Mobility walk with assistance use a walker how many minutes can you walk? 2 ability to climb steps?  no use a wheelchair Do you have any goals in this area?  yes  Function disabled: date disabled .  Neuro/Psych No problems in this area  Prior Studies Any changes since last visit?  no  Physicians involved in your care Any changes since last visit?  no   Family History  Problem Relation Age of Onset  . Cancer Mother   . Diabetes Mother   . Cancer Father    History   Social History  . Marital Status: Divorced    Spouse Name: N/A  . Number of Children: N/A  . Years of Education: N/A   Social History Main Topics  . Smoking status: Current Every Day Smoker -- 0.50 packs/day    Types: Cigarettes  . Smokeless tobacco: Never Used  . Alcohol Use: No  . Drug Use: No  . Sexual Activity: Not on file   Other Topics Concern  . None   Social History Narrative   Past Surgical History  Procedure Laterality Date  . Abdominal hysterectomy    .  Total knee arthroplasty    . Cholecystectomy     Past Medical History  Diagnosis Date  . Osteoporosis   . Depression   . Hypertension   . Anemia   . Obesity   . Chronic kidney disease   . Hypothyroid     mild- no medication  . Transfusion history     70's, 80's ? hepatitis C attributed to past transfusions  . Hep C w/o coma, chronic     07-20-14 being presently tx. "Harvoni"-good response per pt. will complete in 7days- Dr. Linus Salmons follows [facility] infectious disease control.  . Hep B w/o coma   . Decreased mobility     hx. Rt. hip "avascular necrosis" -unable to weight bear long periods, "using wheelchair"..   There were no vitals taken for this visit.  Opioid Risk Score:   Fall Risk Score: Low Fall Risk (0-5 points)  Review of Systems  All other systems reviewed and are negative.      Objective:   Physical Exam  Constitutional: She is oriented to person, place, and time. She appears well-developed and well-nourished.  Morbid Obese  HENT:  Head: Normocephalic and atraumatic.  Neck: Normal range of motion. Neck supple.  Cardiovascular: Normal rate and regular rhythm.   Pulmonary/Chest: Effort normal and breath sounds normal.  Musculoskeletal: She  exhibits edema.  Normal Muscle Bulk and Muscle Testing Reveals: Upper extremities: Full ROM and Muscle Strength 5/5 Lumbar Paraspinal Tenderness: L-3- L-5 Lower Extremities: Full ROM and Muscle Strength on the Right 5/5 and Left 4/5 Right Lower Extremity Flexion Produces pain into Right hip and Lower Back Left Lower Extremity Flexion Produces Pain into Lower Extremity and Patella Arrived in Motorized wheelchair   Neurological: She is alert and oriented to person, place, and time.  Skin: Skin is warm and dry.  Psychiatric: She has a normal mood and affect.  Nursing note and vitals reviewed.         Assessment & Plan:  1. Chronic right hip pain due to AVN with joint collapse: Continue oxycodone tid, Voltaren gel and  Robaxin prn.  Refilled: Oxycodone 5 mg #100--use one pill every 8 hours prn pain. May take an extra tablet when pain is sever.  2. Low back pain: Due to RLE leg length discrepancy.  Continue with current medication regime and heat therapy. 3. Balance deficits with poor endurance: Continue using her motorized wheelchair to increse her activity.   20 minutes of face to face patient care time was spent during this visit. All questions were encouraged and answered.   F/U in 1 month

## 2014-11-12 NOTE — Progress Notes (Signed)
MRN: [name] Name: [name]  Sex: female Age: 59 y.o. DOB: 12/27/1955  Allergies: Penicillins and Demerol  Chief Complaint  Patient presents with  . Follow-up    HPI: Patient is 59 y.o. female who has to of hypertension, chronic hep C, chronic pain syndrome currently following up with physical therapy, she also reported to have chronic diarrhea and is concerned if she has no mention level, she also has dry skin and several moles on the skin as per patient she is going to follow with her dermatologist, she also has completed the course of hepatitis C treatment and is following with ID. Patient also reported to have varicose veins sometimes they bother her, previous ultrasound reviewed she has history of symptomatic varicose veins but no DVT. Patient has never been evaluated by vascular surgery.patient is also requesting refill on antifungal cream which she used for the rash under her breast.  Past Medical History  Diagnosis Date  . Osteoporosis   . Depression   . Hypertension   . Anemia   . Obesity   . Chronic kidney disease   . Hypothyroid     mild- no medication  . Transfusion history     70's, 80's ? hepatitis C attributed to past transfusions  . Hep C w/o coma, chronic     07-20-14 being presently tx. "Harvoni"-good response per pt. will complete in 7days- Dr. Linus Salmons follows [employer] [facility] infectious disease control.  . Hep B w/o coma   . Decreased mobility     hx. Rt. hip "avascular necrosis" -unable to weight bear long periods, "using wheelchair"..    Past Surgical History  Procedure Laterality Date  . Abdominal hysterectomy    . Total knee arthroplasty    . Cholecystectomy        Medication List       This list is accurate as of: 11/12/14 12:58 PM.  Always use your most recent med list.               amLODipine 5 MG tablet  Commonly known as:  NORVASC  Take 1 tablet (5 mg total) by mouth daily.     diclofenac sodium 1 % Gel  Commonly known as:   VOLTAREN  Apply 2 g topically 4 (four) times daily.     hydrOXYzine 50 MG capsule  Commonly known as:  VISTARIL  Take 50 mg by mouth at bedtime as needed (to help sleep).     lisinopril 40 MG tablet  Commonly known as:  PRINIVIL,ZESTRIL  Take 1 tablet (40 mg total) by mouth daily.     methocarbamol 500 MG tablet  Commonly known as:  ROBAXIN  Take 1 tablet (500 mg total) by mouth 2 times daily at 12 noon and 4 pm.     miconazole 2 % cream  Commonly known as:  MICOTIN  Apply 1 application topically 2 (two) times daily.     omeprazole 20 MG capsule  Commonly known as:  PRILOSEC  Take 20 mg by mouth daily.     oxyCODONE 5 MG immediate release tablet  Commonly known as:  ROXICODONE  Take 1 tablet (5 mg total) by mouth every 8 (eight) hours as needed. May take an extra tablet when pain is sever     sertraline 100 MG tablet  Commonly known as:  ZOLOFT  Take 100 mg by mouth every morning.        Meds ordered this encounter  Medications  . amLODipine (NORVASC)  5 MG tablet    Sig: Take 1 tablet (5 mg total) by mouth daily.    Dispense:  90 tablet    Refill:  3  . lisinopril (PRINIVIL,ZESTRIL) 40 MG tablet    Sig: Take 1 tablet (40 mg total) by mouth daily.    Dispense:  90 tablet    Refill:  3  . miconazole (MICOTIN) 2 % cream    Sig: Apply 1 application topically 2 (two) times daily.    Dispense:  28.35 g    Refill:  0    Immunization History  Administered Date(s) Administered  . Hepatitis B, adult/adol-2 dose 05/08/2014, 06/06/2014  . Influenza,inj,Quad PF,36+ Mos 07/31/2013, 05/08/2014  . Pneumococcal Polysaccharide-23 06/06/2014    Family History  Problem Relation Age of Onset  . Cancer Mother   . Diabetes Mother   . Cancer Father     History  Substance Use Topics  . Smoking status: Current Every Day Smoker -- 0.50 packs/day    Types: Cigarettes  . Smokeless tobacco: Never Used  . Alcohol Use: No    Review of Systems   As noted in HPI  Filed  Vitals:   11/12/14 1032  BP: 111/74  Pulse: 81  Temp: 98 F (36.7 C)  Resp: 16    Physical Exam  Physical Exam  Constitutional:  Patient is in wheelchair    Eyes: EOM are normal. Pupils are equal, round, and reactive to light.  Cardiovascular: Normal rate and regular rhythm.   Pulmonary/Chest: Breath sounds normal. No respiratory distress. She has no wheezes. She has no rales.  Musculoskeletal:  Left leg varicose veins , no signs of cellulitis.  Skin:  dry skin several moles.    CBC    Component Value Date/Time   WBC 8.5 07/12/2014 1135   RBC 3.89 07/12/2014 1135   HGB 11.9* 07/12/2014 1135   HCT 35.2* 07/12/2014 1135   PLT 116* 07/12/2014 1135   MCV 90.5 07/12/2014 1135   LYMPHSABS 3.7 07/12/2014 1135   MONOABS 0.8 07/12/2014 1135   EOSABS 0.2 07/12/2014 1135   BASOSABS 0.0 07/12/2014 1135    CMP     Component Value Date/Time   NA 136 07/12/2014 1135   K 4.2 07/12/2014 1135   CL 105 07/12/2014 1135   CO2 24 07/12/2014 1135   GLUCOSE 109* 07/12/2014 1135   BUN 14 07/12/2014 1135   CREATININE 0.58 07/12/2014 1135   CREATININE 0.51 12/29/2012 1100   CALCIUM 9.1 07/12/2014 1135   PROT 7.8 07/12/2014 1135   ALBUMIN 3.3* 07/12/2014 1135   AST 28 07/12/2014 1135   ALT 14 07/12/2014 1135   ALKPHOS 106 07/12/2014 1135   BILITOT 0.5 07/12/2014 1135   GFRNONAA >89 07/12/2014 1135   GFRNONAA >90 12/29/2012 1100   GFRAA >89 07/12/2014 1135   GFRAA >90 12/29/2012 1100    Lab Results  Component Value Date/Time   CHOL 153 07/31/2013 03:16 PM    No components found for: HGA1C  Lab Results  Component Value Date/Time   AST 28 07/12/2014 11:35 AM    Assessment and Plan  Essential hypertension, benign - Plan:blood pressure is well controlled, continued current meds. amLODipine (NORVASC) 5 MG tablet, lisinopril (PRINIVIL,ZESTRIL) 40 MG tablet, COMPLETE METABOLIC PANEL WITH GFR  Varicose veins of left lower extremity - Plan: Ambulatory referral to Vascular  Surgery  Rash and nonspecific skin eruption - Plan: miconazole (MICOTIN) 2 % cream  Chronic diarrhea - Plan: currently patient is following with the GI and  is scheduled to get colonoscopy, will check Magnesium level   Chronic hepatitis C without hepatic coma Patient is completed the course of hep C treatment, following up with ID.  Health Maintenance  -Vaccinations:  Up-to-date with flu shot and Pneumovax.  Return in about 3 months (around 02/12/2015) for hypertension.   This note has been created with Surveyor, quantity. Any transcriptional errors are unintentional.    Lorayne Marek, MD

## 2014-11-12 NOTE — Progress Notes (Signed)
Patient here for follow up Patient is requesting her magnesium level check due to  Having really dry skin Patient also states she has thrombtisi to her left leg and requesting an antibiotic

## 2014-11-19 ENCOUNTER — Telehealth: Payer: Self-pay

## 2014-11-19 NOTE — Telephone Encounter (Signed)
-----   Message from Lorayne Marek, MD sent at 11/13/2014  2:51 PM EST ----- Call and let the patient know that her magnesium and electrolyte level is within normal range

## 2014-11-19 NOTE — Telephone Encounter (Signed)
Patient not available Left message on machine with her normal results

## 2014-11-20 ENCOUNTER — Ambulatory Visit (INDEPENDENT_AMBULATORY_CARE_PROVIDER_SITE_OTHER): Payer: Medicare Other | Admitting: Internal Medicine

## 2014-11-20 ENCOUNTER — Encounter: Payer: Self-pay | Admitting: Internal Medicine

## 2014-11-20 VITALS — BP 123/78 | HR 79 | Temp 98.4°F | Ht 66.0 in | Wt 330.0 lb

## 2014-11-20 DIAGNOSIS — K7460 Unspecified cirrhosis of liver: Secondary | ICD-10-CM | POA: Diagnosis not present

## 2014-11-20 DIAGNOSIS — B182 Chronic viral hepatitis C: Secondary | ICD-10-CM

## 2014-11-20 DIAGNOSIS — Z23 Encounter for immunization: Secondary | ICD-10-CM | POA: Diagnosis not present

## 2014-11-20 NOTE — Assessment & Plan Note (Signed)
She is doing well and SVR 12 undetectable. We will get SVR 24 as well since she has cirrhosis that I doubt it would relapse at that time.

## 2014-11-20 NOTE — Progress Notes (Signed)
   Subjective:    Patient ID: [name], female    DOB: 01/20/1956, 59 y.o.   MRN: [name]  HPI Here for follow-up of hepatitis C now having completed Harvoni re-months ago.  She has done well and post treatment vl is undetectable.  SVR 12 undetectable. No new issues.  Pleased to be finished.  EGD has been done.  To get screening colonoscopy when she schedules.      Review of Systems  Constitutional: Negative for fatigue.  Gastrointestinal: Negative for nausea.  Skin: Negative for rash.  Neurological: Negative for dizziness and headaches.       Objective:   Physical Exam  Constitutional: She appears well-developed and well-nourished. No distress.  Eyes: No scleral icterus.  Cardiovascular: Normal rate, regular rhythm and normal heart sounds.   No murmur heard. Pulmonary/Chest: Effort normal and breath sounds normal. No respiratory distress.  Lymphadenopathy:    She has no cervical adenopathy.  Skin: No rash noted.          Assessment & Plan:

## 2014-11-20 NOTE — Addendum Note (Signed)
Addended by: Myrtis Hopping A on: 11/20/2014 04:35 PM   Modules accepted: Orders

## 2014-11-20 NOTE — Assessment & Plan Note (Signed)
Her CAT scan did show cirrhosis. I will do a screening ultrasound prior to her next visit and every 6 months.

## 2014-11-23 ENCOUNTER — Other Ambulatory Visit: Payer: Self-pay

## 2014-11-23 DIAGNOSIS — I8392 Asymptomatic varicose veins of left lower extremity: Secondary | ICD-10-CM

## 2014-11-27 ENCOUNTER — Telehealth: Payer: Self-pay | Admitting: Gastroenterology

## 2014-12-10 ENCOUNTER — Telehealth: Payer: Self-pay | Admitting: Gastroenterology

## 2014-12-10 NOTE — Telephone Encounter (Signed)
Provided she promises not to cancel

## 2014-12-10 NOTE — Telephone Encounter (Signed)
She needs a screening colonoscopy. She had come a day early to the [employer] this past fall. She did not want to remain on liquids and ended up cancelling her colonoscopy.  Okay to schedule her for a direct?

## 2014-12-10 NOTE — Telephone Encounter (Signed)
Spoke with patient. She states no heart disease, no lung disease, no blood thinners. Appointments scheduled.

## 2014-12-11 ENCOUNTER — Other Ambulatory Visit: Payer: Self-pay | Admitting: *Deleted

## 2014-12-11 DIAGNOSIS — Z1211 Encounter for screening for malignant neoplasm of colon: Secondary | ICD-10-CM

## 2014-12-11 NOTE — Telephone Encounter (Signed)
PROCEDURE WAS SCHEDULED IN OUR ENDOSCOPY UNIT. HAD TO RESCHEDULE PT FOR [facility] DUE TO BMI BEING OVER 50  PT AWARE TO KEEP PREVISIT AS SCHEDULED

## 2014-12-17 ENCOUNTER — Ambulatory Visit (HOSPITAL_BASED_OUTPATIENT_CLINIC_OR_DEPARTMENT_OTHER): Payer: Medicare Other | Admitting: Physical Medicine & Rehabilitation

## 2014-12-17 ENCOUNTER — Encounter: Payer: Medicare Other | Attending: Physical Medicine and Rehabilitation

## 2014-12-17 ENCOUNTER — Encounter: Payer: Self-pay | Admitting: Physical Medicine & Rehabilitation

## 2014-12-17 VITALS — BP 94/69 | HR 91 | Resp 14

## 2014-12-17 DIAGNOSIS — M2170 Unequal limb length (acquired), unspecified site: Secondary | ICD-10-CM | POA: Diagnosis not present

## 2014-12-17 DIAGNOSIS — G894 Chronic pain syndrome: Secondary | ICD-10-CM | POA: Diagnosis not present

## 2014-12-17 DIAGNOSIS — M25552 Pain in left hip: Secondary | ICD-10-CM | POA: Diagnosis not present

## 2014-12-17 DIAGNOSIS — M25551 Pain in right hip: Secondary | ICD-10-CM

## 2014-12-17 MED ORDER — OXYCODONE HCL 5 MG PO TABS: 5.0000 mg | ORAL_TABLET | Freq: Three times a day (TID) | ORAL | Status: DC | PRN

## 2014-12-17 NOTE — Patient Instructions (Signed)
Theraband exercises, wrap around doorknob and pull like you are rowing.

## 2014-12-17 NOTE — Progress Notes (Signed)
Subjective:    Patient ID: [name], female    DOB: 05-May-1956, 59 y.o.   MRN: [name]  HPI 59 year old female with history of avascular necrosis. Nonoperative because of difficulty anchoring both the femoral and acetabular component. R TKR hx  Has received a power chair since I last saw her in August 2015. This is helping with her mobility, she is getting less exercise and having some increased swelling in the legs. She plans to see vascular surgery for evaluation and a vein study. Leg length discrepancy related to hip  Still uses a walker at home for mobility but outside the house is always in her power chair. Having some soreness in the shoulders. Feels weaker In the arms Had inflammed R subacromial bursa Pain Inventory Average Pain 7 Pain Right Now 7 My pain is sharp, burning and aching  In the last 24 hours, has pain interfered with the following? General activity 8 Relation with others 7 Enjoyment of life 8 What TIME of day is your pain at its worst? morning Sleep (in general) Fair  Pain is worse with: walking, bending, inactivity and standing Pain improves with: rest and medication Relief from Meds: 7  Mobility walk with assistance use a walker how many minutes can you walk? 2 ability to climb steps?  no do you drive?  no use a wheelchair Do you have any goals in this area?  yes  Function disabled: date disabled .  Neuro/Psych bowel control problems depression  Prior Studies Any changes since last visit?  no  Physicians involved in your care Any changes since last visit?  no   Family History  Problem Relation Age of Onset  . Cancer Mother   . Diabetes Mother   . Cancer Father    History   Social History  . Marital Status: Divorced    Spouse Name: N/A  . Number of Children: N/A  . Years of Education: N/A   Social History Main Topics  . Smoking status: Current Every Day Smoker -- 0.50 packs/day    Types: Cigarettes  . Smokeless  tobacco: Never Used     Comment: trying to cut back  . Alcohol Use: No  . Drug Use: No  . Sexual Activity: Not on file   Other Topics Concern  . None   Social History Narrative   Past Surgical History  Procedure Laterality Date  . Abdominal hysterectomy    . Total knee arthroplasty    . Cholecystectomy     Past Medical History  Diagnosis Date  . Osteoporosis   . Depression   . Hypertension   . Anemia   . Obesity   . Chronic kidney disease   . Hypothyroid     mild- no medication  . Transfusion history     70's, 80's ? hepatitis C attributed to past transfusions  . Hep C w/o coma, chronic     07-20-14 being presently tx. "Harvoni"-good response per pt. will complete in 7days- Dr. Linus Salmons follows [place] [facility] disease control.  . Hep B w/o coma   . Decreased mobility     hx. Rt. hip "avascular necrosis" -unable to weight bear long periods, "using wheelchair"..   BP 94/69 mmHg  Pulse 91  Resp 14  SpO2 98%  Opioid Risk Score:   Fall Risk Score: Moderate Fall Risk (6-13 points) (patient previously educated)`1  Depression screen [facility] 2/9  Depression screen [facility] 2/9 12/17/2014 11/20/2014 07/12/2014 07/12/2014 06/06/2014 05/08/2014  Decreased Interest 2  1 0 0 0 0  Down, Depressed, Hopeless 2 1 0 0 0 0  PHQ - 2 Score 4 2 0 0 0 0  Altered sleeping 1 1 - - - -  Tired, decreased energy 2 1 - - - -  Change in appetite 2 0 - - - -  Feeling bad or failure about yourself  1 1 - - - -  Trouble concentrating 1 0 - - - -  Moving slowly or fidgety/restless 0 0 - - - -  Suicidal thoughts 0 0 - - - -  PHQ-9 Score 11 5 - - - -     Review of Systems  Gastrointestinal:       Bowel control problems, sudden onset  Psychiatric/Behavioral: Positive for dysphoric mood.       Objective:   Physical Exam  Constitutional: She is oriented to person, place, and time.  obese  Neurological: She is alert and oriented to person, place, and time.  Psychiatric: She has a normal mood and  affect.  Nursing note and vitals reviewed.   Motor strength is 5/5 bilateral deltoid, biceps, triceps, grip 2 minus right hip flexor 3 minus knee extensor 4 minus ankle dorsiflexor plantar flexor 5/5 in the left hip flexor and extensor ankle dorsiflexor  Sensation reduced in the right knee as well as right ankle and dorsum of the foot. There is bilateral swelling at the ankles. Varicose veins left greater than right leg  Mood and affect are appropriate      Assessment & Plan:  1. Chronic pain syndrome, multifactorial, mainly related to right hip contracture and pain secondary to severe nonoperative AVN. In addition she is starting to develop left hip pain, last imaging study of the left hip looked relatively normal about 3 years ago. We will repeat PA pelvis.  2. Weakness and some pain in bilateral shoulders, mainly due to deconditioning. Instructed theraband exercises for rowing exercise

## 2015-01-02 DIAGNOSIS — F329 Major depressive disorder, single episode, unspecified: Secondary | ICD-10-CM | POA: Diagnosis not present

## 2015-01-10 ENCOUNTER — Encounter: Payer: Self-pay | Admitting: Vascular Surgery

## 2015-01-11 ENCOUNTER — Ambulatory Visit (HOSPITAL_COMMUNITY)
Admission: RE | Admit: 2015-01-11 | Discharge: 2015-01-11 | Disposition: A | Discharge status: Home / Self Care | Payer: Medicare Other | Source: Ambulatory Visit | Attending: Vascular Surgery | Admitting: Vascular Surgery

## 2015-01-11 ENCOUNTER — Ambulatory Visit (INDEPENDENT_AMBULATORY_CARE_PROVIDER_SITE_OTHER): Payer: Medicare Other | Admitting: Vascular Surgery

## 2015-01-11 ENCOUNTER — Encounter: Payer: Self-pay | Admitting: Vascular Surgery

## 2015-01-11 VITALS — BP 124/74 | HR 88 | Ht 66.0 in | Wt 330.0 lb

## 2015-01-11 DIAGNOSIS — I872 Venous insufficiency (chronic) (peripheral): Secondary | ICD-10-CM | POA: Insufficient documentation

## 2015-01-11 DIAGNOSIS — I8392 Asymptomatic varicose veins of left lower extremity: Secondary | ICD-10-CM | POA: Insufficient documentation

## 2015-01-11 DIAGNOSIS — I83893 Varicose veins of bilateral lower extremities with other complications: Secondary | ICD-10-CM | POA: Diagnosis not present

## 2015-01-11 LAB — VAS US LOWER EXTREMITY VENOUS REFLUX: LEFT SFV DIAMETER: 0.6 mm

## 2015-01-11 NOTE — Progress Notes (Signed)
Referred by:  Lorayne Marek, MD [address] [place], [place] [address]  Reason for referral: Swollen left > right leg  History of Present Illness  [name] is a 59 y.o. (10-02-1955) female who presents with chief complaint: swollen left > right leg.  Patient notes, onset of swelling for years now, not associated with any trigger.  The patient notes she has had varicose veins and spider veins since puberty.  The patient's symptoms include: bleeding from varicosities.  Over the last month, she developed severe swelling in her left leg with 3+ pitting edema.  .  The patient has had no history of DVT, known history of pregnancy, known history of varicose vein, no history of venous stasis ulcers, no history of  Lymphedema and known history of skin changes in lower legs.  There is known family history of venous disorders.  The patient has never used compression stockings in the past due AVN in right hip.  She is unable to bend enough to get stockings on.  Past Medical History  Diagnosis Date  . Osteoporosis   . Depression   . Hypertension   . Anemia   . Obesity   . Chronic kidney disease   . Hypothyroid     mild- no medication  . Transfusion history     70's, 80's ? hepatitis C attributed to past transfusions  . Hep C w/o coma, chronic     07-20-14 being presently tx. "Harvoni"-good response per pt. will complete in 7days- Dr. Linus Salmons follows [facility].  . Hep B w/o coma   . Decreased mobility     hx. Rt. hip "avascular necrosis" -unable to weight bear long periods, "using wheelchair"..    Past Surgical History  Procedure Laterality Date  . Abdominal hysterectomy    . Total knee arthroplasty    . Cholecystectomy      History   Social History  . Marital Status: Divorced    Spouse Name: N/A  . Number of Children: N/A  . Years of Education: N/A   Occupational History  . Not on file.   Social History Main Topics  . Smoking status:  Current Every Day Smoker -- 0.50 packs/day for 20 years    Types: Cigarettes  . Smokeless tobacco: Never Used     Comment: trying to cut back  . Alcohol Use: No  . Drug Use: No  . Sexual Activity: Not on file   Other Topics Concern  . Not on file   Social History Narrative    Family History  Problem Relation Age of Onset  . Cancer Mother   . Diabetes Mother   . Heart disease Mother   . Hypertension Mother   . Cancer Father   . Hypertension Sister   . Hypertension Brother   . Diabetes Brother     Current Outpatient Prescriptions on File Prior to Visit  Medication Sig Dispense Refill  . amLODipine (NORVASC) 5 MG tablet Take 1 tablet (5 mg total) by mouth daily. 90 tablet 3  . diclofenac sodium (VOLTAREN) 1 % GEL Apply 2 g topically 4 (four) times daily. 2 Tube 2  . hydrOXYzine (VISTARIL) 50 MG capsule Take 50 mg by mouth at bedtime as needed (to help sleep).    Marland Kitchen lisinopril (PRINIVIL,ZESTRIL) 40 MG tablet Take 1 tablet (40 mg total) by mouth daily. 90 tablet 3  . methocarbamol (ROBAXIN) 500 MG tablet Take 1 tablet (500 mg total) by mouth 2  times daily at 12 noon and 4 pm. 60 tablet 2  . miconazole (MICOTIN) 2 % cream Apply 1 application topically 2 (two) times daily. 28.35 g 0  . omeprazole (PRILOSEC) 20 MG capsule Take 20 mg by mouth daily.    Marland Kitchen oxyCODONE (ROXICODONE) 5 MG immediate release tablet Take 1 tablet (5 mg total) by mouth every 8 (eight) hours as needed. May take an extra tablet when pain is sever 100 tablet 0  . sertraline (ZOLOFT) 100 MG tablet Take 100 mg by mouth every morning.     . [DISCONTINUED] DULoxetine (CYMBALTA) 20 MG capsule Take 60 mg by mouth daily.     No current facility-administered medications on file prior to visit.    Allergies  Allergen Reactions  . Penicillins Anaphylaxis  . Demerol Nausea And Vomiting    REVIEW OF SYSTEMS:  (Positives checked otherwise negative)  CARDIOVASCULAR:  []  chest pain, []  chest pressure, []  palpitations, []   shortness of breath when laying flat, []  shortness of breath with exertion,  [x]  pain in feet when walking, [x]  pain in feet when laying flat, []  history of blood clot in veins (DVT), []  history of phlebitis, [x]  swelling in legs, [x]  varicose veins  PULMONARY:  []  productive cough, []  asthma, []  wheezing  NEUROLOGIC:  []  weakness in arms or legs, []  numbness in arms or legs, []  difficulty speaking or slurred speech, []  temporary loss of vision in one eye, []  dizziness  HEMATOLOGIC:  []  bleeding problems, []  problems with blood clotting too easily  MUSCULOSKEL:  []  joint pain, []  joint swelling  GASTROINTEST:  []  vomiting blood, []  blood in stool     GENITOURINARY:  []  burning with urination, []  blood in urine  PSYCHIATRIC:  []  history of major depression  INTEGUMENTARY:  []  rashes, []  ulcers  CONSTITUTIONAL:  []  fever, []  chills   Physical Examination Filed Vitals:   01/11/15 1457  BP: 124/74  Pulse: 88  Height: 5\' 6"  (1.676 m)  Weight: 330 lb (149.687 kg)  SpO2: 96%   Body mass index is 53.29 kg/(m^2).  General: A&O x 3, WD, morbidly obese, wheelchair bound  Head: Richardson/AT  Ear/Nose/Throat: Hearing grossly intact, nares w/o erythema or drainage, oropharynx w/o Erythema/Exudate  Eyes: PERRLA, EOMI  Neck: Supple, no nuchal rigidity, no palpable LAD  Pulmonary: Sym exp, good air movt, CTAB, no rales, rhonchi, & wheezing  Cardiac: RRR, Nl S1, S2, no Murmurs, rubs or gallops  Vascular: Vessel Right Left  Radial Palpable Palpable  Brachial Palpable Palpable  Carotid Palpable, without bruit Palpable, without bruit  Aorta Not palpable due to obesity N/A  Femoral Not Palpable due to pannus overlying groin Not Palpable due pannus overlying groin  Popliteal Not palpable Not palpable  PT Not Palpable Not Palpable  DP Faintly Palpable Faintly Palpable   Gastrointestinal: soft, NTND, -G/R, - HSM, - masses, - CVAT B  Musculoskeletal: M/S 5/5 BUE, DF/PF 5/5, unable to full  test BLE due R hip AVN, gait cannot be tested, Extremities without ischemic changes , B foot cyanosis, spider veins and varicose veins evident, areas suggestive of thrombosed varicosities bilaterally evident, BLE 1+ edema, lipedema fat distribution  Neurologic: CN 2-12 intact , Pain and light touch intact in extremities , Motor exam as listed above  Psychiatric: Judgment intact, Mood & affect appropriate for pt's clinical situation  Dermatologic: See M/S exam for extremity exam, no rashes otherwise noted  Lymph : No Cervical, Axillary, or Inguinal lymphadenopathy   Non-Invasive Vascular Imaging  LLE Venous Insufficiency Duplex (Date: 01/11/2015):   No DVT   + SVT in distal L GSV?  + GSV and ASV reflux along with SFJ reflux,   + deep venous reflux: CFV  Outside Studies/Documentation 4 pages of outside documents were reviewed including: outpatient PCP chart.  Medical Decision Making  [name] is a 59 y.o. female who presents with: BLE chronic venous insufficiency (C4), AVN of R hip   Doubt this patient will every be able to put on compression stockings due her functional impairment from the R hip AVN  It's not clear to me whether the mid-thigh SVT has already occluded the reflux from the GSV.  It appears the ASV reflux is feeding some lateral varicosities in the L thigh.  Will see if patient is a candidate for evaluation in the Vein Clinic.  Unclear to me if this patient is going to be a candidate for a stab phlebectomy of residual L GSV and ASV.    Will defer to Dr. Colletta Maryland judgment.  Thank you for allowing [place] to participate in this patient's care.  Adele Barthel, MD [facility] Office: [phone] Pager: [phone]  01/11/2015, 3:58 PM

## 2015-01-15 ENCOUNTER — Encounter: Payer: Self-pay | Admitting: Registered Nurse

## 2015-01-15 ENCOUNTER — Encounter: Payer: Medicare Other | Attending: Physical Medicine and Rehabilitation | Admitting: Registered Nurse

## 2015-01-15 VITALS — BP 138/74 | HR 92 | Resp 14

## 2015-01-15 DIAGNOSIS — G894 Chronic pain syndrome: Secondary | ICD-10-CM | POA: Diagnosis not present

## 2015-01-15 DIAGNOSIS — M25552 Pain in left hip: Secondary | ICD-10-CM | POA: Diagnosis not present

## 2015-01-15 DIAGNOSIS — M8700 Idiopathic aseptic necrosis of unspecified bone: Secondary | ICD-10-CM

## 2015-01-15 DIAGNOSIS — M879 Osteonecrosis, unspecified: Secondary | ICD-10-CM | POA: Diagnosis not present

## 2015-01-15 DIAGNOSIS — Z5181 Encounter for therapeutic drug level monitoring: Secondary | ICD-10-CM

## 2015-01-15 DIAGNOSIS — Z79899 Other long term (current) drug therapy: Secondary | ICD-10-CM

## 2015-01-15 DIAGNOSIS — M25551 Pain in right hip: Secondary | ICD-10-CM

## 2015-01-15 DIAGNOSIS — M2170 Unequal limb length (acquired), unspecified site: Secondary | ICD-10-CM

## 2015-01-15 DIAGNOSIS — M24551 Contracture, right hip: Secondary | ICD-10-CM

## 2015-01-15 MED ORDER — OXYCODONE HCL 5 MG PO TABS: 5.0000 mg | ORAL_TABLET | Freq: Three times a day (TID) | ORAL | Status: DC | PRN

## 2015-01-15 NOTE — Progress Notes (Signed)
Subjective:    Patient ID: [name], female    DOB: 1956-05-10, 59 y.o.   MRN: [id number]  HPI:[name] is a 59 year old female who returns for follow up for chronic pain and medication refill. She says her pain is located in her lower back,left groin, left hip and bilateral lower extremities. She rates her pain 6. Her current exercise regime is performing stretching and chair exercises she's walking with walker in her home. She's being followed by Dr. Bridgett Larsson( [facility]) for chronic venous insufficiency and varicose veins.   She's emotional today her best friend passed away two weeks ago, emotional support given.  Pain Inventory Average Pain 8 Pain Right Now 6 My pain is sharp, burning, stabbing and aching  In the last 24 hours, has pain interfered with the following? General activity 8 Relation with others 6 Enjoyment of life 8 What TIME of day is your pain at its worst? morning Sleep (in general) Poor  Pain is worse with: walking, bending, inactivity and standing Pain improves with: rest, medication and injections Relief from Meds: 7  Mobility use a wheelchair Do you have any goals in this area?  yes  Function disabled: date disabled .  Neuro/Psych No problems in this area  Prior Studies Any changes since last visit?  yes vascular study  Physicians involved in your care Any changes since last visit?  no   Family History  Problem Relation Age of Onset  . Cancer Mother   . Diabetes Mother   . Heart disease Mother   . Hypertension Mother   . Cancer Father   . Hypertension Sister   . Hypertension Brother   . Diabetes Brother    History   Social History  . Marital Status: Divorced    Spouse Name: N/A  . Number of Children: N/A  . Years of Education: N/A   Social History Main Topics  . Smoking status: Current Every Day Smoker -- 0.50 packs/day for 20 years    Types: Cigarettes  . Smokeless tobacco: Never Used     Comment:  trying to cut back  . Alcohol Use: No  . Drug Use: No  . Sexual Activity: Not on file   Other Topics Concern  . None   Social History Narrative   Past Surgical History  Procedure Laterality Date  . Abdominal hysterectomy    . Total knee arthroplasty    . Cholecystectomy     Past Medical History  Diagnosis Date  . Osteoporosis   . Depression   . Hypertension   . Anemia   . Obesity   . Chronic kidney disease   . Hypothyroid     mild- no medication  . Transfusion history     70's, 80's ? hepatitis C attributed to past transfusions  . Hep C w/o coma, chronic     07-20-14 being presently tx. "Harvoni"-good response per pt. will complete in 7days- Dr. Linus Salmons follows [facility].  . Hep B w/o coma   . Decreased mobility     hx. Rt. hip "avascular necrosis" -unable to weight bear long periods, "using wheelchair"..   BP 138/74 mmHg  Pulse 92  Resp 14  SpO2 96%  Opioid Risk Score:   Fall Risk Score: Moderate Fall Risk (6-13 points)`1  Depression screen PHQ 2/9  Depression screen [facility] 2/9 12/17/2014 11/20/2014 07/12/2014 07/12/2014 06/06/2014 05/08/2014  Decreased Interest 2 1 0 0 0 0  Down,  Depressed, Hopeless 2 1 0 0 0 0  PHQ - 2 Score 4 2 0 0 0 0  Altered sleeping 1 1 - - - -  Tired, decreased energy 2 1 - - - -  Change in appetite 2 0 - - - -  Feeling bad or failure about yourself  1 1 - - - -  Trouble concentrating 1 0 - - - -  Moving slowly or fidgety/restless 0 0 - - - -  Suicidal thoughts 0 0 - - - -  PHQ-9 Score 11 5 - - - -     Review of Systems  All other systems reviewed and are negative.      Objective:   Physical Exam  Constitutional: She is oriented to person, place, and time. She appears well-developed and well-nourished.  HENT:  Head: Normocephalic and atraumatic.  Neck: Normal range of motion. Neck supple.  Cardiovascular: Normal rate and regular rhythm.   Pulmonary/Chest: Effort normal and breath sounds normal.    Musculoskeletal:  Normal Muscle Bulk and Muscle Testing Reveals: Upper Extremities: Full ROM and Muscle Strength Right 5/5 and Left 4/5 Lumbar Paraspinal Tenderness: L-3- L-5 Bilateral Greater Trochanteric Tenderness Lower Extremities: Right: Decreased ROM and Muscle Strength 4/5 Right Lower Extremity Flexion Produces Pain into Hip and Patella Left: Full ROM and Muscle Strength 5/5 Left Flexion Produces pain into hip and patella Arrived in motorized wheelchair  Neurological: She is alert and oriented to person, place, and time.  Skin: Skin is warm and dry.  Psychiatric: She has a normal mood and affect.  Nursing note and vitals reviewed.         Assessment & Plan:  1. Chronic right hip pain due to AVN with joint collapse: Continue oxycodone tid, Voltaren gel and Robaxin prn.  Refilled: Oxycodone 5 mg #100--use one pill every 8 hours prn pain. May take an extra tablet when pain is sever.  2. Low back pain: Due to RLE leg length discrepancy.  Continue with current medication regime and heat therapy. 3. Balance deficits with poor endurance: Continue using her motorized wheelchair to increse her activity.  4. Bilateral Hip Pain: Continue with heat and exercise and voltaren gel regimen.  20 minutes of face to face patient care time was spent during this visit. All questions were encouraged and answered.   F/U in 1 month

## 2015-01-17 ENCOUNTER — Encounter: Payer: Medicare Other | Admitting: Gastroenterology

## 2015-02-11 ENCOUNTER — Ambulatory Visit: Payer: Medicare Other | Admitting: Registered Nurse

## 2015-02-11 ENCOUNTER — Encounter: Payer: Self-pay | Admitting: Vascular Surgery

## 2015-02-12 ENCOUNTER — Encounter: Payer: Self-pay | Admitting: Vascular Surgery

## 2015-02-12 ENCOUNTER — Ambulatory Visit (INDEPENDENT_AMBULATORY_CARE_PROVIDER_SITE_OTHER): Payer: Medicare Other | Admitting: Vascular Surgery

## 2015-02-12 VITALS — BP 130/80 | HR 84 | Resp 18 | Ht 66.0 in | Wt 330.0 lb

## 2015-02-12 DIAGNOSIS — I83893 Varicose veins of bilateral lower extremities with other complications: Secondary | ICD-10-CM | POA: Diagnosis not present

## 2015-02-12 NOTE — Progress Notes (Signed)
Here today for follow-up of her venous pathology. She had seen Dr. Bridgett Larsson on 01/11/2015. She was found to have superficial thrombophlebitis in her left proximal thigh veins. He reports continued bilateral lower extremity swelling. She reports this occurs in her thighs and down into her calves and feet. She is morbidly obese weighing 330 pounds. She is in a motorized wheelchair. Worsening she has no venous ulceration.  Past Medical History  Diagnosis Date  . Osteoporosis   . Depression   . Hypertension   . Anemia   . Obesity   . Chronic kidney disease   . Hypothyroid     mild- no medication  . Transfusion history     70's, 80's ? hepatitis C attributed to past transfusions  . Hep C w/o coma, chronic     07-20-14 being presently tx. "Harvoni"-good response per pt. will complete in 7days- Dr. Linus Salmons follows [facility].  . Hep B w/o coma   . Decreased mobility     hx. Rt. hip "avascular necrosis" -unable to weight bear long periods, "using wheelchair"..  . Superficial thrombophlebitis     left leg    History  Substance Use Topics  . Smoking status: Current Every Day Smoker -- 0.50 packs/day for 20 years    Types: Cigarettes  . Smokeless tobacco: Never Used     Comment: trying to cut back  . Alcohol Use: No    Family History  Problem Relation Age of Onset  . Cancer Mother   . Diabetes Mother   . Heart disease Mother   . Hypertension Mother   . Cancer Father   . Hypertension Sister   . Hypertension Brother   . Diabetes Brother     Allergies  Allergen Reactions  . Penicillins Anaphylaxis  . Demerol Nausea And Vomiting     Current outpatient prescriptions:  .  amLODipine (NORVASC) 5 MG tablet, Take 1 tablet (5 mg total) by mouth daily., Disp: 90 tablet, Rfl: 3 .  diclofenac sodium (VOLTAREN) 1 % GEL, Apply 2 g topically 4 (four) times daily. (Patient taking differently: Apply 2 g topically 4 (four) times daily as needed (pain). ), Disp: 2 Tube,  Rfl: 2 .  hydrOXYzine (VISTARIL) 50 MG capsule, Take 50 mg by mouth at bedtime as needed (to help sleep)., Disp: , Rfl:  .  ibuprofen (ADVIL,MOTRIN) 200 MG tablet, Take 400 mg by mouth every 6 (six) hours as needed for moderate pain., Disp: , Rfl:  .  lisinopril (PRINIVIL,ZESTRIL) 40 MG tablet, Take 1 tablet (40 mg total) by mouth daily., Disp: 90 tablet, Rfl: 3 .  methocarbamol (ROBAXIN) 500 MG tablet, Take 1 tablet (500 mg total) by mouth 2 times daily at 12 noon and 4 pm. (Patient taking differently: Take 500 mg by mouth 2 (two) times daily as needed for muscle spasms. ), Disp: 60 tablet, Rfl: 2 .  miconazole (MICOTIN) 2 % cream, Apply 1 application topically 2 (two) times daily., Disp: 28.35 g, Rfl: 0 .  oxyCODONE (ROXICODONE) 5 MG immediate release tablet, Take 1 tablet (5 mg total) by mouth every 8 (eight) hours as needed. May take an extra tablet when pain is sever, Disp: 100 tablet, Rfl: 0 .  ranitidine (ZANTAC) 150 MG capsule, Take 150 mg by mouth daily., Disp: , Rfl:  .  sertraline (ZOLOFT) 100 MG tablet, Take 100 mg by mouth every morning. , Disp: , Rfl:  .  omeprazole (PRILOSEC) 20 MG capsule, Take 20 mg by mouth  daily as needed (acid reflux). , Disp: , Rfl:  .  [DISCONTINUED] DULoxetine (CYMBALTA) 20 MG capsule, Take 60 mg by mouth daily., Disp: , Rfl:   Filed Vitals:   02/12/15 0948  BP: 130/80  Pulse: 84  Resp: 18  Height: 5\' 6"  (1.676 m)  Weight: 330 lb (149.687 kg)    Body mass index is 53.29 kg/(m^2).       On physical exam she does have some thickness but no significant erythema in her medial thighs bilaterally. No venous ulceration. Does have pitting edema bilaterally. I reimage these areas in her thigh with a SonoSite. This does show superficial thrombophlebitis and tributary veins.  I discussed the findings of her formal duplex from 01/11/2015 and also when I see today. It turns out I did ask to see this patient for similar Rob comes in 1999. I did explain that  there is no real specific treatment options other than elevation which is very difficult due to her size and compression which she has not been able to tolerate in the past. I did explain that this is not put her any significant risk for DVT or pulmonary embolus. She is also concerned about stroke and I spent this should put her at no increased risk for stroke. She will elevated when possible. She does take Advil for discomfort. Will see [place] again on an as-needed basis

## 2015-02-13 ENCOUNTER — Ambulatory Visit (AMBULATORY_SURGERY_CENTER): Payer: Self-pay

## 2015-02-13 VITALS — Ht 66.0 in | Wt 333.0 lb

## 2015-02-13 DIAGNOSIS — Z1211 Encounter for screening for malignant neoplasm of colon: Secondary | ICD-10-CM

## 2015-02-13 DIAGNOSIS — F329 Major depressive disorder, single episode, unspecified: Secondary | ICD-10-CM | POA: Diagnosis not present

## 2015-02-13 MED ORDER — SUPREP BOWEL PREP KIT 17.5-3.13-1.6 GM/177ML PO SOLN: 1.0000 | Freq: Once | ORAL | Status: DC

## 2015-02-13 NOTE — Progress Notes (Signed)
No allergies to eggs or soy No diet/weight loss meds No home oxygen No past problems with anesthesia (except PONV after general anesthesia)  Has email  Emmi instructions given for colonoscopy

## 2015-02-18 ENCOUNTER — Encounter (HOSPITAL_COMMUNITY): Payer: Self-pay | Admitting: *Deleted

## 2015-02-25 NOTE — Anesthesia Preprocedure Evaluation (Addendum)
Anesthesia Evaluation  Patient identified by MRN, date of birth, ID band Patient awake    Reviewed: Allergy & Precautions, NPO status , Patient's Chart, lab work & pertinent test results  History of Anesthesia Complications (+) PONV and history of anesthetic complications  Airway Mallampati: III  TM Distance: >3 FB Neck ROM: Full    Dental no notable dental hx. (+) Dental Advisory Given, Edentulous Upper, Edentulous Lower   Pulmonary Current Smoker,  breath sounds clear to auscultation  Pulmonary exam normal       Cardiovascular hypertension, + Peripheral Vascular Disease Normal cardiovascular examRhythm:Regular Rate:Normal     Neuro/Psych PSYCHIATRIC DISORDERS Depression negative neurological ROS     GI/Hepatic negative GI ROS, (+) Hepatitis -, C  Endo/Other  Hypothyroidism Morbid obesity  Renal/GU Renal disease  negative genitourinary   Musculoskeletal negative musculoskeletal ROS (+)   Abdominal (+) + obese,   Peds negative pediatric ROS (+)  Hematology negative hematology ROS (+)   Anesthesia Other Findings   Reproductive/Obstetrics negative OB ROS                           Anesthesia Physical Anesthesia Plan  ASA: III  Anesthesia Plan: MAC   Post-op Pain Management:    Induction: Intravenous  Airway Management Planned:   Additional Equipment:   Intra-op Plan:   Post-operative Plan: Extubation in OR  Informed Consent: I have reviewed the patients History and Physical, chart, labs and discussed the procedure including the risks, benefits and alternatives for the proposed anesthesia with the patient or authorized representative who has indicated his/her understanding and acceptance.   Dental advisory given  Plan Discussed with: CRNA  Anesthesia Plan Comments:        Anesthesia Quick Evaluation

## 2015-02-26 ENCOUNTER — Ambulatory Visit (HOSPITAL_COMMUNITY)
Admission: RE | Admit: 2015-02-26 | Discharge: 2015-02-26 | Disposition: A | Discharge status: Home / Self Care | Payer: Medicare Other | Source: Ambulatory Visit | Attending: Gastroenterology | Admitting: Gastroenterology

## 2015-02-26 ENCOUNTER — Ambulatory Visit (HOSPITAL_COMMUNITY): Payer: Medicare Other | Admitting: Anesthesiology

## 2015-02-26 ENCOUNTER — Encounter (HOSPITAL_COMMUNITY): Payer: Self-pay

## 2015-02-26 ENCOUNTER — Encounter (HOSPITAL_COMMUNITY)
Admission: RE | Disposition: A | Discharge status: Home / Self Care | Payer: Self-pay | Source: Ambulatory Visit | Attending: Gastroenterology

## 2015-02-26 DIAGNOSIS — E039 Hypothyroidism, unspecified: Secondary | ICD-10-CM | POA: Diagnosis not present

## 2015-02-26 DIAGNOSIS — M810 Age-related osteoporosis without current pathological fracture: Secondary | ICD-10-CM | POA: Diagnosis not present

## 2015-02-26 DIAGNOSIS — K635 Polyp of colon: Secondary | ICD-10-CM | POA: Diagnosis not present

## 2015-02-26 DIAGNOSIS — N189 Chronic kidney disease, unspecified: Secondary | ICD-10-CM | POA: Insufficient documentation

## 2015-02-26 DIAGNOSIS — I739 Peripheral vascular disease, unspecified: Secondary | ICD-10-CM | POA: Insufficient documentation

## 2015-02-26 DIAGNOSIS — I129 Hypertensive chronic kidney disease with stage 1 through stage 4 chronic kidney disease, or unspecified chronic kidney disease: Secondary | ICD-10-CM | POA: Diagnosis not present

## 2015-02-26 DIAGNOSIS — D123 Benign neoplasm of transverse colon: Secondary | ICD-10-CM

## 2015-02-26 DIAGNOSIS — B182 Chronic viral hepatitis C: Secondary | ICD-10-CM | POA: Insufficient documentation

## 2015-02-26 DIAGNOSIS — Z96651 Presence of right artificial knee joint: Secondary | ICD-10-CM | POA: Insufficient documentation

## 2015-02-26 DIAGNOSIS — Z1211 Encounter for screening for malignant neoplasm of colon: Secondary | ICD-10-CM

## 2015-02-26 DIAGNOSIS — D122 Benign neoplasm of ascending colon: Secondary | ICD-10-CM

## 2015-02-26 DIAGNOSIS — F17210 Nicotine dependence, cigarettes, uncomplicated: Secondary | ICD-10-CM | POA: Diagnosis not present

## 2015-02-26 DIAGNOSIS — D124 Benign neoplasm of descending colon: Secondary | ICD-10-CM | POA: Insufficient documentation

## 2015-02-26 DIAGNOSIS — D125 Benign neoplasm of sigmoid colon: Secondary | ICD-10-CM | POA: Diagnosis not present

## 2015-02-26 DIAGNOSIS — Z6841 Body Mass Index (BMI) 40.0 and over, adult: Secondary | ICD-10-CM | POA: Diagnosis not present

## 2015-02-26 DIAGNOSIS — D120 Benign neoplasm of cecum: Secondary | ICD-10-CM | POA: Diagnosis not present

## 2015-02-26 DIAGNOSIS — D126 Benign neoplasm of colon, unspecified: Secondary | ICD-10-CM

## 2015-02-26 HISTORY — PX: COLONOSCOPY WITH PROPOFOL: SHX5780

## 2015-02-26 HISTORY — DX: Adverse effect of unspecified anesthetic, initial encounter: T41.45XA

## 2015-02-26 HISTORY — DX: Stiffness of unspecified knee, not elsewhere classified: M25.669

## 2015-02-26 HISTORY — DX: Other specified postprocedural states: Z98.890

## 2015-02-26 HISTORY — DX: Other complications of anesthesia, initial encounter: T88.59XA

## 2015-02-26 HISTORY — DX: Nausea with vomiting, unspecified: R11.2

## 2015-02-26 SURGERY — COLONOSCOPY WITH PROPOFOL
Anesthesia: Monitor Anesthesia Care

## 2015-02-26 MED ORDER — PROPOFOL 10 MG/ML IV BOLUS
INTRAVENOUS | Status: AC
  Filled 2015-02-26: qty 20

## 2015-02-26 MED ORDER — ONDANSETRON HCL 4 MG/2ML IJ SOLN
INTRAMUSCULAR | Status: AC
  Filled 2015-02-26: qty 2

## 2015-02-26 MED ORDER — ONDANSETRON HCL 4 MG/2ML IJ SOLN
INTRAMUSCULAR | Status: DC | PRN
  Administered 2015-02-26: 4 mg via INTRAVENOUS

## 2015-02-26 MED ORDER — SODIUM CHLORIDE 0.9 % IV SOLN: INTRAVENOUS | Status: DC

## 2015-02-26 MED ORDER — LACTATED RINGERS IV SOLN
INTRAVENOUS | Status: DC | PRN
  Administered 2015-02-26: 09:00:00 via INTRAVENOUS

## 2015-02-26 MED ORDER — PROPOFOL INFUSION 10 MG/ML OPTIME
INTRAVENOUS | Status: DC | PRN
  Administered 2015-02-26: 300 ug/kg/min via INTRAVENOUS

## 2015-02-26 SURGICAL SUPPLY — 22 items

## 2015-02-26 NOTE — Op Note (Signed)
[facility] [place], [address]   COLONOSCOPY PROCEDURE REPORT  PATIENT: [name], [name]  MR#: [name] BIRTHDATE: Jul 11, 1956 , 59  yrs. old GENDER: female ENDOSCOPIST: Inda Castle, MD REFERRED BY: PROCEDURE DATE:  02/26/2015 PROCEDURE:   Colonoscopy, screening, Colonoscopy with cold biopsy polypectomy, and Colonoscopy with snare polypectomy First Screening Colonoscopy - Avg.  risk and is 50 yrs.  old or older Yes.  Prior Negative Screening - Now for repeat screening. N/A  History of Adenoma - Now for follow-up colonoscopy & has been > or = to 3 yrs.  N/A  Polyps removed today? Yes ASA CLASS:   Class II INDICATIONS:Colorectal Neoplasm Risk Assessment for this procedure is average risk. MEDICATIONS: Monitored anesthesia care  DESCRIPTION OF PROCEDURE:   After the risks benefits and alternatives of the procedure were thoroughly explained, informed consent was obtained.  The digital rectal exam revealed no abnormalities of the rectum.   The Pentax Ped Colon [id number] endoscope was introduced through the anus and advanced to the cecum, which was identified by both the appendix and ileocecal valve. No adverse events experienced.   The quality of the prep was (Suprep was used) excellent.  The instrument was then slowly withdrawn as the colon was fully examined. Estimated blood loss is zero unless otherwise noted in this procedure report.      COLON FINDINGS: A sessile polyp measuring 4 mm in size was found at the cecum.  A polypectomy was performed with a cold snare.  The resection was complete, the polyp tissue was completely retrieved and sent to histology.   Two sessile polyps ranging from 5 to 70mm in size were found in the ascending colon.  Polypectomies were performed using snare cautery and with a cold snare.  The resection was complete, the polyp tissue was completely retrieved and sent to histology.   Four sessile polyps ranging from 2  to 52mm in size were found in the transverse colon.  Polypectomies were performed with a cold snare, using snare cautery and with cold forceps.  The resection was complete, the polyp tissue was completely retrieved and sent to histology.   Three sessile polyps ranging from 2 to 17mm in size were found in the descending colon.  Polypectomies were performed with a cold snare and with cold forceps.  The resection was complete, the polyp tissue was completely retrieved and sent to histology.   A sessile polyp measuring 2 mm in size was found in the sigmoid colon.  A polypectomy was performed with cold forceps.  Retroflexed views revealed no abnormalities. The time to cecum =    Withdrawal time = 27:00   The scope was withdrawn and the procedure completed. COMPLICATIONS: There were no immediate complications.  ENDOSCOPIC IMPRESSION: 1.   Sessile polyp was found at the cecum; polypectomy was performed with a cold snare 2.   Two sessile polyps ranging from 5 to 39mm in size were found in the ascending colon; polypectomies were performed using snare cautery and with a cold snare 3.   Four sessile polyps ranging from 2 to 60mm in size were found in the transverse colon; polypectomies were performed with a cold snare, using snare cautery and with cold forceps 4.   Three sessile polyps ranging from 2 to 50mm in size were found in the descending colon; polypectomies were performed with a cold snare and with cold forceps 5.   Sessile polyp was found in the sigmoid colon; polypectomy was performed with  cold forceps  RECOMMENDATIONS: Repeat Colonoscopy in 3 years.  eSigned:  Inda Castle, MD 02/26/2015 10:13 AM   cc:   PATIENT NAME:  [name], [name] MR#: [id number]

## 2015-02-26 NOTE — Discharge Instructions (Signed)
Avoid NSAIDs for 2 weeks

## 2015-02-26 NOTE — Transfer of Care (Signed)
Immediate Anesthesia Transfer of Care Note  Patient: [name]  Procedure(s) Performed: Procedure(s): COLONOSCOPY WITH PROPOFOL (N/A)  Patient Location: PACU  Anesthesia Type:MAC  Level of Consciousness: awake, sedated and patient cooperative  Airway & Oxygen Therapy: Patient Spontanous Breathing and Patient connected to face mask oxygen  Post-op Assessment: Report given to RN, Post -op Vital signs reviewed and stable and Patient moving all extremities X 4  Post vital signs: stable  Last Vitals:  Filed Vitals:   02/26/15 0900  BP: 129/65  Pulse: 72  Temp: 37.2 C  Resp: 17    Complications: No apparent anesthesia complications

## 2015-02-26 NOTE — H&P (Signed)
_                                                                                                                History of Present Illness:  [name] is a 59 year old Afro-American female with history of hepatitis C here for screening colonoscopy.  She denies change of bowel habits, abdominal pain or rectal bleeding.   Past Medical History  Diagnosis Date  . Osteoporosis   . Depression   . Hypertension   . Anemia   . Obesity   . Chronic kidney disease   . Hypothyroid     mild- no medication  . Transfusion history     70's, 80's ? hepatitis C attributed to past transfusions  . Hep C w/o coma, chronic     07-20-14 being presently tx. "Harvoni"-good response per pt. will complete in 7days- Dr. Linus Salmons follows [place] [facility].  . Hep B w/o coma   . Decreased mobility     hx. Rt. hip "avascular necrosis" -unable to weight bear long periods, "using wheelchair"..  . Superficial thrombophlebitis     left leg  . Complication of anesthesia   . PONV (postoperative nausea and vomiting)   . Knee stiffness     right knee -hard to bend at knee   Past Surgical History  Procedure Laterality Date  . Abdominal hysterectomy    . Total knee arthroplasty    . Cholecystectomy    . Joint replacement      right knee  . Arthroscopies      knee-right x 7   family history includes Cancer in her father and mother; Diabetes in her brother and mother; Heart disease in her mother; Hypertension in her brother, mother, and sister. There is no history of Colon cancer. Current Facility-Administered Medications  Medication Dose Route Frequency Provider Last Rate Last Dose  . 0.9 %  sodium chloride infusion   Intravenous Continuous Inda Castle, MD       Allergies as of 12/11/2014 - Review Complete 11/20/2014  Allergen Reaction Noted  . Penicillins Anaphylaxis 04/29/2009  . Demerol Nausea And Vomiting 10/17/2011    reports that she has been smoking  Cigarettes.  She has a 10 pack-year smoking history. She has never used smokeless tobacco. She reports that she does not drink alcohol or use illicit drugs.   Review of Systems: Pertinent positive and negative review of systems were noted in the above HPI section. All other review of systems were otherwise negative.  Vital signs were reviewed in today's medical record Physical Exam: General: Well developed , well nourished, no acute distress Skin: anicteric Head: Normocephalic and atraumatic Eyes:  sclerae anicteric, EOMI Ears: Normal auditory acuity Mouth: No deformity or lesions Neck: Supple, no masses or thyromegaly Lymph Nodes: no lymphadenopathy Lungs: Clear throughout to auscultation Heart: Regular rate and rhythm; no murmurs, rubs or bruits Gastroinestinal: Soft, non tender and non distended. No masses, hepatosplenomegaly  or hernias noted. Normal Bowel sounds Rectal:deferred Musculoskeletal: Symmetrical with no gross deformities  Skin: No lesions on visible extremities Pulses:  Normal pulses noted Extremities: No clubbing, cyanosis, edema or deformities noted Neurological: Alert oriented x 4, grossly nonfocal Cervical Nodes:  No significant cervical adenopathy Inguinal Nodes: No significant inguinal adenopathy Psychological:  Alert and cooperative. Normal mood and affect  Impression #1 history of hepatitis C-status post treatment with harvoni #2 screening colonoscopy

## 2015-02-26 NOTE — Anesthesia Postprocedure Evaluation (Signed)
  Anesthesia Post-op Note  Patient: [name]  Procedure(s) Performed: Procedure(s) (LRB): COLONOSCOPY WITH PROPOFOL (N/A)  Patient Location: PACU  Anesthesia Type: MAC  Level of Consciousness: awake and alert   Airway and Oxygen Therapy: Patient Spontanous Breathing  Post-op Pain: mild  Post-op Assessment: Post-op Vital signs reviewed, Patient's Cardiovascular Status Stable, Respiratory Function Stable, Patent Airway and No signs of Nausea or vomiting  Last Vitals:  Filed Vitals:   02/26/15 1035  BP:   Pulse: 67  Temp:   Resp: 26    Post-op Vital Signs: stable   Complications: No apparent anesthesia complications

## 2015-02-27 ENCOUNTER — Encounter (HOSPITAL_COMMUNITY): Payer: Self-pay | Admitting: Gastroenterology

## 2015-02-28 ENCOUNTER — Encounter: Payer: Self-pay | Admitting: Gastroenterology

## 2015-03-12 ENCOUNTER — Telehealth: Payer: Self-pay | Admitting: Internal Medicine

## 2015-03-12 NOTE — Telephone Encounter (Signed)
Pharmacy requesting a refill for Miconazole 2% cream but I dont see this Rx on file. Please f/u.

## 2015-03-14 NOTE — Telephone Encounter (Signed)
Rx faxed over today again.

## 2015-03-20 ENCOUNTER — Encounter: Payer: Medicare Other | Attending: Physical Medicine and Rehabilitation | Admitting: Registered Nurse

## 2015-03-20 ENCOUNTER — Other Ambulatory Visit: Payer: Self-pay | Admitting: Physical Medicine & Rehabilitation

## 2015-03-20 ENCOUNTER — Encounter: Payer: Self-pay | Admitting: Registered Nurse

## 2015-03-20 VITALS — BP 134/82 | HR 77 | Resp 14

## 2015-03-20 DIAGNOSIS — M25552 Pain in left hip: Secondary | ICD-10-CM

## 2015-03-20 DIAGNOSIS — M8700 Idiopathic aseptic necrosis of unspecified bone: Secondary | ICD-10-CM

## 2015-03-20 DIAGNOSIS — Z5181 Encounter for therapeutic drug level monitoring: Secondary | ICD-10-CM

## 2015-03-20 DIAGNOSIS — G894 Chronic pain syndrome: Secondary | ICD-10-CM | POA: Diagnosis not present

## 2015-03-20 DIAGNOSIS — M24551 Contracture, right hip: Secondary | ICD-10-CM

## 2015-03-20 DIAGNOSIS — Z79899 Other long term (current) drug therapy: Secondary | ICD-10-CM | POA: Diagnosis not present

## 2015-03-20 DIAGNOSIS — M879 Osteonecrosis, unspecified: Secondary | ICD-10-CM

## 2015-03-20 DIAGNOSIS — M2170 Unequal limb length (acquired), unspecified site: Secondary | ICD-10-CM

## 2015-03-20 MED ORDER — OXYCODONE HCL 5 MG PO TABS: 5.0000 mg | ORAL_TABLET | Freq: Three times a day (TID) | ORAL | Status: DC | PRN

## 2015-03-20 NOTE — Progress Notes (Signed)
Subjective:    Patient ID: [name], female    DOB: 11/09/1955, 60 y.o.   MRN: [name]  HPI: [name] is a 59 year old female who returns for follow up for chronic pain and medication refill. She says her pain is located in her lower back,left groin, left hip and bilateral lower extremities. She rates her pain 8. Her current exercise regime is performing stretching and chair exercises she's walking with walker in her home. Also states she was diagnosed with blood clot and being followed by Dr. Bridgett Larsson( [facility]) On 02/26/2015 she had a colonoscopy. She hasn't been seen since 01/15/15 [facility] Controlled Substance Reporting System checked last prescription filled on 01/15/15. She had to go out of town due to death of her best friend. UDS obtained.   Pain Inventory Average Pain 8 Pain Right Now 8 My pain is sharp, burning, stabbing and aching  In the last 24 hours, has pain interfered with the following? General activity 8 Relation with others 6 Enjoyment of life 8 What TIME of day is your pain at its worst? morning Sleep (in general) Poor  Pain is worse with: walking, bending, inactivity and standing Pain improves with: rest, medication and injections Relief from Meds: 7  Mobility walk with assistance use a walker how many minutes can you walk? 2 ability to climb steps?  no use a wheelchair Do you have any goals in this area?  yes  Function Do you have any goals in this area?  no  Neuro/Psych No problems in this area  Prior Studies Any changes since last visit?  no  Physicians involved in your care Any changes since last visit?  no   Family History  Problem Relation Age of Onset  . Cancer Mother   . Diabetes Mother   . Heart disease Mother   . Hypertension Mother   . Cancer Father   . Hypertension Sister   . Hypertension Brother   . Diabetes Brother   . Colon cancer Neg Hx    History   Social History  . Marital Status:  Divorced    Spouse Name: N/A  . Number of Children: N/A  . Years of Education: N/A   Social History Main Topics  . Smoking status: Current Every Day Smoker -- 0.50 packs/day for 20 years    Types: Cigarettes  . Smokeless tobacco: Never Used     Comment: trying to cut back  . Alcohol Use: No  . Drug Use: No  . Sexual Activity: Not on file   Other Topics Concern  . None   Social History Narrative   Past Surgical History  Procedure Laterality Date  . Abdominal hysterectomy    . Total knee arthroplasty    . Cholecystectomy    . Joint replacement      right knee  . Arthroscopies      knee-right x 7  . Colonoscopy with propofol N/A 02/26/2015    Procedure: COLONOSCOPY WITH PROPOFOL;  Surgeon: Inda Castle, MD;  Location: [facility];  Service: Endoscopy;  Laterality: N/A;   Past Medical History  Diagnosis Date  . Osteoporosis   . Depression   . Hypertension   . Anemia   . Obesity   . Chronic kidney disease   . Hypothyroid     mild- no medication  . Transfusion history     70's, 80's ? hepatitis C attributed to past transfusions  . Hep C w/o  coma, chronic     07-20-14 being presently tx. "Harvoni"-good response per pt. will complete in 7days- Dr. Linus Salmons follows [place] [facility].  . Hep B w/o coma   . Decreased mobility     hx. Rt. hip "avascular necrosis" -unable to weight bear long periods, "using wheelchair"..  . Superficial thrombophlebitis     left leg  . Complication of anesthesia   . PONV (postoperative nausea and vomiting)   . Knee stiffness     right knee -hard to bend at knee   BP 134/82 mmHg  Pulse 77  Resp 14  SpO2 96%  Opioid Risk Score:   Fall Risk Score: Moderate Fall Risk (6-13 points)`1  Depression screen PHQ 2/9  Depression screen [facility] 2/9 12/17/2014 11/20/2014 07/12/2014 07/12/2014 06/06/2014 05/08/2014  Decreased Interest 2 1 0 0 0 0  Down, Depressed, Hopeless 2 1 0 0 0 0  PHQ - 2 Score 4 2 0 0 0 0  Altered sleeping 1 1  - - - -  Tired, decreased energy 2 1 - - - -  Change in appetite 2 0 - - - -  Feeling bad or failure about yourself  1 1 - - - -  Trouble concentrating 1 0 - - - -  Moving slowly or fidgety/restless 0 0 - - - -  Suicidal thoughts 0 0 - - - -  PHQ-9 Score 11 5 - - - -     Review of Systems  All other systems reviewed and are negative.      Objective:   Physical Exam  Constitutional: She is oriented to person, place, and time. She appears well-developed and well-nourished.  HENT:  Head: Normocephalic and atraumatic.  Neck: Normal range of motion. Neck supple.  Cardiovascular: Normal rate and regular rhythm.   Pulmonary/Chest: Effort normal and breath sounds normal.  Musculoskeletal: She exhibits edema.  Normal Muscle Bulk and Muscle Testing Reveals: Upper Extremities: Full ROM and Muscle Strength 5/5 Left Greater Trochanteric Tenderness Lower Extremities: Right: Decreased ROM and Muscle Strength 4/5 Left: Full ROM and Muscle Strength 5/5 Arrived in Motorized Wheelchair    Neurological: She is alert and oriented to person, place, and time.  Skin: Skin is warm and dry.  Psychiatric: She has a normal mood and affect.  Nursing note and vitals reviewed.         Assessment & Plan:  1. Chronic right hip pain due to AVN with joint collapse: Continue oxycodone tid, Voltaren gel and Robaxin prn.  Refilled: Oxycodone 5 mg #100--use one pill every 8 hours prn pain. May take an extra tablet when pain is sever.  2. Low back pain: Due to RLE leg length discrepancy.  Continue with current medication regime and heat therapy. 3. Balance deficits with poor endurance: Continue using her motorized wheelchair to increse her activity.  4. Left Hip Pain: Continue with heat and exercise and voltaren gel regimen.  20 minutes of face to face patient care time was spent during this visit. All questions were encouraged and answered.   F/U in 1 month

## 2015-03-21 LAB — PMP ALCOHOL METABOLITE (ETG): Ethyl Glucuronide (EtG): NEGATIVE ng/mL

## 2015-03-22 LAB — PRESCRIPTION MONITORING PROFILE (SOLSTAS)
Amphetamine/Meth: NEGATIVE ng/mL
BENZODIAZEPINE SCREEN, URINE: NEGATIVE ng/mL
Barbiturate Screen, Urine: NEGATIVE ng/mL
Buprenorphine, Urine: NEGATIVE ng/mL
COCAINE METABOLITES: NEGATIVE ng/mL
Cannabinoid Scrn, Ur: NEGATIVE ng/mL
Carisoprodol, Urine: NEGATIVE ng/mL
Creatinine, Urine: 201.55 mg/dL (ref >20.0)
Fentanyl, Ur: NEGATIVE ng/mL
MDMA URINE: NEGATIVE ng/mL
Meperidine, Ur: NEGATIVE ng/mL
Methadone Screen, Urine: NEGATIVE ng/mL
NITRITES URINE, INITIAL: NEGATIVE ug/mL
Opiate Screen, Urine: NEGATIVE ng/mL
Oxycodone Screen, Ur: NEGATIVE ng/mL
PROPOXYPHENE: NEGATIVE ng/mL
Tapentadol, urine: NEGATIVE ng/mL
Tramadol Scrn, Ur: NEGATIVE ng/mL
Zolpidem, Urine: NEGATIVE ng/mL
pH, Initial: 6.9 pH (ref 4.5–8.9)

## 2015-03-29 MED ORDER — MICONAZOLE NITRATE 2 % EX CREA: 1.0000 "application " | TOPICAL_CREAM | Freq: Two times a day (BID) | CUTANEOUS | Status: DC

## 2015-04-03 NOTE — Progress Notes (Signed)
Urine drug screen for this encounter is consistent for prescribed medication not taken for a month, therefore negative.

## 2015-04-06 DIAGNOSIS — I10 Essential (primary) hypertension: Secondary | ICD-10-CM | POA: Diagnosis not present

## 2015-04-06 DIAGNOSIS — R55 Syncope and collapse: Secondary | ICD-10-CM | POA: Diagnosis not present

## 2015-04-06 DIAGNOSIS — Z5321 Procedure and treatment not carried out due to patient leaving prior to being seen by health care provider: Secondary | ICD-10-CM | POA: Diagnosis not present

## 2015-04-06 DIAGNOSIS — R531 Weakness: Secondary | ICD-10-CM | POA: Diagnosis not present

## 2015-04-08 ENCOUNTER — Ambulatory Visit: Payer: Medicare Other | Admitting: Internal Medicine

## 2015-04-17 ENCOUNTER — Encounter: Payer: Medicare Other | Attending: Physical Medicine and Rehabilitation | Admitting: Registered Nurse

## 2015-04-17 ENCOUNTER — Encounter: Payer: Self-pay | Admitting: Registered Nurse

## 2015-04-17 VITALS — BP 116/74 | HR 100

## 2015-04-17 DIAGNOSIS — M25552 Pain in left hip: Secondary | ICD-10-CM

## 2015-04-17 DIAGNOSIS — M879 Osteonecrosis, unspecified: Secondary | ICD-10-CM

## 2015-04-17 DIAGNOSIS — Z5181 Encounter for therapeutic drug level monitoring: Secondary | ICD-10-CM | POA: Diagnosis not present

## 2015-04-17 DIAGNOSIS — G894 Chronic pain syndrome: Secondary | ICD-10-CM | POA: Insufficient documentation

## 2015-04-17 DIAGNOSIS — Z79899 Other long term (current) drug therapy: Secondary | ICD-10-CM

## 2015-04-17 DIAGNOSIS — M8700 Idiopathic aseptic necrosis of unspecified bone: Secondary | ICD-10-CM

## 2015-04-17 MED ORDER — HYDROCODONE-ACETAMINOPHEN 7.5-325 MG PO TABS: 1.0000 | ORAL_TABLET | Freq: Three times a day (TID) | ORAL | Status: DC | PRN

## 2015-04-17 NOTE — Progress Notes (Signed)
Subjective:    Patient ID: [name], female    DOB: 03-27-1956, 59 y.o.   MRN: [id number]  HPI: [name] is a 59 year old female who returns for follow up for chronic pain and medication refill. She says her pain is located in her right shoulder, lower back and left hip. She rates her pain 6. Her current exercise regime is performing stretching and chair exercises she's walking with walker in her home. Also asked if she can discontinue her oxycodone she states it's not working as effectively as usual. She's asking to be changed to Hydrocodone she feels as though her body has built up a tolerance to the oxycodone. She's hoping if she tries the Hydrocodone for a few months and switch back to the oxycodone her pain will be under control. She does not want anything stronger she states. We will change her to Hydrocodone 7.5 mg TID she verbalizes understanding. Dr. Letta Pate aware and agrees with changes.  Also states she has taken herself off all her medications due to diarrhea, educated on being compliant with medications and the adverse effects that can happen with abrupt discontinuation of medications. She verbalizes understanding Encouraged to call her PCP for F/U appointment  she verbalizes understanding.   Pain Inventory Average Pain 6 Pain Right Now 6 My pain is sharp, burning, stabbing and aching  In the last 24 hours, has pain interfered with the following? General activity 8 Relation with others 8 Enjoyment of life 8 What TIME of day is your pain at its worst? varies Sleep (in general) Fair  Pain is worse with: some activites Pain improves with: medication Relief from Meds: not as effective as it once was  Mobility do you drive?  yes use a wheelchair transfers alone  Function Do you have any goals in this area?  yes  Neuro/Psych bowel control problems  Prior Studies Any changes since last visit?  no  Physicians involved in your care Any changes since last  visit?  no   Family History  Problem Relation Age of Onset  . Cancer Mother   . Diabetes Mother   . Heart disease Mother   . Hypertension Mother   . Cancer Father   . Hypertension Sister   . Hypertension Brother   . Diabetes Brother   . Colon cancer Neg Hx    Social History   Social History  . Marital Status: Divorced    Spouse Name: N/A  . Number of Children: N/A  . Years of Education: N/A   Social History Main Topics  . Smoking status: Current Every Day Smoker -- 0.50 packs/day for 20 years    Types: Cigarettes  . Smokeless tobacco: Never Used     Comment: trying to cut back  . Alcohol Use: No  . Drug Use: No  . Sexual Activity: Not Asked   Other Topics Concern  . None   Social History Narrative   Past Surgical History  Procedure Laterality Date  . Abdominal hysterectomy    . Total knee arthroplasty    . Cholecystectomy    . Joint replacement      right knee  . Arthroscopies      knee-right x 7  . Colonoscopy with propofol N/A 02/26/2015    Procedure: COLONOSCOPY WITH PROPOFOL;  Surgeon: Inda Castle, MD;  Location: [facility];  Service: Endoscopy;  Laterality: N/A;   Past Medical History  Diagnosis Date  . Osteoporosis   . Depression   .  Hypertension   . Anemia   . Obesity   . Chronic kidney disease   . Hypothyroid     mild- no medication  . Transfusion history     70's, 80's ? hepatitis C attributed to past transfusions  . Hep C w/o coma, chronic     07-20-14 being presently tx. "Harvoni"-good response per pt. will complete in 7days- Dr. Linus Salmons follows [place] [facility].  . Hep B w/o coma   . Decreased mobility     hx. Rt. hip "avascular necrosis" -unable to weight bear long periods, "using wheelchair"..  . Superficial thrombophlebitis     left leg  . Complication of anesthesia   . PONV (postoperative nausea and vomiting)   . Knee stiffness     right knee -hard to bend at knee   BP 116/74 mmHg  Pulse 100  SpO2  96%  Opioid Risk Score:   Fall Risk Score:  `1  Depression screen PHQ 2/9  Depression screen [facility] 2/9 04/17/2015 12/17/2014 11/20/2014 07/12/2014 07/12/2014 06/06/2014 05/08/2014  Decreased Interest 2 2 1  0 0 0 0  Down, Depressed, Hopeless 2 2 1  0 0 0 0  PHQ - 2 Score 4 4 2  0 0 0 0  Altered sleeping - 1 1 - - - -  Tired, decreased energy - 2 1 - - - -  Change in appetite - 2 0 - - - -  Feeling bad or failure about yourself  - 1 1 - - - -  Trouble concentrating - 1 0 - - - -  Moving slowly or fidgety/restless - 0 0 - - - -  Suicidal thoughts - 0 0 - - - -  PHQ-9 Score - 11 5 - - - -    Review of Systems  Gastrointestinal:       Bowel Control Problems  Musculoskeletal: Positive for gait problem.  All other systems reviewed and are negative.      Objective:   Physical Exam  Constitutional: She is oriented to person, place, and time. She appears well-developed and well-nourished.  HENT:  Head: Normocephalic and atraumatic.  Neck: Normal range of motion. Neck supple.  Cardiovascular: Normal rate and regular rhythm.   Pulmonary/Chest: Effort normal and breath sounds normal.  Musculoskeletal:  Normal Muscle Bulk and Muscle Testing Reveals: Upper Extremities: Full ROM and Muscle Strength 5/5 Right Rhomboid Tenderness Lumbar Paraspinal Tenderness: L-3- L-5 Lower Extremities: Right Decreased ROM and Muscle Strength 4/5 Right Lower Extremity Flexion Produces pain into Hip Left Lower Extremity: Full ROM and Muscle Strength 5/5 Left Lower Extremity Flexion Produces Pain into Patella  Arrived in Motorized wheelchair  Neurological: She is alert and oriented to person, place, and time.  Skin: Skin is warm and dry.  Psychiatric: She has a normal mood and affect.  Vitals reviewed.         Assessment & Plan:  1. Chronic right hip pain due to AVN with joint collapse: Continue oxycodone tid, Voltaren gel and Robaxin prn.  RX: Hydrocodone 7.5 mg -use one tablet every 8 hours as needed  for moderate pain. #90. 2. Low back pain: Due to RLE leg length discrepancy.  Continue with current medication regime and heat therapy. 3. Balance deficits with poor endurance: Continue using her motorized wheelchair to increse her activity.  4. Left Hip Pain: Continue with heat and exercise and voltaren gel regimen.  30 minutes of face to face patient care time was spent during this visit. All questions  were encouraged and answered.   F/U in 1 month

## 2015-05-14 ENCOUNTER — Encounter: Payer: Self-pay | Admitting: Registered Nurse

## 2015-05-14 ENCOUNTER — Encounter: Payer: Medicare Other | Attending: Physical Medicine and Rehabilitation | Admitting: Registered Nurse

## 2015-05-14 VITALS — BP 135/78 | HR 74

## 2015-05-14 DIAGNOSIS — M25552 Pain in left hip: Secondary | ICD-10-CM | POA: Diagnosis not present

## 2015-05-14 DIAGNOSIS — Z5181 Encounter for therapeutic drug level monitoring: Secondary | ICD-10-CM | POA: Diagnosis not present

## 2015-05-14 DIAGNOSIS — M8700 Idiopathic aseptic necrosis of unspecified bone: Secondary | ICD-10-CM

## 2015-05-14 DIAGNOSIS — G894 Chronic pain syndrome: Secondary | ICD-10-CM

## 2015-05-14 DIAGNOSIS — Z79899 Other long term (current) drug therapy: Secondary | ICD-10-CM

## 2015-05-14 DIAGNOSIS — M879 Osteonecrosis, unspecified: Secondary | ICD-10-CM

## 2015-05-14 DIAGNOSIS — M2170 Unequal limb length (acquired), unspecified site: Secondary | ICD-10-CM

## 2015-05-14 MED ORDER — HYDROCODONE-ACETAMINOPHEN 7.5-325 MG PO TABS: 1.0000 | ORAL_TABLET | Freq: Three times a day (TID) | ORAL | Status: DC | PRN

## 2015-05-14 NOTE — Progress Notes (Signed)
Subjective:    Patient ID: [name], female    DOB: 03/08/1956, 59 y.o.   MRN: [name]  HPI: [name] is a 59 year old female who returns for follow up for chronic pain and medication refill. She says her pain is located in her right shoulder, lower back and left hip. She rates her pain 7.  Having increase intensity of pain will increase hydrocodone to 100 tablets. She verbalizes understanding.Her current exercise regime is performing stretching and chair exercises occassionally she's walking with walker in her home. Her brother passed away 05/07/2015 tearful emotional support given.  Pain Inventory Average Pain 7 Pain Right Now 6 My pain is sharp, stabbing and aching  In the last 24 hours, has pain interfered with the following? General activity na Relation with others na Enjoyment of life na What TIME of day is your pain at its worst? na Sleep (in general) NA  Pain is worse with: na Pain improves with: na Relief from Meds: na  Mobility how many minutes can you walk? 2 ability to climb steps?  no Do you have any goals in this area?  yes  Function Do you have any goals in this area?  no  Neuro/Psych No problems in this area  Prior Studies Any changes since last visit?  no  Physicians involved in your care Any changes since last visit?  no   Family History  Problem Relation Age of Onset  . Cancer Mother   . Diabetes Mother   . Heart disease Mother   . Hypertension Mother   . Cancer Father   . Hypertension Sister   . Hypertension Brother   . Diabetes Brother   . Colon cancer Neg Hx    Social History   Social History  . Marital Status: Divorced    Spouse Name: N/A  . Number of Children: N/A  . Years of Education: N/A   Social History Main Topics  . Smoking status: Current Every Day Smoker -- 0.50 packs/day for 20 years    Types: Cigarettes  . Smokeless tobacco: Never Used     Comment: trying to cut back  . Alcohol Use: No  . Drug Use: No  .  Sexual Activity: Not Asked   Other Topics Concern  . None   Social History Narrative   Past Surgical History  Procedure Laterality Date  . Abdominal hysterectomy    . Total knee arthroplasty    . Cholecystectomy    . Joint replacement      right knee  . Arthroscopies      knee-right x 7  . Colonoscopy with propofol N/A 02/26/2015    Procedure: COLONOSCOPY WITH PROPOFOL;  Surgeon: Inda Castle, MD;  Location: [facility];  Service: Endoscopy;  Laterality: N/A;   Past Medical History  Diagnosis Date  . Osteoporosis   . Depression   . Hypertension   . Anemia   . Obesity   . Chronic kidney disease   . Hypothyroid     mild- no medication  . Transfusion history     70's, 80's ? hepatitis C attributed to past transfusions  . Hep C w/o coma, chronic     07-20-14 being presently tx. "Harvoni"-good response per pt. will complete in 7days- Dr. Linus Salmons follows [place] [facility].  . Hep B w/o coma   . Decreased mobility     hx. Rt. hip "avascular necrosis" -unable to weight bear long periods, "using wheelchair"..  .  Superficial thrombophlebitis     left leg  . Complication of anesthesia   . PONV (postoperative nausea and vomiting)   . Knee stiffness     right knee -hard to bend at knee   BP 89/62 mmHg  Pulse 74  SpO2 96%  Opioid Risk Score:   Fall Risk Score:  `1  Depression screen PHQ 2/9  Depression screen [facility] 2/9 05/14/2015 04/17/2015 12/17/2014 11/20/2014 07/12/2014 07/12/2014 06/06/2014  Decreased Interest 3 2 2 1  0 0 0  Down, Depressed, Hopeless 3 2 2 1  0 0 0  PHQ - 2 Score 6 4 4 2  0 0 0  Altered sleeping - - 1 1 - - -  Tired, decreased energy - - 2 1 - - -  Change in appetite - - 2 0 - - -  Feeling bad or failure about yourself  - - 1 1 - - -  Trouble concentrating - - 1 0 - - -  Moving slowly or fidgety/restless - - 0 0 - - -  Suicidal thoughts - - 0 0 - - -  PHQ-9 Score - - 11 5 - - -     Review of Systems  All other systems reviewed and  are negative.      Objective:   Physical Exam  Constitutional: She is oriented to person, place, and time. She appears well-developed and well-nourished.  HENT:  Head: Normocephalic and atraumatic.  Neck: Normal range of motion. Neck supple.  Cardiovascular: Normal rate and regular rhythm.   Pulmonary/Chest: Effort normal and breath sounds normal.  Musculoskeletal:  Normal Muscle Bulk and Muscle Testing Reveals: Upper Extremities: Full ROM and Muscle Strength 5/5 Thoracic Paraspinal Tenderness: T-6- T-8 Lumbar Paraspinal Tenderness: L-3- L-5 Lower Extremities: Right: Decreased ROM and Muscle Strength 5/5 Left: Full ROM and Muscle Strength 5/5 Arrived via Motorized Wheelchair  Neurological: She is alert and oriented to person, place, and time.  Skin: Skin is warm and dry.  Psychiatric: She has a normal mood and affect.  Nursing note and vitals reviewed.         Assessment & Plan:  1. Chronic right hip pain due to AVN with joint collapse: Continue oxycodone tid, Voltaren gel and Robaxin prn.  RX: Hydrocodone 7.5 mg -use one tablet every 8 hours as needed for moderate pain. May take an extra tablet when pain is sever. Increased to  #100. 2. Low back pain: Due to RLE leg length discrepancy.  Continue with current medication regime and heat therapy. 3. Balance deficits with poor endurance: Continue using her motorized wheelchair to increse her activity.  4. Left Hip Pain: Continue with heat and exercise and voltaren gel regimen.  30 minutes of face to face patient care time was spent during this visit. All questions were encouraged and answered.   F/U in 1 month

## 2015-05-28 ENCOUNTER — Encounter: Payer: Self-pay | Admitting: Gastroenterology

## 2015-05-28 ENCOUNTER — Ambulatory Visit (INDEPENDENT_AMBULATORY_CARE_PROVIDER_SITE_OTHER): Payer: Medicare Other | Admitting: Gastroenterology

## 2015-05-28 ENCOUNTER — Other Ambulatory Visit (INDEPENDENT_AMBULATORY_CARE_PROVIDER_SITE_OTHER): Payer: Medicare Other

## 2015-05-28 VITALS — BP 106/70 | HR 72 | Ht 66.0 in

## 2015-05-28 DIAGNOSIS — D126 Benign neoplasm of colon, unspecified: Secondary | ICD-10-CM | POA: Diagnosis not present

## 2015-05-28 DIAGNOSIS — R197 Diarrhea, unspecified: Secondary | ICD-10-CM

## 2015-05-28 DIAGNOSIS — B182 Chronic viral hepatitis C: Secondary | ICD-10-CM | POA: Diagnosis not present

## 2015-05-28 DIAGNOSIS — K529 Noninfective gastroenteritis and colitis, unspecified: Secondary | ICD-10-CM | POA: Insufficient documentation

## 2015-05-28 LAB — COMPREHENSIVE METABOLIC PANEL
ALT: 13 U/L (ref 0–35)
AST: 20 U/L (ref 0–37)
Albumin: 3.8 g/dL (ref 3.5–5.2)
Alkaline Phosphatase: 79 U/L (ref 39–117)
BILIRUBIN TOTAL: 0.5 mg/dL (ref 0.2–1.2)
BUN: 10 mg/dL (ref 6–23)
CO2: 27 meq/L (ref 19–32)
CREATININE: 0.57 mg/dL (ref 0.40–1.20)
Calcium: 9.2 mg/dL (ref 8.4–10.5)
Chloride: 104 mEq/L (ref 96–112)
GFR: 114.77 mL/min (ref >60.00)
Glucose, Bld: 111 mg/dL — ABNORMAL HIGH (ref 70–99)
Potassium: 3.7 mEq/L (ref 3.5–5.1)
Sodium: 138 mEq/L (ref 135–145)
TOTAL PROTEIN: 8.2 g/dL (ref 6.0–8.3)

## 2015-05-28 LAB — CBC WITH DIFFERENTIAL/PLATELET
BASOS ABS: 0 10*3/uL (ref 0.0–0.1)
Basophils Relative: 0.4 % (ref 0.0–3.0)
Eosinophils Absolute: 0.1 10*3/uL (ref 0.0–0.7)
Eosinophils Relative: 1.5 % (ref 0.0–5.0)
HEMATOCRIT: 37.6 % (ref 36.0–46.0)
Hemoglobin: 12.7 g/dL (ref 12.0–15.0)
LYMPHS ABS: 3 10*3/uL (ref 0.7–4.0)
LYMPHS PCT: 42.8 % (ref 12.0–46.0)
MCHC: 33.7 g/dL (ref 30.0–36.0)
MCV: 90.1 fl (ref 78.0–100.0)
Monocytes Absolute: 0.5 10*3/uL (ref 0.1–1.0)
Monocytes Relative: 7.1 % (ref 3.0–12.0)
NEUTROS PCT: 48.2 % (ref 43.0–77.0)
Neutro Abs: 3.4 10*3/uL (ref 1.4–7.7)
PLATELETS: 128 10*3/uL — AB (ref 150.0–400.0)
RBC: 4.17 Mil/uL (ref 3.87–5.11)
RDW: 14.7 % (ref 11.5–15.5)
WBC: 7 10*3/uL (ref 4.0–10.5)

## 2015-05-28 LAB — HIGH SENSITIVITY CRP: CRP, High Sensitivity: 3.06 mg/L (ref 0.000–5.000)

## 2015-05-28 NOTE — Assessment & Plan Note (Signed)
Plan follow-up colonoscopy 3 years

## 2015-05-28 NOTE — Assessment & Plan Note (Signed)
Successfully treated with harvoni

## 2015-05-28 NOTE — Patient Instructions (Signed)
Go to the basement for labs today Use 2 Imodium every morning then every 6 hours as needed  We will contact you with test results

## 2015-05-28 NOTE — Assessment & Plan Note (Signed)
Six-month history of severe diarrhea with urgency and minimal abdominal pain.  Recent colonoscopy negative for mucosal abnormalities.  Etiology is not certain.  While harvoni may cause diarrhea she has been off this drug for several months.  New-onset malabsorption would be unusual.  Length of almost as long for an infection.  Microscopic colitis is a consideration.  Recon  Recommendations #1 check CBC, comprehensive metabolic profile, CRP #2 stool studies including pathogen panel, lactoferrin, [place] stain

## 2015-05-28 NOTE — Progress Notes (Signed)
_                                                                                                                History of Present Illness:  [name] is a 59 year old white female referred at the request of Dr. Annitta Needs for evaluation of diarrhea.  Over the past 6 months there is been a significant change of bowel habits.  Previously she was having one solid bowel movement daily.  Beginning 6 months ago she developed immediate postprandial diarrhea consisting of watery stools accompanied by urgency and mild.  Umbilical discomfort.  She does not awaken at night.  She has restricted her activity because of diarrhea and is afraid to eat.  There has been no change in medications, diet or antibiotic use.  She stopped all her medications for several days  but this did not affect the diarrhea.  Colonoscopy in June, 2016 was pertinent for multiple adenomatous polyps which were removed.  The patient has a history of hepatitis C which was successfully treated with harvoni.   Past Medical History  Diagnosis Date  . Osteoporosis   . Depression   . Hypertension   . Anemia   . Obesity   . Chronic kidney disease   . Hypothyroid     mild- no medication  . Transfusion history     70's, 80's ? hepatitis C attributed to past transfusions  . Hep C w/o coma, chronic     07-20-14 being presently tx. "Harvoni"-good response per pt. will complete in 7days- Dr. Linus Salmons follows [place] [facility].  . Hep B w/o coma   . Decreased mobility     hx. Rt. hip "avascular necrosis" -unable to weight bear long periods, "using wheelchair"..  . Superficial thrombophlebitis     left leg  . Complication of anesthesia   . PONV (postoperative nausea and vomiting)   . Knee stiffness     right knee -hard to bend at knee   Past Surgical History  Procedure Laterality Date  . Abdominal hysterectomy    . Total knee arthroplasty    . Cholecystectomy    . Joint replacement      right knee  .  Arthroscopies      knee-right x 7  . Colonoscopy with propofol N/A 02/26/2015    Procedure: COLONOSCOPY WITH PROPOFOL;  Surgeon: Inda Castle, MD;  Location: [facility];  Service: Endoscopy;  Laterality: N/A;  . Colonoscopy     family history includes Cancer in her father and mother; Diabetes in her brother and mother; Heart disease in her mother; Hypertension in her brother, mother, and sister. There is no history of Colon cancer. Current Outpatient Prescriptions  Medication Sig Dispense Refill  . amLODipine (NORVASC) 5 MG tablet Take 1 tablet (5 mg total) by mouth daily. 90 tablet 3  . diclofenac sodium (VOLTAREN) 1 % GEL Apply 2 g topically 4 (four) times daily. (Patient taking differently: Apply 2 g topically 4 (four) times daily as needed (pain). )  2 Tube 2  . HYDROcodone-acetaminophen (NORCO) 7.5-325 MG per tablet Take 1 tablet by mouth every 8 (eight) hours as needed for moderate pain. May take an extra tablet when pain is sever . No More Than Four a Day 100 tablet 0  . hydrOXYzine (VISTARIL) 50 MG capsule Take 50 mg by mouth at bedtime as needed (to help sleep).    Marland Kitchen ibuprofen (ADVIL,MOTRIN) 200 MG tablet Take 400 mg by mouth every 6 (six) hours as needed for moderate pain.    Marland Kitchen lisinopril (PRINIVIL,ZESTRIL) 40 MG tablet Take 1 tablet (40 mg total) by mouth daily. 90 tablet 3  . methocarbamol (ROBAXIN) 500 MG tablet Take 1 tablet (500 mg total) by mouth 2 times daily at 12 noon and 4 pm. (Patient taking differently: Take 500 mg by mouth 2 (two) times daily as needed for muscle spasms. ) 60 tablet 2  . miconazole (MICATIN) 2 % cream Apply 1 application topically 2 (two) times daily. 28.35 g 0  . ranitidine (ZANTAC) 150 MG capsule Take 150 mg by mouth daily.    . sertraline (ZOLOFT) 100 MG tablet Take 50 mg by mouth every morning.     . [DISCONTINUED] DULoxetine (CYMBALTA) 20 MG capsule Take 60 mg by mouth daily.     No current facility-administered medications for this visit.    Allergies as of 05/28/2015 - Review Complete 05/28/2015  Allergen Reaction Noted  . Penicillins Anaphylaxis 04/29/2009  . Demerol Nausea And Vomiting 10/17/2011    reports that she has been smoking Cigarettes.  She has a 10 pack-year smoking history. She has never used smokeless tobacco. She reports that she does not drink alcohol or use illicit drugs.   Review of Systems: She is not ambulatory because of hip disease Pertinent positive and negative review of systems were noted in the above HPI section. All other review of systems were otherwise negative.  Vital signs were reviewed in today's medical record Physical Exam: General: Obese female in no acute distress examined while sitting in a wheelchair Skin: anicteric Head: Normocephalic and atraumatic Eyes:  sclerae anicteric, EOMI Ears: Normal auditory acuity Mouth: No deformity or lesions Neck: Supple, no masses or thyromegaly Lymph Nodes: no lymphadenopathy Lungs: Clear throughout to auscultation Heart: Regular rate and rhythm; no murmurs, rubs or bruits Gastroinestinal: Soft, non tender and non distended. No masses, hepatosplenomegaly or hernias noted. Normal Bowel sounds Rectal:deferred Musculoskeletal: Symmetrical with no gross deformities  Skin: No lesions on visible extremities Pulses:  Normal pulses noted Extremities: No clubbing, cyanosis,  or deformities noted.  There are chronic venous stasis changes and 1+ ankle edema Neurological: Alert oriented x 4, grossly nonfocal Cervical Nodes:  No significant cervical adenopathy Inguinal Nodes: No significant inguinal adenopathy Psychological:  Alert and cooperative. Normal mood and affect  See Assessment and Plan under Problem List

## 2015-05-31 ENCOUNTER — Other Ambulatory Visit: Payer: Medicare Other

## 2015-05-31 ENCOUNTER — Other Ambulatory Visit: Payer: Self-pay | Admitting: Gastroenterology

## 2015-05-31 DIAGNOSIS — R197 Diarrhea, unspecified: Secondary | ICD-10-CM

## 2015-06-04 LAB — FECAL FAT, QUALITATIVE: Fecal Fat Qualitative: ABNORMAL — AB

## 2015-06-06 ENCOUNTER — Other Ambulatory Visit: Payer: Self-pay

## 2015-06-06 DIAGNOSIS — R197 Diarrhea, unspecified: Secondary | ICD-10-CM

## 2015-06-06 MED ORDER — PANCRELIPASE (LIP-PROT-AMYL) 6000-19000 UNITS PO CPEP: 2.0000 | ORAL_CAPSULE | Freq: Three times a day (TID) | ORAL | Status: DC

## 2015-06-10 ENCOUNTER — Encounter: Payer: Self-pay | Admitting: Registered Nurse

## 2015-06-10 ENCOUNTER — Other Ambulatory Visit: Payer: Medicare Other

## 2015-06-10 ENCOUNTER — Encounter: Payer: Medicare Other | Attending: Physical Medicine and Rehabilitation | Admitting: Registered Nurse

## 2015-06-10 VITALS — BP 120/63 | HR 91

## 2015-06-10 DIAGNOSIS — Z5181 Encounter for therapeutic drug level monitoring: Secondary | ICD-10-CM

## 2015-06-10 DIAGNOSIS — G894 Chronic pain syndrome: Secondary | ICD-10-CM | POA: Diagnosis not present

## 2015-06-10 DIAGNOSIS — M8700 Idiopathic aseptic necrosis of unspecified bone: Secondary | ICD-10-CM

## 2015-06-10 DIAGNOSIS — R197 Diarrhea, unspecified: Secondary | ICD-10-CM

## 2015-06-10 DIAGNOSIS — Z79899 Other long term (current) drug therapy: Secondary | ICD-10-CM

## 2015-06-10 DIAGNOSIS — M25552 Pain in left hip: Secondary | ICD-10-CM

## 2015-06-10 MED ORDER — HYDROCODONE-ACETAMINOPHEN 7.5-325 MG PO TABS: 1.0000 | ORAL_TABLET | Freq: Three times a day (TID) | ORAL | Status: DC | PRN

## 2015-06-11 LAB — CELIAC PANEL 10
Endomysial Screen: NEGATIVE
GLIADIN IGG: 2 U (ref <20)
Gliadin IgA: 5 Units (ref <20)
IgA: 220 mg/dL (ref 69–380)
Tissue Transglut Ab: 1 U/mL (ref <6)
Tissue Transglutaminase Ab, IgA: 1 U/mL (ref <4)

## 2015-06-11 NOTE — Progress Notes (Signed)
Subjective:    Patient ID: [name], female    DOB: 1955/09/29, 59 y.o.   MRN: [name]  HPI: [name] is a 59 year old female who returns for follow up for chronic pain and medication refill. She says her pain is located in her right shoulder, lower back and left hip. She rates her pain 6.Her current exercise regime is performing stretching and chair exercises daily and walking with walker in her home. Arrived in motorized wheelchair.  Pain Inventory Average Pain 7 Pain Right Now 6 My pain is constant, sharp, dull and aching  In the last 24 hours, has pain interfered with the following? General activity 7 Relation with others 7 Enjoyment of life 7 What TIME of day is your pain at its worst? morning Sleep (in general) Fair  Pain is worse with: walking, bending, inactivity and standing Pain improves with: rest and medication Relief from Meds: na  Mobility use a walker how many minutes can you walk? 2 ability to climb steps?  no do you drive?  no use a wheelchair Do you have any goals in this area?  yes  Function disabled: date disabled . I need assistance with the following:  meal prep, household duties and shopping  Neuro/Psych trouble walking  Prior Studies Any changes since last visit?  no  Physicians involved in your care Any changes since last visit?  no   Family History  Problem Relation Age of Onset  . Cancer Mother   . Diabetes Mother   . Heart disease Mother   . Hypertension Mother   . Cancer Father   . Hypertension Sister   . Hypertension Brother   . Diabetes Brother   . Colon cancer Neg Hx    Social History   Social History  . Marital Status: Divorced    Spouse Name: N/A  . Number of Children: N/A  . Years of Education: N/A   Social History Main Topics  . Smoking status: Current Every Day Smoker -- 0.50 packs/day for 20 years    Types: Cigarettes  . Smokeless tobacco: Never Used     Comment: trying to cut back  . Alcohol  Use: No  . Drug Use: No  . Sexual Activity: Not Asked   Other Topics Concern  . None   Social History Narrative   Past Surgical History  Procedure Laterality Date  . Abdominal hysterectomy    . Total knee arthroplasty    . Cholecystectomy    . Joint replacement      right knee  . Arthroscopies      knee-right x 7  . Colonoscopy with propofol N/A 02/26/2015    Procedure: COLONOSCOPY WITH PROPOFOL;  Surgeon: Inda Castle, MD;  Location: [facility];  Service: Endoscopy;  Laterality: N/A;  . Colonoscopy     Past Medical History  Diagnosis Date  . Osteoporosis   . Depression   . Hypertension   . Anemia   . Obesity   . Chronic kidney disease   . Hypothyroid     mild- no medication  . Transfusion history     70's, 80's ? hepatitis C attributed to past transfusions  . Hep C w/o coma, chronic ([place])     07-20-14 being presently tx. "Harvoni"-good response per pt. will complete in 7days- Dr. Linus Salmons follows [place] [facility].  . Hep B w/o coma   . Decreased mobility     hx. Rt. hip "avascular necrosis" -unable  to weight bear long periods, "using wheelchair"..  . Superficial thrombophlebitis     left leg  . Complication of anesthesia   . PONV (postoperative nausea and vomiting)   . Knee stiffness     right knee -hard to bend at knee   BP 120/63 mmHg  Pulse 91  SpO2 98%  Opioid Risk Score:   Fall Risk Score:  `1  Depression screen PHQ 2/9  Depression screen [facility] 2/9 05/14/2015 04/17/2015 12/17/2014 11/20/2014 07/12/2014 07/12/2014 06/06/2014  Decreased Interest 3 2 2 1  0 0 0  Down, Depressed, Hopeless 3 2 2 1  0 0 0  PHQ - 2 Score 6 4 4 2  0 0 0  Altered sleeping - - 1 1 - - -  Tired, decreased energy - - 2 1 - - -  Change in appetite - - 2 0 - - -  Feeling bad or failure about yourself  - - 1 1 - - -  Trouble concentrating - - 1 0 - - -  Moving slowly or fidgety/restless - - 0 0 - - -  Suicidal thoughts - - 0 0 - - -  PHQ-9 Score - - 11 5 - - -     Review of Systems  All other systems reviewed and are negative.      Objective:   Physical Exam  Constitutional: She is oriented to person, place, and time. She appears well-developed and well-nourished.  HENT:  Head: Normocephalic and atraumatic.  Neck: Normal range of motion. Neck supple.  Cardiovascular: Normal rate and regular rhythm.   Pulmonary/Chest: Effort normal and breath sounds normal.  Musculoskeletal:  Normal Muscle Bulk and Muscle Testing Reveals: Upper Extremities: Full ROM and Muscle Strength 5/5 Thoracic and Lumbar Hypersensitivity Lower Extremities: Left: Full ROM and Muscle Strength 5/5 Right: Decreased ROM and Muscle Strength 5/5 Arrived in Motorized wheelchair   Neurological: She is alert and oriented to person, place, and time.  Skin: Skin is warm and dry.  Psychiatric: She has a normal mood and affect.  Nursing note and vitals reviewed.         Assessment & Plan:  1. Chronic right hip pain due to AVN with joint collapse: Continue oxycodone tid, Voltaren gel and Robaxin prn.  Refilled: Hydrocodone 7.5 mg -use one tablet every 8 hours as needed for moderate pain. May take an extra tablet when pain is sever.  #110. 2. Low back pain: Due to RLE leg length discrepancy.  Continue with current medication regime and heat therapy. 3. Balance deficits with poor endurance: Continue using her motorized wheelchair to increse her activity.  4. Left Hip Pain: Continue with heat and exercise and voltaren gel regimen.  20 minutes of face to face patient care time was spent during this visit. All questions were encouraged and answered.   F/U in 1 month

## 2015-06-17 ENCOUNTER — Telehealth: Payer: Self-pay | Admitting: Gastroenterology

## 2015-06-18 NOTE — Telephone Encounter (Signed)
Spoke with the patient and reviewed her Celiac panel results. She is still on Creon as ordered by Dr Deatra Ina. She reports it has helped a little, but she still has diarrhea. She thinks it may be causing her to feel bad. She is scheduled with Dr Silverio Decamp 07/17/15. Is there anything you would advise her to do at this time?

## 2015-06-19 NOTE — Telephone Encounter (Signed)
Please advise her to continue Creon. Limit NSAID's. If she continues to have persistent diarrhea, will have to consider doing colonoscopy to r/o microscopic colitis

## 2015-06-19 NOTE — Telephone Encounter (Signed)
Discussed with the patient. She agrees to this plan of care. She will call us if she acutely worsens.

## 2015-07-09 ENCOUNTER — Encounter: Payer: Self-pay | Admitting: Registered Nurse

## 2015-07-09 ENCOUNTER — Encounter: Payer: Medicare Other | Attending: Physical Medicine and Rehabilitation | Admitting: Registered Nurse

## 2015-07-09 VITALS — BP 108/68 | HR 93 | Resp 16

## 2015-07-09 DIAGNOSIS — M8700 Idiopathic aseptic necrosis of unspecified bone: Secondary | ICD-10-CM

## 2015-07-09 DIAGNOSIS — M25552 Pain in left hip: Secondary | ICD-10-CM | POA: Diagnosis not present

## 2015-07-09 DIAGNOSIS — M2170 Unequal limb length (acquired), unspecified site: Secondary | ICD-10-CM

## 2015-07-09 DIAGNOSIS — M545 Low back pain, unspecified: Secondary | ICD-10-CM

## 2015-07-09 DIAGNOSIS — M25551 Pain in right hip: Secondary | ICD-10-CM | POA: Diagnosis not present

## 2015-07-09 DIAGNOSIS — G894 Chronic pain syndrome: Secondary | ICD-10-CM | POA: Diagnosis not present

## 2015-07-09 DIAGNOSIS — M1611 Unilateral primary osteoarthritis, right hip: Secondary | ICD-10-CM

## 2015-07-09 MED ORDER — HYDROCODONE-ACETAMINOPHEN 7.5-325 MG PO TABS: 1.0000 | ORAL_TABLET | Freq: Three times a day (TID) | ORAL | Status: DC | PRN

## 2015-07-09 NOTE — Progress Notes (Signed)
Subjective:    Patient ID: [name], female    DOB: 01-24-1956, 59 y.o.   MRN: [name]  HPI: [name] is a 59 year old female who returns for follow up for chronic pain and medication refill. She says her pain is located in her right shoulder, lower back and left hip. She rates her pain 7.Her current exercise regime is performing stretching and chair exercises daily and walking with walker in her home. Arrived in motorized wheelchair. Also states a few weeks ago her kitten nicked her leg and she was bleeding, family helped her  Cleaned up the area. She didn't seek medical attention.  Pain Inventory Average Pain 8 Pain Right Now 7 My pain is NA  In the last 24 hours, has pain interfered with the following? General activity 8 Relation with others 8 Enjoyment of life 9 What TIME of day is your pain at its worst? morning Sleep (in general) Fair  Pain is worse with: walking, bending, inactivity and standing Pain improves with: rest and medication Relief from Meds: 5  Mobility ability to climb steps?  no use a wheelchair needs help with transfers Do you have any goals in this area?  yes  Function Do you have any goals in this area?  no  Neuro/Psych No problems in this area  Prior Studies Any changes since last visit?  no  Physicians involved in your care Any changes since last visit?  no   Family History  Problem Relation Age of Onset  . Cancer Mother   . Diabetes Mother   . Heart disease Mother   . Hypertension Mother   . Cancer Father   . Hypertension Sister   . Hypertension Brother   . Diabetes Brother   . Colon cancer Neg Hx    Social History   Social History  . Marital Status: Divorced    Spouse Name: N/A  . Number of Children: N/A  . Years of Education: N/A   Social History Main Topics  . Smoking status: Current Every Day Smoker -- 0.50 packs/day for 20 years    Types: Cigarettes  . Smokeless tobacco: Never Used     Comment: trying to  cut back  . Alcohol Use: No  . Drug Use: No  . Sexual Activity: Not Asked   Other Topics Concern  . None   Social History Narrative   Past Surgical History  Procedure Laterality Date  . Abdominal hysterectomy    . Total knee arthroplasty    . Cholecystectomy    . Joint replacement      right knee  . Arthroscopies      knee-right x 7  . Colonoscopy with propofol N/A 02/26/2015    Procedure: COLONOSCOPY WITH PROPOFOL;  Surgeon: Inda Castle, MD;  Location: [facility];  Service: Endoscopy;  Laterality: N/A;  . Colonoscopy     Past Medical History  Diagnosis Date  . Osteoporosis   . Depression   . Hypertension   . Anemia   . Obesity   . Chronic kidney disease   . Hypothyroid     mild- no medication  . Transfusion history     70's, 80's ? hepatitis C attributed to past transfusions  . Hep C w/o coma, chronic ([place])     07-20-14 being presently tx. "Harvoni"-good response per pt. will complete in 7days- Dr. Linus Salmons follows [place] [facility].  . Hep B w/o coma   . Decreased mobility  hx. Rt. hip "avascular necrosis" -unable to weight bear long periods, "using wheelchair"..  . Superficial thrombophlebitis     left leg  . Complication of anesthesia   . PONV (postoperative nausea and vomiting)   . Knee stiffness     right knee -hard to bend at knee   BP 108/68 mmHg  Pulse 93  Resp 16  SpO2 98%  Opioid Risk Score:   Fall Risk Score:  `1  Depression screen PHQ 2/9  Depression screen [facility] 2/9 05/14/2015 04/17/2015 12/17/2014 11/20/2014 07/12/2014 07/12/2014 06/06/2014  Decreased Interest 3 2 2 1  0 0 0  Down, Depressed, Hopeless 3 2 2 1  0 0 0  PHQ - 2 Score 6 4 4 2  0 0 0  Altered sleeping - - 1 1 - - -  Tired, decreased energy - - 2 1 - - -  Change in appetite - - 2 0 - - -  Feeling bad or failure about yourself  - - 1 1 - - -  Trouble concentrating - - 1 0 - - -  Moving slowly or fidgety/restless - - 0 0 - - -  Suicidal thoughts - - 0 0 - - -    PHQ-9 Score - - 11 5 - - -     Review of Systems  All other systems reviewed and are negative.      Objective:   Physical Exam  Constitutional: She is oriented to person, place, and time. She appears well-developed and well-nourished.  HENT:  Head: Normocephalic and atraumatic.  Neck: Normal range of motion. Neck supple.  Cardiovascular: Normal rate and regular rhythm.   Pulmonary/Chest: Effort normal and breath sounds normal.  Musculoskeletal:  Normal Muscle Bulk and Muscle Testing Reveals: Upper Extremities: Full ROM and Muscle Strength 5/5 Lumbar Paraspinal Tenderness: L-3- L-5 Lower Extremities: Decreased ROM Right Lower Extremity Flexion Produces Pain into Right Hip and patella and Muscle Strength 4/5 Left Lower Extremity Flexion Produces pain into Patella Muscle Strength 4/5 Arrived in Motorized wheelchair   Neurological: She is alert and oriented to person, place, and time.  Skin: Skin is warm and dry.  Psychiatric: She has a normal mood and affect.  Nursing note and vitals reviewed.         Assessment & Plan:  1. Chronic right hip pain due to AVN with joint collapse: Continue oxycodone tid, Voltaren gel and Robaxin prn.  Refilled: Hydrocodone 7.5 mg -use one tablet every 8 hours as needed for moderate pain. May take an extra tablet when pain is sever.  #110. No more than 4 a day. 2. Low back pain: Due to RLE leg length discrepancy.  Continue with current medication regime and heat therapy. 3. Balance deficits with poor endurance: Continue using her motorized wheelchair to increse her activity.  4. Left Hip Pain: Continue with heat and exercise and voltaren gel regimen.  20 minutes of face to face patient care time was spent during this visit. All questions were encouraged and answered.   F/U in 1 month

## 2015-07-17 ENCOUNTER — Encounter: Payer: Self-pay | Admitting: Gastroenterology

## 2015-07-17 ENCOUNTER — Ambulatory Visit (INDEPENDENT_AMBULATORY_CARE_PROVIDER_SITE_OTHER): Payer: Medicare Other | Admitting: Gastroenterology

## 2015-07-17 VITALS — BP 130/80 | HR 86 | Ht 67.0 in | Wt 330.0 lb

## 2015-07-17 DIAGNOSIS — R197 Diarrhea, unspecified: Secondary | ICD-10-CM | POA: Diagnosis not present

## 2015-07-17 DIAGNOSIS — K9049 Malabsorption due to intolerance, not elsewhere classified: Secondary | ICD-10-CM | POA: Diagnosis not present

## 2015-07-17 NOTE — Patient Instructions (Signed)
Go to the basement for your lab kits today If results are Negative you will need to schedule a Colonoscopy/Endoscopy with Dr Silverio Decamp

## 2015-07-22 NOTE — Progress Notes (Signed)
[name]    [name]    02-20-1956  Primary Care Physician:ADVANI, Vernon Prey, MD   Chief complaint:  Diarrhea  HPI: 59 year old white female here with complaints of chronic diarrhea,  her symptoms started about 6-7 months ago. She is having 3-4 bowel movements per day, soft stool and always has to go postprandial. She is currently drinking ice tea that is likely sweetend and feel it is helping. She has cut back on soda. Colonoscopy in June, 2016 was pertinent for multiple adenomatous polyps which were removed. The patient has a history of hepatitis C which was successfully treated with harvoni.  Stool cultures negative, celiac panel negative. Fecal fat qualitative is positive. No history of weight loss. Denies nausea or vomiting or abdominal pain.   Outpatient Encounter Prescriptions as of 07/17/2015  Medication Sig  . amLODipine (NORVASC) 5 MG tablet Take 1 tablet (5 mg total) by mouth daily.  . diclofenac sodium (VOLTAREN) 1 % GEL Apply 2 g topically 4 (four) times daily. (Patient taking differently: Apply 2 g topically 4 (four) times daily as needed (pain). )  . HYDROcodone-acetaminophen (NORCO) 7.5-325 MG tablet Take 1 tablet by mouth every 8 (eight) hours as needed for moderate pain. May take an extra tablet when pain is sever . No More Than Four a Day  . hydrOXYzine (VISTARIL) 50 MG capsule Take 50 mg by mouth at bedtime as needed (to help sleep).  Marland Kitchen ibuprofen (ADVIL,MOTRIN) 200 MG tablet Take 400 mg by mouth every 6 (six) hours as needed for moderate pain.  Marland Kitchen lisinopril (PRINIVIL,ZESTRIL) 40 MG tablet Take 1 tablet (40 mg total) by mouth daily.  . methocarbamol (ROBAXIN) 500 MG tablet Take 1 tablet (500 mg total) by mouth 2 times daily at 12 noon and 4 pm. (Patient taking differently: Take 500 mg by mouth 2 (two) times daily as needed for muscle spasms. )  . miconazole (MICATIN) 2 % cream Apply 1 application topically 2 (two) times daily.  . ranitidine (ZANTAC) 150  MG capsule Take 150 mg by mouth daily.  . sertraline (ZOLOFT) 100 MG tablet Take 50 mg by mouth every morning.   . [DISCONTINUED] Pancrelipase, Lip-Prot-Amyl, (CREON) 6000 UNITS CPEP Take 2 capsules (12,000 Units total) by mouth 3 (three) times daily with meals. (Patient not taking: Reported on 07/09/2015)   No facility-administered encounter medications on file as of 07/17/2015.    Allergies as of 07/17/2015 - Review Complete 07/17/2015  Allergen Reaction Noted  . Penicillins Anaphylaxis 04/29/2009  . Demerol Nausea And Vomiting 10/17/2011    Past Medical History  Diagnosis Date  . Osteoporosis   . Depression   . Hypertension   . Anemia   . Obesity   . Chronic kidney disease   . Hypothyroid     mild- no medication  . Transfusion history     70's, 80's ? hepatitis C attributed to past transfusions  . Hep C w/o coma, chronic ([place])     07-20-14 being presently tx. "Harvoni"-good response per pt. will complete in 7days- Dr. Linus Salmons follows [place] [facility].  . Hep B w/o coma   . Decreased mobility     hx. Rt. hip "avascular necrosis" -unable to weight bear long periods, "using wheelchair"..  . Superficial thrombophlebitis     left leg  . Complication of anesthesia   . PONV (postoperative nausea and vomiting)   . Knee stiffness     right knee -hard  to bend at knee    Past Surgical History  Procedure Laterality Date  . Abdominal hysterectomy    . Total knee arthroplasty    . Cholecystectomy    . Joint replacement      right knee  . Arthroscopies      knee-right x 7  . Colonoscopy with propofol N/A 02/26/2015    Procedure: COLONOSCOPY WITH PROPOFOL;  Surgeon: Inda Castle, MD;  Location: [facility];  Service: Endoscopy;  Laterality: N/A;  . Colonoscopy      Family History  Problem Relation Age of Onset  . Cancer Mother   . Diabetes Mother   . Heart disease Mother   . Hypertension Mother   . Cancer Father   . Hypertension Sister   .  Hypertension Brother   . Diabetes Brother   . Colon cancer Neg Hx     Social History   Social History  . Marital Status: Divorced    Spouse Name: N/A  . Number of Children: N/A  . Years of Education: N/A   Occupational History  . Not on file.   Social History Main Topics  . Smoking status: Current Every Day Smoker -- 0.50 packs/day for 20 years    Types: Cigarettes  . Smokeless tobacco: Never Used     Comment: trying to cut back  . Alcohol Use: No  . Drug Use: No  . Sexual Activity: Not on file   Other Topics Concern  . Not on file   Social History Narrative      Review of systems: Review of Systems  Constitutional: Negative for fever and chills.  HENT: Negative.   Eyes: Negative for blurred vision.  Respiratory: Negative for cough, shortness of breath and wheezing.   Cardiovascular: Negative for chest pain and palpitations.  Gastrointestinal: as per HPI Genitourinary: Negative for dysuria, urgency, frequency and hematuria.  Musculoskeletal: Negative for myalgias, back pain and joint pain.  Skin: Negative for itching and rash.  Neurological: Negative for dizziness, tremors, focal weakness, seizures and loss of consciousness.  Endo/Heme/Allergies: Negative for environmental allergies.  Psychiatric/Behavioral: Negative for depression, suicidal ideas and hallucinations.  All other systems reviewed and are negative.   Physical Exam: Filed Vitals:   07/17/15 0907  BP: 130/80  Pulse: 86   Gen:      No acute distress, morbidly obese, wheel chair bound HEENT:  EOMI, sclera anicteric Neck:     No masses; no thyromegaly Lungs:    Clear to auscultation bilaterally; normal respiratory effort CV:         Regular rate and rhythm; no murmurs Abd:      + bowel sounds; soft, non-tender; no palpable masses, no distension Ext:    No edema; adequate peripheral perfusion Skin:      Warm and dry; no rash Neuro: alert and oriented x 3 Psych: normal mood and affect  Data  Reviewed:  Reviewed pertinent labs and data as per HPI and discussed with patient  CT abd& pelvis 2015 Cirrhotic changes involving the liver but no worrisome hepatic lesions. No splenomegaly, ascites or significant portal venous collaterals. No esophageal varices.  Small bilateral adrenal gland nodules are likely benign adenomas.  Upper abdominal lymph nodes likely due to cirrhosis and hepatitis-C.  Assessment and Plan/Recommendations:  59 year old female with history of hep C s/p successfully treated with Harvoni, morbid obesity with complaints of chronic diarrhea for the past 6-7 months She had colonoscopy in June 2016, mucosa appeared normal but biopsies were not  obtained at that time Qualitative fecal fat positive We'll check fecal elastase and quantitative fat, if suggestive of pancreatic insufficiency start Creon and also obtain CT pancreatic protocol to evaluate pancreas (was normal in 2015) Advised patient to decrease intake of sweetened iced tea, avoid soda, high fructose corn syrup and artificial sweeteners  K. Denzil Magnuson , MD [phone] Mon-Fri 8a-5p [phone] after 5p, weekends, holidays

## 2015-07-24 ENCOUNTER — Other Ambulatory Visit: Payer: Medicare Other

## 2015-07-24 ENCOUNTER — Ambulatory Visit (HOSPITAL_COMMUNITY)
Admission: RE | Admit: 2015-07-24 | Discharge: 2015-07-24 | Disposition: A | Discharge status: Home / Self Care | Payer: Medicare Other | Source: Ambulatory Visit | Attending: Registered Nurse | Admitting: Registered Nurse

## 2015-07-24 DIAGNOSIS — R197 Diarrhea, unspecified: Secondary | ICD-10-CM | POA: Diagnosis not present

## 2015-07-24 DIAGNOSIS — M25552 Pain in left hip: Secondary | ICD-10-CM | POA: Insufficient documentation

## 2015-07-24 DIAGNOSIS — K9049 Malabsorption due to intolerance, not elsewhere classified: Secondary | ICD-10-CM | POA: Diagnosis not present

## 2015-07-24 DIAGNOSIS — G8929 Other chronic pain: Secondary | ICD-10-CM | POA: Diagnosis not present

## 2015-07-24 DIAGNOSIS — M21851 Other specified acquired deformities of right thigh: Secondary | ICD-10-CM | POA: Diagnosis not present

## 2015-07-24 DIAGNOSIS — M8700 Idiopathic aseptic necrosis of unspecified bone: Secondary | ICD-10-CM | POA: Diagnosis not present

## 2015-07-31 LAB — PANCREATIC ELASTASE, FECAL: PANCREATIC ELASTASE-1, STL: 494 ug/g

## 2015-08-06 ENCOUNTER — Telehealth: Payer: Self-pay

## 2015-08-06 ENCOUNTER — Other Ambulatory Visit: Payer: Self-pay | Admitting: *Deleted

## 2015-08-06 ENCOUNTER — Telehealth: Payer: Self-pay | Admitting: *Deleted

## 2015-08-06 DIAGNOSIS — I10 Essential (primary) hypertension: Secondary | ICD-10-CM

## 2015-08-06 NOTE — Telephone Encounter (Signed)
Patient verified DOB Patient states she received 6 month supply of amlodipine and lisinopril. Patient was transferred to the front desk to schedule an appointment to reestablish care. Patient had no further questions.

## 2015-08-06 NOTE — Telephone Encounter (Signed)
Patient is advised. She states she is to be scheduled for EGD and colonoscopy since the results were negative. Procedures would have to be done at the hospital. Please advise.

## 2015-08-06 NOTE — Telephone Encounter (Signed)
Patient verified DOB  

## 2015-08-06 NOTE — Telephone Encounter (Signed)
-----   Message from Mauri Pole, MD sent at 08/05/2015  4:25 PM EST ----- Please inform patient pancreatic elastase was normal

## 2015-08-06 NOTE — Telephone Encounter (Signed)
Quantitative fecal fat report is pending. Lets wait for the results before we consider repeating colonoscopy. I wouldn't recommend repeat EGD. Thanks

## 2015-08-06 NOTE — Telephone Encounter (Signed)
Patient received a 6 month refill. Patient filled medications amlodipine and lisinopril yesterday.

## 2015-08-07 LAB — FECAL FAT, QUANTITATIVE: Fecal Lipids, 24 hr: 2.3 g/(24.h) (ref <7)

## 2015-08-09 ENCOUNTER — Ambulatory Visit (HOSPITAL_BASED_OUTPATIENT_CLINIC_OR_DEPARTMENT_OTHER): Payer: Medicare Other | Admitting: Physical Medicine & Rehabilitation

## 2015-08-09 ENCOUNTER — Encounter: Payer: Medicare Other | Attending: Physical Medicine and Rehabilitation

## 2015-08-09 ENCOUNTER — Encounter: Payer: Self-pay | Admitting: Physical Medicine & Rehabilitation

## 2015-08-09 ENCOUNTER — Other Ambulatory Visit: Payer: Self-pay | Admitting: Physical Medicine & Rehabilitation

## 2015-08-09 VITALS — BP 144/78 | HR 85 | Resp 14

## 2015-08-09 DIAGNOSIS — G894 Chronic pain syndrome: Secondary | ICD-10-CM | POA: Insufficient documentation

## 2015-08-09 DIAGNOSIS — Z79899 Other long term (current) drug therapy: Secondary | ICD-10-CM | POA: Diagnosis not present

## 2015-08-09 DIAGNOSIS — M8700 Idiopathic aseptic necrosis of unspecified bone: Secondary | ICD-10-CM | POA: Diagnosis not present

## 2015-08-09 DIAGNOSIS — Z5181 Encounter for therapeutic drug level monitoring: Secondary | ICD-10-CM | POA: Diagnosis not present

## 2015-08-09 MED ORDER — OXYCODONE HCL 5 MG PO TABS: 5.0000 mg | ORAL_TABLET | Freq: Four times a day (QID) | ORAL | Status: DC | PRN

## 2015-08-09 NOTE — Patient Instructions (Signed)
Our office will call you if there are any inconsistencies with the urine drug screen

## 2015-08-09 NOTE — Progress Notes (Signed)
Subjective:    Patient ID: [name], female    DOB: 23-May-1956, 59 y.o.   MRN: [id number]  HPI   59 year old female with history of avascular necrosis as well as chronic hip pain. She is walking with a walker inside the house but has been using the power wheelchair outside the home. Since my last visit in April 2016 she's had issues with superficial thrombophlebitis in the groin area. She follows up with Dr. Donnetta Hutching from vascular surgery for this problem. Pain Inventory Average Pain 6 Pain Right Now 7 My pain is sharp, burning and aching  In the last 24 hours, has pain interfered with the following? General activity 8 Relation with others 8 Enjoyment of life 8 What TIME of day is your pain at its worst? morning Sleep (in general) Fair  Pain is worse with: na Pain improves with: rest and medication Relief from Meds: 5  Mobility Do you have any goals in this area?  no  Function Do you have any goals in this area?  no  Neuro/Psych No problems in this area  Prior Studies Any changes since last visit?  no  Physicians involved in your care Any changes since last visit?  no   Family History  Problem Relation Age of Onset  . Cancer Mother   . Diabetes Mother   . Heart disease Mother   . Hypertension Mother   . Cancer Father   . Hypertension Sister   . Hypertension Brother   . Diabetes Brother   . Colon cancer Neg Hx    Social History   Social History  . Marital Status: Divorced    Spouse Name: N/A  . Number of Children: N/A  . Years of Education: N/A   Social History Main Topics  . Smoking status: Current Every Day Smoker -- 0.50 packs/day for 20 years    Types: Cigarettes  . Smokeless tobacco: Never Used     Comment: trying to cut back  . Alcohol Use: No  . Drug Use: No  . Sexual Activity: Not Asked   Other Topics Concern  . None   Social History Narrative   Past Surgical History  Procedure Laterality Date  . Abdominal hysterectomy    . Total  knee arthroplasty    . Cholecystectomy    . Joint replacement      right knee  . Arthroscopies      knee-right x 7  . Colonoscopy with propofol N/A 02/26/2015    Procedure: COLONOSCOPY WITH PROPOFOL;  Surgeon: Inda Castle, MD;  Location: [facility];  Service: Endoscopy;  Laterality: N/A;  . Colonoscopy     Past Medical History  Diagnosis Date  . Osteoporosis   . Depression   . Hypertension   . Anemia   . Obesity   . Chronic kidney disease   . Hypothyroid     mild- no medication  . Transfusion history     70's, 80's ? hepatitis C attributed to past transfusions  . Hep C w/o coma, chronic ([place])     07-20-14 being presently tx. "Harvoni"-good response per pt. will complete in 7days- Dr. Linus Salmons follows  infectious disease control.  . Hep B w/o coma   . Decreased mobility     hx. Rt. hip "avascular necrosis" -unable to weight bear long periods, "using wheelchair"..  . Superficial thrombophlebitis     left leg  . Complication of anesthesia   . PONV (postoperative nausea and vomiting)   .  Knee stiffness     right knee -hard to bend at knee   BP 144/78 mmHg  Pulse 85  Resp 14  SpO2 95%  Opioid Risk Score:   Fall Risk Score:  `1  Depression screen PHQ 2/9  Depression screen [facility] 2/9 05/14/2015 04/17/2015 12/17/2014 11/20/2014 07/12/2014 07/12/2014 06/06/2014  Decreased Interest 3 2 2 1  0 0 0  Down, Depressed, Hopeless 3 2 2 1  0 0 0  PHQ - 2 Score 6 4 4 2  0 0 0  Altered sleeping - - 1 1 - - -  Tired, decreased energy - - 2 1 - - -  Change in appetite - - 2 0 - - -  Feeling bad or failure about yourself  - - 1 1 - - -  Trouble concentrating - - 1 0 - - -  Moving slowly or fidgety/restless - - 0 0 - - -  Suicidal thoughts - - 0 0 - - -  PHQ-9 Score - - 11 5 - - -      Review of Systems  All other systems reviewed and are negative.      Objective:   Physical Exam  Constitutional: She is oriented to person, place, and time. She appears well-developed and  well-nourished.  HENT:  Head: Normocephalic and atraumatic.  Eyes: Conjunctivae and EOM are normal. Pupils are equal, round, and reactive to light.  Neck: Normal range of motion.  Neurological: She is alert and oriented to person, place, and time.  Motor strength is 4 minus bilateral knee extensors and ankle dorsiflexors Upper extremity 5/5  Psychiatric: She has a normal mood and affect.  Nursing note and vitals reviewed.   Right knee flexion 75 right knee extension to full      Assessment & Plan:  1. Chronic hip pain due to AVN, Limited household ambulation. Has been on hydrocodone 10 mg 3-4 tablets per day Has history of hepatitis C and wishes to be on an agent without Tylenol We will change to oxycodone 5 mg 3-4 tablets per day 110 tablets per month  Continue opioid monitoring program. This consists of regular clinic visits, examinations, urine drug screen, pill counts as well as use of [place] controlled substance reporting System. Patient gave a urine sample for urine drug screen which was cool. This was discarded and another sample was taken

## 2015-08-10 LAB — PMP ALCOHOL METABOLITE (ETG): Ethyl Glucuronide (EtG): NEGATIVE ng/mL

## 2015-08-12 ENCOUNTER — Telehealth: Payer: Self-pay | Admitting: Gastroenterology

## 2015-08-13 LAB — PRESCRIPTION MONITORING PROFILE (SOLSTAS)
Amphetamine/Meth: NEGATIVE ng/mL
BARBITURATE SCREEN, URINE: NEGATIVE ng/mL
BENZODIAZEPINE SCREEN, URINE: NEGATIVE ng/mL
BUPRENORPHINE, URINE: NEGATIVE ng/mL
COCAINE METABOLITES: NEGATIVE ng/mL
CREATININE, URINE: 161.74 mg/dL (ref >20.0)
Cannabinoid Scrn, Ur: NEGATIVE ng/mL
Carisoprodol, Urine: NEGATIVE ng/mL
FENTANYL URINE: NEGATIVE ng/mL
MDMA URINE: NEGATIVE ng/mL
MEPERIDINE UR: NEGATIVE ng/mL
METHADONE SCREEN, URINE: NEGATIVE ng/mL
Nitrites, Initial: NEGATIVE ug/mL
OPIATE SCREEN, URINE: NEGATIVE ng/mL
Oxycodone Screen, Ur: NEGATIVE ng/mL
Propoxyphene: NEGATIVE ng/mL
Tapentadol, urine: NEGATIVE ng/mL
Tramadol Scrn, Ur: NEGATIVE ng/mL
ZOLPIDEM, URINE: NEGATIVE ng/mL
pH, Initial: 5.9 pH (ref 4.5–8.9)

## 2015-08-14 NOTE — Progress Notes (Signed)
Urine drug screen for this encounter is exempt because of problems with urine cups from lab. Repeat in Feburary

## 2015-08-26 ENCOUNTER — Ambulatory Visit: Payer: Medicare Other | Attending: Family Medicine | Admitting: Family Medicine

## 2015-08-26 ENCOUNTER — Encounter: Payer: Self-pay | Admitting: Family Medicine

## 2015-08-26 VITALS — BP 120/69 | HR 84 | Temp 98.4°F | Resp 18 | Ht 66.0 in | Wt 330.0 lb

## 2015-08-26 DIAGNOSIS — B182 Chronic viral hepatitis C: Secondary | ICD-10-CM | POA: Insufficient documentation

## 2015-08-26 DIAGNOSIS — F17210 Nicotine dependence, cigarettes, uncomplicated: Secondary | ICD-10-CM | POA: Insufficient documentation

## 2015-08-26 DIAGNOSIS — Z0000 Encounter for general adult medical examination without abnormal findings: Secondary | ICD-10-CM | POA: Diagnosis not present

## 2015-08-26 DIAGNOSIS — E039 Hypothyroidism, unspecified: Secondary | ICD-10-CM | POA: Diagnosis not present

## 2015-08-26 DIAGNOSIS — R197 Diarrhea, unspecified: Secondary | ICD-10-CM | POA: Insufficient documentation

## 2015-08-26 DIAGNOSIS — Z8619 Personal history of other infectious and parasitic diseases: Secondary | ICD-10-CM | POA: Diagnosis not present

## 2015-08-26 DIAGNOSIS — I10 Essential (primary) hypertension: Secondary | ICD-10-CM

## 2015-08-26 DIAGNOSIS — K529 Noninfective gastroenteritis and colitis, unspecified: Secondary | ICD-10-CM | POA: Insufficient documentation

## 2015-08-26 DIAGNOSIS — R7309 Other abnormal glucose: Secondary | ICD-10-CM | POA: Insufficient documentation

## 2015-08-26 LAB — POCT GLYCOSYLATED HEMOGLOBIN (HGB A1C): Hemoglobin A1C: 5.7

## 2015-08-26 MED ORDER — DIPHENOXYLATE-ATROPINE 2.5-0.025 MG PO TABS: 1.0000 | ORAL_TABLET | Freq: Four times a day (QID) | ORAL | Status: DC | PRN

## 2015-08-26 NOTE — Assessment & Plan Note (Signed)
A: chronic diarrhea P; Continue w/u per GI Lomotil for symptom management Cautioned against dry mouth and urinary retention  I will f.u with patient over the phone to determine if lomotil has been helpful

## 2015-08-26 NOTE — Assessment & Plan Note (Signed)
A: hx of hypothyroidism, no treatment P: Repeat TSH Synthroid prn

## 2015-08-26 NOTE — Patient Instructions (Addendum)
Analise was seen today for hypertension.  Diagnoses and all orders for this visit:  Healthcare maintenance -     Flu Vaccine QUAD 36+ mos IM  History of hepatitis C  Hypothyroidism, unspecified hypothyroidism type -     TSH  Chronic diarrhea -     diphenoxylate-atropine (LOMOTIL) 2.5-0.025 MG tablet; Take 1 tablet by mouth 4 (four) times daily as needed for diarrhea or loose stools.  Elevated hemoglobin A1c -     HgB A1c   I will call you in 10-14 days to check to see if lomotil has helped with the diarrhea Excellent job with blood pressure  F/u in 3 months for HTN  Dr. Adrian Blackwater

## 2015-08-26 NOTE — Assessment & Plan Note (Signed)
A: BP well controlled Med: compliant P: continue current regimen

## 2015-08-26 NOTE — Progress Notes (Signed)
F/U HTN Stomach problems Tobacco user 1/2 ppday Pain scale #6 - chronic pain  No suicidal thought in the past two weeks

## 2015-08-26 NOTE — Assessment & Plan Note (Signed)
Repeat A1c still in high normal range

## 2015-08-26 NOTE — Progress Notes (Signed)
Subjective:  Patient ID: [name], female    DOB: July 25, 1956  Age: 59 y.o. MRN: [name]  CC: Hypertension   HPI [name] presents for    1. CHRONIC HYPERTENSION  Disease Monitoring  Blood pressure range: not checking   Chest pain: no   Dyspnea: no   Claudication: no   Medication compliance: yes  Medication Side Effects  Lightheadedness: no   Urinary frequency: no   Edema: yes     2. Diarrhea: x 6 months. Non-bloody stool. It started once she completed Harvoni course.  Constant diarrhea. She is being evaluated by GI doctor. She had colonoscopy done in 02/2015. She had benign polyps. She has watery and oily stools after eating anything. There is no fever, chills or weight loss. She has tried imodium which helped a bit. Drinking sweetened tea helps with diarrhea. Diarrhea has interfered with quality of life.   3. Hypothyroidism: no synthroid. Reports gaining 60 # in 3 years. Has swelling in feet and ankles.   Lab Results  Component Value Date   TSH 4.949* 12/29/2012    Social History  Substance Use Topics  . Smoking status: Current Every Day Smoker -- 0.50 packs/day for 20 years    Types: Cigarettes  . Smokeless tobacco: Never Used     Comment: trying to cut back  . Alcohol Use: No    Outpatient Prescriptions Prior to Visit  Medication Sig Dispense Refill  . amLODipine (NORVASC) 5 MG tablet Take 1 tablet (5 mg total) by mouth daily. 90 tablet 3  . diclofenac sodium (VOLTAREN) 1 % GEL Apply 2 g topically 4 (four) times daily. (Patient taking differently: Apply 2 g topically 4 (four) times daily as needed (pain). ) 2 Tube 2  . HYDROcodone-acetaminophen (NORCO) 7.5-325 MG tablet Take 1 tablet by mouth every 8 (eight) hours as needed for moderate pain. May take an extra tablet when pain is sever . No More Than Four a Day 110 tablet 0  . hydrOXYzine (VISTARIL) 50 MG capsule Take 50 mg by mouth at bedtime as needed (to help sleep).    Marland Kitchen ibuprofen (ADVIL,MOTRIN)  200 MG tablet Take 400 mg by mouth every 6 (six) hours as needed for moderate pain.    Marland Kitchen lisinopril (PRINIVIL,ZESTRIL) 40 MG tablet Take 1 tablet (40 mg total) by mouth daily. 90 tablet 3  . methocarbamol (ROBAXIN) 500 MG tablet Take 1 tablet (500 mg total) by mouth 2 times daily at 12 noon and 4 pm. (Patient taking differently: Take 500 mg by mouth 2 (two) times daily as needed for muscle spasms. ) 60 tablet 2  . miconazole (MICATIN) 2 % cream Apply 1 application topically 2 (two) times daily. 28.35 g 0  . oxyCODONE (OXY IR/ROXICODONE) 5 MG immediate release tablet Take 1 tablet (5 mg total) by mouth every 6 (six) hours as needed for severe pain. One month supply 110 tablet 0  . ranitidine (ZANTAC) 150 MG capsule Take 150 mg by mouth daily.    . sertraline (ZOLOFT) 100 MG tablet Take 50 mg by mouth every morning.      No facility-administered medications prior to visit.    ROS Review of Systems  Constitutional: Negative for fever and chills.  Eyes: Negative for visual disturbance.  Respiratory: Negative for shortness of breath.   Cardiovascular: Positive for leg swelling. Negative for chest pain.  Gastrointestinal: Positive for diarrhea. Negative for abdominal pain and blood in stool.  Musculoskeletal: Negative for back pain and  arthralgias.  Skin: Negative for rash.  Allergic/Immunologic: Negative for immunocompromised state.  Hematological: Negative for adenopathy. Does not bruise/bleed easily.  Psychiatric/Behavioral: Negative for suicidal ideas and dysphoric mood.    Objective:  BP 120/69 mmHg  Pulse 84  Temp(Src) 98.4 F (36.9 C) (Oral)  Resp 18  Ht 5\' 6"  (1.676 m)  Wt 330 lb (149.687 kg)  BMI 53.29 kg/m2  SpO2 97%  BP/Weight 08/26/2015 08/09/2015 [id number]  Systolic BP 123456 [id number]  Diastolic BP 69 78 80  Wt. (Lbs) 330 - 330  BMI 53.29 - 51.67   Physical Exam  Constitutional: She is oriented to person, place, and time. She appears well-developed and well-nourished. No  distress.  Morbidly obese in motorized scooter   HENT:  Head: Normocephalic and atraumatic.  Cardiovascular: Normal rate, regular rhythm, normal heart sounds and intact distal pulses.   Pulmonary/Chest: Effort normal and breath sounds normal.  Musculoskeletal: She exhibits edema (trace in feet and ankles ).  Neurological: She is alert and oriented to person, place, and time.  Skin: Skin is warm and dry. No rash noted.  Psychiatric: She has a normal mood and affect.   Lab Results  Component Value Date   HGBA1C 5.70 08/26/2015    Assessment & Plan:   Problem List Items Addressed This Visit    Chronic diarrhea (Chronic)    A: chronic diarrhea P; Continue w/u per GI Lomotil for symptom management Cautioned against dry mouth and urinary retention  I will f.u with patient over the phone to determine if lomotil has been helpful       Relevant Medications   diphenoxylate-atropine (LOMOTIL) 2.5-0.025 MG tablet   Elevated hemoglobin A1c   Relevant Orders   HgB A1c (Completed)   History of hepatitis C (Chronic)   HTN (hypertension) (Chronic)    A: BP well controlled Med: compliant P: continue current regimen       Hypothyroidism (Chronic)    A: hx of hypothyroidism, no treatment P: Repeat TSH Synthroid prn       Relevant Orders   TSH    Other Visit Diagnoses    Healthcare maintenance    -  Primary    Relevant Orders    Flu Vaccine QUAD 36+ mos IM (Completed)       No orders of the defined types were placed in this encounter.    Follow-up: No Follow-up on file.   Boykin Nearing MD

## 2015-08-27 ENCOUNTER — Telehealth: Payer: Self-pay | Admitting: *Deleted

## 2015-08-27 LAB — TSH: TSH: 5.567 u[IU]/mL — ABNORMAL HIGH (ref 0.350–4.500)

## 2015-08-27 MED ORDER — LEVOTHYROXINE SODIUM 50 MCG PO TABS: 50.0000 ug | ORAL_TABLET | Freq: Every day | ORAL | Status: DC

## 2015-08-27 NOTE — Telephone Encounter (Signed)
-----   Message from Boykin Nearing, MD sent at 08/27/2015  8:43 AM EST ----- TSH elevated Start synthroid for hypothyroidism 50 mcg in AM on empty stomach

## 2015-08-27 NOTE — Telephone Encounter (Signed)
Date of birth verified by pt  Lab results given  Elevated TSH, Synthroid 53mcg send to pharmacy  Pt verbalized understanding

## 2015-08-27 NOTE — Addendum Note (Signed)
Addended by: Boykin Nearing on: 08/27/2015 08:44 AM   Modules accepted: Orders, SmartSet

## 2015-09-06 ENCOUNTER — Encounter: Payer: Medicare Other | Admitting: Registered Nurse

## 2015-09-11 ENCOUNTER — Encounter: Payer: Medicare Other | Attending: Physical Medicine and Rehabilitation | Admitting: Registered Nurse

## 2015-09-11 ENCOUNTER — Encounter: Payer: Self-pay | Admitting: Registered Nurse

## 2015-09-11 VITALS — BP 126/75 | HR 92

## 2015-09-11 DIAGNOSIS — M545 Low back pain, unspecified: Secondary | ICD-10-CM

## 2015-09-11 DIAGNOSIS — Z5181 Encounter for therapeutic drug level monitoring: Secondary | ICD-10-CM

## 2015-09-11 DIAGNOSIS — Z79899 Other long term (current) drug therapy: Secondary | ICD-10-CM

## 2015-09-11 DIAGNOSIS — M25552 Pain in left hip: Secondary | ICD-10-CM

## 2015-09-11 DIAGNOSIS — M1611 Unilateral primary osteoarthritis, right hip: Secondary | ICD-10-CM

## 2015-09-11 DIAGNOSIS — G894 Chronic pain syndrome: Secondary | ICD-10-CM | POA: Diagnosis not present

## 2015-09-11 DIAGNOSIS — M24551 Contracture, right hip: Secondary | ICD-10-CM | POA: Diagnosis not present

## 2015-09-11 DIAGNOSIS — M8700 Idiopathic aseptic necrosis of unspecified bone: Secondary | ICD-10-CM

## 2015-09-11 DIAGNOSIS — M2170 Unequal limb length (acquired), unspecified site: Secondary | ICD-10-CM

## 2015-09-11 MED ORDER — OXYCODONE HCL 5 MG PO TABS
5.0000 mg | ORAL_TABLET | Freq: Four times a day (QID) | ORAL | Status: DC | PRN
Start: 2015-09-11 — End: 2015-10-09

## 2015-09-11 NOTE — Progress Notes (Signed)
Subjective:    Patient ID: [name], female    DOB: 1956-08-03, 60 y.o.   MRN: [name]  HPI: [name] is a 60 year old female who returns for follow up for chronic pain and medication refill. She says her pain is located in her right shoulder, lower back and left hip. She rates her pain 6. Her current exercise regime is performing stretching exercises, chair exercises daily and walking with walker in her home. Arrived in motorized wheelchair.  Pain Inventory Average Pain 7 Pain Right Now 6 My pain is intermittent and constant  In the last 24 hours, has pain interfered with the following? General activity 8 Relation with others 8 Enjoyment of life 9 What TIME of day is your pain at its worst? morning Sleep (in general) NA  Pain is worse with: walking, bending and standing Pain improves with: rest and medication Relief from Meds: 7  Mobility walk with assistance ability to climb steps?  no do you drive?  no use a wheelchair  Function disabled: date disabled .  Neuro/Psych No problems in this area  Prior Studies Any changes since last visit?  no  Physicians involved in your care Any changes since last visit?  no   Family History  Problem Relation Age of Onset  . Cancer Mother   . Diabetes Mother   . Heart disease Mother   . Hypertension Mother   . Cancer Father   . Hypertension Sister   . Hypertension Brother   . Diabetes Brother   . Colon cancer Neg Hx    Social History   Social History  . Marital Status: Divorced    Spouse Name: N/A  . Number of Children: N/A  . Years of Education: N/A   Social History Main Topics  . Smoking status: Current Every Day Smoker -- 0.50 packs/day for 20 years    Types: Cigarettes  . Smokeless tobacco: Never Used     Comment: trying to cut back  . Alcohol Use: No  . Drug Use: No  . Sexual Activity: Not Asked   Other Topics Concern  . None   Social History Narrative   Past Surgical History  Procedure  Laterality Date  . Abdominal hysterectomy    . Total knee arthroplasty    . Cholecystectomy    . Joint replacement      right knee  . Arthroscopies      knee-right x 7  . Colonoscopy with propofol N/A 02/26/2015    Procedure: COLONOSCOPY WITH PROPOFOL;  Surgeon: Inda Castle, MD;  Location: [facility];  Service: Endoscopy;  Laterality: N/A;  . Colonoscopy     Past Medical History  Diagnosis Date  . Osteoporosis   . Depression   . Hypertension   . Anemia   . Obesity   . Chronic kidney disease   . Hypothyroid     mild- no medication  . Transfusion history     70's, 80's ? hepatitis C attributed to past transfusions  . Hep C w/o coma, chronic ([place])     07-20-14 being presently tx. "Harvoni"-good response per pt. will complete in 7days- Dr. Linus Salmons follows [place] [facility] disease [facility].  . Hep B w/o coma   . Decreased mobility     hx. Rt. hip "avascular necrosis" -unable to weight bear long periods, "using wheelchair"..  . Superficial thrombophlebitis     left leg  . Complication of anesthesia   . PONV (postoperative nausea and  vomiting)   . Knee stiffness     right knee -hard to bend at knee   BP 126/75 mmHg  Pulse 92  SpO2 97%  Opioid Risk Score:   Fall Risk Score:  `1  Depression screen PHQ 2/9  Depression screen [facility] 2/9 09/11/2015 08/26/2015 05/14/2015 04/17/2015 12/17/2014 11/20/2014 07/12/2014  Decreased Interest 2 0 3 2 2 1  0  Down, Depressed, Hopeless 2 0 3 2 2 1  0  PHQ - 2 Score 4 0 6 4 4 2  0  Altered sleeping - - - - 1 1 -  Tired, decreased energy - - - - 2 1 -  Change in appetite - - - - 2 0 -  Feeling bad or failure about yourself  - - - - 1 1 -  Trouble concentrating - - - - 1 0 -  Moving slowly or fidgety/restless - - - - 0 0 -  Suicidal thoughts - - - - 0 0 -  PHQ-9 Score - - - - 11 5 -     Review of Systems  All other systems reviewed and are negative.      Objective:   Physical Exam  Constitutional: She is oriented to person, place,  and time. She appears well-developed and well-nourished.  HENT:  Head: Normocephalic and atraumatic.  Neck: Normal range of motion. Neck supple.  Cardiovascular: Normal rate and regular rhythm.   Pulmonary/Chest: Effort normal and breath sounds normal.  Musculoskeletal:  Normal Muscle Bulk and Muscle Testing Reveals: Upper Extremities: Full ROM and Muscle Strength 5/5 Lumbar Paraspinal Tenderness: L-3- L-5 Lower Extremities: Right: Decreased ROM and Muscle  Strength 4/5 Left: Full ROM and Muscle Strength 5/5Arrived in Motorized wheelchair  Neurological: She is alert and oriented to person, place, and time.  Skin: Skin is warm and dry.  Psychiatric: She has a normal mood and affect.  Nursing note and vitals reviewed.         Assessment & Plan:  1. Chronic right hip pain due to AVN with joint collapse: Continue oxycodone tid, Voltaren gel and Robaxin prn.  Refilled: Oxycodone 5 mg -use one tablet every 6 hours as needed for moderate pain #110. No more than 4 a day. 2. Low back pain: Due to RLE leg length discrepancy.  Continue with current medication regime and heat therapy. 3. Balance deficits with poor endurance: Continue using her motorized wheelchair to increse her activity.  4. Left Hip Pain: Continue with heat and exercise and voltaren gel regimen.  20 minutes of face to face patient care time was spent during this visit. All questions were encouraged and answered.   F/U in 1 month

## 2015-09-24 ENCOUNTER — Telehealth: Payer: Self-pay | Admitting: Family Medicine

## 2015-09-24 NOTE — Telephone Encounter (Signed)
-----   Message from Boykin Nearing, MD sent at 08/27/2015  8:44 AM EST ----- Regarding: call patient Call patient to f.u lomotil response next week

## 2015-09-24 NOTE — Telephone Encounter (Signed)
Please call patient to ask if lomotil, prescribed at last OV,  has helped with her diarrhea. Please also apologize for me, I intended to call her two weeks ago to check.

## 2015-09-27 NOTE — Telephone Encounter (Signed)
LVM to return call.

## 2015-10-08 ENCOUNTER — Telehealth: Payer: Self-pay | Admitting: Internal Medicine

## 2015-10-08 DIAGNOSIS — K529 Noninfective gastroenteritis and colitis, unspecified: Secondary | ICD-10-CM

## 2015-10-08 DIAGNOSIS — E039 Hypothyroidism, unspecified: Secondary | ICD-10-CM

## 2015-10-08 NOTE — Telephone Encounter (Signed)
Patient called request medication refill on diphenoxylate-atropine (LOMOTIL) 2.5-0.025 MG tablet, and thyroid medication  patient would like medication sent to pharmacy on file.

## 2015-10-09 ENCOUNTER — Encounter: Payer: Medicare Other | Attending: Physical Medicine and Rehabilitation | Admitting: Registered Nurse

## 2015-10-09 ENCOUNTER — Encounter: Payer: Self-pay | Admitting: Registered Nurse

## 2015-10-09 VITALS — BP 109/61

## 2015-10-09 DIAGNOSIS — G894 Chronic pain syndrome: Secondary | ICD-10-CM | POA: Diagnosis not present

## 2015-10-09 DIAGNOSIS — M8700 Idiopathic aseptic necrosis of unspecified bone: Secondary | ICD-10-CM | POA: Diagnosis not present

## 2015-10-09 DIAGNOSIS — M545 Low back pain, unspecified: Secondary | ICD-10-CM

## 2015-10-09 DIAGNOSIS — Z79899 Other long term (current) drug therapy: Secondary | ICD-10-CM

## 2015-10-09 DIAGNOSIS — Z5181 Encounter for therapeutic drug level monitoring: Secondary | ICD-10-CM | POA: Diagnosis not present

## 2015-10-09 DIAGNOSIS — M25551 Pain in right hip: Secondary | ICD-10-CM

## 2015-10-09 DIAGNOSIS — M24551 Contracture, right hip: Secondary | ICD-10-CM

## 2015-10-09 MED ORDER — OXYCODONE HCL 5 MG PO TABS: 5.0000 mg | ORAL_TABLET | Freq: Four times a day (QID) | ORAL | Status: DC | PRN

## 2015-10-09 NOTE — Progress Notes (Signed)
Subjective:    Patient ID: [name], female    DOB: 1956/04/11, 60 y.o.   MRN: [name]  [id number] [name] is a 60 year old female who returns for follow up for chronic pain and medication refill. She says her pain is located in her right shoulder, lower back and right hip. She rates her pain 6. She hasn't followed her usual current exercise regime for the last few weeks she had the stomach virus she states. She will be resuming her exercise regime next week such as performing chair exercises daily and walking with walker in her home. Arrived in motorized wheelchair.  Pain Inventory Average Pain 7 Pain Right Now 6 My pain is sharp, stabbing and aching  In the last 24 hours, has pain interfered with the following? General activity 8 Relation with others 8 Enjoyment of life 8 What TIME of day is your pain at its worst? morning Sleep (in general) Fair  Pain is worse with: walking, bending, sitting, inactivity and standing Pain improves with: rest and medication Relief from Meds: 6  Mobility use a walker ability to climb steps?  no do you drive?  no  Function disabled: date disabled .  Neuro/Psych No problems in this area  Prior Studies Any changes since last visit?  no  Physicians involved in your care Any changes since last visit?  no   Family History  Problem Relation Age of Onset  . Cancer Mother   . Diabetes Mother   . Heart disease Mother   . Hypertension Mother   . Cancer Father   . Hypertension Sister   . Hypertension Brother   . Diabetes Brother   . Colon cancer Neg Hx    Social History   Social History  . Marital Status: Divorced    Spouse Name: N/A  . Number of Children: N/A  . Years of Education: N/A   Social History Main Topics  . Smoking status: Current Every Day Smoker -- 0.50 packs/day for 20 years    Types: Cigarettes  . Smokeless tobacco: Never Used     Comment: trying to cut back  . Alcohol Use: No  . Drug Use: No  . Sexual  Activity: Not Asked   Other Topics Concern  . None   Social History Narrative   Past Surgical History  Procedure Laterality Date  . Abdominal hysterectomy    . Total knee arthroplasty    . Cholecystectomy    . Joint replacement      right knee  . Arthroscopies      knee-right x 7  . Colonoscopy with propofol N/A 02/26/2015    Procedure: COLONOSCOPY WITH PROPOFOL;  Surgeon: Inda Castle, MD;  Location: [facility];  Service: Endoscopy;  Laterality: N/A;  . Colonoscopy     Past Medical History  Diagnosis Date  . Osteoporosis   . Depression   . Hypertension   . Anemia   . Obesity   . Chronic kidney disease   . Hypothyroid     mild- no medication  . Transfusion history     70's, 80's ? hepatitis C attributed to past transfusions  . Hep C w/o coma, chronic ([place])     07-20-14 being presently tx. "Harvoni"-good response per pt. will complete in 7days- Dr. Linus Salmons follows [place] infectious disease control.  . Hep B w/o coma   . Decreased mobility     hx. Rt. hip "avascular necrosis" -unable to weight bear long periods, "using  wheelchair"..  . Superficial thrombophlebitis     left leg  . Complication of anesthesia   . PONV (postoperative nausea and vomiting)   . Knee stiffness     right knee -hard to bend at knee   BP   109/61 Opioid Risk Score:   Fall Risk Score:  `1  Depression screen PHQ 2/9  Depression screen [facility] 2/9 10/09/2015 09/11/2015 08/26/2015 05/14/2015 04/17/2015 12/17/2014 11/20/2014  Decreased Interest 2 2 0 3 2 2 1   Down, Depressed, Hopeless 2 2 0 3 2 2 1   PHQ - 2 Score 4 4 0 6 4 4 2   Altered sleeping - - - - - 1 1  Tired, decreased energy - - - - - 2 1  Change in appetite - - - - - 2 0  Feeling bad or failure about yourself  - - - - - 1 1  Trouble concentrating - - - - - 1 0  Moving slowly or fidgety/restless - - - - - 0 0  Suicidal thoughts - - - - - 0 0  PHQ-9 Score - - - - - 11 5    Review of Systems  All other systems reviewed and are  negative.      Objective:   Physical Exam  Constitutional: She is oriented to person, place, and time. She appears well-developed and well-nourished.  HENT:  Head: Normocephalic and atraumatic.  Neck: Normal range of motion. Neck supple.  Cardiovascular: Normal rate and regular rhythm.   Pulmonary/Chest: Effort normal and breath sounds normal.  Musculoskeletal:  Normal Muscle Bulk and Muscle Testing Reveals: Upper Extremities: Full ROM and Muscle Strength 5/5 Thoracic Tightness Lumbar Paraspinal Tenderness: L-3- L-5 Lower Extremities: Right: Decreased ROM and Muscle Strength 5/5 Right Lower Extremity Flexion Produces Pain into Lumbar,Right Hip and Lower Extremity Left: Full ROM and Muscle Strength 5/5 Left Lower Extremity Flexion Produces Pain into Patella and  lower Extremity laterally Arrived in wheelchair   Neurological: She is alert and oriented to person, place, and time.  Skin: Skin is warm and dry.  Psychiatric: She has a normal mood and affect.  Nursing note and vitals reviewed.         Assessment & Plan:  1. Chronic right hip pain due to AVN with joint collapse: Continue oxycodone, Voltaren gel and Robaxin prn.  Refilled: Oxycodone 5 mg -use one tablet every 6 hours as needed for moderate pain #110. No more than 4 a day. 2. Low back pain: Due to RLE leg length discrepancy.  Continue with current medication regime and heat therapy. 3. Balance deficits with poor endurance: Continue using her motorized wheelchair to increse her activity.  4. Right Hip Pain: Continue with heat and exercise and voltaren gel regimen.  20 minutes of face to face patient care time was spent during this visit. All questions were encouraged and answered.   F/U in 1 month

## 2015-10-10 MED ORDER — DIPHENOXYLATE-ATROPINE 2.5-0.025 MG PO TABS: 1.0000 | ORAL_TABLET | Freq: Four times a day (QID) | ORAL | Status: DC | PRN

## 2015-10-10 NOTE — Telephone Encounter (Signed)
Lomotil refilled, rx will need to be picked up for faxed  She has refills on synthroid, call her pharmacy  Repeat TSH due in 3-7 weeks, it has been ordered, future order

## 2015-10-10 NOTE — Telephone Encounter (Signed)
Pt aware, bring picture ID to pick up Advised to call pharmacy for refills  Need Lab appointment

## 2015-10-23 ENCOUNTER — Ambulatory Visit: Payer: Medicare Other | Attending: Family Medicine

## 2015-10-23 DIAGNOSIS — E039 Hypothyroidism, unspecified: Secondary | ICD-10-CM | POA: Diagnosis not present

## 2015-10-24 ENCOUNTER — Other Ambulatory Visit: Payer: Self-pay | Admitting: Family Medicine

## 2015-10-24 DIAGNOSIS — E039 Hypothyroidism, unspecified: Secondary | ICD-10-CM

## 2015-10-24 LAB — TSH: TSH: 2.91 mIU/L

## 2015-10-24 MED ORDER — LEVOTHYROXINE SODIUM 50 MCG PO TABS: 50.0000 ug | ORAL_TABLET | Freq: Every day | ORAL | Status: DC

## 2015-11-06 ENCOUNTER — Encounter: Payer: Medicare Other | Admitting: Registered Nurse

## 2015-11-06 ENCOUNTER — Other Ambulatory Visit: Payer: Self-pay | Admitting: *Deleted

## 2015-11-06 MED ORDER — OXYCODONE HCL 5 MG PO TABS: 5.0000 mg | ORAL_TABLET | Freq: Four times a day (QID) | ORAL | Status: DC | PRN

## 2015-12-04 ENCOUNTER — Encounter: Payer: Self-pay | Admitting: Registered Nurse

## 2015-12-04 ENCOUNTER — Encounter: Payer: Medicare Other | Attending: Physical Medicine and Rehabilitation | Admitting: Registered Nurse

## 2015-12-04 VITALS — BP 102/71 | HR 72 | Resp 14

## 2015-12-04 DIAGNOSIS — G894 Chronic pain syndrome: Secondary | ICD-10-CM | POA: Diagnosis not present

## 2015-12-04 DIAGNOSIS — M545 Low back pain, unspecified: Secondary | ICD-10-CM

## 2015-12-04 DIAGNOSIS — M24551 Contracture, right hip: Secondary | ICD-10-CM

## 2015-12-04 DIAGNOSIS — M8700 Idiopathic aseptic necrosis of unspecified bone: Secondary | ICD-10-CM

## 2015-12-04 DIAGNOSIS — Z79899 Other long term (current) drug therapy: Secondary | ICD-10-CM

## 2015-12-04 DIAGNOSIS — Z5181 Encounter for therapeutic drug level monitoring: Secondary | ICD-10-CM | POA: Diagnosis not present

## 2015-12-04 MED ORDER — DICLOFENAC SODIUM 1 % TD GEL: 2.0000 g | Freq: Four times a day (QID) | TRANSDERMAL | Status: DC | PRN

## 2015-12-04 MED ORDER — OXYCODONE HCL 5 MG PO TABS: 5.0000 mg | ORAL_TABLET | Freq: Four times a day (QID) | ORAL | Status: DC | PRN

## 2015-12-04 NOTE — Progress Notes (Signed)
Subjective:    Patient ID: [name], female    DOB: 03-13-1956, 60 y.o.   MRN: [id number]  HPI: [name] is a 60 year old female who returns for follow up for chronic pain and medication refill. She states her pain is located in her lower back, right hip, right lower extremity and left knee. She rates her pain 6. Her current exercise regime is performing chair exercises daily and walking with her walker in her home. Arrived in motorized wheelchair.  Pain Inventory Average Pain 8 Pain Right Now 6 My pain is constant, sharp, burning, dull, stabbing, tingling and aching  In the last 24 hours, has pain interfered with the following? General activity 9 Relation with others 8 Enjoyment of life 8 What TIME of day is your pain at its worst? morning Sleep (in general) Fair  Pain is worse with: walking, bending, sitting, inactivity and standing Pain improves with: rest and medication Relief from Meds: 5  Mobility walk with assistance use a walker how many minutes can you walk? ? ability to climb steps?  no do you drive?  no use a wheelchair Do you have any goals in this area?  yes  Function disabled: date disabled . I need assistance with the following:  household duties  Neuro/Psych bowel control problems weakness numbness trouble walking spasms dizziness depression anxiety  Prior Studies Any changes since last visit?  no  Physicians involved in your care Any changes since last visit?  no   Family History  Problem Relation Age of Onset  . Cancer Mother   . Diabetes Mother   . Heart disease Mother   . Hypertension Mother   . Cancer Father   . Hypertension Sister   . Hypertension Brother   . Diabetes Brother   . Colon cancer Neg Hx    Social History   Social History  . Marital Status: Divorced    Spouse Name: N/A  . Number of Children: N/A  . Years of Education: N/A   Social History Main Topics  . Smoking status: Current Every Day Smoker --  0.50 packs/day for 20 years    Types: Cigarettes  . Smokeless tobacco: Never Used     Comment: trying to cut back  . Alcohol Use: No  . Drug Use: No  . Sexual Activity: Not Asked   Other Topics Concern  . None   Social History Narrative   Past Surgical History  Procedure Laterality Date  . Abdominal hysterectomy    . Total knee arthroplasty    . Cholecystectomy    . Joint replacement      right knee  . Arthroscopies      knee-right x 7  . Colonoscopy with propofol N/A 02/26/2015    Procedure: COLONOSCOPY WITH PROPOFOL;  Surgeon: Inda Castle, MD;  Location: [facility];  Service: Endoscopy;  Laterality: N/A;  . Colonoscopy     Past Medical History  Diagnosis Date  . Osteoporosis   . Depression   . Hypertension   . Anemia   . Obesity   . Chronic kidney disease   . Hypothyroid     mild- no medication  . Transfusion history     70's, 80's ? hepatitis C attributed to past transfusions  . Hep C w/o coma, chronic ([place])     07-20-14 being presently tx. "Harvoni"-good response per pt. will complete in 7days- Dr. Linus Salmons follows [place] infectious disease control.  . Hep B w/o coma   .  Decreased mobility     hx. Rt. hip "avascular necrosis" -unable to weight bear long periods, "using wheelchair"..  . Superficial thrombophlebitis     left leg  . Complication of anesthesia   . PONV (postoperative nausea and vomiting)   . Knee stiffness     right knee -hard to bend at knee   BP 102/71 mmHg  Pulse 72  Resp 14  SpO2 98%  Opioid Risk Score:   Fall Risk Score:  `1  Depression screen PHQ 2/9  Depression screen [facility] 2/9 10/09/2015 09/11/2015 08/26/2015 05/14/2015 04/17/2015 12/17/2014 11/20/2014  Decreased Interest 2 2 0 3 2 2 1   Down, Depressed, Hopeless 2 2 0 3 2 2 1   PHQ - 2 Score 4 4 0 6 4 4 2   Altered sleeping - - - - - 1 1  Tired, decreased energy - - - - - 2 1  Change in appetite - - - - - 2 0  Feeling bad or failure about yourself  - - - - - 1 1  Trouble  concentrating - - - - - 1 0  Moving slowly or fidgety/restless - - - - - 0 0  Suicidal thoughts - - - - - 0 0  PHQ-9 Score - - - - - 11 5     Review of Systems  All other systems reviewed and are negative.      Objective:   Physical Exam  Constitutional: She is oriented to person, place, and time. She appears well-developed and well-nourished.  HENT:  Head: Normocephalic and atraumatic.  Neck: Normal range of motion. Neck supple.  Cardiovascular: Normal rate and regular rhythm.   Pulmonary/Chest: Effort normal and breath sounds normal.  Musculoskeletal:  Normal Muscle Bulk and Muscle Testing Reveals: Upper Extremities: Full ROM and Muscle Strength 5/5 Thoracic Paraspinal Tenderness: T-1- T-3 Lumbar Paraspinal Tenderness: L-3- L-5 Right Greater Trochanteric Tenderness Lower Extremities: Left: Full ROM and Muscle Strength 5/5 Left Lower Extremity Flexion Produces Pain into Patella Right: Decreased ROM and Muscle Strength 4/5 Right Lower Extremity Flexion Produces Pain into Hip Arrived in motorized wheelchair  Neurological: She is alert and oriented to person, place, and time.  Skin: Skin is warm and dry.  Psychiatric: She has a normal mood and affect.  Nursing note and vitals reviewed.         Assessment & Plan:  1. Chronic right hip pain due to AVN with joint collapse: Continue oxycodone, Voltaren gel and Robaxin prn.  Refilled: Oxycodone 5 mg -use one tablet every 6 hours as needed for moderate pain #110. No more than 4 a day. 2. Low back pain: Due to RLE leg length discrepancy.  Continue with current medication regime and heat therapy. 3. Balance deficits with poor endurance: Continue using her motorized wheelchair to increse her activity.  4. Right Hip Pain: Continue with heat and exercise and voltaren gel regimen.  20 minutes of face to face patient care time was spent during this visit. All questions were encouraged and answered.   F/U in 1 month

## 2015-12-06 NOTE — Telephone Encounter (Signed)
See Previous Encounter

## 2015-12-24 ENCOUNTER — Encounter: Payer: Self-pay | Admitting: Family Medicine

## 2015-12-24 ENCOUNTER — Ambulatory Visit: Payer: Medicare Other | Attending: Family Medicine | Admitting: Family Medicine

## 2015-12-24 VITALS — BP 103/71 | HR 80 | Temp 98.5°F | Resp 16 | Ht 66.0 in | Wt 317.0 lb

## 2015-12-24 DIAGNOSIS — F329 Major depressive disorder, single episode, unspecified: Secondary | ICD-10-CM | POA: Diagnosis not present

## 2015-12-24 DIAGNOSIS — R200 Anesthesia of skin: Secondary | ICD-10-CM | POA: Insufficient documentation

## 2015-12-24 DIAGNOSIS — R202 Paresthesia of skin: Secondary | ICD-10-CM | POA: Diagnosis not present

## 2015-12-24 DIAGNOSIS — E039 Hypothyroidism, unspecified: Secondary | ICD-10-CM | POA: Insufficient documentation

## 2015-12-24 DIAGNOSIS — Z993 Dependence on wheelchair: Secondary | ICD-10-CM | POA: Diagnosis not present

## 2015-12-24 DIAGNOSIS — R197 Diarrhea, unspecified: Secondary | ICD-10-CM | POA: Insufficient documentation

## 2015-12-24 DIAGNOSIS — F32A Depression, unspecified: Secondary | ICD-10-CM | POA: Insufficient documentation

## 2015-12-24 DIAGNOSIS — G8929 Other chronic pain: Secondary | ICD-10-CM | POA: Insufficient documentation

## 2015-12-24 DIAGNOSIS — Z79899 Other long term (current) drug therapy: Secondary | ICD-10-CM | POA: Insufficient documentation

## 2015-12-24 DIAGNOSIS — K529 Noninfective gastroenteritis and colitis, unspecified: Secondary | ICD-10-CM

## 2015-12-24 DIAGNOSIS — F17210 Nicotine dependence, cigarettes, uncomplicated: Secondary | ICD-10-CM | POA: Insufficient documentation

## 2015-12-24 MED ORDER — DIPHENOXYLATE-ATROPINE 2.5-0.025 MG PO TABS: 1.0000 | ORAL_TABLET | Freq: Four times a day (QID) | ORAL | Status: DC | PRN

## 2015-12-24 MED ORDER — SERTRALINE HCL 50 MG PO TABS
50.0000 mg | ORAL_TABLET | Freq: Every day | ORAL | Status: DC
Start: 2015-12-24 — End: 2017-03-25

## 2015-12-24 NOTE — Assessment & Plan Note (Signed)
Numbness in finger tips of L hand.  Suspect neuropathy, cause unclear Check TSH, vit B12 and CBC Patient advised to wear carpal tunnel brace on L hand

## 2015-12-24 NOTE — Patient Instructions (Addendum)
[name] was seen today for hand pain.  Diagnoses and all orders for this visit:  Numbness and tingling in left hand -     Vitamin B12 -     CBC  Chronic diarrhea -     diphenoxylate-atropine (LOMOTIL) 2.5-0.025 MG tablet; Take 1 tablet by mouth 4 (four) times daily as needed for diarrhea or loose stools.  Depression -     sertraline (ZOLOFT) 50 MG tablet; Take 1 tablet (50 mg total) by mouth daily.  taper zoloft from 50 to 25 mg daily. If you notice worsening depression please increase dose back to 50 mg daily. Please wear a carpal tunnel type brace on L hand.   F/u in 12 weeks for L finger tip numbness   Dr. Adrian Blackwater

## 2015-12-24 NOTE — Progress Notes (Signed)
Subjective:  Patient ID: [name], female    DOB: Apr 23, 1956  Age: 60 y.o. MRN: [name]  CC: Numbness   HPI [name] has hypothyroidism she  presents for   1. L finger tip numbness: x 2 months. No injury.  She is R handed. Finger tips on entire hand are numb. Also has intermittent numbness in R hand. No slurred speech facial droop or focal weakness.   2. Depression: improved. Taking zoloft 50 mg daily. She skips a dose some days.  Was diagnosed with depression with she had AVN in hip was was confined to wheelchair. Mood has improved. She has chronic hip pain but is determined to enjoy her life as much as possible. No SI.   3. Diarrhea: improved with lomotil. She has much less stool frequency. She is pleased with the results.   Social History  Substance Use Topics  . Smoking status: Current Every Day Smoker -- 0.50 packs/day for 20 years    Types: Cigarettes  . Smokeless tobacco: Never Used     Comment: trying to cut back  . Alcohol Use: No    Outpatient Prescriptions Prior to Visit  Medication Sig Dispense Refill  . amLODipine (NORVASC) 5 MG tablet Take 1 tablet (5 mg total) by mouth daily. 90 tablet 3  . diclofenac sodium (VOLTAREN) 1 % GEL Apply 2 g topically 4 (four) times daily as needed (pain). 2 Tube 2  . diphenoxylate-atropine (LOMOTIL) 2.5-0.025 MG tablet Take 1 tablet by mouth 4 (four) times daily as needed for diarrhea or loose stools. 30 tablet 2  . hydrOXYzine (VISTARIL) 50 MG capsule Take 50 mg by mouth at bedtime as needed (to help sleep).    Marland Kitchen ibuprofen (ADVIL,MOTRIN) 200 MG tablet Take 400 mg by mouth every 6 (six) hours as needed for moderate pain. Reported on 08/26/2015    . levothyroxine (SYNTHROID, LEVOTHROID) 50 MCG tablet Take 1 tablet (50 mcg total) by mouth daily. 30 tablet 3  . lisinopril (PRINIVIL,ZESTRIL) 40 MG tablet Take 1 tablet (40 mg total) by mouth daily. 90 tablet 3  . methocarbamol (ROBAXIN) 500 MG tablet Take 1 tablet (500 mg total)  by mouth 2 times daily at 12 noon and 4 pm. 60 tablet 2  . oxyCODONE (OXY IR/ROXICODONE) 5 MG immediate release tablet Take 1 tablet (5 mg total) by mouth every 6 (six) hours as needed for severe pain. One month supply 110 tablet 0  . ranitidine (ZANTAC) 150 MG capsule Take 150 mg by mouth daily. Reported on 08/26/2015    . sertraline (ZOLOFT) 100 MG tablet Take 50 mg by mouth every morning.      No facility-administered medications prior to visit.    ROS Review of Systems  Constitutional: Negative for fever and chills.  Eyes: Negative for visual disturbance.  Respiratory: Negative for shortness of breath.   Cardiovascular: Positive for leg swelling. Negative for chest pain.  Gastrointestinal: Negative for abdominal pain, diarrhea and blood in stool.  Musculoskeletal: Negative for back pain and arthralgias.  Skin: Negative for rash.  Allergic/Immunologic: Negative for immunocompromised state.  Neurological: Positive for numbness. Negative for facial asymmetry, speech difficulty and weakness.  Hematological: Negative for adenopathy. Does not bruise/bleed easily.  Psychiatric/Behavioral: Negative for suicidal ideas and dysphoric mood.   Objective:  BP 103/71 mmHg  Pulse 80  Temp(Src) 98.5 F (36.9 C)  Resp 16  Ht 5\' 6"  (1.676 m)  Wt 317 lb (143.79 kg)  BMI 51.19 kg/m2  SpO2 99%  Wt Readings from Last 3 Encounters:  12/24/15 317 lb (143.79 kg)  08/26/15 330 lb (149.687 kg)  07/17/15 330 lb (149.687 kg)     BP/Weight 12/24/2015 [id number]  Systolic BP [id number]  Diastolic BP 71 71 61  Wt. (Lbs) 317 - -  BMI 51.19 - -    Physical Exam  Constitutional: She is oriented to person, place, and time. She appears well-developed and well-nourished. No distress.  Morbidly obese in motorized scooter   HENT:  Head: Normocephalic and atraumatic.  Cardiovascular: Normal rate, regular rhythm, normal heart sounds and intact distal pulses.   Pulses:      Radial pulses are 2+  on the right side, and 2+ on the left side.  Pulmonary/Chest: Effort normal and breath sounds normal.  Musculoskeletal: She exhibits edema (trace in feet and ankles ).       Left hand: Decreased sensation noted. Normal strength noted.  Decreased sensation at all finger tips   Neurological: She is alert and oriented to person, place, and time. She exhibits normal muscle tone.  Skin: Skin is warm and dry. No rash noted.  Psychiatric: She has a normal mood and affect.   Lab Results  Component Value Date   HGBA1C 5.70 08/26/2015     Assessment & Plan:   There are no diagnoses linked to this encounter.  No orders of the defined types were placed in this encounter.    Follow-up: No Follow-up on file.   Boykin Nearing MD

## 2015-12-24 NOTE — Assessment & Plan Note (Signed)
Improved Patient to taper down from 50 mg zoloft to 25 mg zoloft daily.

## 2015-12-24 NOTE — Progress Notes (Signed)
Pt stated lt hand numbness Rt hand numbness on and off  Pain scale #7 chronic pain  Tobacco user 3 cigarette per day  No suicidal thought in the past two weeks

## 2015-12-27 ENCOUNTER — Other Ambulatory Visit: Payer: Medicare Other

## 2016-01-01 ENCOUNTER — Other Ambulatory Visit: Payer: Self-pay | Admitting: *Deleted

## 2016-01-01 ENCOUNTER — Ambulatory Visit: Payer: Medicare Other | Attending: Family Medicine

## 2016-01-01 DIAGNOSIS — E039 Hypothyroidism, unspecified: Secondary | ICD-10-CM

## 2016-01-01 DIAGNOSIS — Z029 Encounter for administrative examinations, unspecified: Secondary | ICD-10-CM | POA: Diagnosis not present

## 2016-01-01 DIAGNOSIS — R202 Paresthesia of skin: Secondary | ICD-10-CM | POA: Diagnosis not present

## 2016-01-01 DIAGNOSIS — R200 Anesthesia of skin: Secondary | ICD-10-CM

## 2016-01-01 NOTE — Patient Instructions (Signed)
Patient is advised of receiving a FU regarding results.

## 2016-01-03 ENCOUNTER — Encounter: Payer: Self-pay | Admitting: Registered Nurse

## 2016-01-03 ENCOUNTER — Encounter: Payer: Medicare Other | Attending: Physical Medicine and Rehabilitation | Admitting: Registered Nurse

## 2016-01-03 VITALS — BP 115/60 | HR 88 | Resp 14

## 2016-01-03 DIAGNOSIS — G894 Chronic pain syndrome: Secondary | ICD-10-CM | POA: Diagnosis not present

## 2016-01-03 DIAGNOSIS — Z79899 Other long term (current) drug therapy: Secondary | ICD-10-CM

## 2016-01-03 DIAGNOSIS — M545 Low back pain, unspecified: Secondary | ICD-10-CM

## 2016-01-03 DIAGNOSIS — M24551 Contracture, right hip: Secondary | ICD-10-CM

## 2016-01-03 DIAGNOSIS — Z5181 Encounter for therapeutic drug level monitoring: Secondary | ICD-10-CM

## 2016-01-03 DIAGNOSIS — M8700 Idiopathic aseptic necrosis of unspecified bone: Secondary | ICD-10-CM | POA: Diagnosis not present

## 2016-01-03 MED ORDER — OXYCODONE HCL 5 MG PO TABS: 5.0000 mg | ORAL_TABLET | Freq: Four times a day (QID) | ORAL | Status: DC | PRN

## 2016-01-03 NOTE — Progress Notes (Signed)
Subjective:    Patient ID: [name], female    DOB: 11/16/1955, 60 y.o.   MRN: [id number]  HPI: [name] is a 60 year old female who returns for follow up for chronic pain and medication refill. She states her pain is located in her lower back, right hip, right lower extremity and left knee. Also states she has been having numbness and tingling in her left hand, states her PCP is following. We discussed the possibility of Neuropathy and prescribing gabapentin. She will follow up with her PCP she states.  She rates her pain 6. Her current exercise regime is performing chair exercises daily, using the bands and walking with her walker in her home. Arrived in motorized wheelchair.  Pain Inventory Average Pain 8 Pain Right Now 6 My pain is sharp, stabbing and aching  In the last 24 hours, has pain interfered with the following? General activity no selection Relation with others no selection Enjoyment of life no selection What TIME of day is your pain at its worst? no selection Sleep (in general) no selection  Pain is worse with: no selection Pain improves with: no selection Relief from Meds: no selection  Mobility use a walker ability to climb steps?  no do you drive?  no use a wheelchair Do you have any goals in this area?  yes  Function disabled: date disabled .  Neuro/Psych No problems in this area  Prior Studies Any changes since last visit?  no  Physicians involved in your care Any changes since last visit?  no   Family History  Problem Relation Age of Onset  . Cancer Mother   . Diabetes Mother   . Heart disease Mother   . Hypertension Mother   . Cancer Father   . Hypertension Sister   . Hypertension Brother   . Diabetes Brother   . Colon cancer Neg Hx    Social History   Social History  . Marital Status: Divorced    Spouse Name: N/A  . Number of Children: N/A  . Years of Education: N/A   Social History Main Topics  . Smoking status: Current  Every Day Smoker -- 0.50 packs/day for 20 years    Types: Cigarettes  . Smokeless tobacco: Never Used     Comment: trying to cut back  . Alcohol Use: No  . Drug Use: No  . Sexual Activity: Not Asked   Other Topics Concern  . None   Social History Narrative   Past Surgical History  Procedure Laterality Date  . Abdominal hysterectomy    . Total knee arthroplasty    . Cholecystectomy    . Joint replacement      right knee  . Arthroscopies      knee-right x 7  . Colonoscopy with propofol N/A 02/26/2015    Procedure: COLONOSCOPY WITH PROPOFOL;  Surgeon: Inda Castle, MD;  Location: [facility];  Service: Endoscopy;  Laterality: N/A;  . Colonoscopy     Past Medical History  Diagnosis Date  . Osteoporosis   . Depression   . Hypertension   . Anemia   . Obesity   . Chronic kidney disease   . Hypothyroid     mild- no medication  . Transfusion history     70's, 80's ? hepatitis C attributed to past transfusions  . Hep C w/o coma, chronic ([place])     07-20-14 being presently tx. "Harvoni"-good response per pt. will complete in 7days- Dr. Linus Salmons  follows [facility].  . Hep B w/o coma   . Decreased mobility     hx. Rt. hip "avascular necrosis" -unable to weight bear long periods, "using wheelchair"..  . Superficial thrombophlebitis     left leg  . Complication of anesthesia   . PONV (postoperative nausea and vomiting)   . Knee stiffness     right knee -hard to bend at knee   BP 115/60 mmHg  Pulse 88  Resp 14  SpO2 97%  Opioid Risk Score:   Fall Risk Score:  `1  Depression screen PHQ 2/9  Depression screen [facility] 2/9 10/09/2015 09/11/2015 08/26/2015 05/14/2015 04/17/2015 12/17/2014 11/20/2014  Decreased Interest 2 2 0 3 2 2 1   Down, Depressed, Hopeless 2 2 0 3 2 2 1   PHQ - 2 Score 4 4 0 6 4 4 2   Altered sleeping - - - - - 1 1  Tired, decreased energy - - - - - 2 1  Change in appetite - - - - - 2 0  Feeling bad or failure about yourself  - - - - - 1 1   Trouble concentrating - - - - - 1 0  Moving slowly or fidgety/restless - - - - - 0 0  Suicidal thoughts - - - - - 0 0  PHQ-9 Score - - - - - 11 5     Review of Systems  All other systems reviewed and are negative.      Objective:   Physical Exam  Constitutional: She is oriented to person, place, and time. She appears well-developed and well-nourished.  HENT:  Head: Normocephalic and atraumatic.  Neck: Normal range of motion. Neck supple.  Cardiovascular: Normal rate and regular rhythm.   Pulmonary/Chest: Effort normal and breath sounds normal.  Musculoskeletal:  Normal Muscle Bulk and Muscle Testing Reveals: Upper Extremities: Full ROM and Muscle Strength 5/5 Thoracic Paraspinal Tenderness: T-7- T-9 Lumbar Hypersensitivity Lower Extremities: Right: Decreased ROM and Muscle Strength 4/5 Right Lower Extremity Flexion Produces Pain into Right hip and lower extremity Left: Full ROM and Muscle Strength 5/5 Arrived in motorized wheelchair   Neurological: She is alert and oriented to person, place, and time.  Skin: Skin is warm and dry.  Psychiatric: She has a normal mood and affect.  Nursing note and vitals reviewed.         Assessment & Plan:  1. Chronic right hip pain due to AVN with joint collapse: Continue oxycodone, Voltaren gel and Robaxin prn.  Refilled: Oxycodone 5 mg -use one tablet every 6 hours as needed for moderate pain #110. No more than 4 a day. We will continue the opioid monitoring program, this consists of regular clinic visits, examinations, urine drug screen, pill counts as well as use of [place] Controlled Substance reporting System. 2. Low back pain: Due to RLE leg length discrepancy.  Continue with current medication regime and heat therapy. 3. Balance deficits with poor endurance: Continue using her motorized wheelchair to increse her activity.  4. Right Hip Pain: Continue with heat and exercise and voltaren gel regimen.  20 minutes of  face to face patient care time was spent during this visit. All questions were encouraged and answered.   F/U in 1 month

## 2016-01-09 ENCOUNTER — Telehealth: Payer: Self-pay | Admitting: Family Medicine

## 2016-01-09 LAB — TOXASSURE SELECT,+ANTIDEPR,UR

## 2016-01-09 NOTE — Telephone Encounter (Signed)
Patient called to request her results for blood work, please f/u

## 2016-01-10 NOTE — Telephone Encounter (Signed)
Pt. Called requesting her Lab results. Please f/u with pt.

## 2016-01-14 NOTE — Progress Notes (Signed)
Urine drug screen for this encounter is consistent for prescribed medication 

## 2016-01-17 NOTE — Telephone Encounter (Signed)
PT will return for lab work today or tomorrow

## 2016-01-17 NOTE — Telephone Encounter (Signed)
LVM to return call   Please advised pt to return to clinic for lab work, last labs cancelled

## 2016-01-20 ENCOUNTER — Ambulatory Visit: Payer: Medicare Other | Attending: Family Medicine

## 2016-01-20 ENCOUNTER — Other Ambulatory Visit: Payer: Self-pay | Admitting: Family Medicine

## 2016-01-20 DIAGNOSIS — R5382 Chronic fatigue, unspecified: Secondary | ICD-10-CM

## 2016-01-20 DIAGNOSIS — E538 Deficiency of other specified B group vitamins: Secondary | ICD-10-CM | POA: Diagnosis not present

## 2016-01-20 LAB — CBC
HEMATOCRIT: 37.9 % (ref 35.0–45.0)
Hemoglobin: 12.6 g/dL (ref 11.7–15.5)
MCH: 29.8 pg (ref 27.0–33.0)
MCHC: 33.2 g/dL (ref 32.0–36.0)
MCV: 89.6 fL (ref 80.0–100.0)
MPV: 11.7 fL (ref 7.5–12.5)
PLATELETS: 141 10*3/uL (ref 140–400)
RBC: 4.23 MIL/uL (ref 3.80–5.10)
RDW: 14.5 % (ref 11.0–15.0)
WBC: 8.4 10*3/uL (ref 3.8–10.8)

## 2016-01-20 LAB — VITAMIN B12: Vitamin B-12: 670 pg/mL (ref 200–1100)

## 2016-01-20 LAB — TSH: TSH: 3.96 m[IU]/L

## 2016-01-20 NOTE — Addendum Note (Signed)
Addended by: Boykin Nearing on: 01/20/2016 09:07 AM   Modules accepted: Orders

## 2016-01-21 ENCOUNTER — Other Ambulatory Visit: Payer: Self-pay | Admitting: Family Medicine

## 2016-01-21 DIAGNOSIS — I10 Essential (primary) hypertension: Secondary | ICD-10-CM

## 2016-01-21 DIAGNOSIS — E039 Hypothyroidism, unspecified: Secondary | ICD-10-CM

## 2016-01-21 MED ORDER — LISINOPRIL 40 MG PO TABS: 40.0000 mg | ORAL_TABLET | Freq: Every day | ORAL | Status: DC

## 2016-01-21 MED ORDER — LEVOTHYROXINE SODIUM 50 MCG PO TABS: 50.0000 ug | ORAL_TABLET | Freq: Every day | ORAL | Status: DC

## 2016-01-21 MED ORDER — AMLODIPINE BESYLATE 5 MG PO TABS: 5.0000 mg | ORAL_TABLET | Freq: Every day | ORAL | Status: DC

## 2016-01-29 ENCOUNTER — Encounter: Payer: Self-pay | Admitting: Registered Nurse

## 2016-01-29 ENCOUNTER — Encounter: Payer: Medicare Other | Attending: Physical Medicine and Rehabilitation | Admitting: Registered Nurse

## 2016-01-29 VITALS — BP 128/81 | HR 78

## 2016-01-29 DIAGNOSIS — M24551 Contracture, right hip: Secondary | ICD-10-CM

## 2016-01-29 DIAGNOSIS — M545 Low back pain, unspecified: Secondary | ICD-10-CM

## 2016-01-29 DIAGNOSIS — Z79899 Other long term (current) drug therapy: Secondary | ICD-10-CM

## 2016-01-29 DIAGNOSIS — G894 Chronic pain syndrome: Secondary | ICD-10-CM | POA: Diagnosis not present

## 2016-01-29 DIAGNOSIS — M62830 Muscle spasm of back: Secondary | ICD-10-CM

## 2016-01-29 DIAGNOSIS — M8700 Idiopathic aseptic necrosis of unspecified bone: Secondary | ICD-10-CM | POA: Diagnosis not present

## 2016-01-29 DIAGNOSIS — Z5181 Encounter for therapeutic drug level monitoring: Secondary | ICD-10-CM

## 2016-01-29 MED ORDER — OXYCODONE HCL 5 MG PO TABS: 5.0000 mg | ORAL_TABLET | Freq: Four times a day (QID) | ORAL | Status: DC | PRN

## 2016-01-29 MED ORDER — BACLOFEN 10 MG PO TABS: 5.0000 mg | ORAL_TABLET | Freq: Two times a day (BID) | ORAL | Status: DC | PRN

## 2016-01-29 NOTE — Progress Notes (Signed)
Subjective:    Patient ID: [name], female    DOB: 1955/09/23, 60 y.o.   MRN: [name]  HPI: [name] is a 60 year old female who returns for follow up for chronic pain and medication refill. She states her pain is located in her lower back, right hip, right lower extremity and left knee.  She rates her pain 6. Her current exercise regime is performing chair exercises daily, using the bands and walking with her walker in her home. Arrived in motorized wheelchair.   Pain Inventory Average Pain 7 Pain Right Now 6 My pain is sharp, burning, dull and aching  In the last 24 hours, has pain interfered with the following? General activity 7 Relation with others 7 Enjoyment of life 7 What TIME of day is your pain at its worst? morning Sleep (in general) Fair  Pain is worse with: walking, bending, inactivity and standing Pain improves with: rest and medication Relief from Meds: 5  Mobility walk with assistance use a walker how many minutes can you walk? 2 ability to climb steps?  no do you drive?  no use a wheelchair Do you have any goals in this area?  yes  Function Do you have any goals in this area?  no  Neuro/Psych bowel control problems depression  Prior Studies Any changes since last visit?  no  Physicians involved in your care Any changes since last visit?  no   Family History  Problem Relation Age of Onset  . Cancer Mother   . Diabetes Mother   . Heart disease Mother   . Hypertension Mother   . Cancer Father   . Hypertension Sister   . Hypertension Brother   . Diabetes Brother   . Colon cancer Neg Hx    Social History   Social History  . Marital Status: Divorced    Spouse Name: N/A  . Number of Children: N/A  . Years of Education: N/A   Social History Main Topics  . Smoking status: Current Every Day Smoker -- 0.50 packs/day for 20 years    Types: Cigarettes  . Smokeless tobacco: Never Used     Comment: trying to cut back  .  Alcohol Use: No  . Drug Use: No  . Sexual Activity: Not Asked   Other Topics Concern  . None   Social History Narrative   Past Surgical History  Procedure Laterality Date  . Abdominal hysterectomy    . Total knee arthroplasty    . Cholecystectomy    . Joint replacement      right knee  . Arthroscopies      knee-right x 7  . Colonoscopy with propofol N/A 02/26/2015    Procedure: COLONOSCOPY WITH PROPOFOL;  Surgeon: Inda Castle, MD;  Location: [facility];  Service: Endoscopy;  Laterality: N/A;  . Colonoscopy     Past Medical History  Diagnosis Date  . Osteoporosis   . Depression   . Hypertension   . Anemia   . Obesity   . Chronic kidney disease   . Hypothyroid     mild- no medication  . Transfusion history     70's, 80's ? hepatitis C attributed to past transfusions  . Hep C w/o coma, chronic ([place])     07-20-14 being presently tx. "Harvoni"-good response per pt. will complete in 7days- Dr. Linus Salmons follows [facility].  . Hep B w/o coma   . Decreased mobility  hx. Rt. hip "avascular necrosis" -unable to weight bear long periods, "using wheelchair"..  . Superficial thrombophlebitis     left leg  . Complication of anesthesia   . PONV (postoperative nausea and vomiting)   . Knee stiffness     right knee -hard to bend at knee   BP 128/81 mmHg  Pulse 78  SpO2 96%  Opioid Risk Score:   Fall Risk Score:  `1  Depression screen PHQ 2/9  Depression screen [facility] 2/9 10/09/2015 09/11/2015 08/26/2015 05/14/2015 04/17/2015 12/17/2014 11/20/2014  Decreased Interest 2 2 0 3 2 2 1   Down, Depressed, Hopeless 2 2 0 3 2 2 1   PHQ - 2 Score 4 4 0 6 4 4 2   Altered sleeping - - - - - 1 1  Tired, decreased energy - - - - - 2 1  Change in appetite - - - - - 2 0  Feeling bad or failure about yourself  - - - - - 1 1  Trouble concentrating - - - - - 1 0  Moving slowly or fidgety/restless - - - - - 0 0  Suicidal thoughts - - - - - 0 0  PHQ-9 Score - - - - - 11 5      Review of Systems  Gastrointestinal: Positive for diarrhea.  Skin: Positive for rash.  All other systems reviewed and are negative.      Objective:   Physical Exam  Constitutional: She is oriented to person, place, and time. She appears well-developed and well-nourished.  HENT:  Head: Normocephalic and atraumatic.  Neck: Normal range of motion. Neck supple.  Cardiovascular: Normal rate and regular rhythm.   Pulmonary/Chest: Effort normal and breath sounds normal.  Musculoskeletal:  Normal Muscle Bulk and Muscle Testing Reveals: Upper Extremities: Full ROM and Muscle Strength 5/5 Lumbar Hypersensitivity Right Greater Trochanteric Tenderness Lower Extremities: Right: Decreased ROM and Muscle Strength 4/5 Left: Full ROM and Muscle Strength 5/5 Arrived in wheelchair   Neurological: She is alert and oriented to person, place, and time.  Skin: Skin is warm and dry.  Psychiatric: She has a normal mood and affect.  Nursing note and vitals reviewed.         Assessment & Plan:  1. Chronic right hip pain due to AVN with joint collapse: Continue oxycodone and  Voltaren gel Refilled: Oxycodone 5 mg -use one tablet every 6 hours as needed for moderate pain #110. No more than 4 a day. We will continue the opioid monitoring program, this consists of regular clinic visits, examinations, urine drug screen, pill counts as well as use of [place] Controlled Substance reporting System. 2. Low back pain: Due to RLE leg length discrepancy.  Continue with current medication regime and heat therapy. 3. Balance deficits with poor endurance: Continue using her motorized wheelchair to increse her activity.  4. Right Hip Pain: Continue with heat and exercise and voltaren gel regimen.  20 minutes of face to face patient care time was spent during this visit. All questions were encouraged and answered.   F/U in 1 month

## 2016-02-14 ENCOUNTER — Encounter: Payer: Self-pay | Admitting: Gastroenterology

## 2016-02-14 ENCOUNTER — Ambulatory Visit (INDEPENDENT_AMBULATORY_CARE_PROVIDER_SITE_OTHER): Payer: Medicare Other | Admitting: Gastroenterology

## 2016-02-14 VITALS — BP 114/64 | HR 80 | Ht 66.0 in | Wt 316.0 lb

## 2016-02-14 DIAGNOSIS — R197 Diarrhea, unspecified: Secondary | ICD-10-CM

## 2016-02-14 MED ORDER — ELUXADOLINE 75 MG PO TABS: 1.0000 | ORAL_TABLET | Freq: Two times a day (BID) | ORAL | Status: DC

## 2016-02-14 MED ORDER — NA SULFATE-K SULFATE-MG SULF 17.5-3.13-1.6 GM/177ML PO SOLN: 1.0000 | Freq: Once | ORAL | Status: DC

## 2016-02-14 NOTE — Patient Instructions (Signed)
You have been scheduled for a colonoscopy/endoscopy at [facility], separate instructions have been givenWe have sent Viberizi to your pharmacy  Avoid Lactose and sweet tea   Lactose-Free Diet, Adult If you have lactose intolerance, you are not able to digest lactose. Lactose is a natural sugar found mainly in milk and milk products. You may need to avoid all foods and beverages that contain lactose. A lactose-free diet can help you do this.  WHAT DO I NEED TO KNOW ABOUT THIS DIET?  Do not consume foods, beverages, vitamins, minerals, or medicines with lactose. Read ingredients lists carefully.  Look for the words "lactose-free" on labels.  Use lactase enzyme drops or tablets as directed by your health care provider.  Use lactose-free milk or a milk alternative, such as soy milk, for drinking and cooking.  Make sure you get enough calcium and vitamin D in your diet. A lactose-free eating plan can be lacking in these important nutrients.  Take calcium and vitamin D supplements as directed by your health care provider. Talk to your provider about supplements if you are not able to get enough calcium and vitamin D from food. WHICH FOODS HAVE LACTOSE? Lactose is found in:   Milk and foods made from milk.  Yogurt.   Cheese.  Butter.   Margarine.   Sour cream.   Cream.   Whipped toppings and nondairy creamers.  Ice cream and other milk-based desserts. Lactose is also found in foods or products made with milk or milk ingredients. To find out whether a food contains milk or a milk ingredient, look at the ingredients list. Avoid foods with the statement "May contain milk" and foods that contain:   Butter.   Cream.  Milk.  Milk solids.  Milk powder.   Whey.  Curd.  Caseinate.  Lactose.  Lactalbumin.  Lactoglobulin. WHAT ARE SOME ALTERNATIVES TO MILK AND FOODS MADE WITH MILK PRODUCTS?  Lactose-free milk.  Soy milk with added calcium and  vitamin D.  Almond, coconut, or rice milk with added calcium and vitamin D. Note that these are low in protein.   Soy products, such as soy yogurt, soy cheese, soy ice cream, and soy-based sour cream. WHICH FOODS CAN I EAT? Grains Breads and rolls made without milk, such as [place], [place], or [place] bread, bagels, pita, and [employer]. Corn tortillas, corn meal, grits, and polenta. Crackers without lactose or milk solids, such as soda crackers and graham crackers. Cooked or dry cereals without lactose or milk solids. Pasta, quinoa, couscous, barley, oats, bulgur, farro, rice, wild rice, or other grains prepared without milk or lactose. Plain popcorn.  Vegetables Fresh, frozen, and canned vegetables without cheese, cream, or butter sauces. Fruits All fresh, canned, frozen, or dried fruits that are not processed with lactose. Meats and Other Protein Sources Plain beef, chicken, fish, [place], lamb, veal, pork, wild game, or ham. Kosher-prepared meat products. Strained or junior meats that do not contain milk. Eggs. Soy meat substitutes. Beans, lentils, and hummus. Tofu. Nuts and seeds. Peanut or other nut butters without lactose. Soups, casseroles, and mixed dishes without cheese, cream, or milk.  Dairy Lactose-free milk. Soy, rice, or almond milk with added calcium and vitamin D. Soy cheese and yogurt. Beverages Carbonated drinks. Tea. Coffee, freeze-dried coffee, and some instant coffees. Fruit and vegetable juices.  Condiments Soy sauce. Carob powder. Olives. Gravy made with water. Baker's cocoa. Angie Fava. Pure seasonings and spices. Ketchup. Mustard. Bouillon. Broth.  Sweets and Desserts Water and fruit ices. Gelatin. Cookies,  pies, or cakes made from allowed ingredients, such as angel food cake. Pudding made with water or a milk substitute. Lactose-free tofu desserts. Soy, coconut milk, or rice-milk-based frozen desserts. Sugar. Honey. Jam, jelly, and marmalade. Molasses. Pure sugar candy. Dark  chocolate without milk. Marshmallows.  Fats and Oils Margarines and salad dressings that do not contain milk. Berniece Salines. Vegetable oils. Shortening. Mayonnaise. Soy or coconut-based cream.  The items listed above may not be a complete list of recommended foods or beverages. Contact your dietitian for more options.  WHICH FOODS ARE NOT RECOMMENDED? Grains Breads and rolls that contain milk. Toaster pastries. Muffins, biscuits, waffles, cornbread, and pancakes. These can be prepared at home, commercial, or from mixes. Sweet rolls, donuts, English muffins, fry bread, lefse, flour tortillas with lactose, or [place] toast made with milk or milk ingredients. Crackers that contain lactose. Corn curls. Cooked or dry cereals with lactose. Vegetables Creamed or breaded vegetables. Vegetables in a cheese or butter sauce or with lactose-containing margarines. Instant potatoes. [place] fries. Scalloped or au gratin potatoes.  Fruits None.  Meats and Other Protein Sources Scrambled eggs, omelets, and souffles that contain milk. Creamed or breaded meat, fish, chicken, or [place]. Sausage products, such as wieners and liver sausage. Cold cuts that contain milk solids. Cheese, cottage cheese, ricotta cheese, and cheese spreads. Lasagna and macaroni and cheese. Pizza. Peanut or other nut butters with added milk solids. Casseroles or mixed dishes containing milk or cheese.  Dairy All dairy products, including milk, goat's milk, buttermilk, kefir, acidophilus milk, flavored milk, evaporated milk, condensed milk, dulce de [place], eggnog, yogurt, cheese, and cheese spreads.  Beverages Hot chocolate. Cocoa with lactose. Instant iced teas. Powdered fruit drinks. Smoothies made with milk or yogurt.  Condiments Chewing gum that has lactose. Cocoa that has lactose. Spice blends if they contain milk products. Artificial sweeteners that contain lactose. Nondairy creamers.  Sweets and Desserts Ice cream, ice milk, gelato, sherbet,  and frozen yogurt. Custard, pudding, and mousse. Cake, cream pies, cookies, and other desserts containing milk, cream, cream cheese, or milk chocolate. Pie crust made with milk-containing margarine or butter. Reduced-calorie desserts made with a sugar substitute that contains lactose. Toffee and butterscotch. Milk, white, or dark chocolate that contains milk. Fudge. Caramel.  Fats and Oils Margarines and salad dressings that contain milk or cheese. Cream. Half and half. Cream cheese. Sour cream. Chip dips made with sour cream or yogurt.  The items listed above may not be a complete list of foods and beverages to avoid. Contact your dietitian for more information. AM I GETTING ENOUGH CALCIUM? Calcium is found in many foods that contain lactose and is important for bone health. The amount of calcium you need depends on your age:   Adults younger than 50 years: 1000 mg of calcium a day.  Adults older than 50 years: 1200 mg of calcium a day. If you are not getting enough calcium, other calcium sources include:   Orange juice with calcium added. There are 300-350 mg of calcium in 1 cup of orange juice.   Sardines with edible bones. There are 325 mg of calcium in 3 oz of sardines.   Calcium-fortified soy milk. There are 300-400 mg of calcium in 1 cup of calcium-fortified soy milk.  Calcium-fortified rice or almond milk. There are 300 mg of calcium in 1 cup of calcium-fortified rice or almond milk.  Canned salmon with edible bones. There are 180 mg of calcium in 3 oz of canned salmon with edible bones.  Calcium-fortified breakfast cereals. There are [phone] mg of calcium in calcium-fortified breakfast cereals.   Tofu set with calcium sulfate. There are 250 mg of calcium in  cup of tofu set with calcium sulfate.  Spinach, cooked. There are 145 mg of calcium in  cup of cooked spinach.  Edamame, cooked. There are 130 mg of calcium in  cup of cooked edamame.  Collard greens, cooked.  There are 125 mg of calcium in  cup of cooked collard greens.  Kale, frozen or cooked. There are 90 mg of calcium in  cup of cooked or frozen kale.   Almonds. There are 95 mg of calcium in  cup of almonds.  Broccoli, cooked. There are 60 mg of calcium in 1 cup of cooked broccoli.   This information is not intended to replace advice given to you by your health care provider. Make sure you discuss any questions you have with your health care provider.   Document Released: 02/13/2002 Document Revised: 01/08/2015 Document Reviewed: 11/24/2013 Elsevier Interactive Patient Education [employer].

## 2016-02-17 NOTE — Progress Notes (Signed)
[name]    [id number]    Jul 07, 1956  Primary Care Physician:FUNCHES, Lennox Laity, MD  Referring Physician: No referring provider defined for this encounter.  Chief complaint:  Diarrhea  HPI: 60 year old white female here with complaints of chronic diarrhea. Her symptoms are worse in the past 1 year. She continues to have multiple bowel movements during the day and very rarely has any nocturnal symptoms. She continues to drink sweet tea up to a gallon a day and also has milk with her cereal though she did cut back on the milk intake. Workup so far negative for pancreatic insufficiency, stool culture and C. difficile negative and celiac panel negative. Her last colonoscopy in June 2016 with removal of multiple sessile polyps. She continues to have intermittent heartburn and is taking Zantac daily. Denies any nausea, vomiting, abdominal pain, melena or bright red blood per rectum    Outpatient Encounter Prescriptions as of 02/14/2016  Medication Sig  . amLODipine (NORVASC) 5 MG tablet Take 1 tablet (5 mg total) by mouth daily.  . baclofen (LIORESAL) 10 MG tablet Take 0.5 tablets (5 mg total) by mouth 2 (two) times daily as needed for muscle spasms.  . diclofenac sodium (VOLTAREN) 1 % GEL Apply 2 g topically 4 (four) times daily as needed (pain).  Marland Kitchen diphenoxylate-atropine (LOMOTIL) 2.5-0.025 MG tablet Take 1 tablet by mouth 4 (four) times daily as needed for diarrhea or loose stools.  Marland Kitchen levothyroxine (SYNTHROID, LEVOTHROID) 50 MCG tablet Take 1 tablet (50 mcg total) by mouth daily.  Marland Kitchen lisinopril (PRINIVIL,ZESTRIL) 40 MG tablet Take 1 tablet (40 mg total) by mouth daily.  Marland Kitchen oxyCODONE (OXY IR/ROXICODONE) 5 MG immediate release tablet Take 1 tablet (5 mg total) by mouth every 6 (six) hours as needed for severe pain. One month supply  . ranitidine (ZANTAC) 150 MG capsule Take 150 mg by mouth daily. Reported on 08/26/2015  . sertraline (ZOLOFT) 50 MG tablet Take 1 tablet (50 mg  total) by mouth daily.  . Eluxadoline (VIBERZI) 75 MG TABS Take 1 tablet by mouth 2 (two) times daily.  . Na Sulfate-K Sulfate-Mg Sulf (SUPREP BOWEL PREP KIT) 17.5-3.13-1.6 GM/180ML SOLN Take 1 kit by mouth once.   No facility-administered encounter medications on file as of 02/14/2016.    Allergies as of 02/14/2016 - Review Complete 02/14/2016  Allergen Reaction Noted  . Penicillins Anaphylaxis 04/29/2009  . Demerol Nausea And Vomiting 10/17/2011  . Meperidine Nausea And Vomiting 09/11/2015    Past Medical History  Diagnosis Date  . Osteoporosis   . Depression   . Hypertension   . Anemia   . Obesity   . Chronic kidney disease   . Hypothyroid     mild- no medication  . Transfusion history     70's, 80's ? hepatitis C attributed to past transfusions  . Hep C w/o coma, chronic ([place])     07-20-14 being presently tx. "Harvoni"-good response per pt. will complete in 7days- Dr. Linus Salmons follows [place] [facility] disease [facility].  . Hep B w/o coma   . Decreased mobility     hx. Rt. hip "avascular necrosis" -unable to weight bear long periods, "using wheelchair"..  . Superficial thrombophlebitis     left leg  . Complication of anesthesia   . PONV (postoperative nausea and vomiting)   . Knee stiffness     right knee -hard to bend at knee    Past Surgical History  Procedure Laterality  Date  . Abdominal hysterectomy    . Total knee arthroplasty    . Cholecystectomy    . Joint replacement      right knee  . Arthroscopies      knee-right x 7  . Colonoscopy with propofol N/A 02/26/2015    Procedure: COLONOSCOPY WITH PROPOFOL;  Surgeon: Inda Castle, MD;  Location: [facility];  Service: Endoscopy;  Laterality: N/A;  . Colonoscopy      Family History  Problem Relation Age of Onset  . Cancer Mother   . Diabetes Mother   . Heart disease Mother   . Hypertension Mother   . Cancer Father   . Hypertension Sister   . Hypertension Brother   . Diabetes Brother   . Colon  cancer Neg Hx     Social History   Social History  . Marital Status: Divorced    Spouse Name: N/A  . Number of Children: N/A  . Years of Education: N/A   Occupational History  . Not on file.   Social History Main Topics  . Smoking status: Current Every Day Smoker -- 0.50 packs/day for 20 years    Types: Cigarettes  . Smokeless tobacco: Never Used     Comment: trying to cut back  . Alcohol Use: No  . Drug Use: No  . Sexual Activity: Not on file   Other Topics Concern  . Not on file   Social History Narrative      Review of systems: Review of Systems  Constitutional: Negative for fever and chills.  HENT: Negative.   Eyes: Negative for blurred vision.  Respiratory: Negative for cough, shortness of breath and wheezing.   Cardiovascular: Negative for chest pain and palpitations.  Gastrointestinal: as per HPI Genitourinary: Negative for dysuria, urgency, frequency and hematuria.  Musculoskeletal: Negative for myalgias, back pain and joint pain.  Skin: Negative for itching and rash.  Neurological: Negative for dizziness, tremors, focal weakness, seizures and loss of consciousness.  Endo/Heme/Allergies: Negative for environmental allergies.  Psychiatric/Behavioral: Negative for depression, suicidal ideas and hallucinations.  All other systems reviewed and are negative.   Physical Exam: Filed Vitals:   02/14/16 1332  BP: 114/64  Pulse: 80   Gen:      No acute distress, wheelchair, obese HEENT:  EOMI, sclera anicteric Neck:     No masses; no thyromegaly Lungs:    Clear to auscultation bilaterally; normal respiratory effort CV:         Regular rate and rhythm; no murmurs Abd:      + bowel sounds; soft, non-tender; no palpable masses, no distension, ventral hernia Ext:    No edema; adequate peripheral perfusion Skin:      Warm and dry; no rash Neuro: alert and oriented x 3 Psych: normal mood and affect  Data Reviewed:  Reviewed chart in Epic   Assessment and  Plan/Recommendations:  60 year old female with history of morbid obesity, avascular necrosis of right hip, wheelchair bound here with complaints of chronic diarrhea We'll schedule for colonoscopy to rule out microscopic colitis given her persistent symptoms Also schedule EGD at the same time to obtain small bowel biopsies to exclude sprue Advised patient to avoid lactose and excessive amount of sweetened Tea We'll do a trial of Viberzi 75 mg twice daily for 2-4 weeks    K. Denzil Magnuson , MD [phone] Mon-Fri 8a-5p [phone] after 5p, weekends, holidays  CC: No ref. provider found

## 2016-03-02 ENCOUNTER — Encounter: Payer: Medicare Other | Attending: Physical Medicine and Rehabilitation | Admitting: Registered Nurse

## 2016-03-02 ENCOUNTER — Ambulatory Visit: Payer: Medicare Other | Admitting: Physical Medicine & Rehabilitation

## 2016-03-02 ENCOUNTER — Encounter (HOSPITAL_COMMUNITY): Payer: Self-pay | Admitting: *Deleted

## 2016-03-02 ENCOUNTER — Encounter: Payer: Self-pay | Admitting: Registered Nurse

## 2016-03-02 VITALS — BP 108/72 | HR 92 | Resp 14

## 2016-03-02 DIAGNOSIS — M545 Low back pain, unspecified: Secondary | ICD-10-CM

## 2016-03-02 DIAGNOSIS — M8700 Idiopathic aseptic necrosis of unspecified bone: Secondary | ICD-10-CM | POA: Diagnosis not present

## 2016-03-02 DIAGNOSIS — Z5181 Encounter for therapeutic drug level monitoring: Secondary | ICD-10-CM

## 2016-03-02 DIAGNOSIS — M24551 Contracture, right hip: Secondary | ICD-10-CM | POA: Diagnosis not present

## 2016-03-02 DIAGNOSIS — Z79899 Other long term (current) drug therapy: Secondary | ICD-10-CM

## 2016-03-02 DIAGNOSIS — G894 Chronic pain syndrome: Secondary | ICD-10-CM | POA: Diagnosis not present

## 2016-03-02 DIAGNOSIS — M25551 Pain in right hip: Secondary | ICD-10-CM

## 2016-03-02 DIAGNOSIS — M62830 Muscle spasm of back: Secondary | ICD-10-CM

## 2016-03-02 MED ORDER — OXYCODONE HCL 5 MG PO TABS: 5.0000 mg | ORAL_TABLET | Freq: Four times a day (QID) | ORAL | Status: DC | PRN

## 2016-03-02 NOTE — Progress Notes (Signed)
Pt denies cardiac history, chest pain or sob. Pt is wheelchair bound. Last EKG was 2010.

## 2016-03-03 ENCOUNTER — Ambulatory Visit (HOSPITAL_COMMUNITY): Payer: Medicare Other | Admitting: Anesthesiology

## 2016-03-03 ENCOUNTER — Ambulatory Visit: Payer: Medicare Other

## 2016-03-03 ENCOUNTER — Ambulatory Visit: Payer: Medicare Other | Admitting: Physical Medicine & Rehabilitation

## 2016-03-03 ENCOUNTER — Encounter (HOSPITAL_COMMUNITY): Payer: Self-pay

## 2016-03-03 ENCOUNTER — Encounter: Payer: Self-pay | Admitting: Registered Nurse

## 2016-03-03 ENCOUNTER — Encounter (HOSPITAL_COMMUNITY)
Admission: RE | Disposition: A | Discharge status: Home / Self Care | Payer: Self-pay | Source: Ambulatory Visit | Attending: Gastroenterology

## 2016-03-03 ENCOUNTER — Ambulatory Visit (HOSPITAL_COMMUNITY)
Admission: RE | Admit: 2016-03-03 | Discharge: 2016-03-03 | Disposition: A | Discharge status: Home / Self Care | Payer: Medicare Other | Source: Ambulatory Visit | Attending: Gastroenterology | Admitting: Gastroenterology

## 2016-03-03 DIAGNOSIS — N189 Chronic kidney disease, unspecified: Secondary | ICD-10-CM | POA: Diagnosis not present

## 2016-03-03 DIAGNOSIS — Z86010 Personal history of colonic polyps: Secondary | ICD-10-CM | POA: Insufficient documentation

## 2016-03-03 DIAGNOSIS — I129 Hypertensive chronic kidney disease with stage 1 through stage 4 chronic kidney disease, or unspecified chronic kidney disease: Secondary | ICD-10-CM | POA: Diagnosis not present

## 2016-03-03 DIAGNOSIS — D123 Benign neoplasm of transverse colon: Secondary | ICD-10-CM | POA: Diagnosis not present

## 2016-03-03 DIAGNOSIS — Z8672 Personal history of thrombophlebitis: Secondary | ICD-10-CM | POA: Diagnosis not present

## 2016-03-03 DIAGNOSIS — D124 Benign neoplasm of descending colon: Secondary | ICD-10-CM | POA: Diagnosis not present

## 2016-03-03 DIAGNOSIS — Z6841 Body Mass Index (BMI) 40.0 and over, adult: Secondary | ICD-10-CM | POA: Diagnosis not present

## 2016-03-03 DIAGNOSIS — D122 Benign neoplasm of ascending colon: Secondary | ICD-10-CM | POA: Diagnosis not present

## 2016-03-03 DIAGNOSIS — E039 Hypothyroidism, unspecified: Secondary | ICD-10-CM | POA: Insufficient documentation

## 2016-03-03 DIAGNOSIS — Z993 Dependence on wheelchair: Secondary | ICD-10-CM | POA: Insufficient documentation

## 2016-03-03 DIAGNOSIS — Z7409 Other reduced mobility: Secondary | ICD-10-CM | POA: Diagnosis not present

## 2016-03-03 DIAGNOSIS — K635 Polyp of colon: Secondary | ICD-10-CM | POA: Diagnosis not present

## 2016-03-03 DIAGNOSIS — Z79899 Other long term (current) drug therapy: Secondary | ICD-10-CM | POA: Diagnosis not present

## 2016-03-03 DIAGNOSIS — F329 Major depressive disorder, single episode, unspecified: Secondary | ICD-10-CM | POA: Insufficient documentation

## 2016-03-03 DIAGNOSIS — R197 Diarrhea, unspecified: Secondary | ICD-10-CM | POA: Diagnosis not present

## 2016-03-03 DIAGNOSIS — I10 Essential (primary) hypertension: Secondary | ICD-10-CM | POA: Diagnosis not present

## 2016-03-03 DIAGNOSIS — Z96651 Presence of right artificial knee joint: Secondary | ICD-10-CM | POA: Diagnosis not present

## 2016-03-03 DIAGNOSIS — K2100 Gastro-esophageal reflux disease with esophagitis, without bleeding: Secondary | ICD-10-CM | POA: Insufficient documentation

## 2016-03-03 DIAGNOSIS — K219 Gastro-esophageal reflux disease without esophagitis: Secondary | ICD-10-CM | POA: Diagnosis not present

## 2016-03-03 DIAGNOSIS — F17210 Nicotine dependence, cigarettes, uncomplicated: Secondary | ICD-10-CM | POA: Insufficient documentation

## 2016-03-03 HISTORY — DX: Unspecified osteoarthritis, unspecified site: M19.90

## 2016-03-03 HISTORY — DX: Pneumonia, unspecified organism: J18.9

## 2016-03-03 HISTORY — PX: ESOPHAGOGASTRODUODENOSCOPY (EGD) WITH PROPOFOL: SHX5813

## 2016-03-03 HISTORY — PX: COLONOSCOPY WITH PROPOFOL: SHX5780

## 2016-03-03 HISTORY — DX: Cramp and spasm: R25.2

## 2016-03-03 HISTORY — DX: Reserved for inherently not codable concepts without codable children: IMO0001

## 2016-03-03 HISTORY — DX: Gastro-esophageal reflux disease without esophagitis: K21.9

## 2016-03-03 SURGERY — COLONOSCOPY WITH PROPOFOL
Anesthesia: Monitor Anesthesia Care

## 2016-03-03 MED ORDER — OMEPRAZOLE 40 MG PO CPDR: 40.0000 mg | DELAYED_RELEASE_CAPSULE | Freq: Every day | ORAL | Status: DC

## 2016-03-03 MED ORDER — LACTATED RINGERS IV SOLN
INTRAVENOUS | Status: DC
  Administered 2016-03-03: 1000 mL via INTRAVENOUS
  Administered 2016-03-03: 11:00:00 via INTRAVENOUS

## 2016-03-03 MED ORDER — SODIUM CHLORIDE 0.9 % IV SOLN: INTRAVENOUS | Status: DC

## 2016-03-03 MED ORDER — SIMETHICONE 40 MG/0.6ML PO SUSP
40.0000 mg | Freq: Once | ORAL | Status: AC
  Administered 2016-03-03: 40 mg via ORAL

## 2016-03-03 MED ORDER — SIMETHICONE 40 MG/0.6ML PO SUSP
ORAL | Status: AC
  Filled 2016-03-03: qty 0.6

## 2016-03-03 MED ORDER — RANITIDINE HCL 300 MG PO CAPS: 300.0000 mg | ORAL_CAPSULE | Freq: Every evening | ORAL | Status: DC | PRN

## 2016-03-03 MED ORDER — ONDANSETRON HCL 4 MG/2ML IJ SOLN: INTRAMUSCULAR | Status: DC | PRN

## 2016-03-03 MED ORDER — PROPOFOL 10 MG/ML IV BOLUS
INTRAVENOUS | Status: DC | PRN
  Administered 2016-03-03: 30 mg via INTRAVENOUS

## 2016-03-03 MED ORDER — BUTAMBEN-TETRACAINE-BENZOCAINE 2-2-14 % EX AERO
INHALATION_SPRAY | CUTANEOUS | Status: DC | PRN
  Administered 2016-03-03: 2 via TOPICAL

## 2016-03-03 MED ORDER — PHENYLEPHRINE HCL 10 MG/ML IJ SOLN
INTRAMUSCULAR | Status: DC | PRN
  Administered 2016-03-03: 80 ug via INTRAVENOUS

## 2016-03-03 MED ORDER — PROPOFOL 500 MG/50ML IV EMUL
INTRAVENOUS | Status: DC | PRN
  Administered 2016-03-03: 150 ug/kg/min via INTRAVENOUS

## 2016-03-03 NOTE — Op Note (Signed)
[facility] Patient Name: [name] Procedure Date : 03/03/2016 MRN: [id number] Attending MD: Mauri Pole , MD Date of Birth: 04/15/1956 CSN: [id number] Age: 60 Admit Type: Outpatient Procedure:                Colonoscopy Indications:              Clinically significant diarrhea of unexplained                            origin Providers:                Mauri Pole, MD, Dustin Flock, RN, Corliss Parish, Technician, Tawni Carnes, CRNA Referring MD:              Medicines:                Monitored Anesthesia Care Complications:            No immediate complications. Estimated Blood Loss:     Estimated blood loss was minimal. Procedure:                Pre-Anesthesia Assessment:                           - Prior to the procedure, a History and Physical                            was performed, and patient medications and                            allergies were reviewed. The patient's tolerance of                            previous anesthesia was also reviewed. The risks                            and benefits of the procedure and the sedation                            options and risks were discussed with the patient.                            All questions were answered, and informed consent                            was obtained. Prior Anticoagulants: The patient has                            taken no previous anticoagulant or antiplatelet                            agents. ASA Grade Assessment: III - A patient with  severe systemic disease. After reviewing the risks                            and benefits, the patient was deemed in                            satisfactory condition to undergo the procedure.                           After obtaining informed consent, the colonoscope                            was passed under direct vision. Throughout the                            procedure,  the patient's blood pressure, pulse, and                            oxygen saturations were monitored continuously. The                            [id number]) scope was introduced through                            the anus and advanced to the the terminal ileum,                            with identification of the appendiceal orifice and                            IC valve. The colonoscopy was performed without                            difficulty. The patient tolerated the procedure                            well. The quality of the bowel preparation was                            good. The terminal ileum, ileocecal valve,                            appendiceal orifice, and rectum were photographed. Scope In: 10:22:22 AM Scope Out: 10:51:03 AM Scope Withdrawal Time: 0 hours 23 minutes 12 seconds  Total Procedure Duration: 0 hours 28 minutes 41 seconds  Findings:      The perianal and digital rectal examinations were normal.      Three sessile polyps were found in the ascending colon. The polyps were       3 to 4 mm in size. These polyps were removed with a cold biopsy forceps.       Resection and retrieval were complete.      Three sessile polyps were found in the descending colon and transverse       colon. The polyps were 5 to 12 mm  in size. These polyps were removed       with a cold snare. Resection and retrieval were complete.      The terminal ileum appeared normal.      Normal mucosa was found in the entire colon. Biopsies for histology were       taken with a cold forceps from the entire colon for evaluation of       microscopic colitis. Impression:               - Three 3 to 4 mm polyps in the ascending colon,                            removed with a cold biopsy forceps. Resected and                            retrieved.                           - Three 5 to 12 mm polyps in the descending colon                            and in the transverse colon, removed with  a cold                            snare. Resected and retrieved.                           - The examined portion of the ileum was normal.                           - Normal mucosa in the entire examined colon.                            Biopsied. Moderate Sedation:      N/A Recommendation:           - Patient has a contact number available for                            emergencies. The signs and symptoms of potential                            delayed complications were discussed with the                            patient. Return to normal activities tomorrow.                            Written discharge instructions were provided to the                            patient.                           - Resume previous diet.                           -  Continue present medications.                           - Await pathology results.                           - Repeat colonoscopy in 3 years for surveillance.                           - Return to GI clinic PRN. Procedure Code(s):        --- Professional ---                           [phone], Colonoscopy, flexible; with removal of                            tumor(s), polyp(s), or other lesion(s) by snare                            technique                           [id number], 66, Colonoscopy, flexible; with biopsy,                            single or multiple Diagnosis Code(s):        --- Professional ---                           D12.2, Benign neoplasm of ascending colon                           D12.4, Benign neoplasm of descending colon                           D12.3, Benign neoplasm of transverse colon (hepatic                            flexure or splenic flexure)                           R19.7, Diarrhea, unspecified CPT copyright 2016 American Medical Association. All rights reserved. The codes documented in this report are preliminary and upon coder review may  be revised to meet current compliance requirements. Mauri Pole, MD 03/03/2016 10:59:28 AM This report has been signed electronically. Number of Addenda: 0

## 2016-03-03 NOTE — H&P (View-Only) (Signed)
[name]    [id number]    Jul 07, 1956  Primary Care Physician:FUNCHES, Lennox Laity, MD  Referring Physician: No referring provider defined for this encounter.  Chief complaint:  Diarrhea  HPI: 60 year old white female here with complaints of chronic diarrhea. Her symptoms are worse in the past 1 year. She continues to have multiple bowel movements during the day and very rarely has any nocturnal symptoms. She continues to drink sweet tea up to a gallon a day and also has milk with her cereal though she did cut back on the milk intake. Workup so far negative for pancreatic insufficiency, stool culture and C. difficile negative and celiac panel negative. Her last colonoscopy in June 2016 with removal of multiple sessile polyps. She continues to have intermittent heartburn and is taking Zantac daily. Denies any nausea, vomiting, abdominal pain, melena or bright red blood per rectum    Outpatient Encounter Prescriptions as of 02/14/2016  Medication Sig  . amLODipine (NORVASC) 5 MG tablet Take 1 tablet (5 mg total) by mouth daily.  . baclofen (LIORESAL) 10 MG tablet Take 0.5 tablets (5 mg total) by mouth 2 (two) times daily as needed for muscle spasms.  . diclofenac sodium (VOLTAREN) 1 % GEL Apply 2 g topically 4 (four) times daily as needed (pain).  Marland Kitchen diphenoxylate-atropine (LOMOTIL) 2.5-0.025 MG tablet Take 1 tablet by mouth 4 (four) times daily as needed for diarrhea or loose stools.  Marland Kitchen levothyroxine (SYNTHROID, LEVOTHROID) 50 MCG tablet Take 1 tablet (50 mcg total) by mouth daily.  Marland Kitchen lisinopril (PRINIVIL,ZESTRIL) 40 MG tablet Take 1 tablet (40 mg total) by mouth daily.  Marland Kitchen oxyCODONE (OXY IR/ROXICODONE) 5 MG immediate release tablet Take 1 tablet (5 mg total) by mouth every 6 (six) hours as needed for severe pain. One month supply  . ranitidine (ZANTAC) 150 MG capsule Take 150 mg by mouth daily. Reported on 08/26/2015  . sertraline (ZOLOFT) 50 MG tablet Take 1 tablet (50 mg  total) by mouth daily.  . Eluxadoline (VIBERZI) 75 MG TABS Take 1 tablet by mouth 2 (two) times daily.  . Na Sulfate-K Sulfate-Mg Sulf (SUPREP BOWEL PREP KIT) 17.5-3.13-1.6 GM/180ML SOLN Take 1 kit by mouth once.   No facility-administered encounter medications on file as of 02/14/2016.    Allergies as of 02/14/2016 - Review Complete 02/14/2016  Allergen Reaction Noted  . Penicillins Anaphylaxis 04/29/2009  . Demerol Nausea And Vomiting 10/17/2011  . Meperidine Nausea And Vomiting 09/11/2015    Past Medical History  Diagnosis Date  . Osteoporosis   . Depression   . Hypertension   . Anemia   . Obesity   . Chronic kidney disease   . Hypothyroid     mild- no medication  . Transfusion history     70's, 80's ? hepatitis C attributed to past transfusions  . Hep C w/o coma, chronic ([place])     07-20-14 being presently tx. "Harvoni"-good response per pt. will complete in 7days- Dr. Linus Salmons follows [place] [facility] disease [facility].  . Hep B w/o coma   . Decreased mobility     hx. Rt. hip "avascular necrosis" -unable to weight bear long periods, "using wheelchair"..  . Superficial thrombophlebitis     left leg  . Complication of anesthesia   . PONV (postoperative nausea and vomiting)   . Knee stiffness     right knee -hard to bend at knee    Past Surgical History  Procedure Laterality  Date  . Abdominal hysterectomy    . Total knee arthroplasty    . Cholecystectomy    . Joint replacement      right knee  . Arthroscopies      knee-right x 7  . Colonoscopy with propofol N/A 02/26/2015    Procedure: COLONOSCOPY WITH PROPOFOL;  Surgeon: Inda Castle, MD;  Location: [facility];  Service: Endoscopy;  Laterality: N/A;  . Colonoscopy      Family History  Problem Relation Age of Onset  . Cancer Mother   . Diabetes Mother   . Heart disease Mother   . Hypertension Mother   . Cancer Father   . Hypertension Sister   . Hypertension Brother   . Diabetes Brother   . Colon  cancer Neg Hx     Social History   Social History  . Marital Status: Divorced    Spouse Name: N/A  . Number of Children: N/A  . Years of Education: N/A   Occupational History  . Not on file.   Social History Main Topics  . Smoking status: Current Every Day Smoker -- 0.50 packs/day for 20 years    Types: Cigarettes  . Smokeless tobacco: Never Used     Comment: trying to cut back  . Alcohol Use: No  . Drug Use: No  . Sexual Activity: Not on file   Other Topics Concern  . Not on file   Social History Narrative      Review of systems: Review of Systems  Constitutional: Negative for fever and chills.  HENT: Negative.   Eyes: Negative for blurred vision.  Respiratory: Negative for cough, shortness of breath and wheezing.   Cardiovascular: Negative for chest pain and palpitations.  Gastrointestinal: as per HPI Genitourinary: Negative for dysuria, urgency, frequency and hematuria.  Musculoskeletal: Negative for myalgias, back pain and joint pain.  Skin: Negative for itching and rash.  Neurological: Negative for dizziness, tremors, focal weakness, seizures and loss of consciousness.  Endo/Heme/Allergies: Negative for environmental allergies.  Psychiatric/Behavioral: Negative for depression, suicidal ideas and hallucinations.  All other systems reviewed and are negative.   Physical Exam: Filed Vitals:   02/14/16 1332  BP: 114/64  Pulse: 80   Gen:      No acute distress, wheelchair, obese HEENT:  EOMI, sclera anicteric Neck:     No masses; no thyromegaly Lungs:    Clear to auscultation bilaterally; normal respiratory effort CV:         Regular rate and rhythm; no murmurs Abd:      + bowel sounds; soft, non-tender; no palpable masses, no distension, ventral hernia Ext:    No edema; adequate peripheral perfusion Skin:      Warm and dry; no rash Neuro: alert and oriented x 3 Psych: normal mood and affect  Data Reviewed:  Reviewed chart in Epic   Assessment and  Plan/Recommendations:  60 year old female with history of morbid obesity, avascular necrosis of right hip, wheelchair bound here with complaints of chronic diarrhea We'll schedule for colonoscopy to rule out microscopic colitis given her persistent symptoms Also schedule EGD at the same time to obtain small bowel biopsies to exclude sprue Advised patient to avoid lactose and excessive amount of sweetened Tea We'll do a trial of Viberzi 75 mg twice daily for 2-4 weeks    K. Denzil Magnuson , MD [phone] Mon-Fri 8a-5p [phone] after 5p, weekends, holidays  CC: No ref. provider found

## 2016-03-03 NOTE — Op Note (Signed)
[facility] Patient Name: [name] Procedure Date : 03/03/2016 MRN: [id number] Attending MD: Mauri Pole , MD Date of Birth: [birth year] CSN: [id number] Age: 60 Admit Type: Outpatient Procedure:                Upper GI endoscopy Indications:              Endoscopy to assess diarrhea in patient suspected                            of having celiac disease, Diagnostic sampling is                            indicated, Esophageal reflux symptoms that recur                            despite appropriate therapy Providers:                Mauri Pole, MD, Dustin Flock, RN, Corliss Parish, Technician, Shelton Silvas, CRNA Referring MD:              Medicines:                Monitored Anesthesia Care Complications:            No immediate complications. Estimated Blood Loss:     Estimated blood loss was minimal. Procedure:                Pre-Anesthesia Assessment:                           - Prior to the procedure, a History and Physical                            was performed, and patient medications and                            allergies were reviewed. The patient's tolerance of                            previous anesthesia was also reviewed. The risks                            and benefits of the procedure and the sedation                            options and risks were discussed with the patient.                            All questions were answered, and informed consent                            was obtained. Prior Anticoagulants: The patient has  taken no previous anticoagulant or antiplatelet                            agents. ASA Grade Assessment: III - A patient with                            severe systemic disease. After reviewing the risks                            and benefits, the patient was deemed in                            satisfactory condition to undergo the procedure.                    After obtaining informed consent, the endoscope was                            passed under direct vision. Throughout the                            procedure, the patient's blood pressure, pulse, and                            oxygen saturations were monitored continuously. The                            [id number] scope was introduced through the                            mouth, and advanced to the second part of duodenum.                            The upper GI endoscopy was accomplished without                            difficulty. The patient tolerated the procedure                            well. Scope In: Scope Out: Findings:      LA Grade C (one or more mucosal breaks continuous between tops of 2 or       more mucosal folds, less than 75% circumference) esophagitis with no       bleeding was found 35 to 37 cm from the incisors. prominent fold vs       nodule noted close to EG junction. Biopsies were taken with a cold       forceps for histology.      The stomach was normal.      The first portion of the duodenum and second portion of the duodenum       were normal. Biopsies for histology were taken with a cold forceps for       evaluation of celiac disease. Impression:               - LA Grade C reflux esophagitis. Rule out Barrett's  esophagus. Biopsied.                           - Normal stomach.                           - Normal first portion of the duodenum and second                            portion of the duodenum. Biopsied. Moderate Sedation:      N/A Recommendation:           - Patient has a contact number available for                            emergencies. The signs and symptoms of potential                            delayed complications were discussed with the                            patient. Return to normal activities tomorrow.                            Written discharge instructions were provided to  the                            patient.                           - Resume previous diet.                           - Continue present medications except Stop Taking                            Viberzi given no change in symptoms                           - Await pathology results.                           - No aspirin, ibuprofen, naproxen, or other                            non-steroidal anti-inflammatory drugs after biopsy. Procedure Code(s):        --- Professional ---                           [id number], Esophagogastroduodenoscopy, flexible,                            transoral; with biopsy, single or multiple Diagnosis Code(s):        --- Professional ---                           K21.0, Gastro-esophageal reflux disease with  esophagitis                           R19.7, Diarrhea, unspecified CPT copyright 2016 [employer] Medical Association. All rights reserved. The codes documented in this report are preliminary and upon coder review may  be revised to meet current compliance requirements. Mauri Pole, MD 03/03/2016 11:35:38 AM This report has been signed electronically. Number of Addenda: 0

## 2016-03-03 NOTE — Interval H&P Note (Signed)
History and Physical Interval Note:  03/03/2016 9:51 AM  [name]  has presented today for surgery, with the diagnosis of diarrhea, and heartburn   The various methods of treatment have been discussed with the patient and family. After consideration of risks, benefits and other options for treatment, the patient has consented to  Procedure(s): COLONOSCOPY WITH PROPOFOL (N/A) ESOPHAGOGASTRODUODENOSCOPY (EGD) WITH PROPOFOL (N/A) as a surgical intervention .  The patient's history has been reviewed, patient examined, no change in status, stable for surgery.  I have reviewed the patient's chart and labs.  Questions were answered to the patient's satisfaction.    Damaris Hippo , MD [phone] Mon-Fri 8a-5p [phone] after 5p, weekends, holidays

## 2016-03-03 NOTE — Discharge Instructions (Signed)
Colonoscopy, Care After These instructions give you information on caring for yourself after your procedure. Your doctor may also give you more specific instructions. Call your doctor if you have any problems or questions after your procedure. HOME CARE  Do not drive for 24 hours.  Do not sign important papers or use machinery for 24 hours.  You may shower.  You may go back to your usual activities, but go slower for the first 24 hours.  Take rest breaks often during the first 24 hours.  Walk around or use warm packs on your belly (abdomen) if you have belly cramping or gas.  Drink enough fluids to keep your pee (urine) clear or pale yellow.  Resume your normal diet. Avoid heavy or fried foods.  Avoid drinking alcohol for 24 hours or as told by your doctor.  Only take medicines as told by your doctor. If a tissue sample (biopsy) was taken during the procedure:   Do not take aspirin or blood thinners for 7 days, or as told by your doctor.  Do not drink alcohol for 7 days, or as told by your doctor.  Eat soft foods for the first 24 hours. GET HELP IF: You still have a small amount of blood in your poop (stool) 2-3 days after the procedure. GET HELP RIGHT AWAY IF:  You have more than a small amount of blood in your poop.  You see clumps of tissue (blood clots) in your poop.  Your belly is puffy (swollen).  You feel sick to your stomach (nauseous) or throw up (vomit).  You have a fever.  You have belly pain that gets worse and medicine does not help. MAKE SURE YOU:  Understand these instructions.  Will watch your condition.  Will get help right away if you are not doing well or get worse.   This information is not intended to replace advice given to you by your health care provider. Make sure you discuss any questions you have with your health care provider.   Document Released: 09/26/2010 Document Revised: 08/29/2013 Document Reviewed: 05/01/2013 Elsevier  Interactive Patient Education 2016 [place]. Esophagogastroduodenoscopy, Care After Refer to this sheet in the next few weeks. These instructions provide you with information about caring for yourself after your procedure. Your health care provider may also give you more specific instructions. Your treatment has been planned according to current medical practices, but problems sometimes occur. Call your health care provider if you have any problems or questions after your procedure. WHAT TO EXPECT AFTER THE PROCEDURE After your procedure, it is typical to feel:  Soreness in your throat.  Pain with swallowing.  Sick to your stomach (nauseous).  Bloated.  Dizzy.  Fatigued. HOME CARE INSTRUCTIONS  Do not eat or drink anything until the numbing medicine (local anesthetic) has worn off and your gag reflex has returned. You will know that the local anesthetic has worn off when you can swallow comfortably.  Do not drive or operate machinery until directed by your health care provider.  Take medicines only as directed by your health care provider. SEEK MEDICAL CARE IF:   You cannot stop coughing.  You are not urinating at all or less than usual. SEEK IMMEDIATE MEDICAL CARE IF:  You have difficulty swallowing.  You cannot eat or drink.  You have worsening throat or chest pain.  You have dizziness or lightheadedness or you faint.  You have nausea or vomiting.  You have chills.  You have a fever.  You have severe abdominal pain.  You have black, tarry, or bloody stools.   This information is not intended to replace advice given to you by your health care provider. Make sure you discuss any questions you have with your health care provider.   Document Released: 08/10/2012 Document Revised: 09/14/2014 Document Reviewed: 08/10/2012 Elsevier Interactive Patient Education [employer].

## 2016-03-03 NOTE — Anesthesia Preprocedure Evaluation (Addendum)
Anesthesia Evaluation  Patient identified by MRN, date of birth, ID band Patient awake    Reviewed: Allergy & Precautions, NPO status , Patient's Chart, lab work & pertinent test results  History of Anesthesia Complications (+) PONV  Airway Mallampati: II  TM Distance: >3 FB Neck ROM: Full    Dental no notable dental hx. (+) Edentulous Upper, Edentulous Lower   Pulmonary Current Smoker,    Pulmonary exam normal breath sounds clear to auscultation       Cardiovascular hypertension, Pt. on medications Normal cardiovascular exam Rhythm:Regular Rate:Normal     Neuro/Psych negative neurological ROS  negative psych ROS   GI/Hepatic negative GI ROS, (+) Hepatitis -, C, B  Endo/Other  Morbid obesity  Renal/GU negative Renal ROS  negative genitourinary   Musculoskeletal negative musculoskeletal ROS (+)   Abdominal   Peds negative pediatric ROS (+)  Hematology negative hematology ROS (+)   Anesthesia Other Findings   Reproductive/Obstetrics negative OB ROS                            Anesthesia Physical Anesthesia Plan  ASA: III  Anesthesia Plan: MAC   Post-op Pain Management:    Induction:   Airway Management Planned: Natural Airway  Additional Equipment:   Intra-op Plan:   Post-operative Plan:   Informed Consent: I have reviewed the patients History and Physical, chart, labs and discussed the procedure including the risks, benefits and alternatives for the proposed anesthesia with the patient or authorized representative who has indicated his/her understanding and acceptance.   Dental advisory given  Plan Discussed with: CRNA  Anesthesia Plan Comments:         Anesthesia Quick Evaluation

## 2016-03-03 NOTE — Transfer of Care (Signed)
Immediate Anesthesia Transfer of Care Note  Patient: [name]  Procedure(s) Performed: Procedure(s): COLONOSCOPY WITH PROPOFOL (N/A) ESOPHAGOGASTRODUODENOSCOPY (EGD) WITH PROPOFOL (N/A)  Patient Location: Endoscopy Unit  Anesthesia Type:MAC  Level of Consciousness: awake and alert   Airway & Oxygen Therapy: Patient Spontanous Breathing  Post-op Assessment: Report given to RN and Post -op Vital signs reviewed and stable  Post vital signs: Reviewed and stable  Last Vitals:  Filed Vitals:   03/03/16 0913  BP: 116/55  Pulse: 72  Temp: 37.2 C  Resp: 15    Last Pain: There were no vitals filed for this visit.       Complications: No apparent anesthesia complications

## 2016-03-03 NOTE — Progress Notes (Signed)
Subjective:    Patient ID: [name], female    DOB: 1955/09/25, 60 y.o.   MRN: [id number]  HPI: [name] is a 60 year old female who returns for follow up for chronic pain and medication refill. She states her pain is located in her lower back, right hip, right lower extremity and left knee. She rates her pain 8. Her current exercise regime is performing chair exercises daily, using the bands and walking with her walker in her home. Arrived in motorized wheelchair.  Pain Inventory Average Pain 7 Pain Right Now 8 My pain is sharp, stabbing and aching  In the last 24 hours, has pain interfered with the following? General activity 7 Relation with others 7 Enjoyment of life 7 What TIME of day is your pain at its worst? morning Sleep (in general) Fair  Pain is worse with: walking, bending, inactivity and standing Pain improves with: rest and medication Relief from Meds: 6  Mobility walk with assistance use a walker how many minutes can you walk? 2 ability to climb steps?  no do you drive?  no use a wheelchair Do you have any goals in this area?  yes  Function Do you have any goals in this area?  no  Neuro/Psych No problems in this area  Prior Studies Any changes since last visit?  no  Physicians involved in your care Any changes since last visit?  no   Family History  Problem Relation Age of Onset  . Cancer Mother   . Diabetes Mother   . Heart disease Mother   . Hypertension Mother   . Cancer Father   . Hypertension Sister   . Hypertension Brother   . Diabetes Brother   . Colon cancer Neg Hx    Social History   Social History  . Marital Status: Divorced    Spouse Name: N/A  . Number of Children: N/A  . Years of Education: N/A   Social History Main Topics  . Smoking status: Current Every Day Smoker -- 0.50 packs/day for 20 years    Types: Cigarettes  . Smokeless tobacco: Never Used     Comment: trying to cut back  . Alcohol Use: No  .  Drug Use: No  . Sexual Activity: Not Asked   Other Topics Concern  . None   Social History Narrative   Past Surgical History  Procedure Laterality Date  . Abdominal hysterectomy    . Total knee arthroplasty    . Cholecystectomy    . Joint replacement      right knee  . Arthroscopies      knee-right x 7  . Colonoscopy with propofol N/A 02/26/2015    Procedure: COLONOSCOPY WITH PROPOFOL;  Surgeon: Inda Castle, MD;  Location: [facility];  Service: Endoscopy;  Laterality: N/A;  . Colonoscopy    . Colon surgery    . Appendectomy      thinks it was removed with hysterectomy   Past Medical History  Diagnosis Date  . Osteoporosis   . Depression   . Hypertension   . Anemia   . Obesity   . Chronic kidney disease   . Hypothyroid     mild- no medication  . Transfusion history     70's, 80's ? hepatitis C attributed to past transfusions  . Hep C w/o coma, chronic ([place])     07-20-14 being presently tx. "Harvoni"-good response per pt. will complete in 7days- Dr. Linus Salmons follows [place]  infectious disease control.  . Hep B w/o coma   . Decreased mobility     hx. Rt. hip "avascular necrosis" -unable to weight bear long periods, "using wheelchair"..  . Superficial thrombophlebitis     left leg  . Knee stiffness     right knee -hard to bend at knee  . Shortness of breath dyspnea     still smokes  . Pneumonia   . GERD (gastroesophageal reflux disease)   . Muscle cramps   . Arthritis   . Complication of anesthesia   . PONV (postoperative nausea and vomiting)     only with Demerol   BP 108/72 mmHg  Pulse 92  Resp 14  SpO2 96%  Opioid Risk Score:   Fall Risk Score:  `1  Depression screen PHQ 2/9  Depression screen [facility] 2/9 10/09/2015 09/11/2015 08/26/2015 05/14/2015 04/17/2015 12/17/2014 11/20/2014  Decreased Interest 2 2 0 3 2 2 1   Down, Depressed, Hopeless 2 2 0 3 2 2 1   PHQ - 2 Score 4 4 0 6 4 4 2   Altered sleeping - - - - - 1 1  Tired, decreased energy - - - - - 2 1    Change in appetite - - - - - 2 0  Feeling bad or failure about yourself  - - - - - 1 1  Trouble concentrating - - - - - 1 0  Moving slowly or fidgety/restless - - - - - 0 0  Suicidal thoughts - - - - - 0 0  PHQ-9 Score - - - - - 11 5     Review of Systems  Constitutional: Negative.   HENT: Negative.   Eyes: Negative.   Respiratory: Negative.   Cardiovascular: Negative.   Gastrointestinal: Negative.   Endocrine: Negative.   Musculoskeletal: Positive for back pain and arthralgias.  Skin: Negative.   Allergic/Immunologic: Negative.   Neurological: Negative.   Hematological: Negative.   Psychiatric/Behavioral: Negative.   All other systems reviewed and are negative.      Objective:   Physical Exam  Constitutional: She is oriented to person, place, and time. She appears well-developed and well-nourished.  HENT:  Head: Normocephalic and atraumatic.  Neck: Normal range of motion. Neck supple.  Cardiovascular: Normal rate and regular rhythm.   Pulmonary/Chest: Effort normal and breath sounds normal.  Musculoskeletal:  Normal Muscle Bulk and Muscle Testing Reveals: Upper Extremities: Full ROM and Muscle Strength 5/5 Lumbar Paraspinal Tenderness: L-5- L-6 Lower Extremities: Decreased ROM and Muscle Strength 5/5 Right Lower Extremity Flexion Produces Pain into Right Hip and Lower Extremity Left Lower Extremity Flexion  Produces Pain into Left Patella Arrived in Motorized Wheelchair  Neurological: She is alert and oriented to person, place, and time.  Skin: Skin is warm and dry.  Psychiatric: She has a normal mood and affect.  Nursing note and vitals reviewed.         Assessment & Plan:  1. Chronic right hip pain due to AVN with joint collapse: Continue oxycodone and Voltaren gel Refilled: Oxycodone 5 mg -use one tablet every 6 hours as needed for moderate pain #110. No more than 4 a day. We will continue the opioid monitoring program, this consists of regular clinic  visits, examinations, urine drug screen, pill counts as well as use of [place] Controlled Substance reporting System. 2. Low back pain: Due to RLE leg length discrepancy.  Continue with current medication regime and heat therapy. 3. Balance deficits with poor endurance: Continue using  her motorized wheelchair to increse her activity.  4. Right Hip Pain: Continue with heat and exercise and voltaren gel regimen. 5. Muscle Spasms: Continue Baclofen  20 minutes of face to face patient care time was spent during this visit. All questions were encouraged and answered.   F/U in 1 month

## 2016-03-04 NOTE — Anesthesia Postprocedure Evaluation (Signed)
Anesthesia Post Note  Patient: [name]  Procedure(s) Performed: Procedure(s) (LRB): COLONOSCOPY WITH PROPOFOL (N/A) ESOPHAGOGASTRODUODENOSCOPY (EGD) WITH PROPOFOL (N/A)  Patient location during evaluation: Endoscopy Anesthesia Type: MAC Level of consciousness: awake and alert Pain management: pain level controlled Vital Signs Assessment: post-procedure vital signs reviewed and stable Respiratory status: spontaneous breathing, nonlabored ventilation and respiratory function stable Cardiovascular status: stable and blood pressure returned to baseline Anesthetic complications: no     Last Vitals:  Filed Vitals:   03/03/16 1150 03/03/16 1200  BP: 134/60 123/55  Pulse: 52 56  Temp:    Resp: 22 18    Last Pain:  Filed Vitals:   03/03/16 1210  PainSc: 3    Pain Goal: Patients Stated Pain Goal: 3 (03/03/16 1200)               Montez Hageman

## 2016-03-12 ENCOUNTER — Encounter: Payer: Self-pay | Admitting: Gastroenterology

## 2016-03-23 ENCOUNTER — Encounter: Payer: Self-pay | Admitting: Physical Medicine & Rehabilitation

## 2016-03-23 ENCOUNTER — Encounter: Payer: Medicare Other | Attending: Physical Medicine and Rehabilitation

## 2016-03-23 ENCOUNTER — Ambulatory Visit (HOSPITAL_BASED_OUTPATIENT_CLINIC_OR_DEPARTMENT_OTHER): Payer: Medicare Other | Admitting: Physical Medicine & Rehabilitation

## 2016-03-23 VITALS — BP 130/76 | HR 89 | Resp 14

## 2016-03-23 DIAGNOSIS — G894 Chronic pain syndrome: Secondary | ICD-10-CM | POA: Diagnosis not present

## 2016-03-23 DIAGNOSIS — G5602 Carpal tunnel syndrome, left upper limb: Secondary | ICD-10-CM | POA: Diagnosis not present

## 2016-03-23 MED ORDER — OXYCODONE HCL 5 MG PO TABS: 5.0000 mg | ORAL_TABLET | Freq: Four times a day (QID) | ORAL | Status: DC | PRN

## 2016-03-23 NOTE — Patient Instructions (Signed)
If the left hand numbness does not improve while wearing a brace. Please let Zella Ball know and she will set you up with the EMG test.

## 2016-03-23 NOTE — Progress Notes (Signed)
Subjective:    Patient ID: [name], female    DOB: 11-22-1955, 60 y.o.   MRN: [name] 60 year old female with history of avascular necrosis. Nonoperative because of difficulty anchoring both the femoral and acetabular component. R TKR hx  Has received a power chair , Patient uses a walker, mainly at home. The power chair is when she leaves the home Leg length discrepancy related to hip HPI  Patient complains of increasing pain down the right leg into the right calf area. This pain occurs both at rest, also with walking. Change of position is helpful. Also, baclofen is helpful. Patient feels like some of this pain is cramping in nature.  Pain Inventory Average Pain 7 Pain Right Now 5 My pain is no selection  In the last 24 hours, has pain interfered with the following? General activity 7 Relation with others 8 Enjoyment of life 8 What TIME of day is your pain at its worst? morning Sleep (in general) Fair  Pain is worse with: walking, bending, inactivity and standing Pain improves with: rest and medication Relief from Meds: 7  Mobility ability to climb steps?  no do you drive?  no use a wheelchair Do you have any goals in this area?  yes  Function Do you have any goals in this area?  no  Neuro/Psych No problems in this area  Prior Studies Any changes since last visit?  no  Physicians involved in your care Any changes since last visit?  no   Family History  Problem Relation Age of Onset  . Cancer Mother   . Diabetes Mother   . Heart disease Mother   . Hypertension Mother   . Cancer Father   . Hypertension Sister   . Hypertension Brother   . Diabetes Brother   . Colon cancer Neg Hx    Social History   Social History  . Marital Status: Divorced    Spouse Name: N/A  . Number of Children: N/A  . Years of Education: N/A   Social History Main Topics  . Smoking status: Current Every Day Smoker -- 0.50 packs/day for 20 years    Types: Cigarettes  .  Smokeless tobacco: Never Used     Comment: trying to cut back  . Alcohol Use: No  . Drug Use: No  . Sexual Activity: Not Asked   Other Topics Concern  . None   Social History Narrative   Past Surgical History  Procedure Laterality Date  . Abdominal hysterectomy    . Total knee arthroplasty    . Cholecystectomy    . Joint replacement      right knee  . Arthroscopies      knee-right x 7  . Colonoscopy with propofol N/A 02/26/2015    Procedure: COLONOSCOPY WITH PROPOFOL;  Surgeon: Inda Castle, MD;  Location: [facility];  Service: Endoscopy;  Laterality: N/A;  . Colonoscopy    . Colon surgery    . Appendectomy      thinks it was removed with hysterectomy  . Colonoscopy with propofol N/A 03/03/2016    Procedure: COLONOSCOPY WITH PROPOFOL;  Surgeon: Mauri Pole, MD;  Location: [facility];  Service: Endoscopy;  Laterality: N/A;  . Esophagogastroduodenoscopy (egd) with propofol N/A 03/03/2016    Procedure: ESOPHAGOGASTRODUODENOSCOPY (EGD) WITH PROPOFOL;  Surgeon: Mauri Pole, MD;  Location: [facility];  Service: Endoscopy;  Laterality: N/A;   Past Medical History  Diagnosis Date  . Osteoporosis   . Depression   .  Hypertension   . Anemia   . Obesity   . Chronic kidney disease   . Hypothyroid     mild- no medication  . Transfusion history     70's, 80's ? hepatitis C attributed to past transfusions  . Hep C w/o coma, chronic ([place])     07-20-14 being presently tx. "Harvoni"-good response per pt. will complete in 7days- Dr. Linus Salmons follows [place] infectious disease control.  . Hep B w/o coma   . Decreased mobility     hx. Rt. hip "avascular necrosis" -unable to weight bear long periods, "using wheelchair"..  . Superficial thrombophlebitis     left leg  . Knee stiffness     right knee -hard to bend at knee  . Shortness of breath dyspnea     still smokes  . Pneumonia   . GERD (gastroesophageal reflux disease)   . Muscle cramps   . Arthritis   .  Complication of anesthesia   . PONV (postoperative nausea and vomiting)     only with Demerol   BP 130/76 mmHg  Pulse 89  Resp 14  SpO2 95%  Opioid Risk Score:   Fall Risk Score:  `1  Depression screen PHQ 2/9  Depression screen [facility] 2/9 10/09/2015 09/11/2015 08/26/2015 05/14/2015 04/17/2015 12/17/2014 11/20/2014  Decreased Interest 2 2 0 3 2 2 1   Down, Depressed, Hopeless 2 2 0 3 2 2 1   PHQ - 2 Score 4 4 0 6 4 4 2   Altered sleeping - - - - - 1 1  Tired, decreased energy - - - - - 2 1  Change in appetite - - - - - 2 0  Feeling bad or failure about yourself  - - - - - 1 1  Trouble concentrating - - - - - 1 0  Moving slowly or fidgety/restless - - - - - 0 0  Suicidal thoughts - - - - - 0 0  PHQ-9 Score - - - - - 11 5     Review of Systems  Constitutional: Negative.   HENT: Negative.   Eyes: Negative.   Respiratory: Negative.   Cardiovascular: Negative.   Gastrointestinal: Negative.   Endocrine: Negative.   Genitourinary: Negative.   Musculoskeletal: Negative.   Skin: Negative.   Allergic/Immunologic: Negative.   Neurological: Negative.   Hematological: Negative.   Psychiatric/Behavioral: Negative.   All other systems reviewed and are negative.      Objective:   Physical Exam  Constitutional: She is oriented to person, place, and time. She appears well-developed and well-nourished.  HENT:  Head: Normocephalic and atraumatic.  Eyes: Conjunctivae and EOM are normal. Pupils are equal, round, and reactive to light.  Neurological: She is alert and oriented to person, place, and time.  Normal sensation to pinprick bilateral L3, L4, L5-S1 dermatomal distribution  Psychiatric: She has a normal mood and affect.  Nursing note and vitals reviewed.  Tenderness to palpation along the medial and lateral joint lines of both knees No lower extremity edema, no erythema. Negative straight leg raising. Motor strength is 5/5 bilateral, knee extensor, ankle dorsiflexor, 4 minus. Hip  flexors Decreased range of motion, right hip internal rotation essentially nil. Right knee extension normal, with right knee flexion limited to 90 Deep tendon reflex 1+ bilateral ankle and 2+ bilateral knee    Assessment & Plan:  1. History of hip avascular necrosis, right greater than left side. Continue oxycodone 3-4 tablets per day, 110 tablets per month Last urine  drug screen was in April and was appropriate. Continue opioid monitoring program. This consists of regular clinic visits, examinations, urine drug screen, pill counts as well as use of [place] controlled substance reporting System.  2. Bilateral knee pain, she does have a history of total knee replacement on the right side with knee contracture  3. Radiating pain into the lower extremities. Mrs. likely referred versus knee pain and hip joint pain. No evidence of radiculopathy

## 2016-04-22 ENCOUNTER — Encounter: Payer: Medicare Other | Attending: Physical Medicine and Rehabilitation | Admitting: Registered Nurse

## 2016-04-22 ENCOUNTER — Encounter: Payer: Self-pay | Admitting: Registered Nurse

## 2016-04-22 VITALS — BP 118/74 | HR 89 | Resp 14

## 2016-04-22 DIAGNOSIS — M8700 Idiopathic aseptic necrosis of unspecified bone: Secondary | ICD-10-CM | POA: Diagnosis not present

## 2016-04-22 DIAGNOSIS — Z79899 Other long term (current) drug therapy: Secondary | ICD-10-CM

## 2016-04-22 DIAGNOSIS — M25551 Pain in right hip: Secondary | ICD-10-CM | POA: Diagnosis not present

## 2016-04-22 DIAGNOSIS — M545 Low back pain, unspecified: Secondary | ICD-10-CM

## 2016-04-22 DIAGNOSIS — G894 Chronic pain syndrome: Secondary | ICD-10-CM | POA: Insufficient documentation

## 2016-04-22 DIAGNOSIS — M24551 Contracture, right hip: Secondary | ICD-10-CM | POA: Diagnosis not present

## 2016-04-22 DIAGNOSIS — M62830 Muscle spasm of back: Secondary | ICD-10-CM

## 2016-04-22 DIAGNOSIS — Z5181 Encounter for therapeutic drug level monitoring: Secondary | ICD-10-CM

## 2016-04-22 MED ORDER — OXYCODONE HCL 5 MG PO TABS: 5.0000 mg | ORAL_TABLET | Freq: Four times a day (QID) | ORAL | 0 refills | Status: DC | PRN

## 2016-04-22 NOTE — Progress Notes (Signed)
Subjective:    Patient ID: [name], female    DOB: 1956/08/16, 60 y.o.   MRN: [name]  HPI: [name] is a 60 year old female who returns for follow up for chronic pain and medication refill. She states her pain is located in her lower back, right hip, right lower extremity and left knee. She rates her pain 6. Her current exercise regime is performing chair exercises daily, using the bands and walking with her walker in her home. Arrived in motorized wheelchair.  Pain Inventory Average Pain 7 Pain Right Now 6 My pain is sharp, dull, stabbing and aching  In the last 24 hours, has pain interfered with the following? General activity 7 Relation with others 7 Enjoyment of life 8 What TIME of day is your pain at its worst? morning Sleep (in general) Fair  Pain is worse with: walking, bending, sitting, inactivity and standing Pain improves with: rest and medication Relief from Meds: 6  Mobility use a wheelchair Do you have any goals in this area?  no  Function Do you have any goals in this area?  no  Neuro/Psych No problems in this area  Prior Studies Any changes since last visit?  no  Physicians involved in your care Any changes since last visit?  no   Family History  Problem Relation Age of Onset  . Cancer Mother   . Diabetes Mother   . Heart disease Mother   . Hypertension Mother   . Cancer Father   . Hypertension Sister   . Hypertension Brother   . Diabetes Brother   . Colon cancer Neg Hx    Social History   Social History  . Marital status: Divorced    Spouse name: N/A  . Number of children: N/A  . Years of education: N/A   Social History Main Topics  . Smoking status: Current Every Day Smoker    Packs/day: 0.50    Years: 20.00    Types: Cigarettes  . Smokeless tobacco: Never Used     Comment: trying to cut back  . Alcohol use No  . Drug use: No  . Sexual activity: Not Asked   Other Topics Concern  . None   Social History  Narrative  . None   Past Surgical History:  Procedure Laterality Date  . ABDOMINAL HYSTERECTOMY    . APPENDECTOMY     thinks it was removed with hysterectomy  . arthroscopies     knee-right x 7  . CHOLECYSTECTOMY    . COLON SURGERY    . COLONOSCOPY    . COLONOSCOPY WITH PROPOFOL N/A 02/26/2015   Procedure: COLONOSCOPY WITH PROPOFOL;  Surgeon: Inda Castle, MD;  Location: [facility];  Service: Endoscopy;  Laterality: N/A;  . COLONOSCOPY WITH PROPOFOL N/A 03/03/2016   Procedure: COLONOSCOPY WITH PROPOFOL;  Surgeon: Mauri Pole, MD;  Location: [facility];  Service: Endoscopy;  Laterality: N/A;  . ESOPHAGOGASTRODUODENOSCOPY (EGD) WITH PROPOFOL N/A 03/03/2016   Procedure: ESOPHAGOGASTRODUODENOSCOPY (EGD) WITH PROPOFOL;  Surgeon: Mauri Pole, MD;  Location: [facility];  Service: Endoscopy;  Laterality: N/A;  . JOINT REPLACEMENT     right knee  . TOTAL KNEE ARTHROPLASTY     Past Medical History:  Diagnosis Date  . Anemia   . Arthritis   . Chronic kidney disease   . Complication of anesthesia   . Decreased mobility    hx. Rt. hip "avascular necrosis" -unable to weight bear long periods, "using wheelchair"..  .  Depression   . GERD (gastroesophageal reflux disease)   . Hep B w/o coma   . Hep C w/o coma, chronic ([place])    07-20-14 being presently tx. "Harv[facility]"-good response per pt. will complete in 7days- Dr. Linus Salmons follows [place] infectious disease control.  . Hypertension   . Hypothyroid    mild- no medication  . Knee stiffness    right knee -hard to bend at knee  . Muscle cramps   . Obesity   . Osteoporosis   . Pneumonia   . PONV (postoperative nausea and vomiting)    only with Demerol  . Shortness of breath dyspnea    still smokes  . Superficial thrombophlebitis    left leg  . Transfusion history    70's, 80's ? hepatitis C attributed to past transfusions   BP 118/74 (BP Location: Left Wrist, Patient Position: Sitting, Cuff Size: Normal)   Pulse  89   Resp 14   SpO2 94%   Opioid Risk Score:   Fall Risk Score:  `1  Depression screen PHQ 2/9  Depression screen [facility] 2/9 10/09/2015 09/11/2015 08/26/2015 05/14/2015 04/17/2015 12/17/2014 11/20/2014  Decreased Interest 2 2 0 3 2 2 1   Down, Depressed, Hopeless 2 2 0 3 2 2 1   PHQ - 2 Score 4 4 0 6 4 4 2   Altered sleeping - - - - - 1 1  Tired, decreased energy - - - - - 2 1  Change in appetite - - - - - 2 0  Feeling bad or failure about yourself  - - - - - 1 1  Trouble concentrating - - - - - 1 0  Moving slowly or fidgety/restless - - - - - 0 0  Suicidal thoughts - - - - - 0 0  PHQ-9 Score - - - - - 11 5    Review of Systems  Constitutional: Negative.   HENT: Negative.   Eyes: Negative.   Respiratory: Negative.   Cardiovascular: Negative.   Gastrointestinal: Negative.   Endocrine: Negative.   Genitourinary: Negative.   Musculoskeletal: Negative.   Skin: Negative.   Allergic/Immunologic: Negative.   Neurological: Negative.   Hematological: Negative.   Psychiatric/Behavioral: Negative.   All other systems reviewed and are negative.      Objective:   Physical Exam  Constitutional: She is oriented to person, place, and time. She appears well-developed and well-nourished.  HENT:  Head: Normocephalic and atraumatic.  Neck: Normal range of motion. Neck supple.  Cardiovascular: Normal rate and regular rhythm.   Pulmonary/Chest: Effort normal and breath sounds normal.  Musculoskeletal:  Normal Muscle Bulk and Muscle Testing Reveals: Upper Extremities: Full ROM and Muscle Strength 5/5 Thoracic paraspinal Tenderness: T-7- T-9 Lumbar Paraspinal Tenderness: L-3- L-5 Mainly Right Side Right Greater Trochanteric Tenderness Lower Extremities: Right Decreased ROm and Muscle Strength 5/5 Right Lower Extremity Flexion Produces Pain Into Lumbar and Right Hip Arrived in Motorized wheelchair   Neurological: She is alert and oriented to person, place, and time.  Skin: Skin is warm and dry.    Psychiatric: She has a normal mood and affect.  Nursing note and vitals reviewed.         Assessment & Plan:  1. Chronic right hip pain due to AVN with joint collapse: Continue oxycodone and Voltaren gel Refilled: Oxycodone 5 mg -use one tablet every 6 hours as needed for moderate pain #110. No more than 4 a day. We will continue the opioid monitoring program, this consists of  regular clinic visits, examinations, urine drug screen, pill counts as well as use of [place] Controlled Substance reporting System. 2. Low back pain: Due to RLE leg length discrepancy.  Continue with current medication regime and heat therapy. 3. Balance deficits with poor endurance: Continue using her motorized wheelchair to increse her activity.  4. Right Hip Pain: Continue with heat and exercise and voltaren gel regimen. 5. Muscle Spasms: Continue Baclofen  20 minutes of face to face patient care time was spent during this visit. All questions were encouraged and answered.   F/U in 1 month

## 2016-04-29 ENCOUNTER — Other Ambulatory Visit: Payer: Self-pay | Admitting: Family Medicine

## 2016-05-28 ENCOUNTER — Encounter: Payer: Medicare Other | Admitting: Registered Nurse

## 2016-06-11 ENCOUNTER — Other Ambulatory Visit: Payer: Self-pay | Admitting: Family Medicine

## 2016-06-11 NOTE — Telephone Encounter (Signed)
Please inform patient that lomotil is ready for pick up

## 2016-06-18 ENCOUNTER — Ambulatory Visit: Payer: Medicare Other | Attending: Family Medicine | Admitting: Family Medicine

## 2016-06-18 ENCOUNTER — Encounter: Payer: Self-pay | Admitting: Family Medicine

## 2016-06-18 VITALS — BP 110/77 | HR 75 | Temp 98.8°F

## 2016-06-18 DIAGNOSIS — E039 Hypothyroidism, unspecified: Secondary | ICD-10-CM | POA: Insufficient documentation

## 2016-06-18 DIAGNOSIS — I10 Essential (primary) hypertension: Secondary | ICD-10-CM | POA: Insufficient documentation

## 2016-06-18 DIAGNOSIS — E559 Vitamin D deficiency, unspecified: Secondary | ICD-10-CM | POA: Diagnosis not present

## 2016-06-18 DIAGNOSIS — Z23 Encounter for immunization: Secondary | ICD-10-CM | POA: Diagnosis not present

## 2016-06-18 DIAGNOSIS — Z79899 Other long term (current) drug therapy: Secondary | ICD-10-CM | POA: Diagnosis not present

## 2016-06-18 DIAGNOSIS — R42 Dizziness and giddiness: Secondary | ICD-10-CM | POA: Diagnosis not present

## 2016-06-18 DIAGNOSIS — Z1231 Encounter for screening mammogram for malignant neoplasm of breast: Secondary | ICD-10-CM | POA: Diagnosis not present

## 2016-06-18 DIAGNOSIS — K529 Noninfective gastroenteritis and colitis, unspecified: Secondary | ICD-10-CM | POA: Insufficient documentation

## 2016-06-18 DIAGNOSIS — F17210 Nicotine dependence, cigarettes, uncomplicated: Secondary | ICD-10-CM | POA: Diagnosis not present

## 2016-06-18 LAB — COMPLETE METABOLIC PANEL WITH GFR
ALT: 9 U/L (ref 6–29)
AST: 17 U/L (ref 10–35)
Albumin: 3.8 g/dL (ref 3.6–5.1)
Alkaline Phosphatase: 71 U/L (ref 33–130)
BILIRUBIN TOTAL: 0.5 mg/dL (ref 0.2–1.2)
BUN: 9 mg/dL (ref 7–25)
CHLORIDE: 104 mmol/L (ref 98–110)
CO2: 24 mmol/L (ref 20–31)
Calcium: 8.9 mg/dL (ref 8.6–10.4)
Creat: 0.56 mg/dL (ref 0.50–0.99)
GLUCOSE: 119 mg/dL — AB (ref 65–99)
Potassium: 4.1 mmol/L (ref 3.5–5.3)
SODIUM: 138 mmol/L (ref 135–146)
TOTAL PROTEIN: 7.7 g/dL (ref 6.1–8.1)

## 2016-06-18 LAB — CBC
HEMATOCRIT: 39.3 % (ref 35.0–45.0)
Hemoglobin: 13.1 g/dL (ref 11.7–15.5)
MCH: 30 pg (ref 27.0–33.0)
MCHC: 33.3 g/dL (ref 32.0–36.0)
MCV: 90.1 fL (ref 80.0–100.0)
MPV: 11.1 fL (ref 7.5–12.5)
PLATELETS: 162 10*3/uL (ref 140–400)
RBC: 4.36 MIL/uL (ref 3.80–5.10)
RDW: 13.8 % (ref 11.0–15.0)
WBC: 8.1 10*3/uL (ref 3.8–10.8)

## 2016-06-18 LAB — TSH: TSH: 1.89 m[IU]/L

## 2016-06-18 MED ORDER — ZOSTER VACCINE LIVE 19400 UNT/0.65ML ~~LOC~~ SUSR: 0.6500 mL | Freq: Once | SUBCUTANEOUS | 0 refills | Status: AC

## 2016-06-18 NOTE — Progress Notes (Signed)
Subjective:  Patient ID: [name], female    DOB: March 12, 1956  Age: 60 y.o. MRN: [id number]  CC: Hypertension   HPI [name] has hypothyroidism she  presents for   1. CHRONIC HYPERTENSION  Disease Monitoring  Blood pressure range: not checking   Chest pain: no   Dyspnea: yes, at times    Claudication: no   Medication compliance: yes  Medication Side Effects  Lightheadedness: no   Urinary frequency: no   Edema: yes     Social History  Substance Use Topics  . Smoking status: Current Every Day Smoker    Packs/day: 0.50    Years: 20.00    Types: Cigarettes  . Smokeless tobacco: Never Used     Comment: trying to cut back  . Alcohol use No    Outpatient Medications Prior to Visit  Medication Sig Dispense Refill  . amLODipine (NORVASC) 5 MG tablet Take 1 tablet (5 mg total) by mouth daily. (Patient taking differently: Take 5 mg by mouth every evening. ) 90 tablet 3  . baclofen (LIORESAL) 10 MG tablet Take 0.5 tablets (5 mg total) by mouth 2 (two) times daily as needed for muscle spasms. 60 tablet 2  . diclofenac sodium (VOLTAREN) 1 % GEL Apply 2 g topically 4 (four) times daily as needed (pain). 2 Tube 2  . diphenoxylate-atropine (LOMOTIL) 2.5-0.025 MG tablet TAKE 1 TABLET BY MOUTH FOUR TIMES A DAY AS NEEDED FOR DIARRHEA OR LOOSE STOOLS 30 tablet 5  . levothyroxine (SYNTHROID, LEVOTHROID) 50 MCG tablet Take 1 tablet (50 mcg total) by mouth daily. 30 tablet 6  . lisinopril (PRINIVIL,ZESTRIL) 40 MG tablet Take 1 tablet (40 mg total) by mouth daily. (Patient taking differently: Take 40 mg by mouth every evening. ) 90 tablet 3  . miconazole (MICOTIN) 2 % cream APPLY TOPICALLY AS DIRECTED TWICE A DAY 30 g 3  . ranitidine (ZANTAC) 300 MG capsule Take 1 capsule (300 mg total) by mouth at bedtime as needed for heartburn. Reported on 08/26/2015 30 capsule 11  . sertraline (ZOLOFT) 50 MG tablet Take 1 tablet (50 mg total) by mouth daily. (Patient taking differently: Take 50 mg  by mouth daily. Takes 1/2 tablet in AM and 1/2 tablet in PM) 30 tablet 3  . Na Sulfate-K Sulfate-Mg Sulf (SUPREP BOWEL PREP KIT) 17.5-3.13-1.6 GM/180ML SOLN Take 1 kit by mouth once. (Patient not taking: Reported on 06/18/2016) 1 Bottle 0  . omeprazole (PRILOSEC) 40 MG capsule Take 1 capsule (40 mg total) by mouth daily. (Patient not taking: Reported on 06/18/2016) 30 capsule 11  . oxyCODONE (OXY IR/ROXICODONE) 5 MG immediate release tablet Take 1 tablet (5 mg total) by mouth every 6 (six) hours as needed for severe pain. One month supply (Patient not taking: Reported on 06/18/2016) 110 tablet 0   No facility-administered medications prior to visit.     ROS Review of Systems  Constitutional: Negative for chills and fever.  Eyes: Negative for visual disturbance.  Respiratory: Negative for shortness of breath.   Cardiovascular: Positive for leg swelling. Negative for chest pain.  Gastrointestinal: Negative for abdominal pain, blood in stool and diarrhea.  Musculoskeletal: Negative for arthralgias and back pain.  Skin: Negative for rash.  Allergic/Immunologic: Negative for immunocompromised state.  Neurological: Positive for dizziness and numbness. Negative for facial asymmetry, speech difficulty and weakness.  Hematological: Negative for adenopathy. Does not bruise/bleed easily.  Psychiatric/Behavioral: Negative for dysphoric mood and suicidal ideas.   Objective:  BP 110/77 (BP Location:  Left Arm, Patient Position: Sitting, Cuff Size: Large)   Pulse 75   Temp 98.8 F (37.1 C) (Oral)   SpO2 96%   Wt Readings from Last 3 Encounters:  03/03/16 (!) 316 lb (143.3 kg)  02/14/16 (!) 316 lb (143.3 kg)  12/24/15 (!) 317 lb (143.8 kg)   BP/Weight 06/18/2016 04/22/2016 1/44/8185  Systolic BP 631 497 026  Diastolic BP 77 74 76  Wt. (Lbs) - - -  BMI - - -    Physical Exam  Constitutional: She is oriented to person, place, and time. She appears well-developed and well-nourished. No distress.    Morbidly obese in motorized scooter   HENT:  Head: Normocephalic and atraumatic.  Cardiovascular: Normal rate, regular rhythm, normal heart sounds and intact distal pulses.   Pulses:      Radial pulses are 2+ on the right side, and 2+ on the left side.  Pulmonary/Chest: Effort normal and breath sounds normal.  Musculoskeletal: She exhibits edema (trace in feet and ankles ).       Left hand: Decreased sensation noted. Normal strength noted.  Decreased sensation at all finger tips   Neurological: She is alert and oriented to person, place, and time. She exhibits normal muscle tone.  Skin: Skin is warm and dry. No rash noted.  Psychiatric: She has a normal mood and affect.   Lab Results  Component Value Date   HGBA1C 5.70 08/26/2015   Lab Results  Component Value Date   TSH 3.96 01/20/2016     Assessment & Plan:  [name] was seen today for hypertension.  Diagnoses and all orders for this visit:  Essential hypertension -     COMPLETE METABOLIC PANEL WITH GFR  Chronic diarrhea  Hypothyroidism, unspecified type -     TSH  Visit for screening mammogram -     MM DIGITAL SCREENING BILATERAL; Future  Need for shingles vaccine -     Zoster Vaccine Live, PF, (ZOSTAVAX) 37858 UNT/0.65ML injection; Inject 19,400 Units into the skin once.  Dizzy spells -     CBC  Vitamin D deficiency -     Vitamin D, 25-hydroxy  Encounter for immunization -     Flu Vaccine QUAD 36+ mos IM   There are no diagnoses linked to this encounter.  No orders of the defined types were placed in this encounter.   Follow-up: No Follow-up on file.   Boykin Nearing MD

## 2016-06-18 NOTE — Patient Instructions (Addendum)
[name] was seen today for hypertension.  Diagnoses and all orders for this visit:  Essential hypertension -     COMPLETE METABOLIC PANEL WITH GFR  Chronic diarrhea  Hypothyroidism, unspecified type -     TSH  Visit for screening mammogram -     MM DIGITAL SCREENING BILATERAL; Future  Need for shingles vaccine -     Zoster Vaccine Live, PF, (ZOSTAVAX) 30160 UNT/0.65ML injection; Inject 19,400 Units into the skin once.  Dizzy spells -     CBC  Vitamin D deficiency -     Vitamin D, 25-hydroxy  you will be contacted with las results  F/u in 6 months for HTN and hypothyroidism   Dr. Adrian Blackwater

## 2016-06-18 NOTE — Progress Notes (Signed)
Pt is getting flu shot.

## 2016-06-19 ENCOUNTER — Other Ambulatory Visit: Payer: Self-pay | Admitting: Family Medicine

## 2016-06-19 DIAGNOSIS — E559 Vitamin D deficiency, unspecified: Secondary | ICD-10-CM

## 2016-06-19 LAB — VITAMIN D 25 HYDROXY (VIT D DEFICIENCY, FRACTURES): Vit D, 25-Hydroxy: 11 ng/mL — ABNORMAL LOW (ref 30–100)

## 2016-06-19 MED ORDER — ERGOCALCIFEROL 1.25 MG (50000 UT) PO CAPS: 50000.0000 [IU] | ORAL_CAPSULE | ORAL | 0 refills | Status: DC

## 2016-06-23 NOTE — Assessment & Plan Note (Signed)
Well-controlled.  Continue current regimen. 

## 2016-09-02 ENCOUNTER — Other Ambulatory Visit: Payer: Self-pay | Admitting: Family Medicine

## 2016-09-02 DIAGNOSIS — E559 Vitamin D deficiency, unspecified: Secondary | ICD-10-CM

## 2016-10-21 ENCOUNTER — Other Ambulatory Visit: Payer: Self-pay | Admitting: Family Medicine

## 2016-10-21 DIAGNOSIS — E039 Hypothyroidism, unspecified: Secondary | ICD-10-CM

## 2017-01-20 ENCOUNTER — Encounter: Payer: Self-pay | Admitting: Family Medicine

## 2017-01-25 ENCOUNTER — Telehealth: Payer: Self-pay

## 2017-01-25 ENCOUNTER — Ambulatory Visit: Payer: Medicare Other | Attending: Family Medicine | Admitting: Family Medicine

## 2017-01-25 ENCOUNTER — Encounter: Payer: Self-pay | Admitting: Family Medicine

## 2017-01-25 VITALS — BP 104/65 | HR 81 | Temp 98.1°F | Wt 312.0 lb

## 2017-01-25 DIAGNOSIS — M8700 Idiopathic aseptic necrosis of unspecified bone: Secondary | ICD-10-CM | POA: Diagnosis not present

## 2017-01-25 DIAGNOSIS — E039 Hypothyroidism, unspecified: Secondary | ICD-10-CM | POA: Diagnosis not present

## 2017-01-25 DIAGNOSIS — E785 Hyperlipidemia, unspecified: Secondary | ICD-10-CM

## 2017-01-25 DIAGNOSIS — I10 Essential (primary) hypertension: Secondary | ICD-10-CM | POA: Diagnosis not present

## 2017-01-25 DIAGNOSIS — F17210 Nicotine dependence, cigarettes, uncomplicated: Secondary | ICD-10-CM | POA: Insufficient documentation

## 2017-01-25 DIAGNOSIS — R42 Dizziness and giddiness: Secondary | ICD-10-CM | POA: Insufficient documentation

## 2017-01-25 DIAGNOSIS — Z1231 Encounter for screening mammogram for malignant neoplasm of breast: Secondary | ICD-10-CM | POA: Diagnosis not present

## 2017-01-25 DIAGNOSIS — E2839 Other primary ovarian failure: Secondary | ICD-10-CM | POA: Diagnosis not present

## 2017-01-25 DIAGNOSIS — L821 Other seborrheic keratosis: Secondary | ICD-10-CM | POA: Diagnosis not present

## 2017-01-25 DIAGNOSIS — Z79899 Other long term (current) drug therapy: Secondary | ICD-10-CM | POA: Diagnosis not present

## 2017-01-25 DIAGNOSIS — B079 Viral wart, unspecified: Secondary | ICD-10-CM | POA: Diagnosis not present

## 2017-01-25 DIAGNOSIS — K529 Noninfective gastroenteritis and colitis, unspecified: Secondary | ICD-10-CM | POA: Diagnosis not present

## 2017-01-25 MED ORDER — DIPHENOXYLATE-ATROPINE 2.5-0.025 MG PO TABS: ORAL_TABLET | ORAL | 5 refills | Status: DC

## 2017-01-25 MED ORDER — AMLODIPINE BESYLATE 2.5 MG PO TABS: 2.5000 mg | ORAL_TABLET | Freq: Every day | ORAL | 3 refills | Status: DC

## 2017-01-25 NOTE — Progress Notes (Addendum)
Subjective:  Patient ID: [name], female    DOB: 09-24-1955  Age: 61 y.o. MRN: [id number]  CC: Hypertension and Hypothyroidism   HPI Mahala R Mcfate has hypothyroidism she  presents for   1. CHRONIC HYPERTENSION  Disease Monitoring  Blood pressure range: not checking   Chest pain: no   Dyspnea: no   Claudication: no   Medication compliance: yes  Medication Side Effects  Lightheadedness: no, she does have dizziness when she turns her head to the R after taking her medication.   Urinary frequency: no   Edema: no   2. Seborrheic keratoses: she has some on arms and legs. There is an area on the back of her R leg that hurst. She has had pain for 5-6 months that comes and goes. There is no redness, swelling, bleeding or trauma.   3. Avascular necrosis or R hip: this is chronic. She is obese. She ambulates via walker or wheelchair. She has difficulty cleaning her home due to her dependence on a walker. She reports her pain levels are stable. She feels that her mobility is worsening.   4. chronic diarrhea: improved with lomotil. She has much less stool frequency. She is pleased with the results.    Social History  Substance Use Topics  . Smoking status: Current Every Day Smoker    Packs/day: 0.50    Years: 20.00    Types: Cigarettes  . Smokeless tobacco: Never Used     Comment: trying to cut back  . Alcohol use No    Outpatient Medications Prior to Visit  Medication Sig Dispense Refill  . amLODipine (NORVASC) 5 MG tablet Take 1 tablet (5 mg total) by mouth daily. (Patient taking differently: Take 5 mg by mouth every evening. ) 90 tablet 3  . baclofen (LIORESAL) 10 MG tablet Take 0.5 tablets (5 mg total) by mouth 2 (two) times daily as needed for muscle spasms. 60 tablet 2  . diclofenac sodium (VOLTAREN) 1 % GEL Apply 2 g topically 4 (four) times daily as needed (pain). 2 Tube 2  . diphenoxylate-atropine (LOMOTIL) 2.5-0.025 MG tablet TAKE 1 TABLET BY MOUTH FOUR TIMES A DAY  AS NEEDED FOR DIARRHEA OR LOOSE STOOLS 30 tablet 5  . ergocalciferol (DRISDOL) 50000 units capsule Take 1 capsule (50,000 Units total) by mouth once a week. 9 capsule 0  . levothyroxine (SYNTHROID, LEVOTHROID) 50 MCG tablet TAKE 1 TABLET (50 MCG TOTAL) BY MOUTH DAILY. 30 tablet 5  . lisinopril (PRINIVIL,ZESTRIL) 40 MG tablet Take 1 tablet (40 mg total) by mouth daily. (Patient taking differently: Take 40 mg by mouth every evening. ) 90 tablet 3  . miconazole (MICOTIN) 2 % cream APPLY TOPICALLY AS DIRECTED TWICE A DAY 30 g 3  . omeprazole (PRILOSEC) 40 MG capsule Take 1 capsule (40 mg total) by mouth daily. (Patient not taking: Reported on 06/18/2016) 30 capsule 11  . ranitidine (ZANTAC) 300 MG capsule Take 1 capsule (300 mg total) by mouth at bedtime as needed for heartburn. Reported on 08/26/2015 30 capsule 11  . sertraline (ZOLOFT) 50 MG tablet Take 1 tablet (50 mg total) by mouth daily. (Patient taking differently: Take 50 mg by mouth daily. Takes 1/2 tablet in AM and 1/2 tablet in PM) 30 tablet 3   No facility-administered medications prior to visit.     ROS Review of Systems  Constitutional: Negative for chills and fever.  Eyes: Negative for visual disturbance.  Respiratory: Negative for shortness of breath.   Cardiovascular:  Negative for chest pain and leg swelling.  Gastrointestinal: Negative for abdominal pain, blood in stool and diarrhea.  Musculoskeletal: Positive for arthralgias. Negative for back pain.  Skin: Negative for rash.  Allergic/Immunologic: Negative for immunocompromised state.  Neurological: Positive for numbness. Negative for facial asymmetry, speech difficulty and weakness.  Hematological: Negative for adenopathy. Does not bruise/bleed easily.  Psychiatric/Behavioral: Negative for dysphoric mood and suicidal ideas.   Objective:  BP 104/65   Pulse 81   Temp 98.1 F (36.7 C) (Oral)   Wt (!) 312 lb (141.5 kg)   SpO2 97%   BMI 50.36 kg/m   Wt Readings from Last  3 Encounters:  01/25/17 (!) 312 lb (141.5 kg)  03/03/16 (!) 316 lb (143.3 kg)  02/14/16 (!) 316 lb (143.3 kg)     BP/Weight 01/25/2017 06/18/2016 12/14/8117  Systolic BP 147 829 562  Diastolic BP 65 77 74  Wt. (Lbs) 312 - -  BMI 50.36 - -    Physical Exam  Constitutional: She is oriented to person, place, and time. She appears well-developed and well-nourished. No distress.  Morbidly obese in motorized scooter   HENT:  Head: Normocephalic and atraumatic.  Neck: Carotid bruit is not present.  Cardiovascular: Normal rate, regular rhythm, normal heart sounds and intact distal pulses.   Pulses:      Carotid pulses are 2+ on the right side, and 2+ on the left side.      Radial pulses are 2+ on the right side, and 2+ on the left side.  Pulmonary/Chest: Effort normal and breath sounds normal.  Musculoskeletal: She exhibits edema (trace in feet and ankles ).       Left hand: Decreased sensation noted. Normal strength noted.  Decreased sensation at all finger tips   Neurological: She is alert and oriented to person, place, and time. She exhibits normal muscle tone.  Skin: Skin is warm and dry. No rash noted.     Psychiatric: She has a normal mood and affect.   Lab Results  Component Value Date   HGBA1C 5.70 08/26/2015   Lab Results  Component Value Date   TSH 1.89 06/18/2016    Assessment & Plan:  Hideko was seen today for hypertension and hypothyroidism.  Diagnoses and all orders for this visit:  Essential hypertension -     Comprehensive metabolic panel -     Lipid Panel  Hypothyroidism, unspecified type -     TSH  Morbid obesity (HCC)  Estrogen deficiency -     DG Bone Density; Future  Visit for screening mammogram -     MM DIGITAL SCREENING BILATERAL; Future  AVN (avascular necrosis of bone) (HCC) -     DG HIP UNILAT WITH PELVIS MIN 4 VIEWS RIGHT; Future -     Ambulatory referral to [place] hypertension, benign -     amLODipine (NORVASC) 2.5  MG tablet; Take 1 tablet (2.5 mg total) by mouth daily.  Chronic diarrhea  Seborrheic keratoses  Filiform wart  Other orders -     diphenoxylate-atropine (LOMOTIL) 2.5-0.025 MG tablet; TAKE 1 TABLET BY MOUTH FOUR TIMES A DAY AS NEEDED FOR DIARRHEA OR LOOSE STOOLS   There are no diagnoses linked to this encounter.  No orders of the defined types were placed in this encounter.   Follow-up: Return in about 2 weeks (around 02/08/2017) for cryotherapy and filiform wart removal.   Boykin Nearing MD

## 2017-01-25 NOTE — Assessment & Plan Note (Signed)
Follow up x-ray Home health ordered

## 2017-01-25 NOTE — Assessment & Plan Note (Signed)
Refilled lomotil

## 2017-01-25 NOTE — Assessment & Plan Note (Signed)
A; well controlled with positional dizziness P: Decrease Norvasc to 2.5 mg daily Continue lisinopril 40 mg daily

## 2017-01-25 NOTE — Telephone Encounter (Signed)
Referral received from Dr Adrian Blackwater for home health nursing. Call placed to the patient to inquire if she has a preference for a home health agency. She chose [facility],.  Call placed to [facility] # [phone].  Spoke to Corley that she is not sure of availability and requested the referral be faxed to # [phone].

## 2017-01-25 NOTE — Patient Instructions (Addendum)
[name] was seen today for hypertension and hypothyroidism.  Diagnoses and all orders for this visit:  Essential hypertension -     Comprehensive metabolic panel -     Lipid Panel  Hypothyroidism, unspecified type -     TSH  Morbid obesity (HCC)  Estrogen deficiency -     DG Bone Density; Future  Visit for screening mammogram -     MM DIGITAL SCREENING BILATERAL; Future  AVN (avascular necrosis of bone) (HCC) -     DG HIP UNILAT WITH PELVIS MIN 4 VIEWS RIGHT; Future -     Ambulatory referral to [place] hypertension, benign -     amLODipine (NORVASC) 2.5 MG tablet; Take 1 tablet (2.5 mg total) by mouth daily.  Other orders -     diphenoxylate-atropine (LOMOTIL) 2.5-0.025 MG tablet; TAKE 1 TABLET BY MOUTH FOUR TIMES A DAY AS NEEDED FOR DIARRHEA OR LOOSE STOOLS   F/u in 2-3  weeks for cryotherapy of R lower leg and removal of cutaneous horn from chest, 30 minutes   Dr. Adrian Blackwater

## 2017-01-26 ENCOUNTER — Telehealth: Payer: Self-pay

## 2017-01-26 LAB — COMPREHENSIVE METABOLIC PANEL
ALK PHOS: 75 IU/L (ref 39–117)
ALT: 13 IU/L (ref 0–32)
AST: 18 IU/L (ref 0–40)
Albumin/Globulin Ratio: 1.1 — ABNORMAL LOW (ref 1.2–2.2)
Albumin: 4.1 g/dL (ref 3.6–4.8)
BUN/Creatinine Ratio: 19 (ref 12–28)
BUN: 12 mg/dL (ref 8–27)
Bilirubin Total: 0.3 mg/dL (ref 0.0–1.2)
CALCIUM: 9 mg/dL (ref 8.7–10.3)
CO2: 22 mmol/L (ref 18–29)
CREATININE: 0.64 mg/dL (ref 0.57–1.00)
Chloride: 105 mmol/L (ref 96–106)
GFR calc Af Amer: 111 mL/min/{1.73_m2} (ref >59)
GFR, EST NON AFRICAN AMERICAN: 96 mL/min/{1.73_m2} (ref >59)
GLUCOSE: 96 mg/dL (ref 65–99)
Globulin, Total: 3.6 g/dL (ref 1.5–4.5)
Potassium: 4.3 mmol/L (ref 3.5–5.2)
Sodium: 141 mmol/L (ref 134–144)
Total Protein: 7.7 g/dL (ref 6.0–8.5)

## 2017-01-26 LAB — LIPID PANEL
CHOL/HDL RATIO: 4.3 ratio (ref 0.0–4.4)
CHOLESTEROL TOTAL: 163 mg/dL (ref 100–199)
HDL: 38 mg/dL — AB (ref >39)
LDL CALC: 102 mg/dL — AB (ref 0–99)
TRIGLYCERIDES: 117 mg/dL (ref 0–149)
VLDL CHOLESTEROL CAL: 23 mg/dL (ref 5–40)

## 2017-01-26 LAB — TSH: TSH: 4.57 u[IU]/mL — ABNORMAL HIGH (ref 0.450–4.500)

## 2017-01-26 NOTE — Telephone Encounter (Signed)
Referral for home health care faxed to [facility] - fax # [phone]

## 2017-01-27 ENCOUNTER — Telehealth: Payer: Self-pay

## 2017-01-27 NOTE — Telephone Encounter (Signed)
Call placed to [facility] , spoke to [facility] who stated that they accepted the referral and the patient is scheduled to start home health services tomorrow - 01/28/17

## 2017-02-01 DIAGNOSIS — E785 Hyperlipidemia, unspecified: Secondary | ICD-10-CM | POA: Insufficient documentation

## 2017-02-01 MED ORDER — PRAVASTATIN SODIUM 40 MG PO TABS: 40.0000 mg | ORAL_TABLET | Freq: Every day | ORAL | 3 refills | Status: DC

## 2017-02-01 MED ORDER — LEVOTHYROXINE SODIUM 75 MCG PO TABS: 75.0000 ug | ORAL_TABLET | Freq: Every day | ORAL | 5 refills | Status: DC

## 2017-02-01 NOTE — Assessment & Plan Note (Signed)
Lab Results  Component Value Date   TSH 4.570 (H) 01/25/2017   Increase synthroid from 50 mcg to 75 mcg daily

## 2017-02-01 NOTE — Addendum Note (Signed)
Addended by: Boykin Nearing on: 02/01/2017 03:59 PM   Modules accepted: Orders

## 2017-02-01 NOTE — Assessment & Plan Note (Signed)
10 year CVD risk 7.7% Moderate intensity statin recommended  Start pravastatin 40 mg daily

## 2017-02-10 ENCOUNTER — Telehealth: Payer: Self-pay

## 2017-02-10 NOTE — Telephone Encounter (Signed)
Pt was called and informed of lab results. 

## 2017-02-10 NOTE — Telephone Encounter (Signed)
Pt returned phone call and was informed of lab results and medication.

## 2017-02-12 ENCOUNTER — Ambulatory Visit
Admission: RE | Admit: 2017-02-12 | Discharge: 2017-02-12 | Disposition: A | Discharge status: Home / Self Care | Payer: Medicare Other | Source: Ambulatory Visit | Attending: Family Medicine | Admitting: Family Medicine

## 2017-02-12 DIAGNOSIS — Z780 Asymptomatic menopausal state: Secondary | ICD-10-CM | POA: Diagnosis not present

## 2017-02-12 DIAGNOSIS — M8589 Other specified disorders of bone density and structure, multiple sites: Secondary | ICD-10-CM | POA: Diagnosis not present

## 2017-02-12 DIAGNOSIS — Z1231 Encounter for screening mammogram for malignant neoplasm of breast: Secondary | ICD-10-CM | POA: Diagnosis not present

## 2017-02-12 DIAGNOSIS — E2839 Other primary ovarian failure: Secondary | ICD-10-CM

## 2017-02-15 ENCOUNTER — Other Ambulatory Visit: Payer: Self-pay | Admitting: Family Medicine

## 2017-02-15 DIAGNOSIS — M8580 Other specified disorders of bone density and structure, unspecified site: Secondary | ICD-10-CM

## 2017-02-15 MED ORDER — VITAMIN D (ERGOCALCIFEROL) 1.25 MG (50000 UNIT) PO CAPS: 50000.0000 [IU] | ORAL_CAPSULE | ORAL | 3 refills | Status: DC

## 2017-02-15 MED ORDER — CALCIUM CITRATE 200 MG PO TABS: 400.0000 mg | ORAL_TABLET | Freq: Every day | ORAL | 3 refills | Status: DC

## 2017-02-15 NOTE — Assessment & Plan Note (Signed)
A: osteopenia on bone density scant  P: Calcium citrate and vit D supplement

## 2017-02-16 ENCOUNTER — Ambulatory Visit (HOSPITAL_COMMUNITY)
Admission: RE | Admit: 2017-02-16 | Discharge: 2017-02-16 | Disposition: A | Discharge status: Home / Self Care | Payer: Medicare Other | Source: Ambulatory Visit | Attending: Family Medicine | Admitting: Family Medicine

## 2017-02-16 ENCOUNTER — Other Ambulatory Visit: Payer: Self-pay | Admitting: Family Medicine

## 2017-02-16 DIAGNOSIS — M8700 Idiopathic aseptic necrosis of unspecified bone: Secondary | ICD-10-CM | POA: Insufficient documentation

## 2017-02-16 DIAGNOSIS — Q6589 Other specified congenital deformities of hip: Secondary | ICD-10-CM | POA: Insufficient documentation

## 2017-02-16 DIAGNOSIS — M87351 Other secondary osteonecrosis, right femur: Secondary | ICD-10-CM | POA: Diagnosis not present

## 2017-03-03 ENCOUNTER — Telehealth: Payer: Self-pay

## 2017-03-03 NOTE — Telephone Encounter (Signed)
Pt was called and informed of xray results and bone density scan results.

## 2017-03-05 ENCOUNTER — Ambulatory Visit: Payer: Medicare Other | Attending: Family Medicine | Admitting: Family Medicine

## 2017-03-05 ENCOUNTER — Encounter: Payer: Self-pay | Admitting: Family Medicine

## 2017-03-05 VITALS — BP 111/78 | HR 82 | Temp 98.2°F

## 2017-03-05 DIAGNOSIS — B078 Other viral warts: Secondary | ICD-10-CM | POA: Diagnosis not present

## 2017-03-05 DIAGNOSIS — B079 Viral wart, unspecified: Secondary | ICD-10-CM | POA: Diagnosis not present

## 2017-03-05 DIAGNOSIS — E559 Vitamin D deficiency, unspecified: Secondary | ICD-10-CM | POA: Diagnosis not present

## 2017-03-05 DIAGNOSIS — M8580 Other specified disorders of bone density and structure, unspecified site: Secondary | ICD-10-CM | POA: Diagnosis not present

## 2017-03-05 DIAGNOSIS — L821 Other seborrheic keratosis: Secondary | ICD-10-CM | POA: Diagnosis not present

## 2017-03-05 DIAGNOSIS — F17210 Nicotine dependence, cigarettes, uncomplicated: Secondary | ICD-10-CM | POA: Diagnosis not present

## 2017-03-05 MED ORDER — VITAMIN D (ERGOCALCIFEROL) 1.25 MG (50000 UNIT) PO CAPS
50000.0000 [IU] | ORAL_CAPSULE | ORAL | 3 refills | Status: DC
Start: 2017-03-05 — End: 2017-03-10

## 2017-03-05 MED ORDER — CALCIUM CITRATE 200 MG PO TABS: 400.0000 mg | ORAL_TABLET | Freq: Every day | ORAL | 3 refills | Status: DC

## 2017-03-05 NOTE — Assessment & Plan Note (Signed)
Calcium and vit D  Weight bearing exercises as tolerated Checking Vit D level

## 2017-03-05 NOTE — Progress Notes (Signed)
Subjective:  Patient ID: [name], female    DOB: April 03, 1956  Age: 61 y.o. MRN: [id number]  CC: Skin Problem   HPI [name] presents for   1. Treatment of seborrheic keratoses: she is here today for cryotherapy.   2. Filiform wart removal: anterior neck for 6 months. Painless. She is here today for removal.   3. Review bone density scan: has osteopenia on recent bone density scan. Has low back pain. Has known avascular necrosis in hip.  Has not started calcium or vitamin D. Has history of vitamin D deficiency.   Social History  Substance Use Topics  . Smoking status: Current Every Day Smoker    Packs/day: 0.50    Years: 20.00    Types: Cigarettes  . Smokeless tobacco: Never Used     Comment: trying to cut back  . Alcohol use No    Outpatient Medications Prior to Visit  Medication Sig Dispense Refill  . amLODipine (NORVASC) 2.5 MG tablet Take 1 tablet (2.5 mg total) by mouth daily. 90 tablet 3  . diclofenac sodium (VOLTAREN) 1 % GEL Apply 2 g topically 4 (four) times daily as needed (pain). 2 Tube 2  . diphenoxylate-atropine (LOMOTIL) 2.5-0.025 MG tablet TAKE 1 TABLET BY MOUTH FOUR TIMES A DAY AS NEEDED FOR DIARRHEA OR LOOSE STOOLS 30 tablet 5  . levothyroxine (SYNTHROID, LEVOTHROID) 75 MCG tablet Take 1 tablet (75 mcg total) by mouth daily. 30 tablet 5  . lisinopril (PRINIVIL,ZESTRIL) 40 MG tablet Take 1 tablet (40 mg total) by mouth daily. (Patient taking differently: Take 40 mg by mouth every evening. ) 90 tablet 3  . miconazole (MICOTIN) 2 % cream APPLY TOPICALLY AS DIRECTED TWICE A DAY 30 g 3  . pravastatin (PRAVACHOL) 40 MG tablet Take 1 tablet (40 mg total) by mouth daily. 90 tablet 3  . ranitidine (ZANTAC) 300 MG capsule Take 1 capsule (300 mg total) by mouth at bedtime as needed for heartburn. Reported on 08/26/2015 30 capsule 11  . sertraline (ZOLOFT) 50 MG tablet Take 1 tablet (50 mg total) by mouth daily. (Patient taking differently: Take 50 mg by mouth  daily. Takes 1/2 tablet in AM and 1/2 tablet in PM) 30 tablet 3  . Vitamin D, Ergocalciferol, (DRISDOL) 50000 units CAPS capsule Take 1 capsule (50,000 Units total) by mouth every 30 (thirty) days. 3 capsule 3  . baclofen (LIORESAL) 10 MG tablet Take 0.5 tablets (5 mg total) by mouth 2 (two) times daily as needed for muscle spasms. (Patient not taking: Reported on 03/05/2017) 60 tablet 2  . Calcium Citrate 200 MG TABS Take 2 tablets (400 mg total) by mouth daily. (Patient not taking: Reported on 03/05/2017) 180 tablet 3  . omeprazole (PRILOSEC) 40 MG capsule Take 1 capsule (40 mg total) by mouth daily. (Patient not taking: Reported on 06/18/2016) 30 capsule 11   No facility-administered medications prior to visit.     ROS Review of Systems  Constitutional: Negative for chills and fever.  Eyes: Negative for visual disturbance.  Respiratory: Negative for shortness of breath.   Cardiovascular: Negative for chest pain and leg swelling.  Gastrointestinal: Negative for abdominal pain, blood in stool and diarrhea.  Musculoskeletal: Positive for arthralgias. Negative for back pain.  Skin: Negative for rash.  Allergic/Immunologic: Negative for immunocompromised state.  Neurological: Positive for numbness. Negative for facial asymmetry, speech difficulty and weakness.  Hematological: Negative for adenopathy. Does not bruise/bleed easily.  Psychiatric/Behavioral: Negative for dysphoric mood and suicidal ideas.  Objective:  BP 111/78   Pulse 82   Temp 98.2 F (36.8 C) (Oral)   SpO2 96%   BP/Weight 03/05/2017 01/25/2017 16/06/9603  Systolic BP 540 981 191  Diastolic BP 78 65 77  Wt. (Lbs) - 312 -  BMI - 50.36 -    Physical Exam  Skin:     Multiple hyperpigmented papules with stuck on appearance on face, arms and legs   Treated SKs with cryotherapy x 2-3 freeze-thaw cycles  Wart  Removal Procedure Note  Pre-operative Diagnosis: filiform wart  Post-operative Diagnosis: same as  above  Locations:anterior neck  Indications: cosmetic   Anesthesia: Lidocaine 1% with epinephrine without added sodium bicarbonate  Procedure Details  The risks (including bleeding and infection) and benefits of the procedure and Verbal informed consent obtained. Using sterile iris scissors, multiple skin tags were snipped off at their bases after cleansing with Betadine.  Bleeding was controlled by pressure.   Findings: Pathognomonic benign lesions  not sent for pathological exam.  Condition: Stable  Complications: none.  Plan: 1. Instructed to keep the wounds dry and covered for 24-48h and clean thereafter. 2. Warning signs of infection were reviewed.   3. Recommended that the patient use OTC analgesics as needed for pain.  4. Return as needed. Assessment & Plan:  [name] was seen today for skin problem.  Diagnoses and all orders for this visit:  Filiform wart  Osteopenia, unspecified location -     Calcium Citrate 200 MG TABS; Take 2 tablets (400 mg total) by mouth daily. -     Vitamin D, Ergocalciferol, (DRISDOL) 50000 units CAPS capsule; Take 1 capsule (50,000 Units total) by mouth every 30 (thirty) days.  Seborrheic keratoses  Vitamin D deficiency -     Vitamin D, 25-hydroxy   There are no diagnoses linked to this encounter.  No orders of the defined types were placed in this encounter.   Follow-up: Return in about 4 weeks (around 04/02/2017) for repeat cryotherapy.   Boykin Nearing MD

## 2017-03-05 NOTE — Patient Instructions (Addendum)
Diagnoses and all orders for this visit:  Filiform wart  Osteopenia, unspecified location -     Calcium Citrate 200 MG TABS; Take 2 tablets (400 mg total) by mouth daily. -     Vitamin D, Ergocalciferol, (DRISDOL) 50000 units CAPS capsule; Take 1 capsule (50,000 Units total) by mouth every 30 (thirty) days.  Seborrheic keratoses  Excision of Skin Lesions, Care After Refer to this sheet in the next few weeks. These instructions provide you with information about caring for yourself after your procedure. Your health care provider may also give you more specific instructions. Your treatment has been planned according to current medical practices, but problems sometimes occur. Call your health care provider if you have any problems or questions after your procedure. What can I expect after the procedure? After your procedure, it is common to have pain or discomfort at the excision site. Follow these instructions at home:  Take over-the-counter and prescription medicines only as told by your health care provider.  Follow instructions from your health care provider about: ? How to take care of your excision site. You should keep the site clean, dry, and protected for at least 48 hours. ? When and how you should change your bandage (dressing). ? When you should remove your dressing. ? Removing whatever was used to close your excision site.  Check the excision area every day for signs of infection. Watch for: ? Redness, swelling, or pain. ? Fluid, blood, or pus.  For bleeding, apply gentle but firm pressure to the area using a folded towel for 20 minutes.  Avoid high-impact exercise and activities until the stitches (sutures) are removed or the area heals.  Follow instructions from your health care provider about how to minimize scarring. Avoid sun exposure until the area has healed. Scarring should lessen over time.  Keep all follow-up visits as told by your health care provider. This is  important. Contact a health care provider if:  You have a fever.  You have redness, swelling, or pain at the excision site.  You have fluid, blood, or pus coming from the excision site.  You have ongoing bleeding at the excision site.  You have pain that does not improve in 2-3 days after your procedure.  You notice skin irregularities or changes in sensation. This information is not intended to replace advice given to you by your health care provider. Make sure you discuss any questions you have with your health care provider. Document Released: 01/08/2015 Document Revised: 01/30/2016 Document Reviewed: 10/10/2014 Elsevier Interactive Patient Education  2018 [employer].   F/u in 3-4 more weeks for repeat cryotherapy if needed   Dr. Adrian Blackwater   Dr. Adrian Blackwater

## 2017-03-06 LAB — VITAMIN D 25 HYDROXY (VIT D DEFICIENCY, FRACTURES): VIT D 25 HYDROXY: 18.2 ng/mL — AB (ref 30.0–100.0)

## 2017-03-10 MED ORDER — VITAMIN D (ERGOCALCIFEROL) 1.25 MG (50000 UNIT) PO CAPS: 50000.0000 [IU] | ORAL_CAPSULE | ORAL | 1 refills | Status: AC

## 2017-03-10 NOTE — Addendum Note (Signed)
Addended by: Boykin Nearing on: 03/10/2017 07:33 PM   Modules accepted: Orders

## 2017-03-25 ENCOUNTER — Other Ambulatory Visit: Payer: Self-pay | Admitting: Family Medicine

## 2017-03-25 DIAGNOSIS — I10 Essential (primary) hypertension: Secondary | ICD-10-CM

## 2017-03-25 DIAGNOSIS — F329 Major depressive disorder, single episode, unspecified: Secondary | ICD-10-CM

## 2017-03-25 DIAGNOSIS — F32A Depression, unspecified: Secondary | ICD-10-CM

## 2017-07-06 ENCOUNTER — Ambulatory Visit: Payer: Medicare Other

## 2017-08-18 ENCOUNTER — Other Ambulatory Visit: Payer: Self-pay | Admitting: Family Medicine

## 2017-08-19 ENCOUNTER — Ambulatory Visit: Payer: Medicare Other | Admitting: Family Medicine

## 2017-09-03 ENCOUNTER — Other Ambulatory Visit: Payer: Self-pay | Admitting: Family Medicine

## 2017-09-03 DIAGNOSIS — E039 Hypothyroidism, unspecified: Secondary | ICD-10-CM

## 2017-09-03 DIAGNOSIS — K529 Noninfective gastroenteritis and colitis, unspecified: Secondary | ICD-10-CM

## 2017-09-03 MED ORDER — DIPHENOXYLATE-ATROPINE 2.5-0.025 MG PO TABS: ORAL_TABLET | ORAL | 0 refills | Status: DC

## 2017-09-03 MED ORDER — LEVOTHYROXINE SODIUM 75 MCG PO TABS: 75.0000 ug | ORAL_TABLET | Freq: Every day | ORAL | 0 refills | Status: DC

## 2017-09-06 ENCOUNTER — Ambulatory Visit: Payer: Medicare Other | Attending: Family Medicine | Admitting: Family Medicine

## 2017-09-06 ENCOUNTER — Encounter: Payer: Self-pay | Admitting: Family Medicine

## 2017-09-06 ENCOUNTER — Encounter: Payer: Self-pay | Admitting: Licensed Clinical Social Worker

## 2017-09-06 VITALS — BP 96/68 | HR 82 | Temp 98.3°F | Resp 18 | Ht 66.0 in | Wt 326.8 lb

## 2017-09-06 DIAGNOSIS — M879 Osteonecrosis, unspecified: Secondary | ICD-10-CM | POA: Diagnosis not present

## 2017-09-06 DIAGNOSIS — F331 Major depressive disorder, recurrent, moderate: Secondary | ICD-10-CM | POA: Diagnosis not present

## 2017-09-06 DIAGNOSIS — G8929 Other chronic pain: Secondary | ICD-10-CM | POA: Diagnosis not present

## 2017-09-06 DIAGNOSIS — Z79899 Other long term (current) drug therapy: Secondary | ICD-10-CM | POA: Diagnosis not present

## 2017-09-06 DIAGNOSIS — Z79890 Hormone replacement therapy (postmenopausal): Secondary | ICD-10-CM | POA: Insufficient documentation

## 2017-09-06 DIAGNOSIS — K7460 Unspecified cirrhosis of liver: Secondary | ICD-10-CM | POA: Diagnosis not present

## 2017-09-06 DIAGNOSIS — E785 Hyperlipidemia, unspecified: Secondary | ICD-10-CM

## 2017-09-06 DIAGNOSIS — F329 Major depressive disorder, single episode, unspecified: Secondary | ICD-10-CM | POA: Diagnosis not present

## 2017-09-06 DIAGNOSIS — E039 Hypothyroidism, unspecified: Secondary | ICD-10-CM | POA: Diagnosis not present

## 2017-09-06 DIAGNOSIS — F32A Depression, unspecified: Secondary | ICD-10-CM

## 2017-09-06 DIAGNOSIS — B372 Candidiasis of skin and nail: Secondary | ICD-10-CM

## 2017-09-06 DIAGNOSIS — I10 Essential (primary) hypertension: Secondary | ICD-10-CM

## 2017-09-06 DIAGNOSIS — Z23 Encounter for immunization: Secondary | ICD-10-CM | POA: Diagnosis not present

## 2017-09-06 DIAGNOSIS — B1920 Unspecified viral hepatitis C without hepatic coma: Secondary | ICD-10-CM | POA: Insufficient documentation

## 2017-09-06 DIAGNOSIS — K529 Noninfective gastroenteritis and colitis, unspecified: Secondary | ICD-10-CM | POA: Diagnosis not present

## 2017-09-06 MED ORDER — KETOCONAZOLE 2 % EX CREA: 1.0000 "application " | TOPICAL_CREAM | Freq: Every day | CUTANEOUS | 0 refills | Status: DC

## 2017-09-06 MED ORDER — DIPHENOXYLATE-ATROPINE 2.5-0.025 MG PO TABS: ORAL_TABLET | ORAL | 2 refills | Status: DC

## 2017-09-06 MED ORDER — SERTRALINE HCL 50 MG PO TABS: 50.0000 mg | ORAL_TABLET | Freq: Every day | ORAL | 2 refills | Status: DC

## 2017-09-06 NOTE — Progress Notes (Signed)
Subjective:  Patient ID: [name], female    DOB: 08/26/1956  Age: 61 y.o. MRN: [id number]  CC: Establish Care   HPI [name] presents to establish care. PM includes hypothyroidism, avascular necrosis of the hip, chronic diarrhea, hep C, hepatitis cirrhosis, and depression. She denies any denies fatigue, weight changes, heat/cold intolerance, bowel/skin changes or CVS symptoms.  Previous thyroid studies include TSH. She c/o rash underneath bilateral breast. Rash is chronic and re-occurring. Associated symptoms include erythema and pruritis. She is using miconazole cream with minimal relief of symptoms. History of MDD. Onset 6 yrs ago. She denies any SI/HI. Factors include personal health. Reports decrease in quality of life since dx with avascular necrosis, chronic pain, and impaired mobility. Was seeing pain mgt 3 to 5 months ago but stopped. She is taking Zoloft. She is currently not receiving counseling/therapy. She is agreeable to speaking with LCSW today. History of hypertension. Denies any cardiac symptoms. She is adherent with anti-hypertensive use.      Outpatient Medications Prior to Visit  Medication Sig Dispense Refill  . amLODipine (NORVASC) 2.5 MG tablet Take 1 tablet (2.5 mg total) by mouth daily. 90 tablet 3  . diclofenac sodium (VOLTAREN) 1 % GEL Apply 2 g topically 4 (four) times daily as needed (pain). 2 Tube 2  . miconazole (MICOTIN) 2 % cream APPLY TOPICALLY AS DIRECTED TWICE A DAY 30 g 3  . ranitidine (ZANTAC) 300 MG capsule Take 1 capsule (300 mg total) by mouth at bedtime as needed for heartburn. Reported on 08/26/2015 30 capsule 11  . diphenoxylate-atropine (LOMOTIL) 2.5-0.025 MG tablet TAKE 1 TABLET BY MOUTH FOUR TIMES A DAY AS NEEDED FOR DIARRHEA OR LOOSE STOOLS 30 tablet 0  . lisinopril (PRINIVIL,ZESTRIL) 40 MG tablet TAKE 1 TABLET (40 MG TOTAL) BY MOUTH DAILY. 30 tablet 2  . pravastatin (PRAVACHOL) 40 MG tablet Take 1 tablet (40 mg total) by mouth daily.  90 tablet 3  . baclofen (LIORESAL) 10 MG tablet Take 0.5 tablets (5 mg total) by mouth 2 (two) times daily as needed for muscle spasms. (Patient not taking: Reported on 03/05/2017) 60 tablet 2  . Calcium Citrate 200 MG TABS Take 2 tablets (400 mg total) by mouth daily. (Patient not taking: Reported on 09/06/2017) 180 tablet 3  . omeprazole (PRILOSEC) 40 MG capsule Take 1 capsule (40 mg total) by mouth daily. (Patient not taking: Reported on 06/18/2016) 30 capsule 11  . levothyroxine (SYNTHROID, LEVOTHROID) 75 MCG tablet Take 1 tablet (75 mcg total) by mouth daily. (Patient not taking: Reported on 09/06/2017) 30 tablet 0  . sertraline (ZOLOFT) 50 MG tablet TAKE 1 TABLET (50 MG TOTAL) BY MOUTH DAILY. (Patient not taking: Reported on 09/06/2017) 30 tablet 2   No facility-administered medications prior to visit.     ROS Review of Systems  Constitutional: Negative.   Respiratory: Negative.   Cardiovascular: Negative.   Gastrointestinal: Negative.   Skin: Positive for rash.  Psychiatric/Behavioral: Positive for dysphoric mood. Negative for suicidal ideas.    Objective:  BP 96/68 (BP Location: Right Arm, Patient Position: Sitting, Cuff Size: Large)   Pulse 82   Temp 98.3 F (36.8 C) (Oral)   Resp 18   Ht 5\' 6"  (1.676 m)   Wt (!) 326 lb 12.8 oz (148.2 kg)   SpO2 95%   BMI 52.75 kg/m   BP/Weight 09/06/2017 03/05/2017 3/47/4259  Systolic BP 96 563 875  Diastolic BP 68 78 65  Wt. (Lbs) 326.8 - 312  BMI 52.75 - 50.36    Physical Exam  Constitutional: She appears well-developed and well-nourished.  Neck: No JVD present. No thyromegaly present.  Cardiovascular: Normal rate, regular rhythm, normal heart sounds and intact distal pulses.  Pulmonary/Chest: Effort normal and breath sounds normal.  Abdominal: Soft. Bowel sounds are normal. She exhibits no distension.  Skin: Skin is warm and dry.  Rash to bilateral intertriginous areas of breasts. Rash is erythremic.   Psychiatric: She  exhibits a depressed mood. She expresses no homicidal and no suicidal ideation. She expresses no suicidal plans and no homicidal plans. She is communicative. She is attentive.  Nursing note and vitals reviewed.  Depression screen [facility] 2/9 09/06/2017 03/05/2017 01/25/2017  Decreased Interest 1 1 2   Down, Depressed, Hopeless 1 0 1  PHQ - 2 Score 2 1 3   Altered sleeping 1 0 2  Tired, decreased energy 1 1 2   Change in appetite 1 1 2   Feeling bad or failure about yourself  0 0 0  Trouble concentrating 0 0 0  Moving slowly or fidgety/restless 0 0 0  Suicidal thoughts 0 0 0  PHQ-9 Score 5 3 9    GAD 7 : Generalized Anxiety Score 09/06/2017 03/05/2017 01/25/2017 06/18/2016  Nervous, Anxious, on Edge 1 0 1 0  Control/stop worrying 0 0 0 0  Worry too much - different things 0 0 0 0  Trouble relaxing 1 1 0 1  Restless 0 0 0 0  Easily annoyed or irritable 0 0 1 1  Afraid - awful might happen 0 0 1 0  Total GAD 7 Score 2 1 3 2       Assessment & Plan:   1. Intertriginous candidiasis  - ketoconazole (NIZORAL) 2 % cream; Apply 1 application topically daily. Apply to affected areas only. For 14 days. Do not use with miconazole cream.  Dispense: 15 g; Refill: 0  2. Depression  - sertraline (ZOLOFT) 50 MG tablet; Take 1 tablet (50 mg total) by mouth at bedtime.  Dispense: 30 tablet; Refill: 2  3. Essential hypertension  - CMP and Liver  4. Hypothyroidism, unspecified type  - TSH + free T4  5. Dyslipidemia  - Lipid Panel  6. Needs flu shot  - Flu Vaccine QUAD 6+ mos PF IM (Fluarix Quad PF)  7. Chronic diarrhea Mediation refill - diphenoxylate-atropine (LOMOTIL) 2.5-0.025 MG tablet; TAKE 1 TABLET BY MOUTH FOUR TIMES A DAY AS NEEDED FOR DIARRHEA OR LOOSE STOOLS  Dispense: 30 tablet; Refill: 2      Follow-up: Return in about 8 weeks (around 11/01/2017) for TSH/Depression.   Alfonse Spruce FNP

## 2017-09-06 NOTE — Progress Notes (Signed)
Patient is here to establish care. Patient stated she is having a yeast infection under her breast, patient stated the cream helps her but it don't get rid of it. Patient stated she is in pain everywhere and need a direction on what to do with her pain levels. Patient stated she needs medication refills.

## 2017-09-06 NOTE — Patient Instructions (Signed)
Skin Yeast Infection Skin yeast infection is a condition in which there is an overgrowth of yeast (candida) that normally lives on the skin. This condition usually occurs in areas of the skin that are constantly warm and moist, such as the armpits or the groin. What are the causes? This condition is caused by a change in the normal balance of the yeast and bacteria that live on the skin. What increases the risk? This condition is more likely to develop in:  People who are obese.  Pregnant women.  Women who take birth control pills.  People who have diabetes.  People who take antibiotic medicines.  People who take steroid medicines.  People who are malnourished.  People who have a weak defense (immune) system.  People who are 65 years of age or older.  What are the signs or symptoms? Symptoms of this condition include:  A red, swollen area of the skin.  Bumps on the skin.  Itchiness.  How is this diagnosed? This condition is diagnosed with a medical history and physical exam. Your health care provider may check for yeast by taking light scrapings of the skin to be viewed under a microscope. How is this treated? This condition is treated with medicine. Medicines may be prescribed or be available over-the-counter. The medicines may be:  Taken by mouth (orally).  Applied as a cream.  Follow these instructions at home:  Take or apply over-the-counter and prescription medicines only as told by your health care provider.  Eat more yogurt. This may help to keep your yeast infection from returning.  Maintain a healthy weight. If you need help losing weight, talk with your health care provider.  Keep your skin clean and dry.  If you have diabetes, keep your blood sugar under control. Contact a health care provider if:  Your symptoms go away and then return.  Your symptoms do not get better with treatment.  Your symptoms get worse.  Your rash spreads.  You have a  fever or chills.  You have new symptoms.  You have new warmth or redness of your skin. This information is not intended to replace advice given to you by your health care provider. Make sure you discuss any questions you have with your health care provider. Document Released: 05/12/2011 Document Revised: 04/19/2016 Document Reviewed: 02/25/2015 Elsevier Interactive Patient Education  2018 [employer] Inc[employer]  

## 2017-09-07 LAB — CMP AND LIVER
ALT: 11 IU/L (ref 0–32)
AST: 18 IU/L (ref 0–40)
Albumin: 4.2 g/dL (ref 3.6–4.8)
Alkaline Phosphatase: 63 IU/L (ref 39–117)
BUN: 12 mg/dL (ref 8–27)
Bilirubin Total: 0.3 mg/dL (ref 0.0–1.2)
Bilirubin, Direct: 0.08 mg/dL (ref 0.00–0.40)
CALCIUM: 9.2 mg/dL (ref 8.7–10.3)
CO2: 22 mmol/L (ref 20–29)
CREATININE: 0.62 mg/dL (ref 0.57–1.00)
Chloride: 104 mmol/L (ref 96–106)
GFR, EST AFRICAN AMERICAN: 112 mL/min/{1.73_m2} (ref >59)
GFR, EST NON AFRICAN AMERICAN: 97 mL/min/{1.73_m2} (ref >59)
Glucose: 96 mg/dL (ref 65–99)
Potassium: 4.4 mmol/L (ref 3.5–5.2)
SODIUM: 141 mmol/L (ref 134–144)
TOTAL PROTEIN: 7.9 g/dL (ref 6.0–8.5)

## 2017-09-07 LAB — TSH+FREE T4
FREE T4: 1.01 ng/dL (ref 0.82–1.77)
TSH: 4.82 u[IU]/mL — AB (ref 0.450–4.500)

## 2017-09-07 LAB — LIPID PANEL
CHOL/HDL RATIO: 3 ratio (ref 0.0–4.4)
Cholesterol, Total: 121 mg/dL (ref 100–199)
HDL: 40 mg/dL (ref >39)
LDL CALC: 58 mg/dL (ref 0–99)
Triglycerides: 113 mg/dL (ref 0–149)
VLDL CHOLESTEROL CAL: 23 mg/dL (ref 5–40)

## 2017-09-08 NOTE — BH Specialist Note (Signed)
Integrated Behavioral Health Initial Visit  MRN: [id number] Name: [name]  Number of [place] Clinician visits:: 1/6 Session Start time: 11:55 AM  Session End time: 12:30 PM Total time: 35 minutes  Type of Service: [place] Interpretor:No. Interpretor Name and Language: N/A   Warm Hand Off Completed.       SUBJECTIVE: [name] is a 62 y.o. female accompanied by self Patient was referred by FNP [place] for depression and anxiety. Patient reports the following symptoms/concerns: feelings of sadness and worry, difficulty sleeping, low energy Duration of problem: Ongoing; Severity of problem: moderate  OBJECTIVE: Mood: Depressed and Affect: Appropriate Risk of harm to self or others: No plan to harm self or others  LIFE CONTEXT: Family and Social: Pt resides with a friend. She has two adult sons School/Work: Pt receives disability and Medicare Self-Care: No report of substance use Life Changes: Pt has limited mobility due to ongoing medical conditions and experiences chronic pain  GOALS ADDRESSED: Patient will: 1. Reduce symptoms of: anxiety and depression 2. Increase knowledge and/or ability of: coping skills and self-management skills  3. Demonstrate ability to: Increase adequate support systems for patient/family  INTERVENTIONS: Interventions utilized: Supportive Counseling, Psychoeducation and/or Health Education and Link to [employer]  Standardized Assessments completed: GAD-7 and PHQ 2&9  ASSESSMENT: Patient currently experiencing anxiety and depression triggered by limited mobility and chronic pain due to ongoing chronic medical conditions. She reports feelings of sadness and worry, difficulty sleeping, and low energy. Pt has hx of receiving behavioral services with [facility] (discontinued med management approx 2 months ago)   Patient may benefit from psychoeducation, psychotherapy, and  medication management. [place] educated pt on correlation between one's physical and mental health. Pt was informed on therapeutic interventions to assist in managing chronic pain and decreasing symptoms of depression. LCSWA provided community resources to assist in making home more accessible, strengthening pt's support system, crisis intervention, and medication management.    PLAN: 1. Follow up with behavioral health clinician on : Pt was encouraged to contact LCSWA if symptoms worsen or fail to improve to schedule behavioral appointments at [facility]. 2. Behavioral recommendations: LCSWA recommends that pt apply healthy coping skills discussed and utilize provided resources. Pt is encouraged to schedule follow up appointment with LCSWA 3. Referral(s): Armed forces logistics/support/administrative officer (LME/Outside Clinic) and [employer]:  Housing 4. "From scale of 1-10, how likely are you to follow plan?": 8/10  [name], LCSW 09/09/16 4:30 PM

## 2017-09-09 ENCOUNTER — Other Ambulatory Visit: Payer: Self-pay | Admitting: Family Medicine

## 2017-09-09 ENCOUNTER — Telehealth: Payer: Self-pay | Admitting: *Deleted

## 2017-09-09 DIAGNOSIS — I10 Essential (primary) hypertension: Secondary | ICD-10-CM

## 2017-09-09 DIAGNOSIS — E039 Hypothyroidism, unspecified: Secondary | ICD-10-CM

## 2017-09-09 DIAGNOSIS — E785 Hyperlipidemia, unspecified: Secondary | ICD-10-CM

## 2017-09-09 MED ORDER — LEVOTHYROXINE SODIUM 75 MCG PO TABS: 75.0000 ug | ORAL_TABLET | Freq: Every day | ORAL | 1 refills | Status: DC

## 2017-09-09 MED ORDER — PRAVASTATIN SODIUM 40 MG PO TABS: 40.0000 mg | ORAL_TABLET | Freq: Every day | ORAL | 1 refills | Status: DC

## 2017-09-09 MED ORDER — LISINOPRIL 40 MG PO TABS: 40.0000 mg | ORAL_TABLET | Freq: Every day | ORAL | 2 refills | Status: DC

## 2017-09-09 NOTE — Telephone Encounter (Signed)
Patient verified DOB Patient is aware of thyroid not being controlled and medication being sent to [place]. Patient is aware of kidney, liver and cholesterol being normal and to continue with the pravastatin. Patient will return on 11/01/17 for recheck.

## 2017-09-09 NOTE — Telephone Encounter (Signed)
-----   Message from Alfonse Spruce, [place] sent at 09/09/2017  1:41 PM EST ----- Thyroid levels not well controlled. Start taking levothyroxine daily. Recommend follow up in 6 weeks. Kidney function normal Liver function normal Cholesterol levels normal. Continue pravastatin.

## 2017-10-13 ENCOUNTER — Other Ambulatory Visit: Payer: Self-pay | Admitting: Gastroenterology

## 2017-11-01 ENCOUNTER — Ambulatory Visit: Payer: Medicare Other | Admitting: Family Medicine

## 2017-12-23 ENCOUNTER — Ambulatory Visit (INDEPENDENT_AMBULATORY_CARE_PROVIDER_SITE_OTHER): Payer: Medicare Other | Admitting: Nurse Practitioner

## 2017-12-23 ENCOUNTER — Other Ambulatory Visit: Payer: Self-pay

## 2017-12-23 ENCOUNTER — Encounter (INDEPENDENT_AMBULATORY_CARE_PROVIDER_SITE_OTHER): Payer: Self-pay | Admitting: Nurse Practitioner

## 2017-12-23 VITALS — BP 133/83 | HR 88 | Temp 98.6°F | Ht 66.0 in | Wt 326.8 lb

## 2017-12-23 DIAGNOSIS — E039 Hypothyroidism, unspecified: Secondary | ICD-10-CM

## 2017-12-23 DIAGNOSIS — K529 Noninfective gastroenteritis and colitis, unspecified: Secondary | ICD-10-CM

## 2017-12-23 DIAGNOSIS — K219 Gastro-esophageal reflux disease without esophagitis: Secondary | ICD-10-CM

## 2017-12-23 DIAGNOSIS — M8700 Idiopathic aseptic necrosis of unspecified bone: Secondary | ICD-10-CM

## 2017-12-23 DIAGNOSIS — I739 Peripheral vascular disease, unspecified: Secondary | ICD-10-CM

## 2017-12-23 DIAGNOSIS — H8113 Benign paroxysmal vertigo, bilateral: Secondary | ICD-10-CM

## 2017-12-23 DIAGNOSIS — K7460 Unspecified cirrhosis of liver: Secondary | ICD-10-CM | POA: Diagnosis not present

## 2017-12-23 DIAGNOSIS — I10 Essential (primary) hypertension: Secondary | ICD-10-CM | POA: Diagnosis not present

## 2017-12-23 DIAGNOSIS — D696 Thrombocytopenia, unspecified: Secondary | ICD-10-CM

## 2017-12-23 MED ORDER — MECLIZINE HCL 12.5 MG PO TABS: 12.5000 mg | ORAL_TABLET | Freq: Three times a day (TID) | ORAL | 0 refills | Status: DC | PRN

## 2017-12-23 MED ORDER — OMEPRAZOLE 40 MG PO CPDR: 40.0000 mg | DELAYED_RELEASE_CAPSULE | Freq: Every day | ORAL | 3 refills | Status: DC

## 2017-12-23 MED ORDER — LISINOPRIL 40 MG PO TABS: 40.0000 mg | ORAL_TABLET | Freq: Every day | ORAL | 2 refills | Status: DC

## 2017-12-23 MED ORDER — AMLODIPINE BESYLATE 2.5 MG PO TABS: 2.5000 mg | ORAL_TABLET | Freq: Every day | ORAL | 3 refills | Status: DC

## 2017-12-23 MED ORDER — DIPHENOXYLATE-ATROPINE 2.5-0.025 MG PO TABS: ORAL_TABLET | ORAL | 2 refills | Status: DC

## 2017-12-23 NOTE — Progress Notes (Signed)
Assessment & Plan:  [name] was seen today for restablish care and medication refill.  Diagnoses and all orders for this visit:  Hypothyroidism, unspecified type -     Thyroid Panel With TSH  AVN (avascular necrosis of bone) ([place]) She has chronic right hip pain and low back pain. She uses wheelchair for mobility. No longer sees Pain management.  History of osteopenia.  Hepatic cirrhosis, unspecified hepatic cirrhosis type, unspecified whether ascites present ([place]) Resolved.   Morbid obesity ([place]) Discussed diet and exercise for person with BMI >25. Instructed: You must burn more calories than you eat. Losing 5 percent of your body weight should be considered a success. In the longer term, losing more than 15 percent of your body weight and staying at this weight is an extremely good result. However, keep in mind that even losing 5 percent of your body weight leads to important health benefits, so try not to get discouraged if you're not able to lose more than this. Will recheck weight in 3-6 months. Peripheral vascular disease, unspecified ([place])  Thrombocytopenia ([place])  Essential hypertension, benign -     amLODipine (NORVASC) 2.5 MG tablet; Take 1 tablet (2.5 mg total) by mouth daily. -     lisinopril (PRINIVIL,ZESTRIL) 40 MG tablet; Take 1 tablet (40 mg total) by mouth daily. Continue all antihypertensives as prescribed.  Remember to bring in your blood pressure log with you for your follow up appointment.  DASH/Mediterranean Diets are healthier choices for HTN.    Chronic diarrhea -     diphenoxylate-atropine (LOMOTIL) 2.5-0.025 MG tablet; TAKE 1 TABLET BY MOUTH FOUR TIMES A DAY AS NEEDED FOR DIARRHEA OR LOOSE STOOLS  Benign paroxysmal positional vertigo due to bilateral vestibular disorder -     meclizine (ANTIVERT) 12.5 MG tablet; Take 1 tablet (12.5 mg total) by mouth 3 (three) times daily as needed for dizziness.  Gastroesophageal reflux disease, esophagitis presence not  specified -     omeprazole (PRILOSEC) 40 MG capsule; Take 1 capsule (40 mg total) by mouth daily. INSTRUCTIONS: Avoid GERD Triggers: acidic, spicy or fried foods, caffeine, coffee, sodas,  alcohol and chocolate.     Patient has been counseled on age-appropriate routine health concerns for screening and prevention. These are reviewed and up-to-date. Referrals have been placed accordingly. Immunizations are up-to-date or declined.    Subjective:   Chief Complaint  Patient presents with  . restablish care    tsh  . Medication Refill   HPI [name] 62 y.o. female presents to office today to re establish care and to follow up for depression.  Hypothyroidism She endorses constant dizziness. She has taken her blood pressure when the dizziness occurs and notes normal readings.  She has not taken her thyroid medication in over one month. Reports she ran out and could not obtain a refill.    Depression She was previously taking zoloft and stopped taking it 3 months ago. She declines restarting at this time. Denies suicidal ideation. Depression screen PHQ 2/9 12/23/2017  Decreased Interest 2  Down, Depressed, Hopeless 1  PHQ - 2 Score 3  Altered sleeping 1  Tired, decreased energy 2  Change in appetite 1  Feeling bad or failure about yourself  1  Trouble concentrating 0  Moving slowly or fidgety/restless 0  Suicidal thoughts 0  PHQ-9 Score 8  Some recent data might be hidden    Essential Hypertension Chronic. Stable. She is not diet or exercise compliant but endorses medication compliance. Denies  chest pain, shortness of breath, palpitations,  headaches or new onset visual disturbances.   BP Readings from Last 3 Encounters:  12/23/17 133/83  09/06/17 96/68  03/05/17 111/78    GERD Chronic. Well controlled with omeprazole.   Review of Systems  Constitutional: Negative for fever, malaise/fatigue and weight loss.  HENT: Negative.  Negative for nosebleeds.   Eyes: Negative.   Negative for blurred vision, double vision and photophobia.  Respiratory: Negative.  Negative for cough and shortness of breath.   Cardiovascular: Negative.  Negative for chest pain, palpitations and leg swelling.  Gastrointestinal: Positive for heartburn. Negative for nausea and vomiting.  Musculoskeletal: Positive for back pain, joint pain and myalgias.  Neurological: Positive for dizziness and focal weakness. Negative for seizures and headaches.  Psychiatric/Behavioral: Positive for depression. Negative for suicidal ideas.    Past Medical History:  Diagnosis Date  . Anemia   . Arthritis   . AVN (avascular necrosis of bone) ([place]) 04/14/2013  . Chronic kidney disease   . Complication of anesthesia   . Decreased mobility    hx. Rt. hip "avascular necrosis" -unable to weight bear long periods, "using wheelchair"..  . Depression   . GERD (gastroesophageal reflux disease)   . Hep B w/o coma   . Hep C w/o coma, chronic ([place])    07-20-14 being presently tx. "Harvoni"-good response per pt. will complete in 7days- Dr. Linus Salmons follows [facility].  . Hypertension   . Hypothyroid    mild- no medication  . Knee stiffness    right knee -hard to bend at knee  . Muscle cramps   . Obesity   . Osteoporosis   . Pneumonia   . PONV (postoperative nausea and vomiting)    only with Demerol  . Shortness of breath dyspnea    still smokes  . Superficial thrombophlebitis    left leg  . Transfusion history    70's, 80's ? hepatitis C attributed to past transfusions    Past Surgical History:  Procedure Laterality Date  . ABDOMINAL HYSTERECTOMY    . APPENDECTOMY     thinks it was removed with hysterectomy  . arthroscopies     knee-right x 7  . CHOLECYSTECTOMY    . COLON SURGERY    . COLONOSCOPY    . COLONOSCOPY WITH PROPOFOL N/A 02/26/2015   Procedure: COLONOSCOPY WITH PROPOFOL;  Surgeon: Inda Castle, MD;  Location: [facility];  Service: Endoscopy;  Laterality:  N/A;  . COLONOSCOPY WITH PROPOFOL N/A 03/03/2016   Procedure: COLONOSCOPY WITH PROPOFOL;  Surgeon: Mauri Pole, MD;  Location: [facility];  Service: Endoscopy;  Laterality: N/A;  . ESOPHAGOGASTRODUODENOSCOPY (EGD) WITH PROPOFOL N/A 03/03/2016   Procedure: ESOPHAGOGASTRODUODENOSCOPY (EGD) WITH PROPOFOL;  Surgeon: Mauri Pole, MD;  Location: [facility];  Service: Endoscopy;  Laterality: N/A;  . JOINT REPLACEMENT     right knee  . TOTAL KNEE ARTHROPLASTY      Family History  Problem Relation Age of Onset  . Cancer Mother   . Diabetes Mother   . Heart disease Mother   . Hypertension Mother   . Cancer Father   . Hypertension Sister   . Hypertension Brother   . Diabetes Brother   . Colon cancer Neg Hx     Social History Reviewed with no changes to be made today.   Outpatient Medications Prior to Visit  Medication Sig Dispense Refill  . Calcium Citrate 200 MG TABS Take 2 tablets (400 mg total) by  mouth daily. 180 tablet 3  . diclofenac sodium (VOLTAREN) 1 % GEL Apply 2 g topically 4 (four) times daily as needed (pain). 2 Tube 2  . miconazole (MICOTIN) 2 % cream APPLY TOPICALLY AS DIRECTED TWICE A DAY 30 g 3  . pravastatin (PRAVACHOL) 40 MG tablet Take 1 tablet (40 mg total) by mouth daily. 90 tablet 1  . ranitidine (ZANTAC) 300 MG tablet TAKE 1 TABLET BY MOUTH AT BEDTIME AS NEEDED FOR HEARTBURN 30 tablet 3  . sertraline (ZOLOFT) 50 MG tablet Take 1 tablet (50 mg total) by mouth at bedtime. 30 tablet 2  . amLODipine (NORVASC) 2.5 MG tablet Take 1 tablet (2.5 mg total) by mouth daily. 90 tablet 3  . lisinopril (PRINIVIL,ZESTRIL) 40 MG tablet Take 1 tablet (40 mg total) by mouth daily. 30 tablet 2  . levothyroxine (SYNTHROID, LEVOTHROID) 75 MCG tablet Take 1 tablet (75 mcg total) by mouth daily. (Patient not taking: Reported on 12/23/2017) 30 tablet 1  . baclofen (LIORESAL) 10 MG tablet Take 0.5 tablets (5 mg total) by mouth 2 (two) times daily as needed for muscle spasms.  (Patient not taking: Reported on 03/05/2017) 60 tablet 2  . diphenoxylate-atropine (LOMOTIL) 2.5-0.025 MG tablet TAKE 1 TABLET BY MOUTH FOUR TIMES A DAY AS NEEDED FOR DIARRHEA OR LOOSE STOOLS 30 tablet 2  . ketoconazole (NIZORAL) 2 % cream Apply 1 application topically daily. Apply to affected areas only. For 14 days. Do not use with miconazole cream. 15 g 0  . omeprazole (PRILOSEC) 40 MG capsule Take 1 capsule (40 mg total) by mouth daily. (Patient not taking: Reported on 06/18/2016) 30 capsule 11   No facility-administered medications prior to visit.     Allergies  Allergen Reactions  . Penicillins Anaphylaxis  . Demerol Nausea And Vomiting  . Meperidine Nausea And Vomiting       Objective:    BP 133/83 (BP Location: Right Arm, Patient Position: Sitting, Cuff Size: Large)   Pulse 88   Temp 98.6 F (37 C) (Oral)   Ht 5\' 6"  (1.676 m)   Wt (!) 326 lb 12.8 oz (148.2 kg)   SpO2 95%   BMI 52.75 kg/m  Wt Readings from Last 3 Encounters:  12/23/17 (!) 326 lb 12.8 oz (148.2 kg)  09/06/17 (!) 326 lb 12.8 oz (148.2 kg)  01/25/17 (!) 312 lb (141.5 kg)    Physical Exam  Constitutional: She is oriented to person, place, and time. She appears well-developed and well-nourished. She is cooperative.  Sitting in wheelchair  HENT:  Head: Normocephalic and atraumatic.  Eyes: EOM are normal.  Neck: Normal range of motion.  Cardiovascular: Normal rate, regular rhythm and normal heart sounds. Exam reveals no gallop and no friction rub.  No murmur heard. Pulmonary/Chest: Effort normal and breath sounds normal. No tachypnea. No respiratory distress. She has no decreased breath sounds. She has no wheezes. She has no rhonchi. She has no rales. She exhibits no tenderness.  Abdominal: Bowel sounds are normal.  Musculoskeletal: She exhibits no edema.  Neurological: She is alert and oriented to person, place, and time. Gait abnormal. Coordination normal.  Skin: Skin is warm and dry.  Psychiatric: She  has a normal mood and affect. Her behavior is normal. Judgment and thought content normal.  Nursing note and vitals reviewed.      Patient has been counseled extensively about nutrition and exercise as well as the importance of adherence with medications and regular follow-up. The patient was given clear instructions to  go to ER or return to medical center if symptoms don't improve, worsen or new problems develop. The patient verbalized understanding.   Follow-up: Return in about 3 months (around 03/24/2018) for thyroid, vertigo.   Gildardo Pounds, FNP-BC [facility] and [facility], [place]   12/28/2017, 8:49 AM

## 2017-12-23 NOTE — Patient Instructions (Signed)
Benign Positional Vertigo Vertigo is the feeling that you or your surroundings are moving when they are not. Benign positional vertigo is the most common form of vertigo. The cause of this condition is not serious (is benign). This condition is triggered by certain movements and positions (is positional). This condition can be dangerous if it occurs while you are doing something that could endanger you or others, such as driving. What are the causes? In many cases, the cause of this condition is not known. It may be caused by a disturbance in an area of the inner ear that helps your brain to sense movement and balance. This disturbance can be caused by a viral infection (labyrinthitis), head injury, or repetitive motion. What increases the risk? This condition is more likely to develop in:  Women.  People who are 50 years of age or older.  What are the signs or symptoms? Symptoms of this condition usually happen when you move your head or your eyes in different directions. Symptoms may start suddenly, and they usually last for less than a minute. Symptoms may include:  Loss of balance and falling.  Feeling like you are spinning or moving.  Feeling like your surroundings are spinning or moving.  Nausea and vomiting.  Blurred vision.  Dizziness.  Involuntary eye movement (nystagmus).  Symptoms can be mild and cause only slight annoyance, or they can be severe and interfere with daily life. Episodes of benign positional vertigo may return (recur) over time, and they may be triggered by certain movements. Symptoms may improve over time. How is this diagnosed? This condition is usually diagnosed by medical history and a physical exam of the head, neck, and ears. You may be referred to a health care provider who specializes in ear, nose, and throat (ENT) problems (otolaryngologist) or a provider who specializes in disorders of the nervous system (neurologist). You may have additional testing,  including:  MRI.  A CT scan.  Eye movement tests. Your health care provider may ask you to change positions quickly while he or she watches you for symptoms of benign positional vertigo, such as nystagmus. Eye movement may be tested with an electronystagmogram (ENG), caloric stimulation, the Dix-Hallpike test, or the roll test.  An electroencephalogram (EEG). This records electrical activity in your brain.  Hearing tests.  How is this treated? Usually, your health care provider will treat this by moving your head in specific positions to adjust your inner ear back to normal. Surgery may be needed in severe cases, but this is rare. In some cases, benign positional vertigo may resolve on its own in 2-4 weeks. Follow these instructions at home: Safety  Move slowly.Avoid sudden body or head movements.  Avoid driving.  Avoid operating heavy machinery.  Avoid doing any tasks that would be dangerous to you or others if a vertigo episode would occur.  If you have trouble walking or keeping your balance, try using a cane for stability. If you feel dizzy or unstable, sit down right away.  Return to your normal activities as told by your health care provider. Ask your health care provider what activities are safe for you. General instructions  Take over-the-counter and prescription medicines only as told by your health care provider.  Avoid certain positions or movements as told by your health care provider.  Drink enough fluid to keep your urine clear or pale yellow.  Keep all follow-up visits as told by your health care provider. This is important. Contact a health care   provider if:  You have a fever.  Your condition gets worse or you develop new symptoms.  Your family or friends notice any behavioral changes.  Your nausea or vomiting gets worse.  You have numbness or a "pins and needles" sensation. Get help right away if:  You have difficulty speaking or moving.  You are  always dizzy.  You faint.  You develop severe headaches.  You have weakness in your legs or arms.  You have changes in your hearing or vision.  You develop a stiff neck.  You develop sensitivity to light. This information is not intended to replace advice given to you by your health care provider. Make sure you discuss any questions you have with your health care provider. Document Released: 06/01/2006 Document Revised: 01/30/2016 Document Reviewed: 12/17/2014 Elsevier Interactive Patient Education  2018 [employer]. Dizziness Dizziness is a common problem. It makes you feel unsteady or light-headed. You may feel like you are about to pass out (faint). Dizziness can lead to getting hurt if you stumble or fall. Dizziness can be caused by many things, including:  Medicines.  Not having enough water in your body (dehydration).  Illness.  Follow these instructions at home: Eating and drinking  Drink enough fluid to keep your pee (urine) clear or pale yellow. This helps to keep you from getting dehydrated. Try to drink more clear fluids, such as water.  Do not drink alcohol.  Limit how much caffeine you drink or eat, if your doctor tells you to do that.  Limit how much salt (sodium) you drink or eat, if your doctor tells you to do that. Activity  Avoid making quick movements. ? When you stand up from sitting in a chair, steady yourself until you feel okay. ? In the morning, first sit up on the side of the bed. When you feel okay, stand slowly while you hold onto something. Do this until you know that your balance is fine.  If you need to stand in one place for a long time, move your legs often. Tighten and relax the muscles in your legs while you are standing.  Do not drive or use heavy machinery if you feel dizzy.  Avoid bending down if you feel dizzy. Place items in your home so you can reach them easily without leaning over. Lifestyle  Do not use any products that  contain nicotine or tobacco, such as cigarettes and e-cigarettes. If you need help quitting, ask your doctor.  Try to lower your stress level. You can do this by using methods such as yoga or meditation. Talk with your doctor if you need help. General instructions  Watch your dizziness for any changes.  Take over-the-counter and prescription medicines only as told by your doctor. Talk with your doctor if you think that you are dizzy because of a medicine that you are taking.  Tell a friend or a family member that you are feeling dizzy. If he or she notices any changes in your behavior, have this person call your doctor.  Keep all follow-up visits as told by your doctor. This is important. Contact a doctor if:  Your dizziness does not go away.  Your dizziness or light-headedness gets worse.  You feel sick to your stomach (nauseous).  You have trouble hearing.  You have new symptoms.  You are unsteady on your feet.  You feel like the room is spinning. Get help right away if:  You throw up (vomit) or have watery poop (diarrhea),  and you cannot eat or drink anything.  You have trouble: ? Talking. ? Walking. ? Swallowing. ? Using your arms, hands, or legs.  You feel generally weak.  You are not thinking clearly, or you have trouble forming sentences. A friend or family member may notice this.  You have: ? Chest pain. ? Pain in your belly (abdomen). ? Shortness of breath. ? Sweating.  Your vision changes.  You are bleeding.  You have a very bad headache.  You have neck pain or a stiff neck.  You have a fever. These symptoms may be an emergency. Do not wait to see if the symptoms will go away. Get medical help right away. Call your local emergency services (911 in the [place] Do not drive yourself to the hospital. Summary  Dizziness makes you feel unsteady or light-headed. You may feel like you are about to pass out (faint).  Drink enough fluid to keep your pee  (urine) clear or pale yellow. Do not drink alcohol.  Avoid making quick movements if you feel dizzy.  Watch your dizziness for any changes. This information is not intended to replace advice given to you by your health care provider. Make sure you discuss any questions you have with your health care provider. Document Released: 08/13/2011 Document Revised: 09/10/2016 Document Reviewed: 09/10/2016 Elsevier Interactive Patient Education  2017 [employer]  Meniere Disease Meniere disease is an inner ear disorder. It causes attacks of a spinning sensation (vertigo), dizziness, and ringing in the ear (tinnitus). It also causes hearing loss and a feeling of fullness or pressure in the ear. This is a lifelong condition, and it may get worse over time. You may have drop attacks or severe dizziness that makes you fall. A drop attack is when you suddenly fall without losing consciousness and you quickly recover after a few seconds or minutes. What are the causes? This condition is caused by having too much of the fluid that is in your inner ear (endolymph). When fluid builds up in your inner ear, it affects the nerves that control balance and hearing. The reason for the fluid buildup is not known. Possible causes include:  Allergies.  An abnormal reaction of the body's defense system (autoimmune disease).  Viral infection of the inner ear.  Head injury.  What increases the risk? You are more likely to develop this condition if:  You are older than age 18.  You have a family history of Meniere disease.  You have a history of autoimmune disease.  You have a history of migraine headaches.  What are the signs or symptoms? Symptoms of this condition can come and go and may last for up to 4 hours at a time. Symptoms usually start in one ear. They may become more frequent and eventually involve both ears. Symptoms can include:  Fullness and pressure in your ear.  Roaring or ringing in your  ear.  Vertigo and loss of balance.  Dizziness.  Decreased hearing.  Nausea and vomiting.  How is this diagnosed? This condition is diagnosed based on:  A physical exam.  Tests , such as: ? A hearing test (audiogram). ? An electronystagmogram. This tests your balance nerve (vestibular nerve). ? Imaging studies of your inner ear, such as CT scan or MRI. ? Other balance tests, such as rotational or balance platform tests.  How is this treated? There is no cure for this condition, but treatment can help to manage your symptoms. Treatment may include:  A low-salt diet.  Limiting salt may help to reduce fluid in the body and relieve symptoms.  Oral or injected medicines to reduce or control: ? Vertigo. ? Nausea. ? Fluid retention. ? Dizziness.  Use of an air pressure pulse generator. This is a machine that sends small pressure pulses into your ear canal.  Hearing aids.  Inner ear surgery. This is rare.  When you have symptoms, it can be helpful to lie down on a flat surface and focus your eyes on one object that does not move. Try to stay in that position until your symptoms go away. Follow these instructions at home: Eating and drinking  Eat the same amount of food at the same time every day, including snacks.  Do not skip meals.  Avoid caffeine.  Drink enough fluids to keep your urine clear or pale yellow.  Limit alcoholic drinks to one drink a day for non-pregnant women and 2 drinks a day for men. One drink equals 12 oz of beer, 5 oz of wine, or 1 oz of hard liquor.  Limit the salt (sodium) in your diet as told by your health care provider. Check ingredients and nutrition facts on packaged foods and beverages.  Do not eat foods that contain monosodium glutamate (MSG). General instructions  Do not use any products that contain nicotine or tobacco, such as cigarettes and e-cigarettes. If you need help quitting, ask your health care provider.  Take  over-the-counter and prescription medicines only as told by your health care provider.  Find ways to reduce or avoid stress. If you need help with this, ask your health care provider.  Do not drive if you have vertigo or dizziness. Contact a health care provider if:  You have symptoms that last longer than 4 hours.  You have new or worse symptoms. Get help right away if:  You have been vomiting for 24 hours.  You cannot keep fluids down.  You have chest pain or trouble breathing. Summary  Meniere disease is an inner ear disorder. It causes attacks of a spinning sensation (vertigo), dizziness, and ringing in the ear (tinnitus). It also causes hearing loss and a feeling of fullness or pressure in the ear.  Symptoms of this condition can come and go and may last for up to 4 hours at a time.  When you have symptoms, it can be helpful to lie down on a flat surface and focus your eyes on one object that does not move. Try to stay in that position until your symptoms go away. This information is not intended to replace advice given to you by your health care provider. Make sure you discuss any questions you have with your health care provider. Document Released: 08/21/2000 Document Revised: 07/15/2016 Document Reviewed: 07/15/2016 Elsevier Interactive Patient Education  2017 [employer].

## 2017-12-24 LAB — THYROID PANEL WITH TSH
FREE THYROXINE INDEX: 1.7 (ref 1.2–4.9)
T3 Uptake Ratio: 20 % — ABNORMAL LOW (ref 24–39)
T4, Total: 8.6 ug/dL (ref 4.5–12.0)
TSH: 5.24 u[IU]/mL — ABNORMAL HIGH (ref 0.450–4.500)

## 2017-12-28 ENCOUNTER — Encounter (INDEPENDENT_AMBULATORY_CARE_PROVIDER_SITE_OTHER): Payer: Self-pay | Admitting: Nurse Practitioner

## 2017-12-28 MED ORDER — LEVOTHYROXINE SODIUM 75 MCG PO TABS: 75.0000 ug | ORAL_TABLET | Freq: Every day | ORAL | 2 refills | Status: DC

## 2018-03-24 ENCOUNTER — Ambulatory Visit (INDEPENDENT_AMBULATORY_CARE_PROVIDER_SITE_OTHER): Payer: Medicare Other | Admitting: Physician Assistant

## 2018-03-24 ENCOUNTER — Encounter (INDEPENDENT_AMBULATORY_CARE_PROVIDER_SITE_OTHER): Payer: Self-pay | Admitting: Physician Assistant

## 2018-03-24 ENCOUNTER — Other Ambulatory Visit: Payer: Self-pay

## 2018-03-24 VITALS — BP 123/77 | HR 106 | Temp 98.3°F | Ht 66.0 in

## 2018-03-24 DIAGNOSIS — Z131 Encounter for screening for diabetes mellitus: Secondary | ICD-10-CM

## 2018-03-24 DIAGNOSIS — R7303 Prediabetes: Secondary | ICD-10-CM | POA: Diagnosis not present

## 2018-03-24 DIAGNOSIS — F418 Other specified anxiety disorders: Secondary | ICD-10-CM | POA: Diagnosis not present

## 2018-03-24 DIAGNOSIS — E039 Hypothyroidism, unspecified: Secondary | ICD-10-CM | POA: Diagnosis not present

## 2018-03-24 DIAGNOSIS — M8700 Idiopathic aseptic necrosis of unspecified bone: Secondary | ICD-10-CM | POA: Diagnosis not present

## 2018-03-24 LAB — POCT GLYCOSYLATED HEMOGLOBIN (HGB A1C): HEMOGLOBIN A1C: 5.6 % (ref 4.0–5.6)

## 2018-03-24 NOTE — Patient Instructions (Signed)
Hypothyroidism Hypothyroidism is a disorder of the thyroid. The thyroid is a large gland that is located in the lower front of the neck. The thyroid releases hormones that control how the body works. With hypothyroidism, the thyroid does not make enough of these hormones. What are the causes? Causes of hypothyroidism may include:  Viral infections.  Pregnancy.  Your own defense system (immune system) attacking your thyroid.  Certain medicines.  Birth defects.  Past radiation treatments to your head or neck.  Past treatment with radioactive iodine.  Past surgical removal of part or all of your thyroid.  Problems with the gland that is located in the center of your brain (pituitary).  What are the signs or symptoms? Signs and symptoms of hypothyroidism may include:  Feeling as though you have no energy (lethargy).  Inability to tolerate cold.  Weight gain that is not explained by a change in diet or exercise habits.  Dry skin.  Coarse hair.  Menstrual irregularity.  Slowing of thought processes.  Constipation.  Sadness or depression.  How is this diagnosed? Your health care provider may diagnose hypothyroidism with blood tests and ultrasound tests. How is this treated? Hypothyroidism is treated with medicine that replaces the hormones that your body does not make. After you begin treatment, it may take several weeks for symptoms to go away. Follow these instructions at home:  Take medicines only as directed by your health care provider.  If you start taking any new medicines, tell your health care provider.  Keep all follow-up visits as directed by your health care provider. This is important. As your condition improves, your dosage needs may change. You will need to have blood tests regularly so that your health care provider can watch your condition. Contact a health care provider if:  Your symptoms do not get better with treatment.  You are taking thyroid  replacement medicine and: ? You sweat excessively. ? You have tremors. ? You feel anxious. ? You lose weight rapidly. ? You cannot tolerate heat. ? You have emotional swings. ? You have diarrhea. ? You feel weak. Get help right away if:  You develop chest pain.  You develop an irregular heartbeat.  You develop a rapid heartbeat. This information is not intended to replace advice given to you by your health care provider. Make sure you discuss any questions you have with your health care provider. Document Released: 08/24/2005 Document Revised: 01/30/2016 Document Reviewed: 01/09/2014 Elsevier Interactive Patient Education  2018 Elsevier Inc.  

## 2018-03-24 NOTE — Progress Notes (Signed)
Subjective:  Patient ID: [name], female    DOB: [birth year]  Age: 62 y.o. MRN: [id number]  CC: f/u thyroid  HPI [name] is a 62 y.o. female with a medical history of anemia, AVN, CKD, depression, HBV, HCV, hepatic cirrhosis, GERD, HTN, PVD, hypothyroidism, prediabetes, morbid obesity, and superficial thrombophlebitis presents as a new patient with need for referral to a psychiatrist other than [facility]. Takes half tab of Zoloft 50 mg which has been somewhat helpful. Attributes depression to chronic pain of right hip secondary to avascular necrosis. She is unable to perform activities or work. ADLs have become difficult to perform since AVN began. PHQ9 7 and GAD7 7 today.     Pt complains of chronic diarrhea with liquid and foods since one and a half years ago. Was seen by GI and had multiple colonic polyps removed which worsened her diarrhea. Colonoscopy and EGD was performed again six months later with removal of multiple polyps. Says GI never found the cause of her diarrhea. Sister has similar symptoms and is currently undergoing extensive work up. Pt wants to wait for sister's results before she proceeds with further work up for herself.    Outpatient Medications Prior to Visit  Medication Sig Dispense Refill  . amLODipine (NORVASC) 2.5 MG tablet Take 1 tablet (2.5 mg total) by mouth daily. 90 tablet 3  . diclofenac sodium (VOLTAREN) 1 % GEL Apply 2 g topically 4 (four) times daily as needed (pain). 2 Tube 2  . diphenoxylate-atropine (LOMOTIL) 2.5-0.025 MG tablet TAKE 1 TABLET BY MOUTH FOUR TIMES A DAY AS NEEDED FOR DIARRHEA OR LOOSE STOOLS 30 tablet 2  . levothyroxine (SYNTHROID, LEVOTHROID) 75 MCG tablet Take 1 tablet (75 mcg total) by mouth daily. 30 tablet 2  . lisinopril (PRINIVIL,ZESTRIL) 40 MG tablet Take 1 tablet (40 mg total) by mouth daily. 30 tablet 2  . meclizine (ANTIVERT) 12.5 MG tablet Take 1 tablet (12.5 mg total) by mouth 3 (three) times daily as needed for  dizziness. 60 tablet 0  . miconazole (MICOTIN) 2 % cream APPLY TOPICALLY AS DIRECTED TWICE A DAY 30 g 3  . omeprazole (PRILOSEC) 40 MG capsule Take 1 capsule (40 mg total) by mouth daily. 90 capsule 3  . pravastatin (PRAVACHOL) 40 MG tablet Take 1 tablet (40 mg total) by mouth daily. 90 tablet 1  . ranitidine (ZANTAC) 300 MG tablet TAKE 1 TABLET BY MOUTH AT BEDTIME AS NEEDED FOR HEARTBURN 30 tablet 3  . sertraline (ZOLOFT) 50 MG tablet Take 1 tablet (50 mg total) by mouth at bedtime. 30 tablet 2  . Calcium Citrate 200 MG TABS Take 2 tablets (400 mg total) by mouth daily. (Patient not taking: Reported on 03/24/2018) 180 tablet 3   No facility-administered medications prior to visit.      ROS Review of Systems  Constitutional: Negative for chills, fever and malaise/fatigue.  Eyes: Negative for blurred vision.  Respiratory: Negative for shortness of breath.   Cardiovascular: Negative for chest pain and palpitations.  Gastrointestinal: Negative for abdominal pain and nausea.  Genitourinary: Negative for dysuria and hematuria.  Musculoskeletal: Positive for joint pain. Negative for myalgias.  Skin: Negative for rash.  Neurological: Negative for tingling and headaches.  Psychiatric/Behavioral: Positive for depression. The patient is nervous/anxious.     Objective:  BP 123/77 (BP Location: Left Arm, Patient Position: Sitting, Cuff Size: Large)   Pulse (!) 106   Temp 98.3 F (36.8 C) (Oral)   Ht 5\' 6"  (1.676 m)  SpO2 94%   BMI 52.75 kg/m   BP/Weight 03/24/2018 12/23/2017 80/99/8338  Systolic BP 250 539 96  Diastolic BP 77 83 68  Wt. (Lbs) - 326.8 326.8  BMI 52.75 52.75 52.75      Physical Exam  Constitutional: She is oriented to person, place, and time.  Well developed, morbidly obese, sitting in powered wheelchair, NAD, polite  HENT:  Head: Normocephalic and atraumatic.  Eyes: No scleral icterus.  Neck: Normal range of motion. Neck supple. No thyromegaly present.   Cardiovascular: Normal rate, regular rhythm and normal heart sounds.  Pulmonary/Chest: Effort normal and breath sounds normal.  Abdominal: Soft. Bowel sounds are normal. There is no tenderness.  Musculoskeletal: She exhibits no edema.  Right knee with limited aROM 2/2 pain  Neurological: She is alert and oriented to person, place, and time.  Skin: Skin is warm and dry. No rash noted. No erythema. No pallor.  Psychiatric: She has a normal mood and affect. Her behavior is normal. Thought content normal.  Vitals reviewed.    Assessment & Plan:   1. Depression with anxiety - Ambulatory referral to Psychiatry  2. Hypothyroidism, unspecified type - Thyroid Panel With TSH  3. Prediabetes - Comprehensive metabolic panel  4. Screening for diabetes mellitus - HgB A1c 5.6%  5. Avascular necrosis (HCC) - AMB referral to orthopedics    Follow-up: Return in about 8 weeks (around 05/19/2018) for Depression and hypothyroidism.   Clent Demark PA

## 2018-03-25 ENCOUNTER — Other Ambulatory Visit (INDEPENDENT_AMBULATORY_CARE_PROVIDER_SITE_OTHER): Payer: Self-pay | Admitting: Physician Assistant

## 2018-03-25 ENCOUNTER — Telehealth (INDEPENDENT_AMBULATORY_CARE_PROVIDER_SITE_OTHER): Payer: Self-pay

## 2018-03-25 DIAGNOSIS — Z760 Encounter for issue of repeat prescription: Secondary | ICD-10-CM

## 2018-03-25 DIAGNOSIS — E039 Hypothyroidism, unspecified: Secondary | ICD-10-CM

## 2018-03-25 LAB — COMPREHENSIVE METABOLIC PANEL
ALT: 6 IU/L (ref 0–32)
AST: 11 IU/L (ref 0–40)
Albumin/Globulin Ratio: 1.1 — ABNORMAL LOW (ref 1.2–2.2)
Albumin: 3.9 g/dL (ref 3.6–4.8)
Alkaline Phosphatase: 62 IU/L (ref 39–117)
BUN/Creatinine Ratio: 19 (ref 12–28)
BUN: 13 mg/dL (ref 8–27)
Bilirubin Total: 0.3 mg/dL (ref 0.0–1.2)
CALCIUM: 9.1 mg/dL (ref 8.7–10.3)
CO2: 25 mmol/L (ref 20–29)
CREATININE: 0.7 mg/dL (ref 0.57–1.00)
Chloride: 103 mmol/L (ref 96–106)
GFR, EST AFRICAN AMERICAN: 107 mL/min/{1.73_m2} (ref >59)
GFR, EST NON AFRICAN AMERICAN: 93 mL/min/{1.73_m2} (ref >59)
GLUCOSE: 133 mg/dL — AB (ref 65–99)
Globulin, Total: 3.4 g/dL (ref 1.5–4.5)
Potassium: 4 mmol/L (ref 3.5–5.2)
Sodium: 140 mmol/L (ref 134–144)
Total Protein: 7.3 g/dL (ref 6.0–8.5)

## 2018-03-25 LAB — THYROID PANEL WITH TSH
FREE THYROXINE INDEX: 2.2 (ref 1.2–4.9)
T3 Uptake Ratio: 23 % — ABNORMAL LOW (ref 24–39)
T4, Total: 9.6 ug/dL (ref 4.5–12.0)
TSH: 3.03 u[IU]/mL (ref 0.450–4.500)

## 2018-03-25 MED ORDER — LEVOTHYROXINE SODIUM 75 MCG PO TABS: 75.0000 ug | ORAL_TABLET | Freq: Every day | ORAL | 5 refills | Status: DC

## 2018-03-25 NOTE — Telephone Encounter (Signed)
-----   Message from Clent Demark, PA-C sent at 03/25/2018 12:15 PM EDT ----- Liver and kidney results are normal. Thyroid stable and I have sent a refill of her 75 mcg dose.

## 2018-03-25 NOTE — Telephone Encounter (Signed)
Called patient but received answering machine. The memory on answering machine is full, unable to leave message. Will call again. Nat Christen, CMA

## 2018-03-28 ENCOUNTER — Telehealth (INDEPENDENT_AMBULATORY_CARE_PROVIDER_SITE_OTHER): Payer: Self-pay

## 2018-03-28 NOTE — Telephone Encounter (Signed)
Patient called office and was provided results of normal liver and kidney and stable thyroid, refill of 75 mcg levothyroxine sent to pharmacy. Nat Christen, CMA

## 2018-03-28 NOTE — Telephone Encounter (Signed)
-----   Message from Clent Demark, PA-C sent at 03/25/2018 12:15 PM EDT ----- Liver and kidney results are normal. Thyroid stable and I have sent a refill of her 75 mcg dose.

## 2018-04-06 ENCOUNTER — Ambulatory Visit (INDEPENDENT_AMBULATORY_CARE_PROVIDER_SITE_OTHER): Payer: Self-pay | Admitting: Orthopaedic Surgery

## 2018-04-26 ENCOUNTER — Ambulatory Visit (INDEPENDENT_AMBULATORY_CARE_PROVIDER_SITE_OTHER): Payer: Medicare Other | Admitting: Orthopaedic Surgery

## 2018-04-27 ENCOUNTER — Other Ambulatory Visit: Payer: Self-pay

## 2018-04-27 DIAGNOSIS — E785 Hyperlipidemia, unspecified: Secondary | ICD-10-CM

## 2018-04-27 MED ORDER — PRAVASTATIN SODIUM 40 MG PO TABS: 40.0000 mg | ORAL_TABLET | Freq: Every day | ORAL | 1 refills | Status: DC

## 2018-05-08 ENCOUNTER — Emergency Department (HOSPITAL_COMMUNITY): Payer: Medicare Other

## 2018-05-08 ENCOUNTER — Emergency Department (HOSPITAL_COMMUNITY)
Admission: EM | Admit: 2018-05-08 | Discharge: 2018-05-08 | Disposition: A | Discharge status: Home / Self Care | Payer: Medicare Other | Attending: Emergency Medicine | Admitting: Emergency Medicine

## 2018-05-08 ENCOUNTER — Encounter (HOSPITAL_COMMUNITY): Payer: Self-pay

## 2018-05-08 ENCOUNTER — Other Ambulatory Visit: Payer: Self-pay

## 2018-05-08 DIAGNOSIS — Z9049 Acquired absence of other specified parts of digestive tract: Secondary | ICD-10-CM | POA: Insufficient documentation

## 2018-05-08 DIAGNOSIS — E039 Hypothyroidism, unspecified: Secondary | ICD-10-CM | POA: Insufficient documentation

## 2018-05-08 DIAGNOSIS — W230XXA Caught, crushed, jammed, or pinched between moving objects, initial encounter: Secondary | ICD-10-CM | POA: Diagnosis not present

## 2018-05-08 DIAGNOSIS — I129 Hypertensive chronic kidney disease with stage 1 through stage 4 chronic kidney disease, or unspecified chronic kidney disease: Secondary | ICD-10-CM | POA: Insufficient documentation

## 2018-05-08 DIAGNOSIS — F17210 Nicotine dependence, cigarettes, uncomplicated: Secondary | ICD-10-CM | POA: Insufficient documentation

## 2018-05-08 DIAGNOSIS — S82851A Displaced trimalleolar fracture of right lower leg, initial encounter for closed fracture: Secondary | ICD-10-CM | POA: Diagnosis not present

## 2018-05-08 DIAGNOSIS — N189 Chronic kidney disease, unspecified: Secondary | ICD-10-CM | POA: Diagnosis not present

## 2018-05-08 DIAGNOSIS — S82891A Other fracture of right lower leg, initial encounter for closed fracture: Secondary | ICD-10-CM

## 2018-05-08 DIAGNOSIS — S99911A Unspecified injury of right ankle, initial encounter: Secondary | ICD-10-CM | POA: Diagnosis present

## 2018-05-08 DIAGNOSIS — F329 Major depressive disorder, single episode, unspecified: Secondary | ICD-10-CM | POA: Insufficient documentation

## 2018-05-08 DIAGNOSIS — M25551 Pain in right hip: Secondary | ICD-10-CM | POA: Diagnosis not present

## 2018-05-08 DIAGNOSIS — R110 Nausea: Secondary | ICD-10-CM | POA: Diagnosis not present

## 2018-05-08 DIAGNOSIS — Z79899 Other long term (current) drug therapy: Secondary | ICD-10-CM | POA: Insufficient documentation

## 2018-05-08 DIAGNOSIS — M25552 Pain in left hip: Secondary | ICD-10-CM | POA: Diagnosis not present

## 2018-05-08 DIAGNOSIS — Y9389 Activity, other specified: Secondary | ICD-10-CM | POA: Insufficient documentation

## 2018-05-08 DIAGNOSIS — M25561 Pain in right knee: Secondary | ICD-10-CM | POA: Diagnosis not present

## 2018-05-08 DIAGNOSIS — R0902 Hypoxemia: Secondary | ICD-10-CM | POA: Diagnosis not present

## 2018-05-08 DIAGNOSIS — S82854A Nondisplaced trimalleolar fracture of right lower leg, initial encounter for closed fracture: Secondary | ICD-10-CM | POA: Insufficient documentation

## 2018-05-08 DIAGNOSIS — Y929 Unspecified place or not applicable: Secondary | ICD-10-CM | POA: Diagnosis not present

## 2018-05-08 DIAGNOSIS — Y998 Other external cause status: Secondary | ICD-10-CM | POA: Diagnosis not present

## 2018-05-08 DIAGNOSIS — M2550 Pain in unspecified joint: Secondary | ICD-10-CM | POA: Diagnosis not present

## 2018-05-08 DIAGNOSIS — R29898 Other symptoms and signs involving the musculoskeletal system: Secondary | ICD-10-CM | POA: Diagnosis not present

## 2018-05-08 DIAGNOSIS — Z96651 Presence of right artificial knee joint: Secondary | ICD-10-CM | POA: Diagnosis not present

## 2018-05-08 DIAGNOSIS — Z7401 Bed confinement status: Secondary | ICD-10-CM | POA: Diagnosis not present

## 2018-05-08 DIAGNOSIS — I10 Essential (primary) hypertension: Secondary | ICD-10-CM | POA: Diagnosis not present

## 2018-05-08 MED ORDER — HYDROMORPHONE HCL 1 MG/ML IJ SOLN
1.0000 mg | Freq: Once | INTRAMUSCULAR | Status: AC
  Administered 2018-05-08: 1 mg via INTRAVENOUS

## 2018-05-08 MED ORDER — HYDROMORPHONE HCL 1 MG/ML IJ SOLN
1.0000 mg | Freq: Once | INTRAMUSCULAR | Status: DC
  Filled 2018-05-08: qty 1

## 2018-05-08 MED ORDER — HYDROCODONE-ACETAMINOPHEN 5-325 MG PO TABS: 1.0000 | ORAL_TABLET | Freq: Four times a day (QID) | ORAL | 0 refills | Status: DC | PRN

## 2018-05-08 MED ORDER — HYDROMORPHONE HCL 1 MG/ML IJ SOLN
1.0000 mg | Freq: Once | INTRAMUSCULAR | Status: AC
  Administered 2018-05-08: 1 mg via INTRAVENOUS
  Filled 2018-05-08: qty 1

## 2018-05-08 NOTE — ED Notes (Signed)
[facility] has been called and will be in route to transport patient ASAP.

## 2018-05-08 NOTE — ED Triage Notes (Signed)
Pt comes from home. Pt is AOx4, and uses electric wheelchair to move around. Pt called 911 due to pts electric wheelchair running over right lower leg.   There is deformity to right lower leg. Pulses are palpable and still has sensation bilaterally and equally.  125mcg of Fentanyl given in route IV by EMS and 4mg  of Zofran IV.

## 2018-05-08 NOTE — ED Notes (Signed)
Pt has been informed that insurance may or may not cover patient for [facility] transportation back to living area. Pt is aware and has agreed to be transported via [facility].

## 2018-05-08 NOTE — ED Provider Notes (Signed)
[place] DEPT Provider Note   CSN: [id number] Arrival date & time: 05/08/18  1224     History   Chief Complaint Chief Complaint  Patient presents with  . Right Lower Leg Injury    HPI [name] is a 62 y.o. female.  HPI Patient presents to the emergency room for evaluation of a lower extremity injury.  Patient uses an electric wheelchair due to mobility issues.  Patient unfortunately got her right foot caught underneath the wheelchair and felt a snap in her right lower leg.  Patient is complaining of pain in her right lower leg.  She also has some pain in her left knee.  She has some soreness in her hips but primarily has pain in the right lower leg and ankle.  She denies any other difficulties with chest pain or shortness of breath.  Patient does denies any numbness or weakness.  She does have sensation in her foot.  EMS was called and she was given 100 mcg of fentanyl IV and 4 mg of Zofran. Past Medical History:  Diagnosis Date  . Anemia   . Arthritis   . AVN (avascular necrosis of bone) ([place]) 04/14/2013  . Chronic kidney disease   . Complication of anesthesia   . Decreased mobility    hx. Rt. hip "avascular necrosis" -unable to weight bear long periods, "using wheelchair"..  . Depression   . GERD (gastroesophageal reflux disease)   . Hep B w/o coma   . Hep C w/o coma, chronic ([place])    07-20-14 being presently tx. "[facility]"-good response per pt. will complete in 7days- Dr. Linus Salmons follows [place] [facility].  . Hypertension   . Hypothyroid    mild- no medication  . Knee stiffness    right knee -hard to bend at knee  . Muscle cramps   . Obesity   . Osteoporosis   . Pneumonia   . PONV (postoperative nausea and vomiting)    only with Demerol  . Shortness of breath dyspnea    still smokes  . Superficial thrombophlebitis    left leg  . Transfusion history    70's, 80's ? hepatitis C attributed to past transfusions     Patient Active Problem List   Diagnosis Date Noted  . Depression with anxiety 03/24/2018  . Osteopenia 02/15/2017  . Dyslipidemia with high LDL and low HDL 02/01/2017  . Morbid obesity ([place]) 02/01/2017  . Seborrheic keratoses 01/25/2017  . Filiform wart 01/25/2017  . Vitamin D deficiency 06/19/2016  . Left carpal tunnel syndrome 03/23/2016  . Benign neoplasm of transverse colon   . Benign neoplasm of descending colon   . Benign neoplasm of ascending colon   . Gastroesophageal reflux disease with esophagitis   . Depression 12/24/2015  . Numbness and tingling in left hand 12/24/2015  . History of hepatitis C 08/26/2015  . Elevated hemoglobin A1c 08/26/2015  . Chronic diarrhea 05/28/2015  . Benign neoplasm of colon 02/26/2015  . Varicose veins of both lower extremities with complications 89/38/1017  . Chronic venous insufficiency 01/11/2015  . Hepatic cirrhosis ([place]) 08/07/2014  . Change in bowel habits 07/19/2014  . Essential hypertension, benign 03/19/2014  . Smoking 03/19/2014  . Numerous moles 03/19/2014  . Leg length discrepancy 04/14/2013  . HTN (hypertension) 03/28/2013  . CLAUDICATION 04/29/2009  . Hypothyroidism 04/25/2009  . Thrombocytopenia ([place]) 04/25/2009    Past Surgical History:  Procedure Laterality Date  . ABDOMINAL HYSTERECTOMY    . APPENDECTOMY  thinks it was removed with hysterectomy  . arthroscopies     knee-right x 7  . CHOLECYSTECTOMY    . COLON SURGERY    . COLONOSCOPY    . COLONOSCOPY WITH PROPOFOL N/A 02/26/2015   Procedure: COLONOSCOPY WITH PROPOFOL;  Surgeon: Inda Castle, MD;  Location: [facility] ENDOSCOPY;  Service: Endoscopy;  Laterality: N/A;  . COLONOSCOPY WITH PROPOFOL N/A 03/03/2016   Procedure: COLONOSCOPY WITH PROPOFOL;  Surgeon: Mauri Pole, MD;  Location: [facility] ENDOSCOPY;  Service: Endoscopy;  Laterality: N/A;  . ESOPHAGOGASTRODUODENOSCOPY (EGD) WITH PROPOFOL N/A 03/03/2016   Procedure: ESOPHAGOGASTRODUODENOSCOPY (EGD) WITH  PROPOFOL;  Surgeon: Mauri Pole, MD;  Location: [facility] ENDOSCOPY;  Service: Endoscopy;  Laterality: N/A;  . JOINT REPLACEMENT     right knee  . TOTAL KNEE ARTHROPLASTY       OB History   None      Home Medications    Prior to Admission medications   Medication Sig Start Date End Date Taking? Authorizing Provider  amLODipine (NORVASC) 2.5 MG tablet Take 1 tablet (2.5 mg total) by mouth daily. 12/23/17  Yes Gildardo Pounds, NP  diclofenac sodium (VOLTAREN) 1 % GEL Apply 2 g topically 4 (four) times daily as needed (pain). 12/04/15  Yes Bayard Hugger, NP  diphenoxylate-atropine (LOMOTIL) 2.5-0.025 MG tablet TAKE 1 TABLET BY MOUTH FOUR TIMES A DAY AS NEEDED FOR DIARRHEA OR LOOSE STOOLS Patient taking differently: Take 1 tablet by mouth 4 (four) times daily as needed for diarrhea or loose stools.  12/23/17  Yes Gildardo Pounds, NP  ibuprofen (ADVIL,MOTRIN) 200 MG tablet Take 200 mg by mouth every 6 (six) hours as needed for headache or moderate pain.   Yes [provider]  levothyroxine (SYNTHROID, LEVOTHROID) 75 MCG tablet Take 1 tablet (75 mcg total) by mouth daily. 03/25/18  Yes Clent Demark, PA-C  lisinopril (PRINIVIL,ZESTRIL) 40 MG tablet Take 1 tablet (40 mg total) by mouth daily. Patient taking differently: Take 40 mg by mouth at bedtime.  12/23/17  Yes Gildardo Pounds, NP  meclizine (ANTIVERT) 12.5 MG tablet Take 1 tablet (12.5 mg total) by mouth 3 (three) times daily as needed for dizziness. 12/23/17  Yes Gildardo Pounds, NP  pravastatin (PRAVACHOL) 40 MG tablet Take 1 tablet (40 mg total) by mouth daily. 04/27/18  Yes Clent Demark, PA-C  ranitidine (ZANTAC) 300 MG tablet TAKE 1 TABLET BY MOUTH AT BEDTIME AS NEEDED FOR HEARTBURN Patient taking differently: Take 300 mg by mouth daily as needed for heartburn.  10/13/17  Yes Nandigam, Venia Minks, MD  sertraline (ZOLOFT) 50 MG tablet Take 1 tablet (50 mg total) by mouth at bedtime. Patient taking differently: Take 25  mg by mouth at bedtime.  09/06/17  Yes Hairston, Toy Baker R, FNP  Calcium Citrate 200 MG TABS Take 2 tablets (400 mg total) by mouth daily. Patient not taking: Reported on 03/24/2018 03/05/17   Boykin Nearing, MD  HYDROcodone-acetaminophen (NORCO/VICODIN) 5-325 MG tablet Take 1 tablet by mouth every 6 (six) hours as needed. 05/08/18   Dorie Rank, MD  miconazole (MICOTIN) 2 % cream APPLY TOPICALLY AS DIRECTED TWICE A DAY Patient not taking: Reported on 05/08/2018 04/30/16   Boykin Nearing, MD  omeprazole (PRILOSEC) 40 MG capsule Take 1 capsule (40 mg total) by mouth daily. Patient not taking: Reported on 05/08/2018 12/23/17   Gildardo Pounds, NP  DULoxetine (CYMBALTA) 20 MG capsule Take 60 mg by mouth daily.  08/22/12  [provider]  Family History Family History  Problem Relation Age of Onset  . Cancer Mother   . Diabetes Mother   . Heart disease Mother   . Hypertension Mother   . Cancer Father   . Hypertension Sister   . Hypertension Brother   . Diabetes Brother   . Colon cancer Neg Hx     Social History Social History   Tobacco Use  . Smoking status: Current Every Day Smoker    Packs/day: 0.50    Years: 20.00    Pack years: 10.00    Types: Cigarettes  . Smokeless tobacco: Never Used  . Tobacco comment: trying to cut back  Substance Use Topics  . Alcohol use: No    Alcohol/week: 0.0 standard drinks  . Drug use: No     Allergies   Penicillins; Demerol; and Meperidine   Review of Systems Review of Systems  All other systems reviewed and are negative.    Physical Exam Updated Vital Signs BP 126/89   Pulse 71   Resp 15   Ht 1.676 m (5\' 6" )   Wt (!) 148.2 kg   SpO2 92%   BMI 52.73 kg/m   Physical Exam  Constitutional: She appears well-developed and well-nourished. No distress.  Morbidly obese  HENT:  Head: Normocephalic and atraumatic.  Right Ear: External ear normal.  Left Ear: External ear normal.  Eyes: Conjunctivae are normal. Right eye  exhibits no discharge. Left eye exhibits no discharge. No scleral icterus.  Neck: Neck supple. No tracheal deviation present.  Cardiovascular: Normal rate, regular rhythm and normal heart sounds.  Pulmonary/Chest: Effort normal and breath sounds normal. No stridor. No respiratory distress.  Abdominal: Soft. She exhibits no distension. There is no tenderness. There is no guarding.  Musculoskeletal: She exhibits tenderness. She exhibits no edema or deformity.       Right hip: Normal.       Left hip: Normal.       Right knee: Normal.       Left knee: She exhibits no swelling. Tenderness found.       Right ankle: She exhibits decreased range of motion. Tenderness.       Right lower leg: She exhibits tenderness and bony tenderness.  Neurological: She is alert. Cranial nerve deficit: no gross deficits.  Skin: Skin is warm and dry. No rash noted.  Psychiatric: She has a normal mood and affect.  Nursing note and vitals reviewed.    ED Treatments / Results  Labs (all labs ordered are listed, but only abnormal results are displayed) Labs Reviewed - No data to display  EKG None  Radiology Dg Tibia/fibula Right  Result Date: 05/08/2018 CLINICAL DATA:  Pt states she accidentally caught her right leg in her electric wheelchair today. C/o pain to medial and lateral malleolus of right ankle. Also c/o pain to right lower tib/fib and anterior left knee. Pt also c/o chronic bilateral hip pain, h/o AVN. EXAM: RIGHT TIBIA AND FIBULA - 2 VIEW COMPARISON:  None. FINDINGS: Status post total knee arthroplasty. The hardware appears well seated. There is an oblique fracture of the distal fibula. There are acute fractures of the MEDIAL and posterior malleolus. There is diffuse soft tissue swelling. Osteopenic. IMPRESSION: Trimalleolar fracture of the ankle. Status post knee arthroplasty. Diffuse soft tissue swelling. Electronically Signed   By: Nolon Nations M.D.   On: 05/08/2018 14:33   Dg Ankle Complete  Right  Result Date: 05/08/2018 CLINICAL DATA:  Pt states she accidentally caught her right  leg in her electric wheelchair today. C/o pain to medial and lateral malleolus of right ankle. Also c/o pain to right lower tib/fib and anterior left knee. Pt also c/o chronic bilateral hip pain, h/o AVN. EXAM: RIGHT ANKLE - COMPLETE 3+ VIEW COMPARISON:  None 119 and 02/14/2010 FINDINGS: There is an oblique fracture of the distal fibula. Transverse fracture of the MEDIAL malleolus. There is a fracture of the posterior malleolus. Diffuse soft tissue swelling. IMPRESSION: Trimalleolar fracture of the ankle.  Diffuse soft tissue swelling. Electronically Signed   By: Nolon Nations M.D.   On: 05/08/2018 14:34   Dg Knee Complete 4 Views Left  Result Date: 05/08/2018 CLINICAL DATA:  Pt states she accidentally caught her right leg in her electric wheelchair today. C/o pain to medial and lateral malleolus of right ankle. Also c/o pain to right lower tib/fib and anterior left knee. Pt also c/o chronic bilateral hip pain, h/o AVN. EXAM: LEFT KNEE - COMPLETE 4+ VIEW COMPARISON:  None. FINDINGS: There are tricompartmental degenerative changes in the knee. No acute fracture. No joint effusion. No suspicious lytic or blastic lesions are identified. IMPRESSION: Degenerative changes.  No evidence for acute  abnormality. Electronically Signed   By: Nolon Nations M.D.   On: 05/08/2018 14:29   Dg Hips Bilat W Or Wo Pelvis 3-4 Views  Result Date: 05/08/2018 CLINICAL DATA:  Pt states she accidentally caught her right leg in her electric wheelchair today. C/o pain to medial and lateral malleolus of right ankle. Also c/o pain to right lower tib/fib and anterior left knee. Pt also c/o chronic bilateral hip pain, h/o AVN. EXAM: DG HIP (WITH OR WITHOUT PELVIS) 3-4V BILAT COMPARISON:  07/24/2015 FINDINGS: Exam is limited by patient body habitus. No evidence of pelvic fracture. There is remodeling of the RIGHT femoral head unchanged from  2016. Dedicated views of the LEFT hip demonstrate no fracture IMPRESSION: No evidence of pelvic fracture or LEFT hip fracture. Severe degenerate remodeling of RIGHT femoral head. Electronically Signed   By: Suzy Bouchard M.D.   On: 05/08/2018 14:29    Procedures .Splint Application Date/Time: 05/12/6212 4:35 PM Performed by: Dorie Rank, MD Authorized by: Dorie Rank, MD   Consent:    Consent obtained:  Verbal   Consent given by:  Patient   Risks discussed:  Discoloration, numbness and pain   Alternatives discussed:  No treatment Post-procedure details:    Pain:  Improved   Sensation:  Normal   Patient tolerance of procedure:  Tolerated well, no immediate complications Comments:     Splint was applied by the orthopedic technician.  Patient was examined after splint application.  Patient tolerated procedure well   (including critical care time)  Medications Ordered in ED Medications  HYDROmorphone (DILAUDID) injection 1 mg (1 mg Intravenous Given 05/08/18 1320)  HYDROmorphone (DILAUDID) injection 1 mg (1 mg Intravenous Given 05/08/18 1542)     Initial Impression / Assessment and Plan / ED Course  I have reviewed the triage vital signs and the nursing notes.  Pertinent labs & imaging results that were available during my care of the patient were reviewed by me and considered in my medical decision making (see chart for details).  Clinical Course as of May 09 1635  Nancy Fetter May 08, 2018  1541 Discussed with Dr Ninfa Linden.  Films reviewed.  Will splint, follow up as an outpatient.   [JK]    Clinical Course User Index [JK] Dorie Rank, MD    Patient's x-rays were notable for  a trimalleolar ankle fracture.  Discussed case with Dr. Ninfa Linden.  Patient will follow-up with him as an outpatient.  Patient was splinted by the orthopedic tech.  Patient will follow-up with orthopedics as an outpatient.  She has a motorized wheelchair for ambulation.  Patient normally uses a walker to transfer.  She  might need some assistance with transfer at home.  I have ordered home health and case management follow-up as an outpatient.  Final Clinical Impressions(s) / ED Diagnoses   Final diagnoses:  Closed fracture of right ankle, initial encounter    ED Discharge Orders         Ordered    HYDROcodone-acetaminophen (NORCO/VICODIN) 5-325 MG tablet  Every 6 hours PRN     05/08/18 [place]     05/08/18 1633    Face-to-face encounter (required for Medicare/Medicaid patients)    Comments:  I Dorie Rank certify that this patient is under my care and that I, or a nurse practitioner or physician's assistant working with me, had a face-to-face encounter that meets the physician face-to-face encounter requirements with this patient on 05/08/2018. The encounter with the patient was in whole, or in part for the following medical condition(s) which is the primary reason for home health care (List medical condition): ankle fracture   05/08/18 1633           Dorie Rank, MD 05/08/18 1636

## 2018-05-08 NOTE — Discharge Instructions (Signed)
Keep the splint on and try to keep your leg elevated, do not put any weight on your right ankle, follow-up with Dr. Ninfa Linden for further evaluation

## 2018-05-09 ENCOUNTER — Telehealth: Payer: Self-pay | Admitting: *Deleted

## 2018-05-09 NOTE — Telephone Encounter (Signed)
EDCM left vm for pt to call back to set up [facility] services.

## 2018-05-10 ENCOUNTER — Telehealth: Payer: Self-pay | Admitting: *Deleted

## 2018-05-10 NOTE — Telephone Encounter (Signed)
EDCM has recommended that this patient have [facility] but pt declines at this time. She states she would like to wait until after her surgery. I have discussed the benefits of home health services as wll as the risks of not having home health services with pt. [facility] informed patient that if she changed her mind, her primary care physician can order home health from office.The patient verbalizes understanding. No further EDCM needs identified at this time.

## 2018-05-11 ENCOUNTER — Encounter (INDEPENDENT_AMBULATORY_CARE_PROVIDER_SITE_OTHER): Payer: Self-pay | Admitting: Orthopaedic Surgery

## 2018-05-11 ENCOUNTER — Ambulatory Visit (INDEPENDENT_AMBULATORY_CARE_PROVIDER_SITE_OTHER): Payer: Medicare Other | Admitting: Orthopaedic Surgery

## 2018-05-11 DIAGNOSIS — S82841A Displaced bimalleolar fracture of right lower leg, initial encounter for closed fracture: Secondary | ICD-10-CM | POA: Insufficient documentation

## 2018-05-11 MED ORDER — ONDANSETRON 4 MG PO TBDP: 4.0000 mg | ORAL_TABLET | Freq: Three times a day (TID) | ORAL | 0 refills | Status: DC | PRN

## 2018-05-11 MED ORDER — OXYCODONE HCL 5 MG PO TABS: 5.0000 mg | ORAL_TABLET | ORAL | 0 refills | Status: DC | PRN
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
## 2018-05-16 ENCOUNTER — Ambulatory Visit (INDEPENDENT_AMBULATORY_CARE_PROVIDER_SITE_OTHER): Payer: Medicare Other | Admitting: Orthopaedic Surgery

## 2018-05-18 ENCOUNTER — Ambulatory Visit (INDEPENDENT_AMBULATORY_CARE_PROVIDER_SITE_OTHER): Payer: Medicare Other | Admitting: Physician Assistant

## 2018-05-19 ENCOUNTER — Ambulatory Visit (INDEPENDENT_AMBULATORY_CARE_PROVIDER_SITE_OTHER): Payer: Medicare Other | Admitting: Physician Assistant

## 2018-05-19 ENCOUNTER — Ambulatory Visit (INDEPENDENT_AMBULATORY_CARE_PROVIDER_SITE_OTHER): Payer: Medicare Other

## 2018-05-19 ENCOUNTER — Encounter (INDEPENDENT_AMBULATORY_CARE_PROVIDER_SITE_OTHER): Payer: Self-pay | Admitting: Physician Assistant

## 2018-05-19 DIAGNOSIS — M25571 Pain in right ankle and joints of right foot: Secondary | ICD-10-CM | POA: Diagnosis not present

## 2018-05-19 DIAGNOSIS — S82851D Displaced trimalleolar fracture of right lower leg, subsequent encounter for closed fracture with routine healing: Secondary | ICD-10-CM

## 2018-05-19 NOTE — Progress Notes (Signed)
Office Visit Note   Patient: [name]           Date of Birth: May 03, 1956           MRN: [id number] Visit Date: 05/19/2018              Requested by: Clent Demark, PA-C [place], [place] [address] PCP: Clent Demark, PA-C   Assessment & Plan: Visit Diagnoses:  1. Pain in right ankle and joints of right foot     Plan: She will start 81 mg aspirin once daily.  She is placed in a well-padded short leg cast she is nonweightbearing right lower extremity.  See her back in 3 weeks remove the cast and obtain 3 views of the right ankle.  Follow-Up Instructions: Return in about 3 weeks (around 06/09/2018) for Radiographs.   Orders:  Orders Placed This Encounter  Procedures  . XR Ankle Complete Right   No orders of the defined types were placed in this encounter.     Procedures: No procedures performed   Clinical Data: No additional findings.   Subjective: Chief Complaint  Patient presents with  . Right Ankle - Fracture, Follow-up    HPI Patient is here today for follow-up of her trimalleolar ankle fracture which she sustained on 05/08/2018.  Is been in a splint nonweightbearing.  She has had no chest pain shortness breath fevers chills. Review of Systems  See HPI otherwise negative Objective: Vital Signs: There were no vitals taken for this visit.  Physical Exam  Ortho Exam Right ankle tenderness over the medial lateral malleolus.  Right calf supple nontender.  No rash skin lesions ulcerations or impending ulcers. Specialty Comments:  No specialty comments available.  Imaging: Xr Ankle Complete Right  Result Date: 05/19/2018 Right ankle 3 views: Talus remains well located within the ankle mortise without diastases.  Trimalleolar ankle fracture is remained in overall good position alignment.  No other bony abnormalities.    PMFS History: Patient Active Problem List   Diagnosis Date Noted  . Ankle fracture, bimalleolar, closed,  right, initial encounter 05/11/2018  . Depression with anxiety 03/24/2018  . Osteopenia 02/15/2017  . Dyslipidemia with high LDL and low HDL 02/01/2017  . Morbid obesity ([place]) 02/01/2017  . Seborrheic keratoses 01/25/2017  . Filiform wart 01/25/2017  . Vitamin D deficiency 06/19/2016  . Left carpal tunnel syndrome 03/23/2016  . Benign neoplasm of transverse colon   . Benign neoplasm of descending colon   . Benign neoplasm of ascending colon   . Gastroesophageal reflux disease with esophagitis   . Depression 12/24/2015  . Numbness and tingling in left hand 12/24/2015  . History of hepatitis C 08/26/2015  . Elevated hemoglobin A1c 08/26/2015  . Chronic diarrhea 05/28/2015  . Benign neoplasm of colon 02/26/2015  . Varicose veins of both lower extremities with complications 84/69/6295  . Chronic venous insufficiency 01/11/2015  . Hepatic cirrhosis ([place]) 08/07/2014  . Change in bowel habits 07/19/2014  . Essential hypertension, benign 03/19/2014  . Smoking 03/19/2014  . Numerous moles 03/19/2014  . Leg length discrepancy 04/14/2013  . HTN (hypertension) 03/28/2013  . CLAUDICATION 04/29/2009  . Hypothyroidism 04/25/2009  . Thrombocytopenia ([place]) 04/25/2009   Past Medical History:  Diagnosis Date  . Anemia   . Arthritis   . AVN (avascular necrosis of bone) ([place]) 04/14/2013  . Chronic kidney disease   . Complication of anesthesia   . Decreased mobility    hx. Rt. hip "avascular  necrosis" -unable to weight bear long periods, "using wheelchair"..  . Depression   . GERD (gastroesophageal reflux disease)   . Hep B w/o coma   . Hep C w/o coma, chronic ([place])    07-20-14 being presently tx. "Harvoni"-good response per pt. will complete in 7days- Dr. Linus Salmons follows [facility] infectious disease [facility].  . Hypertension   . Hypothyroid    mild- no medication  . Knee stiffness    right knee -hard to bend at knee  . Muscle cramps   . Obesity   . Osteoporosis   . Pneumonia   . PONV  (postoperative nausea and vomiting)    only with Demerol  . Shortness of breath dyspnea    still smokes  . Superficial thrombophlebitis    left leg  . Transfusion history    70's, 80's ? hepatitis C attributed to past transfusions    Family History  Problem Relation Age of Onset  . Cancer Mother   . Diabetes Mother   . Heart disease Mother   . Hypertension Mother   . Cancer Father   . Hypertension Sister   . Hypertension Brother   . Diabetes Brother   . Colon cancer Neg Hx     Past Surgical History:  Procedure Laterality Date  . ABDOMINAL HYSTERECTOMY    . APPENDECTOMY     thinks it was removed with hysterectomy  . arthroscopies     knee-right x 7  . CHOLECYSTECTOMY    . COLON SURGERY    . COLONOSCOPY    . COLONOSCOPY WITH PROPOFOL N/A 02/26/2015   Procedure: COLONOSCOPY WITH PROPOFOL;  Surgeon: Inda Castle, MD;  [facility];  Service: Endoscopy;  Laterality: N/A;  . COLONOSCOPY WITH PROPOFOL N/A 03/03/2016   Procedure: COLONOSCOPY WITH PROPOFOL;  Surgeon: Mauri Pole, MD;  [facility];  Service: Endoscopy;  Laterality: N/A;  . ESOPHAGOGASTRODUODENOSCOPY (EGD) WITH PROPOFOL N/A 03/03/2016   Procedure: ESOPHAGOGASTRODUODENOSCOPY (EGD) WITH PROPOFOL;  Surgeon: Mauri Pole, MD;  [facility];  Service: Endoscopy;  Laterality: N/A;  . JOINT REPLACEMENT     right knee  . TOTAL KNEE ARTHROPLASTY     Social History   Occupational History  . Not on file  Tobacco Use  . Smoking status: Current Every Day Smoker    Packs/day: 0.50    Years: 20.00    Pack years: 10.00    Types: Cigarettes  . Smokeless tobacco: Never Used  . Tobacco comment: trying to cut back  Substance and Sexual Activity  . Alcohol use: No    Alcohol/week: 0.0 standard drinks  . Drug use: No  . Sexual activity: Not Currently

## 2018-06-09 ENCOUNTER — Ambulatory Visit (INDEPENDENT_AMBULATORY_CARE_PROVIDER_SITE_OTHER): Payer: Medicare Other

## 2018-06-09 ENCOUNTER — Encounter (INDEPENDENT_AMBULATORY_CARE_PROVIDER_SITE_OTHER): Payer: Self-pay | Admitting: Physician Assistant

## 2018-06-09 ENCOUNTER — Ambulatory Visit (INDEPENDENT_AMBULATORY_CARE_PROVIDER_SITE_OTHER): Payer: Medicare Other | Admitting: Physician Assistant

## 2018-06-09 DIAGNOSIS — S82851D Displaced trimalleolar fracture of right lower leg, subsequent encounter for closed fracture with routine healing: Secondary | ICD-10-CM

## 2018-06-09 MED ORDER — OXYCODONE HCL 5 MG PO TABS: 5.0000 mg | ORAL_TABLET | ORAL | 0 refills | Status: DC | PRN

## 2018-06-09 NOTE — Progress Notes (Signed)
HPI: Ms. Ladouceur returns today for follow-up of her right ankle trimalleolar fracture.  She is been in a short leg cast.  She denies any chest pain shortness of breath or calf pain.  States her pain is 8 out of 10 most the time.  She is been nonweightbearing.  Review of systems see HPI otherwise noncontributory.  Physical exam: Well-developed will nourished female in no acute distress. Right lower extremity calf supple nontender.  She has tenderness over the medial lateral malleolus.  No rashes skin lesions ulcerations or impending ulcers.  Sensation grossly intact throughout the foot.  Dorsal pedal pulse present.  Radiographs: 3 views of the right ankle talus well located within the ankle mortise no diastases.  Some early interval healing particularly of the lateral malleolus fracture.  No change in overall position alignment.  Impression: 3 weeks status post conservative treatment of a trimalleolar right ankle fracture.  Plan: She is placed in a cam walker boot she will be touchdown weightbearing.  She is taken off for hygiene purposes.  In 2 weeks she can begin weightbearing as tolerated in cam walker boot.  Questions encouraged and answered.  We will see her back in 4 weeks and obtain 3 views of the right ankle.  Questions were encouraged and answered.

## 2018-06-13 ENCOUNTER — Ambulatory Visit (INDEPENDENT_AMBULATORY_CARE_PROVIDER_SITE_OTHER): Payer: Medicare Other | Admitting: Physician Assistant

## 2018-06-27 ENCOUNTER — Ambulatory Visit (HOSPITAL_COMMUNITY)
Admission: RE | Admit: 2018-06-27 | Discharge: 2018-06-27 | Disposition: A | Discharge status: Home / Self Care | Payer: Medicare Other | Source: Ambulatory Visit | Attending: Physician Assistant | Admitting: Physician Assistant

## 2018-06-27 ENCOUNTER — Ambulatory Visit (INDEPENDENT_AMBULATORY_CARE_PROVIDER_SITE_OTHER): Payer: Medicare Other

## 2018-06-27 ENCOUNTER — Encounter (INDEPENDENT_AMBULATORY_CARE_PROVIDER_SITE_OTHER): Payer: Self-pay | Admitting: Physician Assistant

## 2018-06-27 ENCOUNTER — Ambulatory Visit (INDEPENDENT_AMBULATORY_CARE_PROVIDER_SITE_OTHER): Payer: Medicare Other | Admitting: Physician Assistant

## 2018-06-27 DIAGNOSIS — M7989 Other specified soft tissue disorders: Secondary | ICD-10-CM | POA: Insufficient documentation

## 2018-06-27 DIAGNOSIS — S82851D Displaced trimalleolar fracture of right lower leg, subsequent encounter for closed fracture with routine healing: Secondary | ICD-10-CM

## 2018-06-27 DIAGNOSIS — M79661 Pain in right lower leg: Secondary | ICD-10-CM

## 2018-06-27 NOTE — Progress Notes (Signed)
HPI: Ms. Yono returns today follow-up of her trimalleolar right ankle fracture.  She is in a cam walker boot.  However the boot is got caught on her wheelchair at least once causing her to fold her leg underneath the chair.  She is concerned about the foot pointing laterally.  She cannot weight-bear leg swollen.  Again she fractured the right ankle on 05/08/2018. No shortness of breath chest pain  Physical exam: Right ankle she has tenderness of the lateral medial malleoli.  Is able to dorsiflex plantarflex ankle with discomfort.  Her entire thigh and knee externally rotate.  The knee and the mid talus are aligned.  Tenderness right calf with palpation.  Significant swelling of the right lower leg.  Radiographs: 3 views right ankle talus well located within the ankle mortise.  There is been interval shortening and displacement of the lateral malleolus.  However there is a consolidation as well of the lateral malleolus.  Medial malleolus remains virtually unchanged.  No new fractures.   Impression: Right ankle trimalleolar fracture Right calf swelling and tenderness  Plan: We will send her for a Doppler of the right lower leg to evaluate for DVT.  We will then have her return later today for recasting of the ankle lower leg with a short leg cast.  Discussed patient with Dr. Ninfa Linden this point time he continue conservative treatment with casting for another month.  Doppler was performed and there was no evidence of DVT.  Recommend elevation wiggling toes.  Follow-up in 1 month 3 views of the right ankle at that time.

## 2018-06-27 NOTE — Progress Notes (Signed)
VASCULAR LAB PRELIMINARY  PRELIMINARY  PRELIMINARY  PRELIMINARY  Right lower extremity venous duplex completed.    Preliminary report:  There is no obvious evidence of DVT or SVT noted in the right lower extremity.  Called results to [place], RVT 06/27/2018, 1:43 PM

## 2018-07-01 ENCOUNTER — Telehealth (INDEPENDENT_AMBULATORY_CARE_PROVIDER_SITE_OTHER): Payer: Self-pay

## 2018-07-01 NOTE — Telephone Encounter (Signed)
Patient is requesting refill of miconazole sent to [facility]. Nat Christen, CMA

## 2018-07-05 NOTE — Telephone Encounter (Signed)
Miconazole is OTC.

## 2018-07-06 NOTE — Telephone Encounter (Signed)
Patient is aware. [name], CMA  

## 2018-07-07 ENCOUNTER — Ambulatory Visit (INDEPENDENT_AMBULATORY_CARE_PROVIDER_SITE_OTHER): Payer: Medicare Other | Admitting: Physician Assistant

## 2018-07-25 ENCOUNTER — Ambulatory Visit (INDEPENDENT_AMBULATORY_CARE_PROVIDER_SITE_OTHER): Payer: Medicare Other | Admitting: Physician Assistant

## 2018-07-25 ENCOUNTER — Telehealth (INDEPENDENT_AMBULATORY_CARE_PROVIDER_SITE_OTHER): Payer: Self-pay | Admitting: Orthopaedic Surgery

## 2018-07-25 ENCOUNTER — Encounter (INDEPENDENT_AMBULATORY_CARE_PROVIDER_SITE_OTHER): Payer: Self-pay

## 2018-07-25 NOTE — Telephone Encounter (Signed)
Returned called to patient left message to call back to reschedule her appointment

## 2018-07-28 ENCOUNTER — Ambulatory Visit (INDEPENDENT_AMBULATORY_CARE_PROVIDER_SITE_OTHER): Payer: Medicare Other | Admitting: Physician Assistant

## 2018-08-01 ENCOUNTER — Ambulatory Visit (INDEPENDENT_AMBULATORY_CARE_PROVIDER_SITE_OTHER): Payer: Medicare Other | Admitting: Physician Assistant

## 2018-08-01 ENCOUNTER — Encounter (INDEPENDENT_AMBULATORY_CARE_PROVIDER_SITE_OTHER): Payer: Self-pay | Admitting: Physician Assistant

## 2018-08-01 ENCOUNTER — Ambulatory Visit (INDEPENDENT_AMBULATORY_CARE_PROVIDER_SITE_OTHER): Payer: Medicare Other

## 2018-08-01 DIAGNOSIS — M25571 Pain in right ankle and joints of right foot: Secondary | ICD-10-CM

## 2018-08-01 DIAGNOSIS — S82851D Displaced trimalleolar fracture of right lower leg, subsequent encounter for closed fracture with routine healing: Secondary | ICD-10-CM

## 2018-08-01 MED ORDER — HYDROCODONE-ACETAMINOPHEN 5-325 MG PO TABS: 1.0000 | ORAL_TABLET | Freq: Four times a day (QID) | ORAL | 0 refills | Status: DC | PRN

## 2018-08-01 NOTE — Progress Notes (Signed)
Office Visit Note   Patient: [name]           Date of Birth: 1955-11-24           MRN: [id number] Visit Date: 08/01/2018              Requested by: Clent Demark, PA-C [place], [place] [address] PCP: Clent Demark, PA-C   Assessment & Plan: Visit Diagnoses:  1. Pain in right ankle and joints of right foot   2. Closed trimalleolar fracture of right ankle with routine healing, subsequent encounter     Plan: Cast is removed from the right lower extremity.  She can begin weightbearing as tolerated and work on range of motion strengthening the ankle.  Have her follow-up with [place] in a month to check her progress.  She will work on range of motion of the ankle.  Weightbearing as tolerated right lower extremity.  She may benefit from physical therapy in the future she defers any therapy at this point time.  Follow-Up Instructions: Return in about 4 weeks (around 08/29/2018).   Orders:  Orders Placed This Encounter  Procedures  . XR Ankle Complete Right   Meds ordered this encounter  Medications  . HYDROcodone-acetaminophen (NORCO) 5-325 MG tablet    Sig: Take 1 tablet by mouth every 6 (six) hours as needed for moderate pain. One to two tabs every 4-6 hours for pain    Dispense:  30 tablet    Refill:  0      Procedures: No procedures performed   Clinical Data: No additional findings.   Subjective: Chief Complaint  Patient presents with  . Right Ankle - Follow-up    HPI [name] returns today follow-up of her right ankle trimalleolar fracture.  She has been in a short leg cast due to the fact that she does not tolerate a cam walker boot.  She is had no new injuries.  She is now 2 and half months status post injury. Review of Systems See HPI  Objective: Vital Signs: There were no vitals taken for this visit.  Physical Exam  Constitutional: She is oriented to person, place, and time. She appears well-developed and well-nourished. No  distress.  Pulmonary/Chest: Effort normal.  Neurological: She is alert and oriented to person, place, and time.  Skin: She is not diaphoretic.    Ortho Exam Right ankle tenderness over the lateral malleolus.  She is able dorsiflex plantarflex the ankle with some limitations of movement in both directions.  Calf supple nontender.  No impending ulcers rashes or skin lesions. Specialty Comments:  No specialty comments available.  Imaging: Xr Ankle Complete Right  Result Date: 08/01/2018 Right ankle 3 views: Talus well located within the ankle mortise.  Fractures appear well-healed.  Bone appears osteopenic.    PMFS History: Patient Active Problem List   Diagnosis Date Noted  . Ankle fracture, bimalleolar, closed, right, initial encounter 05/11/2018  . Depression with anxiety 03/24/2018  . Osteopenia 02/15/2017  . Dyslipidemia with high LDL and low HDL 02/01/2017  . Morbid obesity ([place]) 02/01/2017  . Seborrheic keratoses 01/25/2017  . Filiform wart 01/25/2017  . Vitamin D deficiency 06/19/2016  . Left carpal tunnel syndrome 03/23/2016  . Benign neoplasm of transverse colon   . Benign neoplasm of descending colon   . Benign neoplasm of ascending colon   . Gastroesophageal reflux disease with esophagitis   . Depression 12/24/2015  . Numbness and tingling in left hand  12/24/2015  . History of hepatitis C 08/26/2015  . Elevated hemoglobin A1c 08/26/2015  . Chronic diarrhea 05/28/2015  . Benign neoplasm of colon 02/26/2015  . Varicose veins of both lower extremities with complications 03/50/0938  . Chronic venous insufficiency 01/11/2015  . Hepatic cirrhosis ([place]) 08/07/2014  . Change in bowel habits 07/19/2014  . Essential hypertension, benign 03/19/2014  . Smoking 03/19/2014  . Numerous moles 03/19/2014  . Leg length discrepancy 04/14/2013  . HTN (hypertension) 03/28/2013  . CLAUDICATION 04/29/2009  . Hypothyroidism 04/25/2009  . Thrombocytopenia ([place]) 04/25/2009   Past  Medical History:  Diagnosis Date  . Anemia   . Arthritis   . AVN (avascular necrosis of bone) ([place]) 04/14/2013  . Chronic kidney disease   . Complication of anesthesia   . Decreased mobility    hx. Rt. hip "avascular necrosis" -unable to weight bear long periods, "using wheelchair"..  . Depression   . GERD (gastroesophageal reflux disease)   . Hep B w/o coma   . Hep C w/o coma, chronic ([place])    07-20-14 being presently tx. "Harvoni"-good response per pt. will complete in 7days- Dr. Linus Salmons follows [facility] infectious disease [facility].  . Hypertension   . Hypothyroid    mild- no medication  . Knee stiffness    right knee -hard to bend at knee  . Muscle cramps   . Obesity   . Osteoporosis   . Pneumonia   . PONV (postoperative nausea and vomiting)    only with Demerol  . Shortness of breath dyspnea    still smokes  . Superficial thrombophlebitis    left leg  . Transfusion history    70's, 80's ? hepatitis C attributed to past transfusions    Family History  Problem Relation Age of Onset  . Cancer Mother   . Diabetes Mother   . Heart disease Mother   . Hypertension Mother   . Cancer Father   . Hypertension Sister   . Hypertension Brother   . Diabetes Brother   . Colon cancer Neg Hx     Past Surgical History:  Procedure Laterality Date  . ABDOMINAL HYSTERECTOMY    . APPENDECTOMY     thinks it was removed with hysterectomy  . arthroscopies     knee-right x 7  . CHOLECYSTECTOMY    . COLON SURGERY    . COLONOSCOPY    . COLONOSCOPY WITH PROPOFOL N/A 02/26/2015   Procedure: COLONOSCOPY WITH PROPOFOL;  Surgeon: Inda Castle, MD;  [facility];  Service: Endoscopy;  Laterality: N/A;  . COLONOSCOPY WITH PROPOFOL N/A 03/03/2016   Procedure: COLONOSCOPY WITH PROPOFOL;  Surgeon: Mauri Pole, MD;  [facility];  Service: Endoscopy;  Laterality: N/A;  . ESOPHAGOGASTRODUODENOSCOPY (EGD) WITH PROPOFOL N/A 03/03/2016   Procedure:  ESOPHAGOGASTRODUODENOSCOPY (EGD) WITH PROPOFOL;  Surgeon: Mauri Pole, MD;  [facility];  Service: Endoscopy;  Laterality: N/A;  . JOINT REPLACEMENT     right knee  . TOTAL KNEE ARTHROPLASTY     Social History   Occupational History  . Not on file  Tobacco Use  . Smoking status: Current Every Day Smoker    Packs/day: 0.50    Years: 20.00    Pack years: 10.00    Types: Cigarettes  . Smokeless tobacco: Never Used  . Tobacco comment: trying to cut back  Substance and Sexual Activity  . Alcohol use: No    Alcohol/week: 0.0 standard drinks  . Drug use: No  . Sexual  activity: Not Currently

## 2018-08-15 ENCOUNTER — Encounter (INDEPENDENT_AMBULATORY_CARE_PROVIDER_SITE_OTHER): Payer: Self-pay | Admitting: Physician Assistant

## 2018-08-15 ENCOUNTER — Ambulatory Visit (INDEPENDENT_AMBULATORY_CARE_PROVIDER_SITE_OTHER): Payer: Medicare Other | Admitting: Physician Assistant

## 2018-08-15 ENCOUNTER — Other Ambulatory Visit: Payer: Self-pay

## 2018-08-15 VITALS — BP 115/88 | HR 103 | Temp 97.8°F | Ht 66.0 in

## 2018-08-15 DIAGNOSIS — M8588 Other specified disorders of bone density and structure, other site: Secondary | ICD-10-CM | POA: Diagnosis not present

## 2018-08-15 DIAGNOSIS — M8700 Idiopathic aseptic necrosis of unspecified bone: Secondary | ICD-10-CM | POA: Diagnosis not present

## 2018-08-15 DIAGNOSIS — E785 Hyperlipidemia, unspecified: Secondary | ICD-10-CM

## 2018-08-15 DIAGNOSIS — F418 Other specified anxiety disorders: Secondary | ICD-10-CM

## 2018-08-15 DIAGNOSIS — I10 Essential (primary) hypertension: Secondary | ICD-10-CM | POA: Diagnosis not present

## 2018-08-15 DIAGNOSIS — K219 Gastro-esophageal reflux disease without esophagitis: Secondary | ICD-10-CM | POA: Diagnosis not present

## 2018-08-15 DIAGNOSIS — E039 Hypothyroidism, unspecified: Secondary | ICD-10-CM | POA: Diagnosis not present

## 2018-08-15 DIAGNOSIS — Z23 Encounter for immunization: Secondary | ICD-10-CM | POA: Diagnosis not present

## 2018-08-15 DIAGNOSIS — K529 Noninfective gastroenteritis and colitis, unspecified: Secondary | ICD-10-CM | POA: Diagnosis not present

## 2018-08-15 MED ORDER — LISINOPRIL 40 MG PO TABS: 40.0000 mg | ORAL_TABLET | Freq: Every day | ORAL | 1 refills | Status: DC

## 2018-08-15 MED ORDER — DIPHENOXYLATE-ATROPINE 2.5-0.025 MG PO TABS: ORAL_TABLET | ORAL | 3 refills | Status: DC

## 2018-08-15 MED ORDER — CALTRATE 600+D PLUS MINERALS 600-800 MG-UNIT PO TABS: 2.0000 | ORAL_TABLET | Freq: Every day | ORAL | 3 refills | Status: DC

## 2018-08-15 MED ORDER — DICLOFENAC SODIUM 1 % TD GEL: 2.0000 g | Freq: Four times a day (QID) | TRANSDERMAL | 2 refills | Status: DC | PRN

## 2018-08-15 MED ORDER — OMEPRAZOLE 40 MG PO CPDR: 40.0000 mg | DELAYED_RELEASE_CAPSULE | Freq: Every day | ORAL | 1 refills | Status: DC

## 2018-08-15 MED ORDER — PRAVASTATIN SODIUM 40 MG PO TABS: 40.0000 mg | ORAL_TABLET | Freq: Every day | ORAL | 1 refills | Status: DC

## 2018-08-15 MED ORDER — ESCITALOPRAM OXALATE 10 MG PO TABS: 10.0000 mg | ORAL_TABLET | Freq: Every day | ORAL | 0 refills | Status: DC

## 2018-08-15 MED ORDER — AMLODIPINE BESYLATE 2.5 MG PO TABS: 2.5000 mg | ORAL_TABLET | Freq: Every day | ORAL | 1 refills | Status: DC

## 2018-08-15 MED ORDER — ALENDRONATE SODIUM 70 MG PO TABS: 70.0000 mg | ORAL_TABLET | ORAL | 3 refills | Status: DC

## 2018-08-15 MED ORDER — LEVOTHYROXINE SODIUM 75 MCG PO TABS: 75.0000 ug | ORAL_TABLET | Freq: Every day | ORAL | 5 refills | Status: DC

## 2018-08-15 NOTE — Progress Notes (Signed)
Subjective:  Patient ID: [name], female    DOB: February 18, 1956  Age: 62 y.o. MRN: [id number]  CC: multiple complaints  HPI [name] is a 61 y.o. female with a medical history of anemia, AVN, CKD, depression, HBV, HCV, hepatic cirrhosis, GERD, HTN, PVD, hypothyroidism, prediabetes, morbid obesity, and superficial thrombophlebitis presents to f/u on avascular necrosis, depression with anxiety, and hypothyroidism. Pt was not able to have repair of right AVN due to deterioration of her bones. Upon review of records, patient's bone density performed 02/12/17 which revealed T score 2.4 on femur and 2.1 on spine. She has also had a right ankle fracture recently and says ortho could not operate due to the brittles of her bones. Not currently taking alendronate or Vit D+Ca. Pt feels depressed and reports never having received a call from psychiatry after she was referred. Has stopped taking sertraline due to diarrhea. Asks for a refill of all her medications. Does not endorse any other symptoms.     Outpatient Medications Prior to Visit  Medication Sig Dispense Refill  . amLODipine (NORVASC) 2.5 MG tablet Take 1 tablet (2.5 mg total) by mouth daily. 90 tablet 3  . Calcium Citrate 200 MG TABS Take 2 tablets (400 mg total) by mouth daily. 180 tablet 3  . diclofenac sodium (VOLTAREN) 1 % GEL Apply 2 g topically 4 (four) times daily as needed (pain). 2 Tube 2  . diphenoxylate-atropine (LOMOTIL) 2.5-0.025 MG tablet TAKE 1 TABLET BY MOUTH FOUR TIMES A DAY AS NEEDED FOR DIARRHEA OR LOOSE STOOLS (Patient taking differently: Take 1 tablet by mouth 4 (four) times daily as needed for diarrhea or loose stools. ) 30 tablet 2  . HYDROcodone-acetaminophen (NORCO) 5-325 MG tablet Take 1 tablet by mouth every 6 (six) hours as needed for moderate pain. One to two tabs every 4-6 hours for pain 30 tablet 0  . ibuprofen (ADVIL,MOTRIN) 200 MG tablet Take 200 mg by mouth every 6 (six) hours as needed for headache or  moderate pain.    Marland Kitchen levothyroxine (SYNTHROID, LEVOTHROID) 75 MCG tablet Take 1 tablet (75 mcg total) by mouth daily. 30 tablet 5  . lisinopril (PRINIVIL,ZESTRIL) 40 MG tablet Take 1 tablet (40 mg total) by mouth daily. (Patient taking differently: Take 40 mg by mouth at bedtime. ) 30 tablet 2  . meclizine (ANTIVERT) 12.5 MG tablet Take 1 tablet (12.5 mg total) by mouth 3 (three) times daily as needed for dizziness. 60 tablet 0  . miconazole (MICOTIN) 2 % cream APPLY TOPICALLY AS DIRECTED TWICE A DAY 30 g 3  . omeprazole (PRILOSEC) 40 MG capsule Take 1 capsule (40 mg total) by mouth daily. 90 capsule 3  . ondansetron (ZOFRAN ODT) 4 MG disintegrating tablet Take 1 tablet (4 mg total) by mouth every 8 (eight) hours as needed for nausea or vomiting. 30 tablet 0  . pravastatin (PRAVACHOL) 40 MG tablet Take 1 tablet (40 mg total) by mouth daily. 90 tablet 1  . ranitidine (ZANTAC) 300 MG tablet TAKE 1 TABLET BY MOUTH AT BEDTIME AS NEEDED FOR HEARTBURN (Patient taking differently: Take 300 mg by mouth daily as needed for heartburn. ) 30 tablet 3  . sertraline (ZOLOFT) 50 MG tablet Take 1 tablet (50 mg total) by mouth at bedtime. (Patient taking differently: Take 25 mg by mouth at bedtime. ) 30 tablet 2   No facility-administered medications prior to visit.      ROS Review of Systems  Constitutional: Negative for chills, fever and  malaise/fatigue.  Eyes: Negative for blurred vision.  Respiratory: Negative for shortness of breath.   Cardiovascular: Negative for chest pain and palpitations.  Gastrointestinal: Positive for diarrhea. Negative for abdominal pain and nausea.  Genitourinary: Negative for dysuria and hematuria.  Musculoskeletal: Positive for joint pain. Negative for myalgias.  Skin: Negative for rash.  Neurological: Negative for tingling and headaches.  Psychiatric/Behavioral: Positive for depression. The patient is not nervous/anxious.     Objective:  BP 115/88 (BP Location: Left  Wrist, Patient Position: Sitting, Cuff Size: Normal)   Pulse (!) 103   Temp 97.8 F (36.6 C) (Oral)   Ht 5\' 6"  (1.676 m)   SpO2 95%   BMI 52.73 kg/m   BP/Weight 08/15/2018 05/08/2018 2/39/5320  Systolic BP 233 435 686  Diastolic BP 88 53 77  Wt. (Lbs) - 326.72 -  BMI 52.73 52.73 52.75      Physical Exam  Constitutional: She is oriented to person, place, and time.  Well developed, morbidly obese, NAD, polite  HENT:  Head: Normocephalic and atraumatic.  Eyes: No scleral icterus.  Neck: Normal range of motion. Neck supple. No thyromegaly present.  Cardiovascular: Normal rate, regular rhythm and normal heart sounds.  Pulmonary/Chest: Effort normal and breath sounds normal.  Musculoskeletal: She exhibits no edema.  Neurological: She is alert and oriented to person, place, and time.  Skin: Skin is warm and dry. No rash noted. No erythema. No pallor.  Psychiatric: She has a normal mood and affect. Her behavior is normal. Thought content normal.  Vitals reviewed.    Assessment & Plan:    1. Depression with anxiety - Ambulatory referral to Psychiatry - Begin Lexapro 10 mg.   2. Hypothyroidism, unspecified type - Thyroid Panel With TSH; Future - Comprehensive metabolic panel; Future - Refill levothyroxine (SYNTHROID, LEVOTHROID) 75 MCG tablet; Take 1 tablet (75 mcg total) by mouth daily.  Dispense: 30 tablet; Refill: 5  3. AVN (avascular necrosis of bone) ([place]) - Ortho reportedly could not operate due to bone brittleness. Pt has osteopenia at 2.4 in the femur. I have advised for patient to take alendronate and Vit D + Calcium in an effort to increase her bone mass.   4. Osteopenia of spine - Begin alendronate (FOSAMAX) 70 MG tablet; Take 1 tablet (70 mg total) by mouth once a week. Take with a full glass of water on an empty stomach.  Dispense: 12 tablet; Refill: 3 - Begin Calcium Carbonate-Vit D-Min (CALTRATE 600+D PLUS MINERALS) 600-800 MG-UNIT TABS; Take 2 tablets by mouth  daily.  Dispense: 180 tablet; Refill: 3  5. Dyslipidemia with high LDL and low HDL - Refill pravastatin (PRAVACHOL) 40 MG tablet; Take 1 tablet (40 mg total) by mouth daily.  Dispense: 90 tablet; Refill: 1  6. Gastroesophageal reflux disease, esophagitis presence not specified - Refill omeprazole (PRILOSEC) 40 MG capsule; Take 1 capsule (40 mg total) by mouth daily.  Dispense: 90 capsule; Refill: 1  7. Essential hypertension, benign - Refill lisinopril (PRINIVIL,ZESTRIL) 40 MG tablet; Take 1 tablet (40 mg total) by mouth at bedtime.  Dispense: 90 tablet; Refill: 1 - Refill amLODipine (NORVASC) 2.5 MG tablet; Take 1 tablet (2.5 mg total) by mouth daily.  Dispense: 90 tablet; Refill: 1 - Comprehensive metabolic panel; Future  8. Chronic diarrhea - Refill diphenoxylate-atropine (LOMOTIL) 2.5-0.025 MG tablet; TAKE 1 TABLET BY MOUTH FOUR TIMES A DAY AS NEEDED FOR DIARRHEA OR LOOSE STOOLS  Dispense: 60 tablet; Refill: 3  9. Need for prophylactic vaccination and inoculation against  influenza - Flu Vaccine QUAD 6+ mos PF IM (Fluarix Quad PF)   Meds ordered this encounter  Medications  . alendronate (FOSAMAX) 70 MG tablet    Sig: Take 1 tablet (70 mg total) by mouth once a week. Take with a full glass of water on an empty stomach.    Dispense:  12 tablet    Refill:  3    Order Specific Question:   Supervising Provider    Answer:   Charlott Rakes [[id number]]  . Calcium Carbonate-Vit D-Min (CALTRATE 600+D PLUS MINERALS) 600-800 MG-UNIT TABS    Sig: Take 2 tablets by mouth daily.    Dispense:  180 tablet    Refill:  3    Order Specific Question:   Supervising Provider    Answer:   Charlott Rakes [[id number]]  . escitalopram (LEXAPRO) 10 MG tablet    Sig: Take 1 tablet (10 mg total) by mouth daily.    Dispense:  90 tablet    Refill:  0    Order Specific Question:   Supervising Provider    Answer:   Charlott Rakes [[id number]]  . pravastatin (PRAVACHOL) 40 MG tablet    Sig: Take 1 tablet (40 mg total)  by mouth daily.    Dispense:  90 tablet    Refill:  1    Order Specific Question:   Supervising Provider    Answer:   Charlott Rakes [[id number]]  . omeprazole (PRILOSEC) 40 MG capsule    Sig: Take 1 capsule (40 mg total) by mouth daily.    Dispense:  90 capsule    Refill:  1    Order Specific Question:   Supervising Provider    Answer:   Charlott Rakes [[id number]]  . lisinopril (PRINIVIL,ZESTRIL) 40 MG tablet    Sig: Take 1 tablet (40 mg total) by mouth at bedtime.    Dispense:  90 tablet    Refill:  1    Order Specific Question:   Supervising Provider    Answer:   Charlott Rakes [[id number]]  . diphenoxylate-atropine (LOMOTIL) 2.5-0.025 MG tablet    Sig: TAKE 1 TABLET BY MOUTH FOUR TIMES A DAY AS NEEDED FOR DIARRHEA OR LOOSE STOOLS    Dispense:  60 tablet    Refill:  3    Order Specific Question:   Supervising Provider    Answer:   Charlott Rakes [[id number]]  . diclofenac sodium (VOLTAREN) 1 % GEL    Sig: Apply 2 g topically 4 (four) times daily as needed (pain).    Dispense:  2 Tube    Refill:  2    Order Specific Question:   Supervising Provider    Answer:   Charlott Rakes [[id number]]  . amLODipine (NORVASC) 2.5 MG tablet    Sig: Take 1 tablet (2.5 mg total) by mouth daily.    Dispense:  90 tablet    Refill:  1    Order Specific Question:   Supervising Provider    Answer:   Charlott Rakes [[id number]]  . levothyroxine (SYNTHROID, LEVOTHROID) 75 MCG tablet    Sig: Take 1 tablet (75 mcg total) by mouth daily.    Dispense:  30 tablet    Refill:  5    Order Specific Question:   Supervising Provider    Answer:   Charlott Rakes [[id number]]    Follow-up: Return in about 6 months (around 02/14/2019) for Thyroid and bone density.   Clent Demark PA

## 2018-08-15 NOTE — Patient Instructions (Signed)
Osteoporosis Osteoporosis happens when your bones become thinner and weaker. Weak bones can break (fracture) more easily when you slip or fall. Bones most at risk of breaking are in the hip, wrist, and spine. Follow these instructions at home:  Get enough calcium and vitamin D. These nutrients are good for your bones.  Exercise as told by your doctor.  Do not use any tobacco products. This includes cigarettes, chewing tobacco, and electronic cigarettes. If you need help quitting, ask your doctor.  Limit the amount of alcohol you drink.  Take medicines only as told by your doctor.  Keep all follow-up visits as told by your doctor. This is important.  Take care at home to prevent falls. Some ways to do this are: ? Keep rooms well lit and tidy. ? Put safety rails on your stairs. ? Put a rubber mat in the bathroom and other places that are often wet or slippery. Get help right away if:  You fall.  You hurt yourself. This information is not intended to replace advice given to you by your health care provider. Make sure you discuss any questions you have with your health care provider. Document Released: 11/16/2011 Document Revised: 01/30/2016 Document Reviewed: 02/01/2014 Elsevier Interactive Patient Education  2018 [employer] Inc[employer]  

## 2018-08-18 ENCOUNTER — Other Ambulatory Visit (INDEPENDENT_AMBULATORY_CARE_PROVIDER_SITE_OTHER): Payer: Self-pay | Admitting: Nurse Practitioner

## 2018-08-18 DIAGNOSIS — K529 Noninfective gastroenteritis and colitis, unspecified: Secondary | ICD-10-CM

## 2018-08-24 ENCOUNTER — Encounter (INDEPENDENT_AMBULATORY_CARE_PROVIDER_SITE_OTHER): Payer: Self-pay | Admitting: Physician Assistant

## 2018-08-29 ENCOUNTER — Ambulatory Visit (INDEPENDENT_AMBULATORY_CARE_PROVIDER_SITE_OTHER): Payer: Medicare Other | Admitting: Physician Assistant

## 2018-09-12 ENCOUNTER — Ambulatory Visit (INDEPENDENT_AMBULATORY_CARE_PROVIDER_SITE_OTHER): Payer: Medicare Other | Admitting: Physician Assistant

## 2018-11-01 ENCOUNTER — Ambulatory Visit (INDEPENDENT_AMBULATORY_CARE_PROVIDER_SITE_OTHER): Payer: Medicare Other | Admitting: Physician Assistant

## 2019-01-06 ENCOUNTER — Other Ambulatory Visit: Payer: Self-pay | Admitting: Pharmacist

## 2019-01-06 ENCOUNTER — Other Ambulatory Visit (INDEPENDENT_AMBULATORY_CARE_PROVIDER_SITE_OTHER): Payer: Self-pay | Admitting: Nurse Practitioner

## 2019-01-06 DIAGNOSIS — I10 Essential (primary) hypertension: Secondary | ICD-10-CM

## 2019-01-06 MED ORDER — ESCITALOPRAM OXALATE 10 MG PO TABS: 10.0000 mg | ORAL_TABLET | Freq: Every day | ORAL | 0 refills | Status: DC

## 2019-01-24 ENCOUNTER — Telehealth: Payer: Medicare Other | Admitting: Physician Assistant

## 2019-01-24 DIAGNOSIS — B369 Superficial mycosis, unspecified: Secondary | ICD-10-CM

## 2019-01-24 MED ORDER — CLOTRIMAZOLE-BETAMETHASONE 1-0.05 % EX CREA: 1.0000 "application " | TOPICAL_CREAM | Freq: Two times a day (BID) | CUTANEOUS | 0 refills | Status: DC

## 2019-01-24 MED ORDER — MUPIROCIN 2 % EX OINT: 1.0000 "application " | TOPICAL_OINTMENT | Freq: Two times a day (BID) | CUTANEOUS | 0 refills | Status: DC

## 2019-01-24 NOTE — Progress Notes (Signed)
E Visit for Rash  We are sorry that you are not feeling well. Here is how we plan to help!  Based on what you have shared you like have a fungal infection with a superimposed bacterial infection.  I am going to prescribe an antifungal with a steroid along with a topical prescription antibiotic.  Please apply both twice a day. Follow up with your primary in a 5-6 days if you are not seeing any major improvement.              HOME CARE:  Take cool showers and avoid direct sunlight. Apply cool compress or wet dressings. Take a bath in an oatmeal bath.  Sprinkle content of one Aveeno packet under running faucet with comfortably warm water.  Bathe for 15-20 minutes, 1-2 times daily.  Pat dry with a towel. Do not rub the rash. Use hydrocortisone cream. Take an antihistamine like Benadryl for widespread rashes that itch.  The adult dose of Benadryl is 25-50 mg by mouth 4 times daily. Caution:  This type of medication may cause sleepiness.  Do not drink alcohol, drive, or operate dangerous machinery while taking antihistamines.  Do not take these medications if you have prostate enlargement.  Read package instructions thoroughly on all medications that you take.  GET HELP RIGHT AWAY IF:  Symptoms don't go away after treatment. Severe itching that persists. If you rash spreads or swells. If you rash begins to smell. If it blisters and opens or develops a yellow-brown crust. You develop a fever. You have a sore throat. You become short of breath.  MAKE SURE YOU:  Understand these instructions. Will watch your condition. Will get help right away if you are not doing well or get worse.  Thank you for choosing an e-visit. Your e-visit answers were reviewed by a board certified advanced clinical practitioner to complete your personal care plan. Depending upon the condition, your plan could have included both over the counter or prescription medications. Please review your pharmacy  choice. Be sure that the pharmacy you have chosen is open so that you can pick up your prescription now.  If there is a problem you may message your provider in [place] to have the prescription routed to another pharmacy. Your safety is important to [place]. If you have drug allergies check your prescription carefully.  For the next 24 hours, you can use MyChart to ask questions about today's visit, request a non-urgent call back, or ask for a work or school excuse from your e-visit provider. You will get an email in the next two days asking about your experience. I hope that your e-visit has been valuable and will speed your recovery.      ===View-only below this line===   ----- Message -----    From: [name]    Sent: 01/24/2019  2:49 PM EDT      To: E-Visit Mailing List Subject: E-Visit Submission: Rash  E-Visit Submission: Rash --------------------------------  Question: Where is your rash located? (check all that apply) Answer:   Chest  Question: Is the rash is an area that tends to be moist or sweaty? Answer:   Yes  Question: Rash location "other"/comments: Answer:   yeast infection under breasts have treated for well over a year with athelets foot creme and vaginal yeast products doctor said could buy otc have tried everything reccomended at this point has flared up to almost entire area underneath size 50 breasts anf skin on torso they lay on  I wash and dry 2x a day  Question: Are you able to attach a photo? Please do if possible Answer:   No  Question: Do you have a fever? Answer:   No  Question: Does your rash itch? Answer:   No  Question: Did the rash appear after shaving or other skin irritation? Answer:   No  Question: Is the rash painful? Answer:   Yes  Question: Please rate your pain on a scale of 1-10. 1 being the least and 10 being the worst (range: 1 - 10) Answer:   7  Question: Do you have a red rash on one side of your face or body? Answer:    No  Question: Is it painful or tender to touch? Answer:   Yes  Question: Have you ever had the chicken pox? Answer:   I received the chicken pox vaccine at a young age  Question: Have you ever had Shingles before? Answer:   No  Question: Have you had the Shingles vaccine? Answer:   Yes  Question: If yes, approximately when did you receive the vaccine? Answer:   believe was 2 years ago by [place] provider maybe not for shingles ?  Question: Does your rash appear "wet" or weeping? Answer:   No  Question: When did your symptoms start? Answer:   More than 2 weeks ago  Question: Have you tried any over-the-counter medications? Answer:   Over the counter anti-inflammatory cream            Over the counter oral anti-inflammatories such as ibuprofen, naproxen, etc.  Question: Over-the-counter medications comments Answer:   every athelets foot and vaginal yeast creme on the market  Question: Do you have kidney failure or any kidney insufficiencies? Answer:   No  Question: Please list your medication allergies that you may have ? (If 'none' , please list as 'none') Answer:   penicillin  demerol  A total of 5-10 minutes was spent evaluating this patients questionnaire and formulating a plan of care.

## 2019-02-13 ENCOUNTER — Ambulatory Visit (INDEPENDENT_AMBULATORY_CARE_PROVIDER_SITE_OTHER): Payer: Medicare Other | Admitting: Primary Care

## 2019-02-28 ENCOUNTER — Telehealth: Payer: Self-pay | Admitting: *Deleted

## 2019-02-28 NOTE — Telephone Encounter (Signed)
Per Patient she does not want to schedule hospital colon/egd at this time, Will call back to schedule at a later date

## 2019-03-01 ENCOUNTER — Encounter: Payer: Self-pay | Admitting: Gastroenterology

## 2019-03-16 ENCOUNTER — Other Ambulatory Visit: Payer: Self-pay

## 2019-03-16 ENCOUNTER — Encounter (INDEPENDENT_AMBULATORY_CARE_PROVIDER_SITE_OTHER): Payer: Self-pay | Admitting: Primary Care

## 2019-03-16 ENCOUNTER — Emergency Department (HOSPITAL_BASED_OUTPATIENT_CLINIC_OR_DEPARTMENT_OTHER)
Admission: EM | Admit: 2019-03-16 | Discharge: 2019-03-16 | Disposition: A | Discharge status: Home / Self Care | Payer: Medicare Other | Source: Home / Self Care

## 2019-03-16 ENCOUNTER — Ambulatory Visit (INDEPENDENT_AMBULATORY_CARE_PROVIDER_SITE_OTHER): Payer: Medicare Other | Admitting: Primary Care

## 2019-03-16 ENCOUNTER — Encounter (HOSPITAL_COMMUNITY): Payer: Self-pay | Admitting: *Deleted

## 2019-03-16 ENCOUNTER — Emergency Department (HOSPITAL_COMMUNITY)
Admission: EM | Admit: 2019-03-16 | Discharge: 2019-03-17 | Disposition: A | Discharge status: Home / Self Care | Payer: Medicare Other | Attending: Emergency Medicine | Admitting: Emergency Medicine

## 2019-03-16 DIAGNOSIS — M25551 Pain in right hip: Secondary | ICD-10-CM | POA: Diagnosis not present

## 2019-03-16 DIAGNOSIS — R0602 Shortness of breath: Secondary | ICD-10-CM | POA: Diagnosis not present

## 2019-03-16 DIAGNOSIS — N189 Chronic kidney disease, unspecified: Secondary | ICD-10-CM | POA: Insufficient documentation

## 2019-03-16 DIAGNOSIS — I129 Hypertensive chronic kidney disease with stage 1 through stage 4 chronic kidney disease, or unspecified chronic kidney disease: Secondary | ICD-10-CM | POA: Diagnosis not present

## 2019-03-16 DIAGNOSIS — Z6841 Body Mass Index (BMI) 40.0 and over, adult: Secondary | ICD-10-CM

## 2019-03-16 DIAGNOSIS — Z79899 Other long term (current) drug therapy: Secondary | ICD-10-CM | POA: Diagnosis not present

## 2019-03-16 DIAGNOSIS — R600 Localized edema: Secondary | ICD-10-CM | POA: Insufficient documentation

## 2019-03-16 DIAGNOSIS — I10 Essential (primary) hypertension: Secondary | ICD-10-CM

## 2019-03-16 DIAGNOSIS — M7989 Other specified soft tissue disorders: Secondary | ICD-10-CM

## 2019-03-16 DIAGNOSIS — L03115 Cellulitis of right lower limb: Secondary | ICD-10-CM | POA: Insufficient documentation

## 2019-03-16 DIAGNOSIS — R450 Nervousness: Secondary | ICD-10-CM | POA: Diagnosis not present

## 2019-03-16 DIAGNOSIS — Z96651 Presence of right artificial knee joint: Secondary | ICD-10-CM | POA: Insufficient documentation

## 2019-03-16 DIAGNOSIS — E039 Hypothyroidism, unspecified: Secondary | ICD-10-CM | POA: Insufficient documentation

## 2019-03-16 DIAGNOSIS — Z720 Tobacco use: Secondary | ICD-10-CM | POA: Diagnosis not present

## 2019-03-16 DIAGNOSIS — F17210 Nicotine dependence, cigarettes, uncomplicated: Secondary | ICD-10-CM | POA: Insufficient documentation

## 2019-03-16 DIAGNOSIS — R609 Edema, unspecified: Secondary | ICD-10-CM | POA: Diagnosis not present

## 2019-03-16 DIAGNOSIS — R52 Pain, unspecified: Secondary | ICD-10-CM

## 2019-03-16 DIAGNOSIS — M79604 Pain in right leg: Secondary | ICD-10-CM | POA: Diagnosis present

## 2019-03-16 DIAGNOSIS — R5381 Other malaise: Secondary | ICD-10-CM | POA: Diagnosis not present

## 2019-03-16 MED ORDER — HYDROCODONE-ACETAMINOPHEN 5-325 MG PO TABS: 2.0000 | ORAL_TABLET | ORAL | 0 refills | Status: DC | PRN

## 2019-03-16 MED ORDER — DOXYCYCLINE HYCLATE 100 MG PO CAPS: 100.0000 mg | ORAL_CAPSULE | Freq: Two times a day (BID) | ORAL | 0 refills | Status: DC

## 2019-03-16 MED ORDER — OXYCODONE-ACETAMINOPHEN 5-325 MG PO TABS: 1.0000 | ORAL_TABLET | Freq: Once | ORAL | Status: DC

## 2019-03-16 MED ORDER — HYDROCODONE-ACETAMINOPHEN 5-325 MG PO TABS
1.0000 | ORAL_TABLET | Freq: Once | ORAL | Status: AC
  Administered 2019-03-16: 1 via ORAL
  Filled 2019-03-16: qty 1

## 2019-03-16 MED ORDER — DOXYCYCLINE HYCLATE 100 MG PO TABS
100.0000 mg | ORAL_TABLET | Freq: Once | ORAL | Status: AC
  Administered 2019-03-16: 100 mg via ORAL
  Filled 2019-03-16: qty 1

## 2019-03-16 NOTE — Progress Notes (Signed)
Bilateral lower extremity venous duplex has been completed. Preliminary results can be found in CV Proc through chart review.  Results were given to [facility] PA.  03/16/19 2:41 PM [name] RVT

## 2019-03-16 NOTE — ED Notes (Signed)
Pt back in room and placed in stretcher

## 2019-03-16 NOTE — ED Provider Notes (Signed)
[facility] EMERGENCY DEPARTMENT Provider Note   CSN: [id number] Arrival date & time: 03/16/19  1237    History   Chief Complaint Chief Complaint  Patient presents with  . Leg Injury  . Leg Pain    HPI [name] is a 63 y.o. female.     HPI   63 year old female sent from primary care provider office for evaluation for DVT.  Patient notes she has a history of AVN chronic hip pain and is nonambulatory.  She notes that she is bound to a lift chair.  She notes that she has chronic swelling to the bilateral lower extremities that comes and goes.  She notes that the right is slightly more swollen than the left.  She also notes discoloration on the posterior aspects of both of her lower extremities.  She denies any significant pain in the lower extremity, no fever, no loss of sensation.  She notes that she is chronically shortness of breath as she is a smoker denies any significant change to her respiratory status.  Notes she went to see her primary nurse practitioner who she has never seen before.  She was concerned for DVT in her lower extremities and referred her to the emergency room.   Past Medical History:  Diagnosis Date  . Anemia   . Arthritis   . AVN (avascular necrosis of bone) ([place]) 04/14/2013  . Chronic kidney disease   . Complication of anesthesia   . Decreased mobility    hx. Rt. hip "avascular necrosis" -unable to weight bear long periods, "using wheelchair"..  . Depression   . GERD (gastroesophageal reflux disease)   . Hep B w/o coma   . Hep C w/o coma, chronic ([place])    07-20-14 being presently tx. "Harvoni"-good response per pt. will complete in 7days- Dr. Linus Salmons follows [place] infectious disease control.  . Hypertension   . Hypothyroid    mild- no medication  . Knee stiffness    right knee -hard to bend at knee  . Muscle cramps   . Obesity   . Osteoporosis   . Pneumonia   . PONV (postoperative nausea and vomiting)    only with  Demerol  . Shortness of breath dyspnea    still smokes  . Superficial thrombophlebitis    left leg  . Transfusion history    70's, 80's ? hepatitis C attributed to past transfusions    Patient Active Problem List   Diagnosis Date Noted  . Ankle fracture, bimalleolar, closed, right, initial encounter 05/11/2018  . Depression with anxiety 03/24/2018  . Osteopenia 02/15/2017  . Dyslipidemia with high LDL and low HDL 02/01/2017  . Morbid obesity ([place]) 02/01/2017  . Seborrheic keratoses 01/25/2017  . Filiform wart 01/25/2017  . Vitamin D deficiency 06/19/2016  . Left carpal tunnel syndrome 03/23/2016  . Benign neoplasm of transverse colon   . Benign neoplasm of descending colon   . Benign neoplasm of ascending colon   . Gastroesophageal reflux disease with esophagitis   . Depression 12/24/2015  . Numbness and tingling in left hand 12/24/2015  . History of hepatitis C 08/26/2015  . Elevated hemoglobin A1c 08/26/2015  . Chronic diarrhea 05/28/2015  . Benign neoplasm of colon 02/26/2015  . Varicose veins of both lower extremities with complications 95/28/4132  . Chronic venous insufficiency 01/11/2015  . Hepatic cirrhosis ([place]) 08/07/2014  . Change in bowel habits 07/19/2014  . Essential hypertension, benign 03/19/2014  . Smoking 03/19/2014  . Numerous moles  03/19/2014  . Leg length discrepancy 04/14/2013  . HTN (hypertension) 03/28/2013  . CLAUDICATION 04/29/2009  . Hypothyroidism 04/25/2009  . Thrombocytopenia ([place]) 04/25/2009    Past Surgical History:  Procedure Laterality Date  . ABDOMINAL HYSTERECTOMY    . APPENDECTOMY     thinks it was removed with hysterectomy  . arthroscopies     knee-right x 7  . CHOLECYSTECTOMY    . COLON SURGERY    . COLONOSCOPY    . COLONOSCOPY WITH PROPOFOL N/A 02/26/2015   Procedure: COLONOSCOPY WITH PROPOFOL;  Surgeon: Inda Castle, MD;  Location: [facility];  Service: Endoscopy;  Laterality: N/A;  . COLONOSCOPY WITH PROPOFOL N/A  03/03/2016   Procedure: COLONOSCOPY WITH PROPOFOL;  Surgeon: Mauri Pole, MD;  [facility];  Service: Endoscopy;  Laterality: N/A;  . ESOPHAGOGASTRODUODENOSCOPY (EGD) WITH PROPOFOL N/A 03/03/2016   Procedure: ESOPHAGOGASTRODUODENOSCOPY (EGD) WITH PROPOFOL;  Surgeon: Mauri Pole, MD;  [facility];  Service: Endoscopy;  Laterality: N/A;  . JOINT REPLACEMENT     right knee  . TOTAL KNEE ARTHROPLASTY       OB History   No obstetric history on file.      Home Medications    Prior to Admission medications   Medication Sig Start Date End Date Taking? Authorizing Provider  alendronate (FOSAMAX) 70 MG tablet Take 1 tablet (70 mg total) by mouth once a week. Take with a full glass of water on an empty stomach. 08/15/18  Yes Clent Demark, PA-C  Calcium Carbonate-Vit D-Min (CALTRATE 600+D PLUS MINERALS) 600-800 MG-UNIT TABS Take 2 tablets by mouth daily. 08/15/18  Yes Clent Demark, PA-C  clotrimazole-betamethasone (LOTRISONE) cream Apply 1 application topically 2 (two) times daily. Patient taking differently: Apply 1 application topically 2 (two) times daily as needed (under breast).  01/24/19  Yes Tereasa Coop, PA-C  diclofenac sodium (VOLTAREN) 1 % GEL Apply 2 g topically 4 (four) times daily as needed (pain). 08/15/18  Yes Clent Demark, PA-C  diphenoxylate-atropine (LOMOTIL) 2.5-0.025 MG tablet TAKE 1 TABLET BY MOUTH FOUR TIMES A DAY AS NEEDED FOR DIARRHEA OR LOOSE STOOLS Patient taking differently: Take 1 tablet by mouth 4 (four) times daily as needed for diarrhea or loose stools.  08/15/18  Yes Clent Demark, PA-C  levothyroxine (SYNTHROID, LEVOTHROID) 75 MCG tablet Take 1 tablet (75 mcg total) by mouth daily. 08/15/18  Yes Clent Demark, PA-C  lisinopril (PRINIVIL,ZESTRIL) 40 MG tablet Take 1 tablet (40 mg total) by mouth at bedtime. 08/15/18  Yes Clent Demark, PA-C  mupirocin ointment (BACTROBAN) 2 % Apply 1 application topically  2 (two) times daily. 01/24/19  Yes Tereasa Coop, PA-C  omeprazole (PRILOSEC) 40 MG capsule Take 1 capsule (40 mg total) by mouth daily. Patient taking differently: Take 40 mg by mouth daily as needed (acid reflux).  08/15/18  Yes Clent Demark, PA-C  pravastatin (PRAVACHOL) 40 MG tablet Take 1 tablet (40 mg total) by mouth daily. 08/15/18  Yes Clent Demark, PA-C  amLODipine (NORVASC) 2.5 MG tablet TAKE 1 TABLET (2.5 MG TOTAL) BY MOUTH DAILY. Patient not taking: Reported on 03/17/2019 01/06/19   Kerin Perna, NP  doxycycline (VIBRAMYCIN) 100 MG capsule Take 1 capsule (100 mg total) by mouth 2 (two) times daily. 03/16/19   Gearldean Lomanto, Dellis Filbert, PA-C  escitalopram (LEXAPRO) 10 MG tablet Take 1 tablet (10 mg total) by mouth daily. Needs to keep appt in June for more refills. Patient not taking: Reported on  03/17/2019 01/06/19   Charlott Rakes, MD  HYDROcodone-acetaminophen (NORCO/VICODIN) 5-325 MG tablet Take 2 tablets by mouth every 4 (four) hours as needed. 03/16/19   Okey Regal, PA-C    Family History Family History  Problem Relation Age of Onset  . Cancer Mother   . Diabetes Mother   . Heart disease Mother   . Hypertension Mother   . Cancer Father   . Hypertension Sister   . Hypertension Brother   . Diabetes Brother   . Colon cancer Neg Hx     Social History Social History   Tobacco Use  . Smoking status: Current Every Day Smoker    Packs/day: 0.50    Years: 20.00    Pack years: 10.00    Types: Cigarettes  . Smokeless tobacco: Never Used  . Tobacco comment: trying to cut back  Substance Use Topics  . Alcohol use: No    Alcohol/week: 0.0 standard drinks  . Drug use: No     Allergies   Penicillins, Demerol, and Meperidine   Review of Systems Review of Systems  All other systems reviewed and are negative.   Physical Exam Updated Vital Signs BP 100/69   Pulse 65   Temp 98 F (36.7 C)   Resp 18   SpO2 97%   Physical Exam Vitals signs and nursing  note reviewed.  Constitutional:      Appearance: She is well-developed.  HENT:     Head: Normocephalic and atraumatic.  Eyes:     General: No scleral icterus.       Right eye: No discharge.        Left eye: No discharge.     Conjunctiva/sclera: Conjunctivae normal.     Pupils: Pupils are equal, round, and reactive to light.  Neck:     Musculoskeletal: Normal range of motion.     Vascular: No JVD.     Trachea: No tracheal deviation.  Pulmonary:     Effort: Pulmonary effort is normal.     Breath sounds: No stridor.  Musculoskeletal:     Comments: Right hip with tenderness to palpation pain with all range of motion  Skin:    Comments: Discoloration noted to bilateral posterior lower extremities, right side with slight warmth, no discharge-bilateral lower extremities with normal cap refill and perfusion, sensation intact, right lower extremity slightly larger than left, no pitting edema  Neurological:     Mental Status: She is alert and oriented to person, place, and time.     Coordination: Coordination normal.  Psychiatric:        Behavior: Behavior normal.        Thought Content: Thought content normal.        Judgment: Judgment normal.      ED Treatments / Results  Labs (all labs ordered are listed, but only abnormal results are displayed) Labs Reviewed - No data to display  EKG None  Radiology No results found.  Procedures Procedures (including critical care time)  Medications Ordered in ED Medications  HYDROcodone-acetaminophen (NORCO/VICODIN) 5-325 MG per tablet 1 tablet (1 tablet Oral Given 03/16/19 1352)  HYDROcodone-acetaminophen (NORCO/VICODIN) 5-325 MG per tablet 1 tablet (1 tablet Oral Given 03/16/19 1617)  doxycycline (VIBRA-TABS) tablet 100 mg (100 mg Oral Given 03/16/19 1817)     Initial Impression / Assessment and Plan / ED Course  I have reviewed the triage vital signs and the nursing notes.  Pertinent labs & imaging results that were available during  my care of the patient  were reviewed by me and considered in my medical decision making (see chart for details).         Assessment/Plan: 63 year old female presents today with chronic right hip pain and swelling right lower extremity.  Concern for DVT given her nonambulatory status, her ultrasound showed no deep vein thrombosis.  She does have a small area of discoloration erythema likely secondary to chronic pressure.  She will be covered for cellulitis.  She has chronic right hip pain is unchanged.  She is a very pleasant lady and is having difficulty managing her condition at home.  I do feel that given the fact she cannot ambulate on her own she would benefit from in-home health services.  I discussed with case management and orders placed for face-to-face evaluation.  Patient is discharged home with outpatient follow-up and strict return precautions.  She verbalized understanding and agreement to today's plan had no further questions or concerns at time of discharge.   Final Clinical Impressions(s) / ED Diagnoses   Final diagnoses:  Cellulitis of right lower extremity  Pain of right hip joint  Lower extremity edema    ED Discharge Orders         Ordered    doxycycline (VIBRAMYCIN) 100 MG capsule  2 times daily     03/16/19 1806    HYDROcodone-acetaminophen (NORCO/VICODIN) 5-325 MG tablet  Every 4 hours PRN     03/16/19 1806           Okey Regal, PA-C 03/20/19 1332    Tegeler, Gwenyth Allegra, MD 03/21/19 0023

## 2019-03-16 NOTE — ED Triage Notes (Addendum)
To ED via [facility] from md office for eval of right leg swelling and bruising. No injury. No sob. Pt states she has been confined to her lift chair and unable to lay in her bed. Hx of 'blood clots but not deep vein'.

## 2019-03-16 NOTE — Discharge Instructions (Addendum)
Please read attached information. If you experience any new or worsening signs or symptoms please return to the emergency room for evaluation. Please follow-up with your primary care provider or specialist as discussed. Please use medication prescribed only as directed and discontinue taking if you have any concerning signs or symptoms.   °

## 2019-03-16 NOTE — ED Notes (Signed)
Secretary to contact [facility] on approx time of arrival to take patient home

## 2019-03-16 NOTE — Discharge Planning (Signed)
EDCM received call from Carlis Abbott, [place] at [facility] regarding need to transport pt power wheelchair from [place] to pt home.  EDCM will continue to follow as arrangements have been arranged.

## 2019-03-16 NOTE — ED Notes (Signed)
Pt reports she must go home via ambulance.

## 2019-03-16 NOTE — Progress Notes (Signed)
Established Patient Office Visit  Subjective:  Patient ID: [name], female    DOB: 12/13/1955  Age: 63 y.o. MRN: [id number]  CC:  Chief Complaint  Patient presents with  . multiple issues    yeast under breast, pain in back of legs    HPI [name] presents for concerns with discoloration of bilateral leg edema right leg is larger  than the left pictures imported. Bilateral tenderness, discoloration and pain. She is short of breath at rest very tearful and crying there transportation break down at this appointment unclear what her husband will do to get home. She continues to smoke would not quantify but said too much when I enter the exam room overwhelm with cigarette odor.   Past Medical History:  Diagnosis Date  . Anemia   . Arthritis   . AVN (avascular necrosis of bone) ([place]) 04/14/2013  . Chronic kidney disease   . Complication of anesthesia   . Decreased mobility    hx. Rt. hip "avascular necrosis" -unable to weight bear long periods, "using wheelchair"..  . Depression   . GERD (gastroesophageal reflux disease)   . Hep B w/o coma   . Hep C w/o coma, chronic ([place])    07-20-14 being presently tx. "Harvoni"-good response per pt. will complete in 7days- Dr. Linus Salmons follows [place] infectious disease control.  . Hypertension   . Hypothyroid    mild- no medication  . Knee stiffness    right knee -hard to bend at knee  . Muscle cramps   . Obesity   . Osteoporosis   . Pneumonia   . PONV (postoperative nausea and vomiting)    only with Demerol  . Shortness of breath dyspnea    still smokes  . Superficial thrombophlebitis    left leg  . Transfusion history    70's, 80's ? hepatitis C attributed to past transfusions    Past Surgical History:  Procedure Laterality Date  . ABDOMINAL HYSTERECTOMY    . APPENDECTOMY     thinks it was removed with hysterectomy  . arthroscopies     knee-right x 7  . CHOLECYSTECTOMY    . COLON SURGERY    . COLONOSCOPY    .  COLONOSCOPY WITH PROPOFOL N/A 02/26/2015   Procedure: COLONOSCOPY WITH PROPOFOL;  Surgeon: Inda Castle, MD;  [facility]: [facility];  Service: Endoscopy;  Laterality: N/A;  . COLONOSCOPY WITH PROPOFOL N/A 03/03/2016   Procedure: COLONOSCOPY WITH PROPOFOL;  Surgeon: Mauri Pole, MD;  [facility];  Service: Endoscopy;  Laterality: N/A;  . ESOPHAGOGASTRODUODENOSCOPY (EGD) WITH PROPOFOL N/A 03/03/2016   Procedure: ESOPHAGOGASTRODUODENOSCOPY (EGD) WITH PROPOFOL;  Surgeon: Mauri Pole, MD;  [facility];  Service: Endoscopy;  Laterality: N/A;  . JOINT REPLACEMENT     right knee  . TOTAL KNEE ARTHROPLASTY      Family History  Problem Relation Age of Onset  . Cancer Mother   . Diabetes Mother   . Heart disease Mother   . Hypertension Mother   . Cancer Father   . Hypertension Sister   . Hypertension Brother   . Diabetes Brother   . Colon cancer Neg Hx     Social History   Socioeconomic History  . Marital status: Divorced    Spouse name: Not on file  . Number of children: Not on file  . Years of education: Not on file  . Highest education level: Not on file  Occupational History  . Not  on file  Social Needs  . Financial resource strain: Not on file  . Food insecurity    Worry: Not on file    Inability: Not on file  . Transportation needs    Medical: Not on file    Non-medical: Not on file  Tobacco Use  . Smoking status: Current Every Day Smoker    Packs/day: 0.50    Years: 20.00    Pack years: 10.00    Types: Cigarettes  . Smokeless tobacco: Never Used  . Tobacco comment: trying to cut back  Substance and Sexual Activity  . Alcohol use: No    Alcohol/week: 0.0 standard drinks  . Drug use: No  . Sexual activity: Not Currently  Lifestyle  . Physical activity    Days per week: Not on file    Minutes per session: Not on file  . Stress: Not on file  Relationships  . Social Herbalist on phone: Not on file    Gets together:  Not on file    Attends religious service: Not on file    Active member of club or organization: Not on file    Attends meetings of clubs or organizations: Not on file    Relationship status: Not on file  . Intimate partner violence    Fear of current or ex partner: Not on file    Emotionally abused: Not on file    Physically abused: Not on file    Forced sexual activity: Not on file  Other Topics Concern  . Not on file  Social History Narrative  . Not on file    Outpatient Medications Prior to Visit  Medication Sig Dispense Refill  . alendronate (FOSAMAX) 70 MG tablet Take 1 tablet (70 mg total) by mouth once a week. Take with a full glass of water on an empty stomach. 12 tablet 3  . amLODipine (NORVASC) 2.5 MG tablet TAKE 1 TABLET (2.5 MG TOTAL) BY MOUTH DAILY. 30 tablet 1  . Calcium Carbonate-Vit D-Min (CALTRATE 600+D PLUS MINERALS) 600-800 MG-UNIT TABS Take 2 tablets by mouth daily. 180 tablet 3  . clotrimazole-betamethasone (LOTRISONE) cream Apply 1 application topically 2 (two) times daily. 30 g 0  . diclofenac sodium (VOLTAREN) 1 % GEL Apply 2 g topically 4 (four) times daily as needed (pain). 2 Tube 2  . diphenoxylate-atropine (LOMOTIL) 2.5-0.025 MG tablet TAKE 1 TABLET BY MOUTH FOUR TIMES A DAY AS NEEDED FOR DIARRHEA OR LOOSE STOOLS 60 tablet 3  . escitalopram (LEXAPRO) 10 MG tablet Take 1 tablet (10 mg total) by mouth daily. Needs to keep appt in June for more refills. 90 tablet 0  . HYDROcodone-acetaminophen (NORCO) 5-325 MG tablet Take 1 tablet by mouth every 6 (six) hours as needed for moderate pain. One to two tabs every 4-6 hours for pain 30 tablet 0  . levothyroxine (SYNTHROID, LEVOTHROID) 75 MCG tablet Take 1 tablet (75 mcg total) by mouth daily. 30 tablet 5  . lisinopril (PRINIVIL,ZESTRIL) 40 MG tablet Take 1 tablet (40 mg total) by mouth at bedtime. 90 tablet 1  . mupirocin ointment (BACTROBAN) 2 % Apply 1 application topically 2 (two) times daily. 22 g 0  . omeprazole  (PRILOSEC) 40 MG capsule Take 1 capsule (40 mg total) by mouth daily. 90 capsule 1  . pravastatin (PRAVACHOL) 40 MG tablet Take 1 tablet (40 mg total) by mouth daily. 90 tablet 1   No facility-administered medications prior to visit.     Allergies  Allergen  Reactions  . Penicillins Anaphylaxis and Other (See Comments)    Has patient had a PCN reaction causing immediate rash, facial/tongue/throat swelling, SOB or lightheadedness with hypotension: Yes Has patient had a PCN reaction causing severe rash involving mucus membranes or skin necrosis: No Has patient had a PCN reaction that required hospitalization: Yes Has patient had a PCN reaction occurring within the last 10 years: No If all of the above answers are "NO", then may proceed with Cephalosporin use.   . Demerol Nausea And Vomiting  . Meperidine Nausea And Vomiting    ROS Review of Systems  Constitutional: Positive for activity change and fatigue.  HENT: Positive for dental problem.   Respiratory: Positive for shortness of breath.   Cardiovascular: Positive for leg swelling.  Gastrointestinal: Positive for abdominal distention.  Musculoskeletal: Positive for gait problem and joint swelling.  Skin: Positive for color change.  Psychiatric/Behavioral:       Depression   All other systems reviewed and are negative.     Objective:    Physical Exam  Constitutional: She is oriented to person, place, and time. She appears well-developed and well-nourished.  Morbid obesity   Neck: Neck supple.  Cardiovascular: Regular rhythm.  Abdominal: Soft. Bowel sounds are normal. She exhibits distension.              Musculoskeletal: Normal range of motion.        General: Deformity and edema present.     Comments: Hip   Neurological: She is oriented to person, place, and time.  Skin: Skin is warm and dry. Rash noted. There is erythema.  Psychiatric: She has a normal mood and affect.    BP (!) 142/89 (BP Location: Left  Wrist, Patient Position: Sitting)   Pulse 92   Temp 98.2 F (36.8 C) (Tympanic)   Ht 5\' 6"  (1.676 m)   SpO2 96%   BMI 52.73 kg/m  Wt Readings from Last 3 Encounters:  05/08/18 (!) 326 lb 11.6 oz (148.2 kg)  12/23/17 (!) 326 lb 12.8 oz (148.2 kg)  09/06/17 (!) 326 lb 12.8 oz (148.2 kg)     Health Maintenance Due  Topic Date Due  . MAMMOGRAM  02/13/2019    There are no preventive care reminders to display for this patient.  Lab Results  Component Value Date   TSH 3.030 03/24/2018   Lab Results  Component Value Date   WBC 8.1 06/18/2016   HGB 13.1 06/18/2016   HCT 39.3 06/18/2016   MCV 90.1 06/18/2016   PLT 162 06/18/2016   Lab Results  Component Value Date   NA 140 03/24/2018   K 4.0 03/24/2018   CO2 25 03/24/2018   GLUCOSE 133 (H) 03/24/2018   BUN 13 03/24/2018   CREATININE 0.70 03/24/2018   BILITOT 0.3 03/24/2018   ALKPHOS 62 03/24/2018   AST 11 03/24/2018   ALT 6 03/24/2018   PROT 7.3 03/24/2018   ALBUMIN 3.9 03/24/2018   CALCIUM 9.1 03/24/2018   GFR 114.77 05/28/2015   Lab Results  Component Value Date   CHOL 121 09/06/2017   Lab Results  Component Value Date   HDL 40 09/06/2017   Lab Results  Component Value Date   LDLCALC 58 09/06/2017   Lab Results  Component Value Date   TRIG 113 09/06/2017   Lab Results  Component Value Date   CHOLHDL 3.0 09/06/2017   Lab Results  Component Value Date   HGBA1C 5.6 03/24/2018      Assessment &  Plan:   Problem List Items Addressed This Visit    HTN (hypertension) (Chronic)   Morbid obesity ([place]) - Primary    Other Visit Diagnoses    Bilateral lower extremity edema       Tobacco abuse          No orders of the defined types were placed in this encounter. Misk was seen today for multiple issues.  Diagnoses and all orders for this visit:  Morbid obesity ([place]) sedentary life style unable to self transfer. BMI >50.  Healthy lifestyle diet of fruits, vegetables ,fish, nuts whole grains and  low saturated fat . Count carbohydrate  Essential hypertension  Counseled on blood pressure goal of less than 130/80, low-sodium, DASH diet, medication compliance, 150 minutes of moderate intensity exercise per week. Discussed medication compliance, adverse effects.  Bilateral lower extremity edema Probable cellulitis however the right leg is warm, edema, and painful r/o DVT sent to emergency via EMS. Right hip (AVN) and foot is prone right .   Tobacco abuse Discussed the adverse affects on every organ in her body increase risk for lung cancer and respiratory problems.   Follow-up: No follow-ups on file.    Kerin Perna, NP

## 2019-03-16 NOTE — ED Notes (Signed)
Patient stating her brother in law is going to come pick her up since ptar is taking so long. Patient verbalized understanding of dc instructions, vss.

## 2019-03-16 NOTE — ED Notes (Addendum)
Note made in error

## 2019-03-16 NOTE — ED Notes (Signed)
[facility] called  

## 2019-03-16 NOTE — ED Notes (Signed)
Pt taken to restroom via wheelchair.

## 2019-03-17 ENCOUNTER — Other Ambulatory Visit: Payer: Self-pay

## 2019-03-17 DIAGNOSIS — Z743 Need for continuous supervision: Secondary | ICD-10-CM | POA: Diagnosis not present

## 2019-03-17 DIAGNOSIS — R279 Unspecified lack of coordination: Secondary | ICD-10-CM | POA: Diagnosis not present

## 2019-03-17 NOTE — Patient Outreach (Signed)
[facility]) Care Management  03/17/2019   SHONIA SKILLING 03-02-1956 [id number]  Subjective:  Today's Vitals   03/17/19 1237 03/17/19 1239  Weight: (!) 331 lb (150.1 kg)   Height: 1.676 m ('5\' 6"' )   PainSc:  4    Objective: 63 year old female  Current Medications:  Current Outpatient Medications  Medication Sig Dispense Refill  . alendronate (FOSAMAX) 70 MG tablet Take 1 tablet (70 mg total) by mouth once a week. Take with a full glass of water on an empty stomach. 12 tablet 3  . Calcium Carbonate-Vit D-Min (CALTRATE 600+D PLUS MINERALS) 600-800 MG-UNIT TABS Take 2 tablets by mouth daily. 180 tablet 3  . clotrimazole-betamethasone (LOTRISONE) cream Apply 1 application topically 2 (two) times daily. (Patient taking differently: Apply 1 application topically 2 (two) times daily as needed (under breast). ) 30 g 0  . diclofenac sodium (VOLTAREN) 1 % GEL Apply 2 g topically 4 (four) times daily as needed (pain). 2 Tube 2  . diphenoxylate-atropine (LOMOTIL) 2.5-0.025 MG tablet TAKE 1 TABLET BY MOUTH FOUR TIMES A DAY AS NEEDED FOR DIARRHEA OR LOOSE STOOLS (Patient taking differently: Take 1 tablet by mouth 4 (four) times daily as needed for diarrhea or loose stools. ) 60 tablet 3  . doxycycline (VIBRAMYCIN) 100 MG capsule Take 1 capsule (100 mg total) by mouth 2 (two) times daily. 20 capsule 0  . HYDROcodone-acetaminophen (NORCO/VICODIN) 5-325 MG tablet Take 2 tablets by mouth every 4 (four) hours as needed. 10 tablet 0  . levothyroxine (SYNTHROID, LEVOTHROID) 75 MCG tablet Take 1 tablet (75 mcg total) by mouth daily. 30 tablet 5  . lisinopril (PRINIVIL,ZESTRIL) 40 MG tablet Take 1 tablet (40 mg total) by mouth at bedtime. 90 tablet 1  . mupirocin ointment (BACTROBAN) 2 % Apply 1 application topically 2 (two) times daily. 22 g 0  . omeprazole (PRILOSEC) 40 MG capsule Take 1 capsule (40 mg total) by mouth daily. (Patient taking differently: Take 40 mg by mouth daily as needed  (acid reflux). ) 90 capsule 1  . pravastatin (PRAVACHOL) 40 MG tablet Take 1 tablet (40 mg total) by mouth daily. 90 tablet 1  . amLODipine (NORVASC) 2.5 MG tablet TAKE 1 TABLET (2.5 MG TOTAL) BY MOUTH DAILY. (Patient not taking: Reported on 03/17/2019) 30 tablet 1  . escitalopram (LEXAPRO) 10 MG tablet Take 1 tablet (10 mg total) by mouth daily. Needs to keep appt in June for more refills. (Patient not taking: Reported on 03/17/2019) 90 tablet 0   No current facility-administered medications for this visit.     Functional Status:  In your present state of health, do you have any difficulty performing the following activities: 03/17/2019  Hearing? N  Vision? N  Difficulty concentrating or making decisions? N  Walking or climbing stairs? Y  Comment able to stand only. uses w/c at home. HH PT ordered  Dressing or bathing? Y  Comment member's husband Liberty Handy assists  Doing errands, shopping? Y  Comment member's husband Liberty Handy assists  Conservation officer, nature and eating ? Y  Comment member's significant other Liberty Handy assists  Using the Toilet? Y  Comment member's significant other Liberty Handy assists  In the past six months, have you accidently leaked urine? Y  Comment member's significant other Liberty Handy assists  Do you have problems with loss of bowel control? Y  Comment member's significant other Liberty Handy assists  Managing your Medications? Y  Comment member's significant other Liberty Handy assists  Managing your Finances? Y  Comment member's significant other Liberty Handy assists  Housekeeping or managing your Housekeeping? Y  Comment member's significant other Liberty Handy assists  Some recent data might be hidden    Fall/Depression Screening: Fall Risk  03/17/2019 03/16/2019 08/15/2018  Falls in the past year? '1 1 1  ' Number falls in past yr: 1 0 1  Injury with Fall? '1 1 1  ' Risk for fall due to : History of fall(s);Impaired mobility;Impaired balance/gait;Medication side effect Impaired  mobility -  Risk for fall due to: Comment one leg shorter than the other - -  Follow up Falls evaluation completed;Education provided;Falls prevention discussed - -  Comment home health PT ordered - -   PHQ 2/9 Scores 03/17/2019 03/16/2019 03/16/2019 08/15/2018 03/24/2018 12/23/2017 09/06/2017  PHQ - 2 Score 3 4 0 '4 2 3 2  ' PHQ- 9 Score 10 13 - '13 7 8 5    ' THN CM Care Plan Problem One     Most Recent Value  Care Plan Problem One  Altered nutrition: more than body requirements related to use of food as coping mechanism per member's verbal report  Role Documenting the Problem One  Care Management [place] for Problem One  Active  THN Long Term Goal   Member will verbalize measures necessary to achieve weight reduction in next 90 days  THN Long Term Goal Start Date  03/17/19  [facility] Long Term Goal Met Date  03/17/19  Interventions for Problem One Long Term Goal  Assessed member's readiness and willingness to participate in weight reduction education  THN CM Short Term Goal #1   Member will weigh self 1-2 times a week and document weight for next 30 days  THN CM Short Term Goal #1 Start Date  03/17/19  Interventions for Short Term Goal #1  Assessed member for knowledge of current weight,  will mail bariatric scale to member's home  THN CM Short Term Goal #2   member will document meals eaten daily in [facility] calendar for next 30 days  THN CM Short Term Goal #2 Start Date  03/17/19  Interventions for Short Term Goal #2  will mail [facility] calendar to [facility] home      Assessment: Received referral on today from [place] requesting RN CM to follow up with member due to DM and CHF. Called member at preferred number and member answered phone. Introduced self and explained reason for the call. HIPPA identifiers verified.  Explained that [facility] care coordination services, connection to local community resources and personal assistance in managing member's healthcare needs are provided to member without  cost. Additional benefits explained as well. Member verbalized acceptance of services; however, denies health conditions of DM and CHF.   Member with no hospitalizations in last 6 months. ED visit yesterday due to c/o LLL edema and R hip pain. [place]; home health Services RN, Physical Therpay, Occupatioal Therapy ordered and member states she received call from home health today. Member states understanding of discharge instructions and denies need for assistance.   Member states she is divorced and states that [name][employer] significant other (who member refers to as husband) lives with her and [name] assists with ADLs and IADLs. Member states she pays own bills; however, [name] will mail money orders. Member states she has impaired mobility with one leg longer than the other and is able to stand but not walk, uses w/c.  Member verbalized preference for RN CM to assist with  weight reduction as member reports last weight 313 lbs. Member stated "my weight is the underlying factor of my limitations and affects how I feel about myself". Member admits to cigarette smoking, depression (refuses to take Lexapro medication as ordered and education provided), impaired balance and states that all will improve once weight reduction is achieved. Member stated that she was ambulatory and was employed until 6 years ago and states this time frame is the beginning to depression and weight increase. Member states weight reduction goal 5 lbs or > weight loss/ month.  Fall: member reports falls x 2 in last year with "cracked rib" injury that required Provider visit.   Medications: Member states medications are delivered to her home and denies medication assistance.  Transportation: Member states she has missed multiple appointments due to lack of transportation and states she a vehicle Training and development officer) that needs repairing as the w/c lift has broken.  Depression: member states preference to read a book, talk to [place];  play games on computer verus taking Lexapro medication. Encouraged member to make PCP aware of non-compliance with medication and member verbalized agreement.  Meals: Member states she has hard time buying food. Member denies specific diet; however, states she does not add salt to her meals.   COVID-19: educated member on importance of maintaining compliance with restrictions and member states compliance with wearing face mask, washing hands, physical distancing; however, also states she is home most times.  Plan:  Will send bariatric scale to member's home. Will send SW referral to assist with transportation and meals. Will send Welcome packet, successful outreach letter with nurse advice line magnet (member educated), [facility] calendar. Will send physician barrier letter, copy of assessment; care plan, fall risk; depression risk. Will follow up with member next month.  Benjamine Mola "ANN" Josiah Lobo, RN-BSN  [facility] Care Management  Community Care Management Coordinator  [phone] [email or web address]

## 2019-03-19 DIAGNOSIS — B182 Chronic viral hepatitis C: Secondary | ICD-10-CM | POA: Diagnosis not present

## 2019-03-19 DIAGNOSIS — I129 Hypertensive chronic kidney disease with stage 1 through stage 4 chronic kidney disease, or unspecified chronic kidney disease: Secondary | ICD-10-CM | POA: Diagnosis not present

## 2019-03-19 DIAGNOSIS — B3789 Other sites of candidiasis: Secondary | ICD-10-CM | POA: Diagnosis not present

## 2019-03-19 DIAGNOSIS — Z8701 Personal history of pneumonia (recurrent): Secondary | ICD-10-CM | POA: Diagnosis not present

## 2019-03-19 DIAGNOSIS — Z6841 Body Mass Index (BMI) 40.0 and over, adult: Secondary | ICD-10-CM | POA: Diagnosis not present

## 2019-03-19 DIAGNOSIS — L03115 Cellulitis of right lower limb: Secondary | ICD-10-CM | POA: Diagnosis not present

## 2019-03-19 DIAGNOSIS — K219 Gastro-esophageal reflux disease without esophagitis: Secondary | ICD-10-CM | POA: Diagnosis not present

## 2019-03-19 DIAGNOSIS — M1990 Unspecified osteoarthritis, unspecified site: Secondary | ICD-10-CM | POA: Diagnosis not present

## 2019-03-19 DIAGNOSIS — N185 Chronic kidney disease, stage 5: Secondary | ICD-10-CM | POA: Diagnosis not present

## 2019-03-19 DIAGNOSIS — F329 Major depressive disorder, single episode, unspecified: Secondary | ICD-10-CM | POA: Diagnosis not present

## 2019-03-19 DIAGNOSIS — M810 Age-related osteoporosis without current pathological fracture: Secondary | ICD-10-CM | POA: Diagnosis not present

## 2019-03-19 DIAGNOSIS — E039 Hypothyroidism, unspecified: Secondary | ICD-10-CM | POA: Diagnosis not present

## 2019-03-19 DIAGNOSIS — Z90710 Acquired absence of both cervix and uterus: Secondary | ICD-10-CM | POA: Diagnosis not present

## 2019-03-19 DIAGNOSIS — F17210 Nicotine dependence, cigarettes, uncomplicated: Secondary | ICD-10-CM | POA: Diagnosis not present

## 2019-03-19 DIAGNOSIS — Z96698 Presence of other orthopedic joint implants: Secondary | ICD-10-CM | POA: Diagnosis not present

## 2019-03-19 DIAGNOSIS — M87051 Idiopathic aseptic necrosis of right femur: Secondary | ICD-10-CM | POA: Diagnosis not present

## 2019-03-19 DIAGNOSIS — Z9049 Acquired absence of other specified parts of digestive tract: Secondary | ICD-10-CM | POA: Diagnosis not present

## 2019-03-19 DIAGNOSIS — D631 Anemia in chronic kidney disease: Secondary | ICD-10-CM | POA: Diagnosis not present

## 2019-03-19 DIAGNOSIS — B181 Chronic viral hepatitis B without delta-agent: Secondary | ICD-10-CM | POA: Diagnosis not present

## 2019-03-20 ENCOUNTER — Ambulatory Visit: Payer: Medicare Other

## 2019-03-21 DIAGNOSIS — N185 Chronic kidney disease, stage 5: Secondary | ICD-10-CM | POA: Diagnosis not present

## 2019-03-21 DIAGNOSIS — L03115 Cellulitis of right lower limb: Secondary | ICD-10-CM | POA: Diagnosis not present

## 2019-03-21 DIAGNOSIS — M1990 Unspecified osteoarthritis, unspecified site: Secondary | ICD-10-CM | POA: Diagnosis not present

## 2019-03-21 DIAGNOSIS — M87051 Idiopathic aseptic necrosis of right femur: Secondary | ICD-10-CM | POA: Diagnosis not present

## 2019-03-21 DIAGNOSIS — I129 Hypertensive chronic kidney disease with stage 1 through stage 4 chronic kidney disease, or unspecified chronic kidney disease: Secondary | ICD-10-CM | POA: Diagnosis not present

## 2019-03-22 ENCOUNTER — Other Ambulatory Visit: Payer: Self-pay

## 2019-03-22 ENCOUNTER — Telehealth: Payer: Self-pay

## 2019-03-22 NOTE — Telephone Encounter (Signed)
Called and left message asking laura to return call to [facility] at [phone]. Nat Christen, CMA

## 2019-03-22 NOTE — Patient Outreach (Signed)
[facility]) Care Management  03/22/2019  [name] 11/06/1955 [id number]   Successful outreach regarding social work referral for transportation and food resources.  Patient previously used [employer] with wheelchair lift, however, it recently broke down.  BSW and patient discussed SCAT services.  SCAT application completed via phone and submitted to eligibility.   Patient reported sometimes utilizing food pantry at [employer].  States that she is not familiar with other pantries in the area.  BSW will mail [employer] which list all pantries in the area.  BSW and patient discussed applying for food stamps as well as Medicaid.  Patient has previously applied for both and did not qualify.  BSW will follow up within the next three weeks regarding status of SCAT application and to ensure receipt of food resources.  Ronn Melena, BSW Social Worker [phone]

## 2019-03-22 NOTE — Telephone Encounter (Signed)
[name] from [facility] contacted the office and is wanting verbal orders for PT twice a week for 6 weeks and once a week for 2 weeks. Please f/u

## 2019-03-23 DIAGNOSIS — N185 Chronic kidney disease, stage 5: Secondary | ICD-10-CM | POA: Diagnosis not present

## 2019-03-23 DIAGNOSIS — L03115 Cellulitis of right lower limb: Secondary | ICD-10-CM | POA: Diagnosis not present

## 2019-03-23 DIAGNOSIS — M1990 Unspecified osteoarthritis, unspecified site: Secondary | ICD-10-CM | POA: Diagnosis not present

## 2019-03-23 DIAGNOSIS — I129 Hypertensive chronic kidney disease with stage 1 through stage 4 chronic kidney disease, or unspecified chronic kidney disease: Secondary | ICD-10-CM | POA: Diagnosis not present

## 2019-03-23 DIAGNOSIS — M87051 Idiopathic aseptic necrosis of right femur: Secondary | ICD-10-CM | POA: Diagnosis not present

## 2019-03-23 NOTE — Telephone Encounter (Signed)
Called and spoke with [name] and gave verbal for PT twice a week for 6 weeks and once a week for 2 weeks. Also provided verbal for OT evaluation. Nat Christen, CMA

## 2019-03-30 DIAGNOSIS — M87051 Idiopathic aseptic necrosis of right femur: Secondary | ICD-10-CM | POA: Diagnosis not present

## 2019-03-30 DIAGNOSIS — N185 Chronic kidney disease, stage 5: Secondary | ICD-10-CM | POA: Diagnosis not present

## 2019-03-30 DIAGNOSIS — M1990 Unspecified osteoarthritis, unspecified site: Secondary | ICD-10-CM | POA: Diagnosis not present

## 2019-03-30 DIAGNOSIS — L03115 Cellulitis of right lower limb: Secondary | ICD-10-CM | POA: Diagnosis not present

## 2019-03-30 DIAGNOSIS — I129 Hypertensive chronic kidney disease with stage 1 through stage 4 chronic kidney disease, or unspecified chronic kidney disease: Secondary | ICD-10-CM | POA: Diagnosis not present

## 2019-04-04 DIAGNOSIS — I129 Hypertensive chronic kidney disease with stage 1 through stage 4 chronic kidney disease, or unspecified chronic kidney disease: Secondary | ICD-10-CM | POA: Diagnosis not present

## 2019-04-04 DIAGNOSIS — M1990 Unspecified osteoarthritis, unspecified site: Secondary | ICD-10-CM | POA: Diagnosis not present

## 2019-04-04 DIAGNOSIS — L03115 Cellulitis of right lower limb: Secondary | ICD-10-CM | POA: Diagnosis not present

## 2019-04-04 DIAGNOSIS — N185 Chronic kidney disease, stage 5: Secondary | ICD-10-CM | POA: Diagnosis not present

## 2019-04-04 DIAGNOSIS — M87051 Idiopathic aseptic necrosis of right femur: Secondary | ICD-10-CM | POA: Diagnosis not present

## 2019-04-06 DIAGNOSIS — M1990 Unspecified osteoarthritis, unspecified site: Secondary | ICD-10-CM | POA: Diagnosis not present

## 2019-04-06 DIAGNOSIS — I129 Hypertensive chronic kidney disease with stage 1 through stage 4 chronic kidney disease, or unspecified chronic kidney disease: Secondary | ICD-10-CM | POA: Diagnosis not present

## 2019-04-06 DIAGNOSIS — N185 Chronic kidney disease, stage 5: Secondary | ICD-10-CM | POA: Diagnosis not present

## 2019-04-06 DIAGNOSIS — L03115 Cellulitis of right lower limb: Secondary | ICD-10-CM | POA: Diagnosis not present

## 2019-04-06 DIAGNOSIS — M87051 Idiopathic aseptic necrosis of right femur: Secondary | ICD-10-CM | POA: Diagnosis not present

## 2019-04-11 DIAGNOSIS — M1990 Unspecified osteoarthritis, unspecified site: Secondary | ICD-10-CM | POA: Diagnosis not present

## 2019-04-11 DIAGNOSIS — N185 Chronic kidney disease, stage 5: Secondary | ICD-10-CM | POA: Diagnosis not present

## 2019-04-11 DIAGNOSIS — L03115 Cellulitis of right lower limb: Secondary | ICD-10-CM | POA: Diagnosis not present

## 2019-04-11 DIAGNOSIS — M87051 Idiopathic aseptic necrosis of right femur: Secondary | ICD-10-CM | POA: Diagnosis not present

## 2019-04-11 DIAGNOSIS — I129 Hypertensive chronic kidney disease with stage 1 through stage 4 chronic kidney disease, or unspecified chronic kidney disease: Secondary | ICD-10-CM | POA: Diagnosis not present

## 2019-04-12 ENCOUNTER — Other Ambulatory Visit: Payer: Self-pay

## 2019-04-12 NOTE — Patient Outreach (Signed)
[facility]) Care Management  04/12/2019  KHAMIYAH GREFE 1956/09/04 [id number]   Outgoing call to patient to inform her that she was approved for SCAT services and will receive certification packet via mail.   Patient confirmed receipt of food resources mailed on 03/22/19.   BSW closing case at this time.  Ronn Melena, BSW Social Worker [phone]

## 2019-04-13 DIAGNOSIS — M1990 Unspecified osteoarthritis, unspecified site: Secondary | ICD-10-CM | POA: Diagnosis not present

## 2019-04-13 DIAGNOSIS — L03115 Cellulitis of right lower limb: Secondary | ICD-10-CM | POA: Diagnosis not present

## 2019-04-13 DIAGNOSIS — M87051 Idiopathic aseptic necrosis of right femur: Secondary | ICD-10-CM | POA: Diagnosis not present

## 2019-04-13 DIAGNOSIS — I129 Hypertensive chronic kidney disease with stage 1 through stage 4 chronic kidney disease, or unspecified chronic kidney disease: Secondary | ICD-10-CM | POA: Diagnosis not present

## 2019-04-13 DIAGNOSIS — N185 Chronic kidney disease, stage 5: Secondary | ICD-10-CM | POA: Diagnosis not present

## 2019-04-14 ENCOUNTER — Telehealth (INDEPENDENT_AMBULATORY_CARE_PROVIDER_SITE_OTHER): Payer: Self-pay

## 2019-04-14 ENCOUNTER — Other Ambulatory Visit: Payer: Self-pay | Admitting: Pharmacist

## 2019-04-14 DIAGNOSIS — I10 Essential (primary) hypertension: Secondary | ICD-10-CM

## 2019-04-14 MED ORDER — LISINOPRIL 40 MG PO TABS: 40.0000 mg | ORAL_TABLET | Freq: Every day | ORAL | 0 refills | Status: DC

## 2019-04-14 NOTE — Telephone Encounter (Signed)
Patient called to request medication refill for mupirocin ointment (BACTROBAN) 2 % And also for lisinopril (PRINIVIL,ZESTRIL) 40 MG tablet    [facility] [address] [phone]  Please advice [phone]  Thank you Whitney Post

## 2019-04-17 ENCOUNTER — Other Ambulatory Visit (INDEPENDENT_AMBULATORY_CARE_PROVIDER_SITE_OTHER): Payer: Self-pay | Admitting: Primary Care

## 2019-04-17 DIAGNOSIS — I10 Essential (primary) hypertension: Secondary | ICD-10-CM

## 2019-04-17 DIAGNOSIS — M8588 Other specified disorders of bone density and structure, other site: Secondary | ICD-10-CM

## 2019-04-17 MED ORDER — LISINOPRIL 40 MG PO TABS: 40.0000 mg | ORAL_TABLET | Freq: Every day | ORAL | 0 refills | Status: DC

## 2019-04-17 MED ORDER — MUPIROCIN 2 % EX OINT: 1.0000 "application " | TOPICAL_OINTMENT | Freq: Two times a day (BID) | CUTANEOUS | 0 refills | Status: DC

## 2019-04-17 NOTE — Telephone Encounter (Signed)
FWD to PCP. [name], CMA  

## 2019-04-17 NOTE — Telephone Encounter (Signed)
meds refilled 

## 2019-04-18 DIAGNOSIS — I129 Hypertensive chronic kidney disease with stage 1 through stage 4 chronic kidney disease, or unspecified chronic kidney disease: Secondary | ICD-10-CM | POA: Diagnosis not present

## 2019-04-18 DIAGNOSIS — B182 Chronic viral hepatitis C: Secondary | ICD-10-CM | POA: Diagnosis not present

## 2019-04-18 DIAGNOSIS — L03115 Cellulitis of right lower limb: Secondary | ICD-10-CM | POA: Diagnosis not present

## 2019-04-18 DIAGNOSIS — N185 Chronic kidney disease, stage 5: Secondary | ICD-10-CM | POA: Diagnosis not present

## 2019-04-18 DIAGNOSIS — M1990 Unspecified osteoarthritis, unspecified site: Secondary | ICD-10-CM | POA: Diagnosis not present

## 2019-04-18 DIAGNOSIS — Z6841 Body Mass Index (BMI) 40.0 and over, adult: Secondary | ICD-10-CM | POA: Diagnosis not present

## 2019-04-18 DIAGNOSIS — Z9049 Acquired absence of other specified parts of digestive tract: Secondary | ICD-10-CM | POA: Diagnosis not present

## 2019-04-18 DIAGNOSIS — Z90710 Acquired absence of both cervix and uterus: Secondary | ICD-10-CM | POA: Diagnosis not present

## 2019-04-18 DIAGNOSIS — F329 Major depressive disorder, single episode, unspecified: Secondary | ICD-10-CM | POA: Diagnosis not present

## 2019-04-18 DIAGNOSIS — F17210 Nicotine dependence, cigarettes, uncomplicated: Secondary | ICD-10-CM | POA: Diagnosis not present

## 2019-04-18 DIAGNOSIS — E039 Hypothyroidism, unspecified: Secondary | ICD-10-CM | POA: Diagnosis not present

## 2019-04-18 DIAGNOSIS — M810 Age-related osteoporosis without current pathological fracture: Secondary | ICD-10-CM | POA: Diagnosis not present

## 2019-04-18 DIAGNOSIS — Z96698 Presence of other orthopedic joint implants: Secondary | ICD-10-CM | POA: Diagnosis not present

## 2019-04-18 DIAGNOSIS — Z8701 Personal history of pneumonia (recurrent): Secondary | ICD-10-CM | POA: Diagnosis not present

## 2019-04-18 DIAGNOSIS — K219 Gastro-esophageal reflux disease without esophagitis: Secondary | ICD-10-CM | POA: Diagnosis not present

## 2019-04-18 DIAGNOSIS — D631 Anemia in chronic kidney disease: Secondary | ICD-10-CM | POA: Diagnosis not present

## 2019-04-18 DIAGNOSIS — B3789 Other sites of candidiasis: Secondary | ICD-10-CM | POA: Diagnosis not present

## 2019-04-18 DIAGNOSIS — B181 Chronic viral hepatitis B without delta-agent: Secondary | ICD-10-CM | POA: Diagnosis not present

## 2019-04-18 DIAGNOSIS — M87051 Idiopathic aseptic necrosis of right femur: Secondary | ICD-10-CM | POA: Diagnosis not present

## 2019-04-20 ENCOUNTER — Other Ambulatory Visit: Payer: Self-pay

## 2019-04-20 DIAGNOSIS — L03115 Cellulitis of right lower limb: Secondary | ICD-10-CM | POA: Diagnosis not present

## 2019-04-20 DIAGNOSIS — M1990 Unspecified osteoarthritis, unspecified site: Secondary | ICD-10-CM | POA: Diagnosis not present

## 2019-04-20 DIAGNOSIS — M87051 Idiopathic aseptic necrosis of right femur: Secondary | ICD-10-CM | POA: Diagnosis not present

## 2019-04-20 DIAGNOSIS — I129 Hypertensive chronic kidney disease with stage 1 through stage 4 chronic kidney disease, or unspecified chronic kidney disease: Secondary | ICD-10-CM | POA: Diagnosis not present

## 2019-04-20 DIAGNOSIS — N185 Chronic kidney disease, stage 5: Secondary | ICD-10-CM | POA: Diagnosis not present

## 2019-04-20 NOTE — Patient Outreach (Signed)
[facility]) Care Management  04/20/2019  [name] 04-03-1956 [id number]  Called member at preferred number and member answered phone. Introduced self and explained reason for the call. HIPPA identifiers verified.   Member confirmed she has been approved for SCAT and has received pamphlet for locations for meal assistance. Member confirmed she has received her scale; however, is unable to use it properly as the scale requires for both feet to be placed and member is unable to place her right foot down. Member states multiple attempts; however, is unsuccessful.  Nutrition: Discussed requesting Provider to send referral to [place] [facility] on [address]. [place] and RN CM offered to call Provider for member; however, member states preference to schedule appointment with PCP and will request Nutrition referral at that time. Member states that her medications were suspended until face to face with provider; however, states she was able to request continuation of her medications and confirmed she has all medications. Member states she will be able to follow up 1x/month visit with Nutritionist.   Member states she has made meal adjustments since last spoke to RN CM:  Portion: member states she has decreased portion sizes for bread (wheat/rye). Previously, member ate 3 slices per meal and now 1 slice per meal.  Chip choices: member states her preference for Lays chips and for Pringles. Member now eating [employer] Chips (less salt) from [employer]. Member states she/her husband has not added salt to foods in 30 years.   Member states her husband prepares "solid meals" twice a week and other times pot pies or such. Member states the use of can meat. Education provided concerning frozen/fresh vegetables more healthier and less salty.   Member states that since she has made some meal adjustments for the last month, she feels like she has lost weight.  Member was provided praise for her effort and accomplishment.  Discussed with member that RN CM will transition care to [place] as RN CM will transition to new position and member verbalized understanding and agreement. Member verbalized appreciation for care received.   Will send discipline case closure letter to Provider. Will send referral to [place]. No other THN disciplines involved in care at this time.  THN CM Care Plan Problem One     Most Recent Value  Care Plan Problem One  Altered nutrition: more than body requirements related to use of food as coping mechanism per member's verbal report  Role Documenting the Problem One  Care Management [place] for Problem One  Active  [facility] Long Term Goal   Member will verbalize measures necessary to achieve weight reduction in next 90 days  THN Long Term Goal Start Date  03/17/19  Interventions for Problem One Long Term Goal  Discussed member's changes in meal portions and meal choices  THN CM Short Term Goal #1   Member will weigh self 1-2 times a week and document weight for next 30 days  THN CM Short Term Goal #1 Start Date  03/17/19  [facility] CM Short Term Goal #1 Met Date  04/20/19  Interventions for Short Term Goal #1  Assessed if member received scale that was mailed: member states inability to stand on scale properly in order to weigh self,  member states she will follow up with provider to assess weight  THN CM Short Term Goal #2   member will document meals eaten daily in [facility] calendar  for next 30 days  THN CM Short Term Goal #2 Start Date  03/17/19  [facility] CM Short Term Goal #2 Met Date  04/20/19  Interventions for Short Term Goal #2  member is not documenting meals,  however has decreased portions of bread and selectd some better meal choices,  Discussed Provider referral to [place].      Benjamine Mola "ANN" Josiah Lobo, RN-BSN  [facility] Care Management  Community Care Management Coordinator   [phone] [email or web address]

## 2019-04-24 ENCOUNTER — Other Ambulatory Visit: Payer: Self-pay

## 2019-04-25 DIAGNOSIS — I129 Hypertensive chronic kidney disease with stage 1 through stage 4 chronic kidney disease, or unspecified chronic kidney disease: Secondary | ICD-10-CM | POA: Diagnosis not present

## 2019-04-25 DIAGNOSIS — N185 Chronic kidney disease, stage 5: Secondary | ICD-10-CM | POA: Diagnosis not present

## 2019-04-25 DIAGNOSIS — M87051 Idiopathic aseptic necrosis of right femur: Secondary | ICD-10-CM | POA: Diagnosis not present

## 2019-04-25 DIAGNOSIS — M1990 Unspecified osteoarthritis, unspecified site: Secondary | ICD-10-CM | POA: Diagnosis not present

## 2019-04-25 DIAGNOSIS — L03115 Cellulitis of right lower limb: Secondary | ICD-10-CM | POA: Diagnosis not present

## 2019-04-26 DIAGNOSIS — M87051 Idiopathic aseptic necrosis of right femur: Secondary | ICD-10-CM | POA: Diagnosis not present

## 2019-04-26 DIAGNOSIS — N185 Chronic kidney disease, stage 5: Secondary | ICD-10-CM | POA: Diagnosis not present

## 2019-04-26 DIAGNOSIS — I129 Hypertensive chronic kidney disease with stage 1 through stage 4 chronic kidney disease, or unspecified chronic kidney disease: Secondary | ICD-10-CM | POA: Diagnosis not present

## 2019-04-26 DIAGNOSIS — M1990 Unspecified osteoarthritis, unspecified site: Secondary | ICD-10-CM | POA: Diagnosis not present

## 2019-04-26 DIAGNOSIS — L03115 Cellulitis of right lower limb: Secondary | ICD-10-CM | POA: Diagnosis not present

## 2019-04-27 DIAGNOSIS — M87051 Idiopathic aseptic necrosis of right femur: Secondary | ICD-10-CM | POA: Diagnosis not present

## 2019-04-27 DIAGNOSIS — N185 Chronic kidney disease, stage 5: Secondary | ICD-10-CM | POA: Diagnosis not present

## 2019-04-27 DIAGNOSIS — I129 Hypertensive chronic kidney disease with stage 1 through stage 4 chronic kidney disease, or unspecified chronic kidney disease: Secondary | ICD-10-CM | POA: Diagnosis not present

## 2019-04-27 DIAGNOSIS — L03115 Cellulitis of right lower limb: Secondary | ICD-10-CM | POA: Diagnosis not present

## 2019-04-27 DIAGNOSIS — M1990 Unspecified osteoarthritis, unspecified site: Secondary | ICD-10-CM | POA: Diagnosis not present

## 2019-05-02 DIAGNOSIS — M87051 Idiopathic aseptic necrosis of right femur: Secondary | ICD-10-CM | POA: Diagnosis not present

## 2019-05-02 DIAGNOSIS — N185 Chronic kidney disease, stage 5: Secondary | ICD-10-CM | POA: Diagnosis not present

## 2019-05-02 DIAGNOSIS — L03115 Cellulitis of right lower limb: Secondary | ICD-10-CM | POA: Diagnosis not present

## 2019-05-02 DIAGNOSIS — I129 Hypertensive chronic kidney disease with stage 1 through stage 4 chronic kidney disease, or unspecified chronic kidney disease: Secondary | ICD-10-CM | POA: Diagnosis not present

## 2019-05-02 DIAGNOSIS — M1990 Unspecified osteoarthritis, unspecified site: Secondary | ICD-10-CM | POA: Diagnosis not present

## 2019-05-04 DIAGNOSIS — N185 Chronic kidney disease, stage 5: Secondary | ICD-10-CM | POA: Diagnosis not present

## 2019-05-04 DIAGNOSIS — M87051 Idiopathic aseptic necrosis of right femur: Secondary | ICD-10-CM | POA: Diagnosis not present

## 2019-05-04 DIAGNOSIS — L03115 Cellulitis of right lower limb: Secondary | ICD-10-CM | POA: Diagnosis not present

## 2019-05-04 DIAGNOSIS — I129 Hypertensive chronic kidney disease with stage 1 through stage 4 chronic kidney disease, or unspecified chronic kidney disease: Secondary | ICD-10-CM | POA: Diagnosis not present

## 2019-05-04 DIAGNOSIS — M1990 Unspecified osteoarthritis, unspecified site: Secondary | ICD-10-CM | POA: Diagnosis not present

## 2019-05-09 DIAGNOSIS — M1990 Unspecified osteoarthritis, unspecified site: Secondary | ICD-10-CM | POA: Diagnosis not present

## 2019-05-09 DIAGNOSIS — L03115 Cellulitis of right lower limb: Secondary | ICD-10-CM | POA: Diagnosis not present

## 2019-05-09 DIAGNOSIS — I129 Hypertensive chronic kidney disease with stage 1 through stage 4 chronic kidney disease, or unspecified chronic kidney disease: Secondary | ICD-10-CM | POA: Diagnosis not present

## 2019-05-09 DIAGNOSIS — M87051 Idiopathic aseptic necrosis of right femur: Secondary | ICD-10-CM | POA: Diagnosis not present

## 2019-05-09 DIAGNOSIS — N185 Chronic kidney disease, stage 5: Secondary | ICD-10-CM | POA: Diagnosis not present

## 2019-05-11 DIAGNOSIS — N185 Chronic kidney disease, stage 5: Secondary | ICD-10-CM | POA: Diagnosis not present

## 2019-05-11 DIAGNOSIS — I129 Hypertensive chronic kidney disease with stage 1 through stage 4 chronic kidney disease, or unspecified chronic kidney disease: Secondary | ICD-10-CM | POA: Diagnosis not present

## 2019-05-11 DIAGNOSIS — M1990 Unspecified osteoarthritis, unspecified site: Secondary | ICD-10-CM | POA: Diagnosis not present

## 2019-05-11 DIAGNOSIS — L03115 Cellulitis of right lower limb: Secondary | ICD-10-CM | POA: Diagnosis not present

## 2019-05-11 DIAGNOSIS — M87051 Idiopathic aseptic necrosis of right femur: Secondary | ICD-10-CM | POA: Diagnosis not present

## 2019-05-16 DIAGNOSIS — L03115 Cellulitis of right lower limb: Secondary | ICD-10-CM | POA: Diagnosis not present

## 2019-05-16 DIAGNOSIS — I129 Hypertensive chronic kidney disease with stage 1 through stage 4 chronic kidney disease, or unspecified chronic kidney disease: Secondary | ICD-10-CM | POA: Diagnosis not present

## 2019-05-16 DIAGNOSIS — M87051 Idiopathic aseptic necrosis of right femur: Secondary | ICD-10-CM | POA: Diagnosis not present

## 2019-05-16 DIAGNOSIS — N185 Chronic kidney disease, stage 5: Secondary | ICD-10-CM | POA: Diagnosis not present

## 2019-05-16 DIAGNOSIS — M1990 Unspecified osteoarthritis, unspecified site: Secondary | ICD-10-CM | POA: Diagnosis not present

## 2019-05-18 ENCOUNTER — Other Ambulatory Visit: Payer: Self-pay

## 2019-05-18 DIAGNOSIS — K219 Gastro-esophageal reflux disease without esophagitis: Secondary | ICD-10-CM | POA: Diagnosis not present

## 2019-05-18 DIAGNOSIS — M87051 Idiopathic aseptic necrosis of right femur: Secondary | ICD-10-CM | POA: Diagnosis not present

## 2019-05-18 DIAGNOSIS — I129 Hypertensive chronic kidney disease with stage 1 through stage 4 chronic kidney disease, or unspecified chronic kidney disease: Secondary | ICD-10-CM | POA: Diagnosis not present

## 2019-05-18 DIAGNOSIS — M1990 Unspecified osteoarthritis, unspecified site: Secondary | ICD-10-CM | POA: Diagnosis not present

## 2019-05-18 DIAGNOSIS — B181 Chronic viral hepatitis B without delta-agent: Secondary | ICD-10-CM | POA: Diagnosis not present

## 2019-05-18 DIAGNOSIS — B3789 Other sites of candidiasis: Secondary | ICD-10-CM | POA: Diagnosis not present

## 2019-05-18 DIAGNOSIS — D631 Anemia in chronic kidney disease: Secondary | ICD-10-CM | POA: Diagnosis not present

## 2019-05-18 DIAGNOSIS — Z9049 Acquired absence of other specified parts of digestive tract: Secondary | ICD-10-CM | POA: Diagnosis not present

## 2019-05-18 DIAGNOSIS — Z96698 Presence of other orthopedic joint implants: Secondary | ICD-10-CM | POA: Diagnosis not present

## 2019-05-18 DIAGNOSIS — Z6841 Body Mass Index (BMI) 40.0 and over, adult: Secondary | ICD-10-CM | POA: Diagnosis not present

## 2019-05-18 DIAGNOSIS — M810 Age-related osteoporosis without current pathological fracture: Secondary | ICD-10-CM | POA: Diagnosis not present

## 2019-05-18 DIAGNOSIS — F329 Major depressive disorder, single episode, unspecified: Secondary | ICD-10-CM | POA: Diagnosis not present

## 2019-05-18 DIAGNOSIS — E039 Hypothyroidism, unspecified: Secondary | ICD-10-CM | POA: Diagnosis not present

## 2019-05-18 DIAGNOSIS — Z90710 Acquired absence of both cervix and uterus: Secondary | ICD-10-CM | POA: Diagnosis not present

## 2019-05-18 DIAGNOSIS — F17210 Nicotine dependence, cigarettes, uncomplicated: Secondary | ICD-10-CM | POA: Diagnosis not present

## 2019-05-18 DIAGNOSIS — B182 Chronic viral hepatitis C: Secondary | ICD-10-CM | POA: Diagnosis not present

## 2019-05-18 DIAGNOSIS — Z8701 Personal history of pneumonia (recurrent): Secondary | ICD-10-CM | POA: Diagnosis not present

## 2019-05-18 DIAGNOSIS — N185 Chronic kidney disease, stage 5: Secondary | ICD-10-CM | POA: Diagnosis not present

## 2019-05-18 NOTE — Patient Outreach (Signed)
[facility]) Care Management  05/18/2019  [name] 04/18/1956 [id number]    1st unsuccessful outreach to the patient for initial assessment.  No answer.  Phone rang 6 times and no pick up.  Unable to leave a message no voicemail pick up.  Plan: RN Health Coach will send letter. RN Health Coach will make outreach attempt to the patient within thirty business days.  Lazaro Arms RN, BSN, [place] Direct Dial:  [phone]  Fax: [phone]

## 2019-05-23 ENCOUNTER — Other Ambulatory Visit: Payer: Self-pay

## 2019-05-23 NOTE — Patient Outreach (Signed)
[facility]) Care Management  05/23/2019  [name] [birth year] [id number]    2nd outreach attempt to the patient for initial assessment.  No answer.  Phone rang over six times with no answer or voicemail pickup.  Plan: RN Health Coach will make outreach attempt to the patient within thirty business  days.   Lazaro Arms RN, BSN, [place] Direct Dial:  [phone]  Fax: [phone]

## 2019-05-24 ENCOUNTER — Ambulatory Visit (INDEPENDENT_AMBULATORY_CARE_PROVIDER_SITE_OTHER): Payer: Medicare Other | Admitting: Primary Care

## 2019-05-25 DIAGNOSIS — I129 Hypertensive chronic kidney disease with stage 1 through stage 4 chronic kidney disease, or unspecified chronic kidney disease: Secondary | ICD-10-CM | POA: Diagnosis not present

## 2019-05-25 DIAGNOSIS — M87051 Idiopathic aseptic necrosis of right femur: Secondary | ICD-10-CM | POA: Diagnosis not present

## 2019-05-25 DIAGNOSIS — M1990 Unspecified osteoarthritis, unspecified site: Secondary | ICD-10-CM | POA: Diagnosis not present

## 2019-05-25 DIAGNOSIS — D631 Anemia in chronic kidney disease: Secondary | ICD-10-CM | POA: Diagnosis not present

## 2019-05-25 DIAGNOSIS — N185 Chronic kidney disease, stage 5: Secondary | ICD-10-CM | POA: Diagnosis not present

## 2019-05-26 DIAGNOSIS — M1990 Unspecified osteoarthritis, unspecified site: Secondary | ICD-10-CM | POA: Diagnosis not present

## 2019-05-26 DIAGNOSIS — N185 Chronic kidney disease, stage 5: Secondary | ICD-10-CM | POA: Diagnosis not present

## 2019-05-26 DIAGNOSIS — I129 Hypertensive chronic kidney disease with stage 1 through stage 4 chronic kidney disease, or unspecified chronic kidney disease: Secondary | ICD-10-CM | POA: Diagnosis not present

## 2019-05-26 DIAGNOSIS — D631 Anemia in chronic kidney disease: Secondary | ICD-10-CM | POA: Diagnosis not present

## 2019-05-26 DIAGNOSIS — M87051 Idiopathic aseptic necrosis of right femur: Secondary | ICD-10-CM | POA: Diagnosis not present

## 2019-06-01 DIAGNOSIS — N185 Chronic kidney disease, stage 5: Secondary | ICD-10-CM | POA: Diagnosis not present

## 2019-06-01 DIAGNOSIS — M87051 Idiopathic aseptic necrosis of right femur: Secondary | ICD-10-CM | POA: Diagnosis not present

## 2019-06-01 DIAGNOSIS — M1990 Unspecified osteoarthritis, unspecified site: Secondary | ICD-10-CM | POA: Diagnosis not present

## 2019-06-01 DIAGNOSIS — I129 Hypertensive chronic kidney disease with stage 1 through stage 4 chronic kidney disease, or unspecified chronic kidney disease: Secondary | ICD-10-CM | POA: Diagnosis not present

## 2019-06-01 DIAGNOSIS — D631 Anemia in chronic kidney disease: Secondary | ICD-10-CM | POA: Diagnosis not present

## 2019-06-05 ENCOUNTER — Ambulatory Visit (INDEPENDENT_AMBULATORY_CARE_PROVIDER_SITE_OTHER): Payer: Medicare Other | Admitting: Primary Care

## 2019-06-05 ENCOUNTER — Other Ambulatory Visit: Payer: Self-pay

## 2019-06-05 ENCOUNTER — Encounter (INDEPENDENT_AMBULATORY_CARE_PROVIDER_SITE_OTHER): Payer: Self-pay | Admitting: Primary Care

## 2019-06-05 DIAGNOSIS — F17210 Nicotine dependence, cigarettes, uncomplicated: Secondary | ICD-10-CM | POA: Diagnosis not present

## 2019-06-05 DIAGNOSIS — I10 Essential (primary) hypertension: Secondary | ICD-10-CM | POA: Diagnosis not present

## 2019-06-05 DIAGNOSIS — E039 Hypothyroidism, unspecified: Secondary | ICD-10-CM | POA: Diagnosis not present

## 2019-06-05 DIAGNOSIS — E785 Hyperlipidemia, unspecified: Secondary | ICD-10-CM

## 2019-06-05 DIAGNOSIS — M8588 Other specified disorders of bone density and structure, other site: Secondary | ICD-10-CM

## 2019-06-05 DIAGNOSIS — Z23 Encounter for immunization: Secondary | ICD-10-CM | POA: Diagnosis not present

## 2019-06-05 DIAGNOSIS — F17200 Nicotine dependence, unspecified, uncomplicated: Secondary | ICD-10-CM

## 2019-06-05 DIAGNOSIS — I872 Venous insufficiency (chronic) (peripheral): Secondary | ICD-10-CM | POA: Diagnosis not present

## 2019-06-05 MED ORDER — ALENDRONATE SODIUM 70 MG PO TABS: 70.0000 mg | ORAL_TABLET | ORAL | 3 refills | Status: DC

## 2019-06-05 MED ORDER — LEVOTHYROXINE SODIUM 75 MCG PO TABS: 75.0000 ug | ORAL_TABLET | Freq: Every day | ORAL | 0 refills | Status: DC

## 2019-06-05 MED ORDER — CALTRATE 600+D PLUS MINERALS 600-800 MG-UNIT PO TABS: 2.0000 | ORAL_TABLET | Freq: Every day | ORAL | 3 refills | Status: DC

## 2019-06-05 MED ORDER — MUPIROCIN 2 % EX OINT: 1.0000 "application " | TOPICAL_OINTMENT | Freq: Two times a day (BID) | CUTANEOUS | 0 refills | Status: DC

## 2019-06-05 MED ORDER — LISINOPRIL 40 MG PO TABS: 40.0000 mg | ORAL_TABLET | Freq: Every day | ORAL | 0 refills | Status: DC

## 2019-06-05 MED ORDER — PRAVASTATIN SODIUM 40 MG PO TABS: 40.0000 mg | ORAL_TABLET | Freq: Every day | ORAL | 1 refills | Status: DC

## 2019-06-05 MED ORDER — ESCITALOPRAM OXALATE 10 MG PO TABS: 10.0000 mg | ORAL_TABLET | Freq: Every day | ORAL | 0 refills | Status: DC

## 2019-06-05 NOTE — Progress Notes (Signed)
Established Patient Office Visit  Subjective:  Patient ID: [name], female    DOB: [birth year]  Age: 63 y.o. MRN: [id number]  CC:  Chief Complaint  Patient presents with  . Thyroid Problem  . Medication Refill    ALL    HPI [name] presents for management of thyroids and labs to be done at this visit. She voices only complaint that if she is left in her motorized chair any longer the pain and stiffness gets worst. She stated transportation took 1 1/2 ride to here appointment  Past Medical History:  Diagnosis Date  . Anemia   . Arthritis   . AVN (avascular necrosis of bone) ([facility]) 04/14/2013  . Chronic kidney disease   . Complication of anesthesia   . Decreased mobility    hx. Rt. hip "avascular necrosis" -unable to weight bear long periods, "using wheelchair"..  . Depression   . GERD (gastroesophageal reflux disease)   . Hep B w/o coma   . Hep C w/o coma, chronic ([place])    07-20-14 being presently tx. "[name]"-good response per pt. will complete in 7days- Dr. Linus Hobbs follows [name] infectious disease control.  . Hypertension   . Hypothyroid    mild- no medication  . Knee stiffness    right knee -hard to bend at knee  . Muscle cramps   . Obesity   . Osteoporosis   . Pneumonia   . PONV (postoperative nausea and vomiting)    only with Demerol  . Shortness of breath dyspnea    still smokes  . Superficial thrombophlebitis    left leg  . Transfusion history    70's, 80's ? hepatitis C attributed to past transfusions    Past Surgical History:  Procedure Laterality Date  . ABDOMINAL HYSTERECTOMY    . APPENDECTOMY     thinks it was removed with hysterectomy  . arthroscopies     knee-right x 7  . CHOLECYSTECTOMY    . COLON SURGERY    . COLONOSCOPY    . COLONOSCOPY WITH PROPOFOL N/A 02/26/2015   Procedure: COLONOSCOPY WITH PROPOFOL;  Surgeon: Inda Castle, MD;  Location: [facility];  Service: Endoscopy;  Laterality: N/A;  . COLONOSCOPY WITH PROPOFOL  N/A 03/03/2016   Procedure: COLONOSCOPY WITH PROPOFOL;  Surgeon: Mauri Pole, MD;  Location: [facility];  Service: Endoscopy;  Laterality: N/A;  . ESOPHAGOGASTRODUODENOSCOPY (EGD) WITH PROPOFOL N/A 03/03/2016   Procedure: ESOPHAGOGASTRODUODENOSCOPY (EGD) WITH PROPOFOL;  Surgeon: Mauri Pole, MD;  Location: [facility];  Service: Endoscopy;  Laterality: N/A;  . JOINT REPLACEMENT     right knee  . TOTAL KNEE ARTHROPLASTY      Family History  Problem Relation Age of Onset  . Cancer Mother   . Diabetes Mother   . Heart disease Mother   . Hypertension Mother   . Cancer Father   . Hypertension Sister   . Hypertension Brother   . Diabetes Brother   . Colon cancer Neg Hx     Social History   Socioeconomic History  . Marital status: Divorced    Spouse name: Not on file  . Number of children: Not on file  . Years of education: Not on file  . Highest education level: Not on file  Occupational History  . Not on file  Social Needs  . Financial resource strain: Somewhat hard  . Food insecurity    Worry: Sometimes true    Inability: Sometimes true  . Transportation needs  Medical: Yes    Non-medical: Yes  Tobacco Use  . Smoking status: Current Every Day Smoker    Packs/day: 0.50    Years: 20.00    Pack years: 10.00    Types: Cigarettes  . Smokeless tobacco: Never Used  . Tobacco comment: trying to cut back  Substance and Sexual Activity  . Alcohol use: No    Alcohol/week: 0.0 standard drinks  . Drug use: No  . Sexual activity: Not Currently  Lifestyle  . Physical activity    Days per week: 0 days    Minutes per session: 0 min  . Stress: Very much  Relationships  . Social Herbalist on phone: Once a week    Gets together: Never    Attends religious service: Never    Active member of club or organization: No    Attends meetings of clubs or organizations: Never    Relationship status: Living with partner  . Intimate partner violence    Fear  of current or ex partner: No    Emotionally abused: No    Physically abused: No    Forced sexual activity: No  Other Topics Concern  . Not on file  Social History Narrative  . Not on file    Outpatient Medications Prior to Visit  Medication Sig Dispense Refill  . clotrimazole-betamethasone (LOTRISONE) cream Apply 1 application topically 2 (two) times daily. (Patient taking differently: Apply 1 application topically 2 (two) times daily as needed (under breast). ) 30 g 0  . diclofenac sodium (VOLTAREN) 1 % GEL Apply 2 g topically 4 (four) times daily as needed (pain). 2 Tube 2  . diphenoxylate-atropine (LOMOTIL) 2.5-0.025 MG tablet TAKE 1 TABLET BY MOUTH FOUR TIMES A DAY AS NEEDED FOR DIARRHEA OR LOOSE STOOLS (Patient taking differently: Take 1 tablet by mouth 4 (four) times daily as needed for diarrhea or loose stools. ) 60 tablet 3  . omeprazole (PRILOSEC) 40 MG capsule Take 1 capsule (40 mg total) by mouth daily. (Patient taking differently: Take 40 mg by mouth daily as needed (acid reflux). ) 90 capsule 1  . alendronate (FOSAMAX) 70 MG tablet Take 1 tablet (70 mg total) by mouth once a week. Take with a full glass of water on an empty stomach. 12 tablet 3  . Calcium Carbonate-Vit D-Min (CALTRATE 600+D PLUS MINERALS) 600-800 MG-UNIT TABS Take 2 tablets by mouth daily. 180 tablet 3  . escitalopram (LEXAPRO) 10 MG tablet Take 1 tablet (10 mg total) by mouth daily. Needs to keep appt in June for more refills. 90 tablet 0  . levothyroxine (SYNTHROID, LEVOTHROID) 75 MCG tablet Take 1 tablet (75 mcg total) by mouth daily. 30 tablet 5  . lisinopril (ZESTRIL) 40 MG tablet Take 1 tablet (40 mg total) by mouth at bedtime. 90 tablet 0  . mupirocin ointment (BACTROBAN) 2 % Apply 1 application topically 2 (two) times daily. 22 g 0  . pravastatin (PRAVACHOL) 40 MG tablet Take 1 tablet (40 mg total) by mouth daily. 90 tablet 1  . amLODipine (NORVASC) 2.5 MG tablet TAKE 1 TABLET (2.5 MG TOTAL) BY MOUTH  DAILY. (Patient not taking: Reported on 03/17/2019) 30 tablet 1  . doxycycline (VIBRAMYCIN) 100 MG capsule Take 1 capsule (100 mg total) by mouth 2 (two) times daily. 20 capsule 0  . HYDROcodone-acetaminophen (NORCO/VICODIN) 5-325 MG tablet Take 2 tablets by mouth every 4 (four) hours as needed. 10 tablet 0   No facility-administered medications prior to visit.  Allergies  Allergen Reactions  . Penicillins Anaphylaxis and Other (See Comments)    Has patient had a PCN reaction causing immediate rash, facial/tongue/throat swelling, SOB or lightheadedness with hypotension: Yes Has patient had a PCN reaction causing severe rash involving mucus membranes or skin necrosis: No Has patient had a PCN reaction that required hospitalization: Yes Has patient had a PCN reaction occurring within the last 10 years: No If all of the above answers are "NO", then may proceed with Cephalosporin use.   . Demerol Nausea And Vomiting  . Meperidine Nausea And Vomiting    ROS Review of Systems  Respiratory: Positive for shortness of breath and wheezing.   Cardiovascular: Positive for leg swelling.  Musculoskeletal: Positive for arthralgias and back pain.  All other systems reviewed and are negative.     Objective:    Physical Exam  Constitutional: She is oriented to person, place, and time. She appears well-developed and well-nourished.  Morbid obesity  Neck: Normal range of motion.  Cardiovascular: Regular rhythm.  Pulmonary/Chest: She has wheezes.  Abdominal: Soft. Bowel sounds are normal. She exhibits distension.  Musculoskeletal:        General: Edema present.  Neurological: She is oriented to person, place, and time.  Skin: There is erythema.  Psychiatric: She has a normal mood and affect.    There were no vitals taken for this visit. Wt Readings from Last 3 Encounters:  03/17/19 (!) 331 lb (150.1 kg)  05/08/18 (!) 326 lb 11.6 oz (148.2 kg)  12/23/17 (!) 326 lb 12.8 oz (148.2 kg)      Health Maintenance Due  Topic Date Due  . MAMMOGRAM  02/13/2019    There are no preventive care reminders to display for this patient.  Lab Results  Component Value Date   TSH 6.670 (H) 06/05/2019   Lab Results  Component Value Date   WBC 8.3 06/05/2019   HGB 13.5 06/05/2019   HCT 41.4 06/05/2019   MCV 90 06/05/2019   PLT 160 06/05/2019   Lab Results  Component Value Date   NA 140 06/05/2019   K 4.1 06/05/2019   CO2 26 06/05/2019   GLUCOSE 118 (H) 06/05/2019   BUN 9 06/05/2019   CREATININE 0.63 06/05/2019   BILITOT 0.3 06/05/2019   ALKPHOS 70 06/05/2019   AST 16 06/05/2019   ALT 10 06/05/2019   PROT 7.6 06/05/2019   ALBUMIN 4.0 06/05/2019   CALCIUM 9.1 06/05/2019   GFR 114.77 05/28/2015   Lab Results  Component Value Date   CHOL 163 06/05/2019   Lab Results  Component Value Date   HDL 39 (L) 06/05/2019   Lab Results  Component Value Date   LDLCALC 103 (H) 06/05/2019   Lab Results  Component Value Date   TRIG 118 06/05/2019   Lab Results  Component Value Date   CHOLHDL 4.2 06/05/2019   Lab Results  Component Value Date   HGBA1C 5.6 03/24/2018      Assessment & Plan:  Deshaun was seen today for thyroid problem and medication refill.  Diagnoses and all orders for this visit:  Dyslipidemia with high LDL and low HDL -     Lipid Panel -     pravastatin (PRAVACHOL) 40 MG tablet; Take 1 tablet (40 mg total) by mouth daily.  Need for immunization against influenza -     Flu Vaccine QUAD 36+ mos IM  Chronic venous insufficiency  Smoking  She is aware of increase risk for respiratory diseases there is  very little she can enjoy but will  recommend cessation at every visit  Hypothyroidism, unspecified type Patient  Takes her medication on an empty stomach 86mns-1 hour before food to have the most effective absorption. I  sent in levothyroxine 762m  Mcg. Re-evaluate dose with lab review -     TSH + free T4 -     levothyroxine (SYNTHROID) 75  MCG tablet; Take 1 tablet (75 mcg total) by mouth daily.  -   Osteopenia of spine Continue -     Calcium Carbonate-Vit D-Min (CALTRATE 600+D PLUS MINERALS) 600-800 MG-UNIT TABS; Take 2 tablets by mouth daily. -     alendronate (FOSAMAX) 70 MG tablet; Take 1 tablet (70 mg total) by mouth once a week. Take with a full glass of water on an empty stomach.  Essential hypertension, benign Counseled on blood pressure goal of less than 130/80, low-sodium, DASH diet, medication compliance, 150 minutes of moderate intensity exercise per week. Discussed medication compliance, adverse effects.    CBC with Differential -     CMP14+EGFR  -     lisinopril (ZESTRIL) 40 MG tablet; Take 1 tablet (40 mg total) by mouth at bedtime.  Other orders -     mupirocin ointment (BACTROBAN) 2 %; Apply 1 application topically 2 (two) times daily. -     escitalopram (LEXAPRO) 10 MG tablet; Take 1 tablet (10 mg total) by mouth daily. Needs to keep appt in June for more refills.    Meds ordered this encounter  Medications  . mupirocin ointment (BACTROBAN) 2 %    Sig: Apply 1 application topically 2 (two) times daily.    Dispense:  22 g    Refill:  0  . escitalopram (LEXAPRO) 10 MG tablet    Sig: Take 1 tablet (10 mg total) by mouth daily. Needs to keep appt in June for more refills.    Dispense:  90 tablet    Refill:  0  . Calcium Carbonate-Vit D-Min (CALTRATE 600+D PLUS MINERALS) 600-800 MG-UNIT TABS    Sig: Take 2 tablets by mouth daily.    Dispense:  180 tablet    Refill:  3  . lisinopril (ZESTRIL) 40 MG tablet    Sig: Take 1 tablet (40 mg total) by mouth at bedtime.    Dispense:  90 tablet    Refill:  0  . alendronate (FOSAMAX) 70 MG tablet    Sig: Take 1 tablet (70 mg total) by mouth once a week. Take with a full glass of water on an empty stomach.    Dispense:  12 tablet    Refill:  3  . levothyroxine (SYNTHROID) 75 MCG tablet    Sig: Take 1 tablet (75 mcg total) by mouth daily.    Dispense:  30  tablet    Refill:  0  . pravastatin (PRAVACHOL) 40 MG tablet    Sig: Take 1 tablet (40 mg total) by mouth daily.    Dispense:  90 tablet    Refill:  1    Follow-up: Return in about 3 months (around 09/04/2019) for hypothyroid .    MiKerin PernaNP

## 2019-06-06 LAB — CBC WITH DIFFERENTIAL/PLATELET
Basophils Absolute: 0.1 10*3/uL (ref 0.0–0.2)
Basos: 1 %
EOS (ABSOLUTE): 0.1 10*3/uL (ref 0.0–0.4)
Eos: 1 %
Hematocrit: 41.4 % (ref 34.0–46.6)
Hemoglobin: 13.5 g/dL (ref 11.1–15.9)
Immature Grans (Abs): 0 10*3/uL (ref 0.0–0.1)
Immature Granulocytes: 1 %
Lymphocytes Absolute: 2.4 10*3/uL (ref 0.7–3.1)
Lymphs: 29 %
MCH: 29.5 pg (ref 26.6–33.0)
MCHC: 32.6 g/dL (ref 31.5–35.7)
MCV: 90 fL (ref 79–97)
Monocytes Absolute: 0.5 10*3/uL (ref 0.1–0.9)
Monocytes: 7 %
Neutrophils Absolute: 5.2 10*3/uL (ref 1.4–7.0)
Neutrophils: 61 %
Platelets: 160 10*3/uL (ref 150–450)
RBC: 4.58 x10E6/uL (ref 3.77–5.28)
RDW: 13.5 % (ref 11.7–15.4)
WBC: 8.3 10*3/uL (ref 3.4–10.8)

## 2019-06-06 LAB — CMP14+EGFR
ALT: 10 IU/L (ref 0–32)
AST: 16 IU/L (ref 0–40)
Albumin/Globulin Ratio: 1.1 — ABNORMAL LOW (ref 1.2–2.2)
Albumin: 4 g/dL (ref 3.8–4.8)
Alkaline Phosphatase: 70 IU/L (ref 39–117)
BUN/Creatinine Ratio: 14 (ref 12–28)
BUN: 9 mg/dL (ref 8–27)
Bilirubin Total: 0.3 mg/dL (ref 0.0–1.2)
CO2: 26 mmol/L (ref 20–29)
Calcium: 9.1 mg/dL (ref 8.7–10.3)
Chloride: 100 mmol/L (ref 96–106)
Creatinine, Ser: 0.63 mg/dL (ref 0.57–1.00)
GFR calc Af Amer: 110 mL/min/{1.73_m2} (ref >59)
GFR calc non Af Amer: 95 mL/min/{1.73_m2} (ref >59)
Globulin, Total: 3.6 g/dL (ref 1.5–4.5)
Glucose: 118 mg/dL — ABNORMAL HIGH (ref 65–99)
Potassium: 4.1 mmol/L (ref 3.5–5.2)
Sodium: 140 mmol/L (ref 134–144)
Total Protein: 7.6 g/dL (ref 6.0–8.5)

## 2019-06-06 LAB — LIPID PANEL
Chol/HDL Ratio: 4.2 ratio (ref 0.0–4.4)
Cholesterol, Total: 163 mg/dL (ref 100–199)
HDL: 39 mg/dL — ABNORMAL LOW (ref >39)
LDL Chol Calc (NIH): 103 mg/dL — ABNORMAL HIGH (ref 0–99)
Triglycerides: 118 mg/dL (ref 0–149)
VLDL Cholesterol Cal: 21 mg/dL (ref 5–40)

## 2019-06-06 LAB — TSH+FREE T4
Free T4: 1.23 ng/dL (ref 0.82–1.77)
TSH: 6.67 u[IU]/mL — ABNORMAL HIGH (ref 0.450–4.500)

## 2019-06-06 LAB — VITAMIN D 25 HYDROXY (VIT D DEFICIENCY, FRACTURES): Vit D, 25-Hydroxy: 14.3 ng/mL — ABNORMAL LOW (ref 30.0–100.0)

## 2019-06-09 NOTE — Progress Notes (Signed)
This encounter was created in error - please disregard.

## 2019-06-12 ENCOUNTER — Other Ambulatory Visit (INDEPENDENT_AMBULATORY_CARE_PROVIDER_SITE_OTHER): Payer: Self-pay | Admitting: Primary Care

## 2019-06-12 MED ORDER — VITAMIN D (ERGOCALCIFEROL) 1.25 MG (50000 UNIT) PO CAPS: 50000.0000 [IU] | ORAL_CAPSULE | ORAL | 0 refills | Status: DC

## 2019-06-13 ENCOUNTER — Telehealth (INDEPENDENT_AMBULATORY_CARE_PROVIDER_SITE_OTHER): Payer: Self-pay

## 2019-06-13 NOTE — Telephone Encounter (Signed)
-----   Message from Kerin Perna, NP sent at 06/12/2019  7:55 PM EDT ----- Vit D is low Vitamin D is needed to make and keep bones strong. The patient will need to take a prescription strength vitamin D tablet once weekly until next appointment.  Vitamin D level will be rechecked at future visit.  I have sent the vitamin D tablet to the pharmacy and it should be ready for pick up.  Other labs are acceptable

## 2019-06-13 NOTE — Telephone Encounter (Signed)
Patient is aware that vitamin D is low. Supplement sent to pharmacy. Take one weekly. Will recheck vitamin D at future appointment. All other labs normal. Patient expressed understanding and did not have any questions. Nat Christen, CMA

## 2019-06-16 ENCOUNTER — Telehealth: Payer: Self-pay | Admitting: Primary Care

## 2019-06-16 DIAGNOSIS — M1990 Unspecified osteoarthritis, unspecified site: Secondary | ICD-10-CM | POA: Diagnosis not present

## 2019-06-16 DIAGNOSIS — I129 Hypertensive chronic kidney disease with stage 1 through stage 4 chronic kidney disease, or unspecified chronic kidney disease: Secondary | ICD-10-CM | POA: Diagnosis not present

## 2019-06-16 DIAGNOSIS — D631 Anemia in chronic kidney disease: Secondary | ICD-10-CM | POA: Diagnosis not present

## 2019-06-16 DIAGNOSIS — N185 Chronic kidney disease, stage 5: Secondary | ICD-10-CM | POA: Diagnosis not present

## 2019-06-16 DIAGNOSIS — M87051 Idiopathic aseptic necrosis of right femur: Secondary | ICD-10-CM | POA: Diagnosis not present

## 2019-06-16 NOTE — Telephone Encounter (Signed)
Mickel Baas with med [facility] called to inform they are discharging home health PT by patient request.

## 2019-06-17 DIAGNOSIS — B182 Chronic viral hepatitis C: Secondary | ICD-10-CM | POA: Diagnosis not present

## 2019-06-17 DIAGNOSIS — F17210 Nicotine dependence, cigarettes, uncomplicated: Secondary | ICD-10-CM | POA: Diagnosis not present

## 2019-06-17 DIAGNOSIS — F329 Major depressive disorder, single episode, unspecified: Secondary | ICD-10-CM | POA: Diagnosis not present

## 2019-06-17 DIAGNOSIS — E039 Hypothyroidism, unspecified: Secondary | ICD-10-CM | POA: Diagnosis not present

## 2019-06-17 DIAGNOSIS — B3789 Other sites of candidiasis: Secondary | ICD-10-CM | POA: Diagnosis not present

## 2019-06-17 DIAGNOSIS — Z9049 Acquired absence of other specified parts of digestive tract: Secondary | ICD-10-CM | POA: Diagnosis not present

## 2019-06-17 DIAGNOSIS — M810 Age-related osteoporosis without current pathological fracture: Secondary | ICD-10-CM | POA: Diagnosis not present

## 2019-06-17 DIAGNOSIS — N185 Chronic kidney disease, stage 5: Secondary | ICD-10-CM | POA: Diagnosis not present

## 2019-06-17 DIAGNOSIS — Z90710 Acquired absence of both cervix and uterus: Secondary | ICD-10-CM | POA: Diagnosis not present

## 2019-06-17 DIAGNOSIS — B181 Chronic viral hepatitis B without delta-agent: Secondary | ICD-10-CM | POA: Diagnosis not present

## 2019-06-17 DIAGNOSIS — Z6841 Body Mass Index (BMI) 40.0 and over, adult: Secondary | ICD-10-CM | POA: Diagnosis not present

## 2019-06-17 DIAGNOSIS — K219 Gastro-esophageal reflux disease without esophagitis: Secondary | ICD-10-CM | POA: Diagnosis not present

## 2019-06-17 DIAGNOSIS — D631 Anemia in chronic kidney disease: Secondary | ICD-10-CM | POA: Diagnosis not present

## 2019-06-17 DIAGNOSIS — Z8701 Personal history of pneumonia (recurrent): Secondary | ICD-10-CM | POA: Diagnosis not present

## 2019-06-17 DIAGNOSIS — Z96698 Presence of other orthopedic joint implants: Secondary | ICD-10-CM | POA: Diagnosis not present

## 2019-06-17 DIAGNOSIS — M1990 Unspecified osteoarthritis, unspecified site: Secondary | ICD-10-CM | POA: Diagnosis not present

## 2019-06-17 DIAGNOSIS — M87051 Idiopathic aseptic necrosis of right femur: Secondary | ICD-10-CM | POA: Diagnosis not present

## 2019-06-17 DIAGNOSIS — I129 Hypertensive chronic kidney disease with stage 1 through stage 4 chronic kidney disease, or unspecified chronic kidney disease: Secondary | ICD-10-CM | POA: Diagnosis not present

## 2019-06-19 ENCOUNTER — Telehealth: Payer: Self-pay | Admitting: Primary Care

## 2019-06-19 NOTE — Telephone Encounter (Signed)
Sent to PCP for notification only. Nat Christen, CMA

## 2019-06-19 NOTE — Telephone Encounter (Signed)
Noted  

## 2019-06-22 DIAGNOSIS — M87051 Idiopathic aseptic necrosis of right femur: Secondary | ICD-10-CM | POA: Diagnosis not present

## 2019-06-22 DIAGNOSIS — D631 Anemia in chronic kidney disease: Secondary | ICD-10-CM | POA: Diagnosis not present

## 2019-06-22 DIAGNOSIS — I129 Hypertensive chronic kidney disease with stage 1 through stage 4 chronic kidney disease, or unspecified chronic kidney disease: Secondary | ICD-10-CM | POA: Diagnosis not present

## 2019-06-22 DIAGNOSIS — M1990 Unspecified osteoarthritis, unspecified site: Secondary | ICD-10-CM | POA: Diagnosis not present

## 2019-06-22 DIAGNOSIS — N185 Chronic kidney disease, stage 5: Secondary | ICD-10-CM | POA: Diagnosis not present

## 2019-07-12 ENCOUNTER — Other Ambulatory Visit: Payer: Self-pay

## 2019-07-12 NOTE — Patient Outreach (Signed)
[place] [facility]) Care Management  07/12/2019  [name] 08-25-1956 [id number]    3rd unsuccessful outreach to the patient.  No answer.  Phone rang seven times with no voicemail pick up.   Plan: RN Health Coach will make another outreach attempt to the patient within thirty business days.  Lazaro Arms RN, BSN, [place] Direct Dial: [phone]  [phone]

## 2019-07-13 ENCOUNTER — Ambulatory Visit: Payer: Self-pay

## 2019-07-30 IMAGING — CR DG KNEE COMPLETE 4+V*L*
4 series · 4 of 4 positions shown · non-contrast
Comparison: None.

CLINICAL DATA: Pt states she accidentally caught her right leg in
her electric wheelchair today. C/o pain to medial and lateral
malleolus of right ankle. Also c/o pain to right lower tib/fib and
anterior left knee. Pt also c/o chronic bilateral hip pain, h/o AVN.

EXAM:
LEFT KNEE - COMPLETE 4+ VIEW

[x knee lat left]
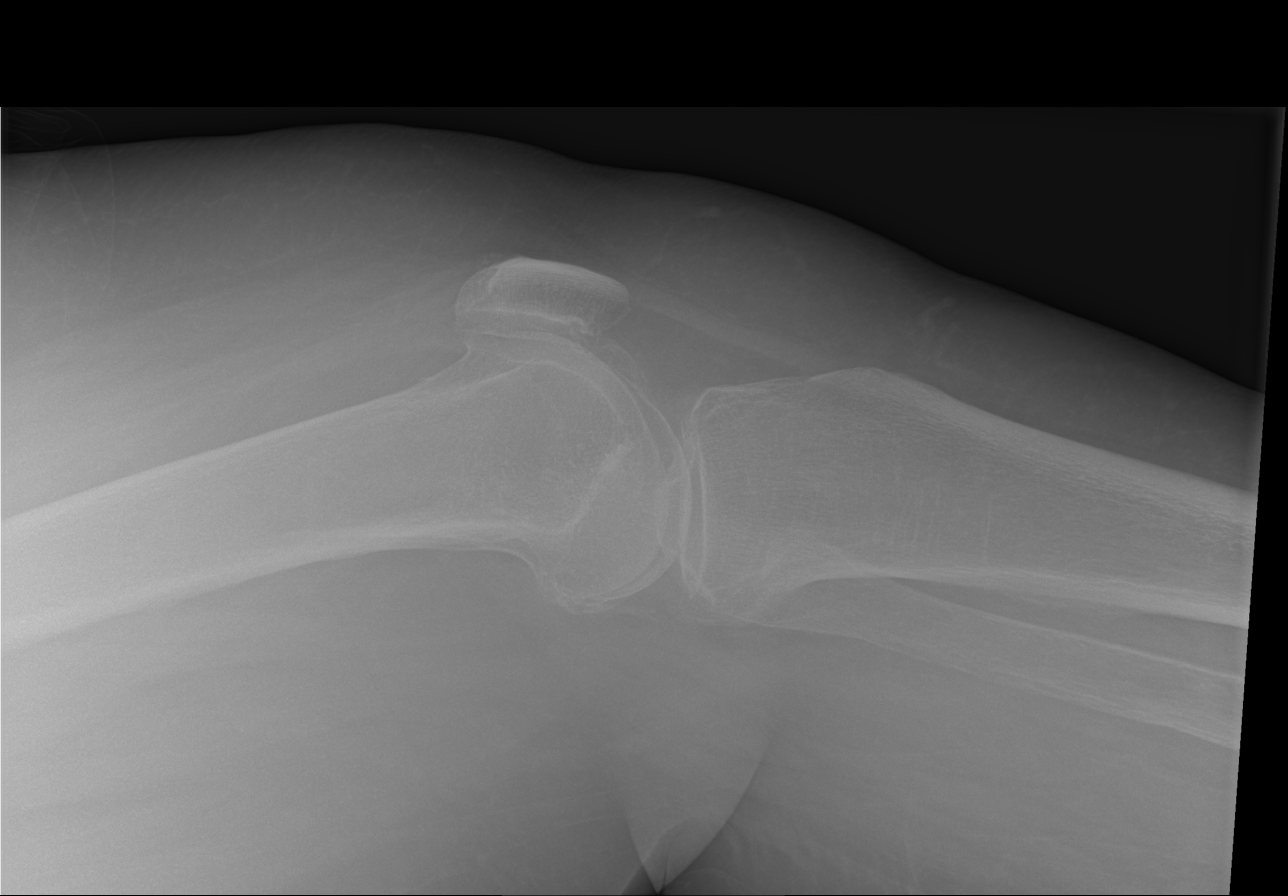

[x knee ap left (1 of 3)]
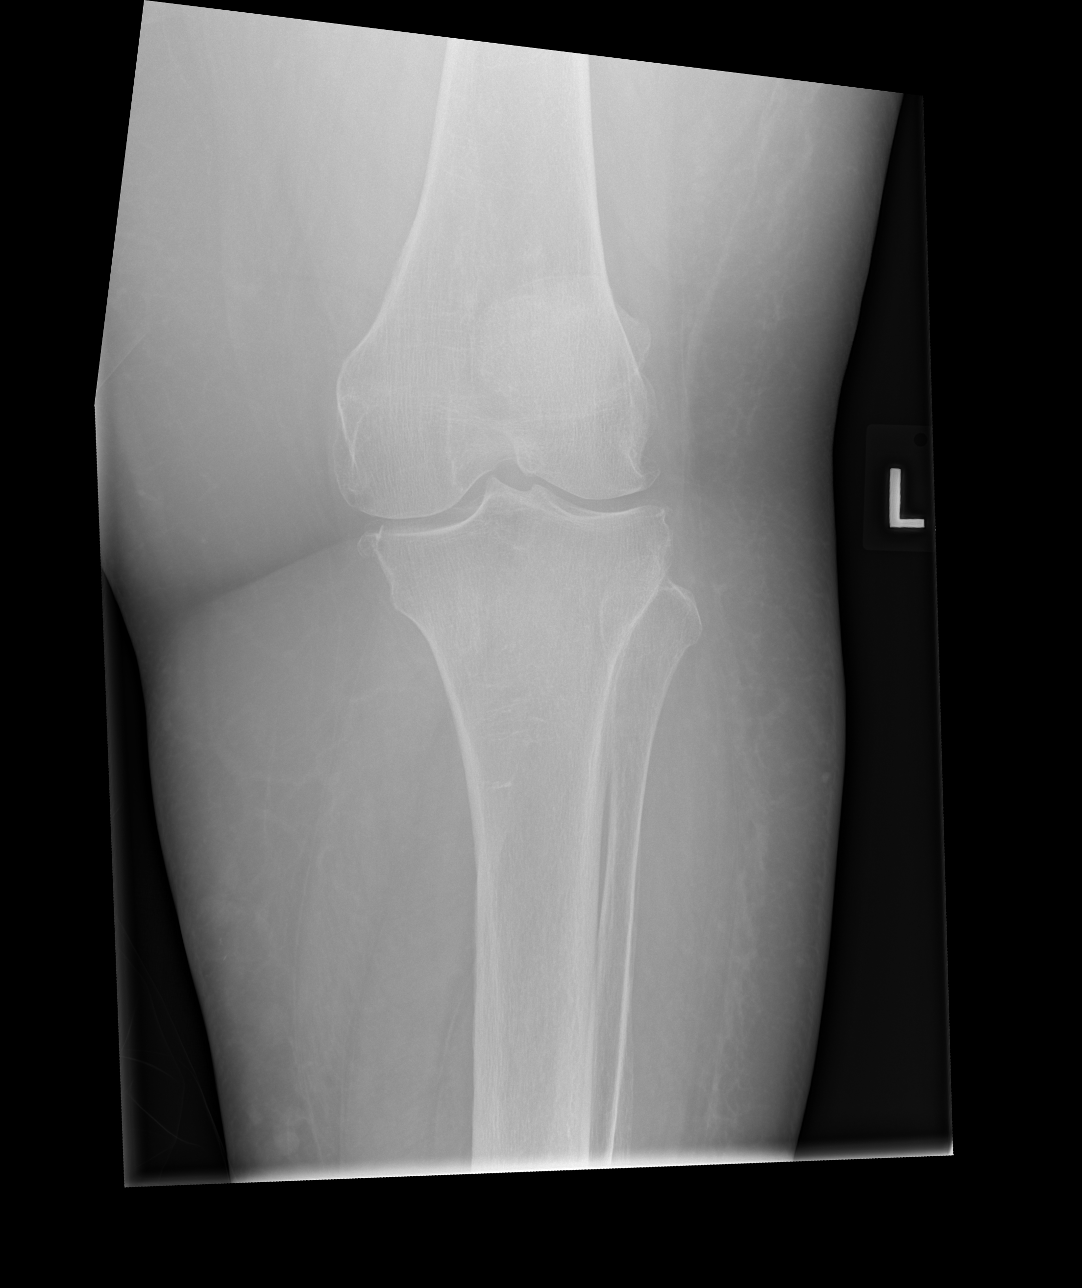

[x knee ap left (2 of 3)]
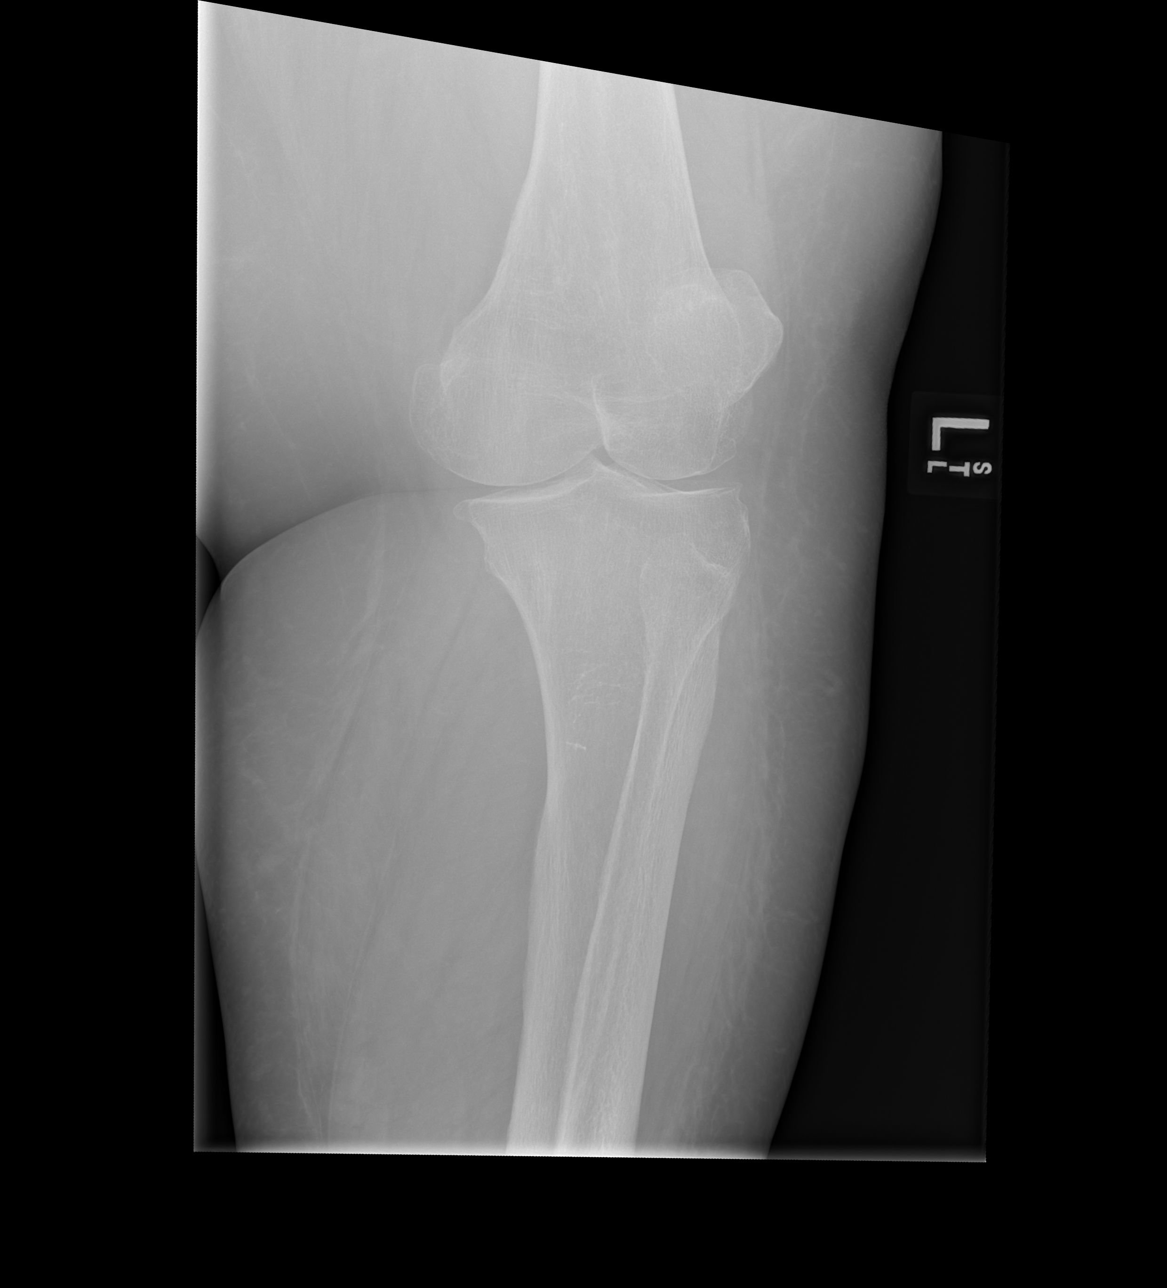

[x knee ap left (3 of 3)]
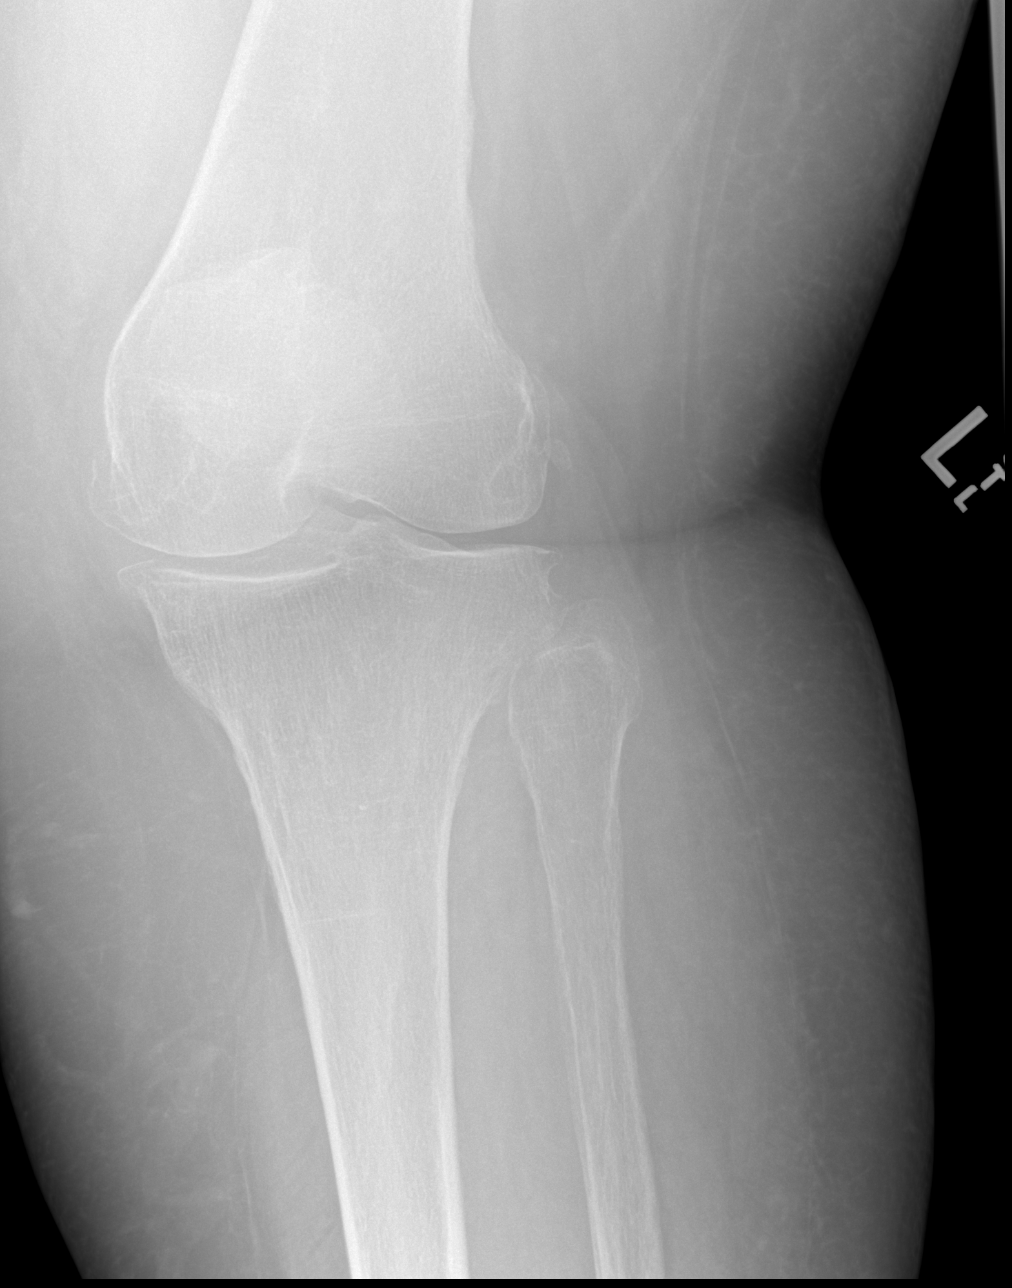

[4 of 4 positions shown; findings below may reference images not displayed]

FINDINGS: There are tricompartmental degenerative changes in the knee. No
acute fracture. No joint effusion. No suspicious lytic or blastic
lesions are identified.
IMPRESSION: Degenerative changes.  No evidence for acute  abnormality.

## 2019-08-10 ENCOUNTER — Other Ambulatory Visit: Payer: Self-pay | Admitting: *Deleted

## 2019-08-10 NOTE — Patient Outreach (Signed)
[facility] [place] [facility]) Care Management  08/10/2019  [name] 06/04/1956 [id number]   RN Health Coach Initial Assessment  Referral Date:  04/20/2019 Referral Source:  Transfer from [place] Reason for Referral:  Continued Disease Management Education Insurance:  Medicare   Outreach Attempt:  Outreach attempt #4 to patient for introduction and initial telephone assessment.  Patient answered and verified HIPAA.  RN Health Coach introduced self and role.  Patient verbally agrees to Disease Management outreaches.  States she in unable to complete initial telephone assessment today and request telephone call back at later date.  Plan:  RN Health Coach will make another telephone outreach to patient within the month of December per patient's request.   Wendy Azure RN [place] [phone] [name]

## 2019-08-11 ENCOUNTER — Ambulatory Visit: Payer: Self-pay

## 2019-08-23 ENCOUNTER — Other Ambulatory Visit: Payer: Self-pay | Admitting: *Deleted

## 2019-08-23 ENCOUNTER — Encounter: Payer: Self-pay | Admitting: *Deleted

## 2019-08-23 NOTE — Patient Outreach (Signed)
[facility]) Care Management  [place]  08/23/2019   [name] [birth year] [id number]    RN Health Coach Initial Assessment   Referral Date:  04/20/2019 Referral Source:  Transfer from [place] Reason for Referral:  Continued Disease Management Education Insurance:  Medicare   Outreach Attempt:  Successful telephone outreach to patient for initial telephone assessment.  HIPAA verified with patient.  Patient completed initial telephone assessment.  Social:  Patient lives at home with husband/significant other of over 20 years.  Reports needing assistance with ADLs of bathing and dressing and being dependent for IADLs of cooking and cleaning.  States she is mostly wheelchair bound but walks very short distances using a rolling walker.  Reports 1 fall in the last year where her wheelchair flipped over and she "broke right leg and ankle".  Unable to have surgery reports her foot and leg are still deformed making it hard for her to stand.  Patient reporting her husband previously transported to medical appointments, but currently their transport Lucianne Lei with the wheelchair lift is not working.  Has been approved for [facility], but patient stating on last transport took too long and missed appointment.  Discussed virtual appointments at this time during the pandemic.  Conditions:  Per chart review and discussion with patient, PMH include but not limited to:  Anemia, arthritis, avascular necrosis of the bone leading to decreased mobility, chronic kidney disease, depression, GERD, hepatitis B and C, hypothyroidism, osteoporosis, continues to smoke, and knee surgery.  Patient has documented history of hypertension in her chart, but patient denies being told she has hypertension.  States she is on hypertensive medication that was initially to treat her "hip necrosis".  Verbalizes she has been on the medication for a long period of time and the physician states she "needs  to continue to take medication".  Discussed possible hypertension if she discontinues medication and current need for medication to prevent hypertensive episodes.  States she monitors her blood pressures about 3 times a month.  Smoking cessation reviewed and discussed and encouraged.  Discussed skin care and monitoring for skin breakdown on pressure sites from wheelchair use.  Medications:  Patient reports taking about 5 medications a day.  Manages medications herself without difficulties.  Denies any issues with affording medications at this time.  Encounter Medications:  Outpatient Encounter Medications as of 08/23/2019  Medication Sig Note  . alendronate (FOSAMAX) 70 MG tablet Take 1 tablet (70 mg total) by mouth once a week. Take with a full glass of water on an empty stomach.   . Calcium Carbonate-Vit D-Min (CALTRATE 600+D PLUS MINERALS) 600-800 MG-UNIT TABS Take 2 tablets by mouth daily.   . clotrimazole-betamethasone (LOTRISONE) cream Apply 1 application topically 2 (two) times daily. (Patient taking differently: Apply 1 application topically 2 (two) times daily as needed (under breast). )   . diclofenac sodium (VOLTAREN) 1 % GEL Apply 2 g topically 4 (four) times daily as needed (pain).   Marland Kitchen diphenoxylate-atropine (LOMOTIL) 2.5-0.025 MG tablet TAKE 1 TABLET BY MOUTH FOUR TIMES A DAY AS NEEDED FOR DIARRHEA OR LOOSE STOOLS (Patient taking differently: Take 1 tablet by mouth 4 (four) times daily as needed for diarrhea or loose stools. )   . escitalopram (LEXAPRO) 10 MG tablet Take 1 tablet (10 mg total) by mouth daily. Needs to keep appt in June for more refills.   Marland Kitchen levothyroxine (SYNTHROID) 75 MCG tablet Take 1 tablet (75 mcg total) by mouth daily.   Marland Kitchen  lisinopril (ZESTRIL) 40 MG tablet Take 1 tablet (40 mg total) by mouth at bedtime.   Marland Kitchen omeprazole (PRILOSEC) 40 MG capsule Take 1 capsule (40 mg total) by mouth daily. (Patient taking differently: Take 40 mg by mouth daily as needed (acid reflux).  )   . pravastatin (PRAVACHOL) 40 MG tablet Take 1 tablet (40 mg total) by mouth daily.   Marland Kitchen amLODipine (NORVASC) 2.5 MG tablet TAKE 1 TABLET (2.5 MG TOTAL) BY MOUTH DAILY. (Patient not taking: Reported on 03/17/2019)   . mupirocin ointment (BACTROBAN) 2 % Apply 1 application topically 2 (two) times daily.   . Vitamin D, Ergocalciferol, (DRISDOL) 1.25 MG (50000 UT) CAPS capsule Take 1 capsule (50,000 Units total) by mouth every 7 (seven) days. (Patient not taking: Reported on 08/23/2019) 08/23/2019: Patient completed this medication as prescribed   No facility-administered encounter medications on file as of 08/23/2019.    Functional Status:  In your present state of health, do you have any difficulty performing the following activities: 08/23/2019 03/17/2019  Hearing? N N  Vision? N N  Difficulty concentrating or making decisions? N N  Walking or climbing stairs? Tempie Donning  Comment wheelchair bound able to stand only. uses w/c at home. HH PT ordered  Dressing or bathing? Tempie Donning  Comment husband assist with bathing and dressing member's husband Liberty Handy assists  Doing errands, shopping? Y Y  Comment wheelchair bound, family does shopping member's husband Liberty Handy assists  Conservation officer, nature and eating ? N Y  Comment - member's significant other Liberty Handy assists  Using the Toilet? Y Y  Comment uses bedside commode member's significant other Liberty Handy assists  In the past six months, have you accidently leaked urine? Y Y  Comment urinary incontinence related to difficulty getting to [facility] member's significant other Liberty Handy assists  Do you have problems with loss of bowel control? N Y  Comment - member's significant other Liberty Handy assists  Managing your Medications? N Y  Comment - member's significant other Liberty Handy assists  Managing your Finances? N Y  Comment - member's significant other Engineer, production or managing your Housekeeping? Tempie Donning  Comment husband does cooking and cleaning  member's significant other Liberty Handy assists  Some recent data might be hidden    Fall/Depression Screening: Fall Risk  08/23/2019 06/05/2019 03/17/2019  Falls in the past year? 1 0 1  Number falls in past yr: 0 - 1  Comment fell in July 2020 and hurt right foot and leg - -  Injury with Fall? 1 - 1  Risk for fall due to : History of fall(s);Impaired balance/gait;Impaired mobility;Impaired vision;Medication side effect - History of fall(s);Impaired mobility;Impaired balance/gait;Medication side effect  Risk for fall due to: Comment - - one leg shorter than the other  Follow up Falls evaluation completed;Education provided;Falls prevention discussed - Falls evaluation completed;Education provided;Falls prevention discussed  Comment - - home health PT ordered   [facility] 2/9 Scores 08/23/2019 06/05/2019 03/17/2019 03/16/2019 03/16/2019 08/15/2018 03/24/2018  PHQ - 2 Score 0 0 3 4 0 4 2  PHQ- 9 Score - - 10 13 - 13 7   [facility] CM Care Plan Problem One     Most Recent Value  Care Plan Problem One  Knowledge deficiet related to diagnosis of hypertension and self care management  Role Documenting the Problem One  Health [place] for Problem One  Active  [facility] Long Term Goal   Patient will report no  emergency room visits in the next 90 days.  [facility] Long Term Goal Start Date  08/23/19  Interventions for Problem One Long Term Goal  Care plan and goals reviewed and discussed, reviewed medications and indications and encouraged medication compliance, discussed diagnosis of hypertension and monitoring blood pressures, discussed and reviewed signs and symptoms of hypertension, encouraged to arrange follow up with primary care provider for virtual visit, discussed transportation to medical appointments and use of [facility], encouraged increased activity as tolerated, sending A Matter of Choice Blood Pressure Control Educational Booklet     Appointments:  Patient last attended appointment with primary care provider,  Juluis Mire NP on 06/05/2019.  Encouraged patient to schedule follow up appointment.  Discussed virtual visits and encouraged patient to discuss follow up with primary care.  Advanced Directives:  Reports having a Living Will in place and does not wish to make changes at this time.   Consent:  [facility] services reviewed and discussed.  Patient verbally agrees to Disease Management outreaches.  Plan: RN Health Coach will send primary MD barriers letter. RN Health Coach will route initial telephone assessment note to primary MD. [place] will send patient 2021 Calendar Booklet. RN Health Coach will send patient A [employer] Blood Pressure Control Booklet. RN Health Coach will send patient Coping with Quitting Smoking Education. RN Health Coach will send patient Steps to Quit Smoking Education. RN Health Coach will make next telephone outreach to patient within the month of March and patient agrees to future outreach.  [place] Coach [phone] [email or web address]

## 2019-10-07 ENCOUNTER — Other Ambulatory Visit (INDEPENDENT_AMBULATORY_CARE_PROVIDER_SITE_OTHER): Payer: Self-pay | Admitting: Primary Care

## 2019-10-07 DIAGNOSIS — E785 Hyperlipidemia, unspecified: Secondary | ICD-10-CM

## 2019-10-07 DIAGNOSIS — F17200 Nicotine dependence, unspecified, uncomplicated: Secondary | ICD-10-CM

## 2019-10-07 DIAGNOSIS — IMO0001 Reserved for inherently not codable concepts without codable children: Secondary | ICD-10-CM

## 2019-10-07 DIAGNOSIS — I872 Venous insufficiency (chronic) (peripheral): Secondary | ICD-10-CM

## 2019-10-11 ENCOUNTER — Other Ambulatory Visit (INDEPENDENT_AMBULATORY_CARE_PROVIDER_SITE_OTHER): Payer: Self-pay | Admitting: Primary Care

## 2019-10-11 ENCOUNTER — Telehealth (INDEPENDENT_AMBULATORY_CARE_PROVIDER_SITE_OTHER): Payer: Self-pay

## 2019-10-11 DIAGNOSIS — I10 Essential (primary) hypertension: Secondary | ICD-10-CM

## 2019-10-11 NOTE — Telephone Encounter (Signed)
Patient called to make a medication refill for   lisinopril (ZESTRIL) 40 MG tablet   Patient uses   [facility], [place] - [address]  [address], [place] [address]   Please advice [phone]

## 2019-10-12 NOTE — Telephone Encounter (Signed)
Sent to PCP ?

## 2019-10-13 ENCOUNTER — Telehealth (INDEPENDENT_AMBULATORY_CARE_PROVIDER_SITE_OTHER): Payer: Self-pay

## 2019-10-13 NOTE — Telephone Encounter (Signed)
Patient called to report Bp readings so that her lisinopril can be refilled. Patients range of Bp readings is between 124/80 and highest being 148/90. Patient has a nurse from [facility] that does home visit and checks Bp and records in a daily log for patient. Her husband also checks her Bp.  She would also like refill of omeprazole. Please fill if appropriate. Nat Christen, CMA

## 2019-10-16 ENCOUNTER — Other Ambulatory Visit (INDEPENDENT_AMBULATORY_CARE_PROVIDER_SITE_OTHER): Payer: Self-pay | Admitting: Primary Care

## 2019-10-16 DIAGNOSIS — I10 Essential (primary) hypertension: Secondary | ICD-10-CM

## 2019-10-16 DIAGNOSIS — K219 Gastro-esophageal reflux disease without esophagitis: Secondary | ICD-10-CM

## 2019-10-16 MED ORDER — LISINOPRIL 40 MG PO TABS: 40.0000 mg | ORAL_TABLET | Freq: Every day | ORAL | 1 refills | Status: DC

## 2019-10-16 MED ORDER — OMEPRAZOLE 40 MG PO CPDR: 40.0000 mg | DELAYED_RELEASE_CAPSULE | Freq: Every day | ORAL | 1 refills | Status: DC | PRN

## 2019-10-16 NOTE — Telephone Encounter (Signed)
Medication refilled systolic [phone] and diastolic 75-83

## 2019-11-13 ENCOUNTER — Other Ambulatory Visit (INDEPENDENT_AMBULATORY_CARE_PROVIDER_SITE_OTHER): Payer: Self-pay | Admitting: Primary Care

## 2019-11-13 DIAGNOSIS — K219 Gastro-esophageal reflux disease without esophagitis: Secondary | ICD-10-CM

## 2019-11-14 ENCOUNTER — Encounter: Payer: Self-pay | Admitting: *Deleted

## 2019-11-14 ENCOUNTER — Other Ambulatory Visit: Payer: Self-pay | Admitting: *Deleted

## 2019-11-14 NOTE — Patient Outreach (Signed)
[facility]) Care Management  [facility] Care Manager  11/14/2019   [name] [birth year] [id number]   [place] Quarterly Outreach  Referral Date:  04/20/2019 Referral Source:  Transfer from [place] Reason for Referral:  Continued Disease Management Education Insurance:  Medicare   Outreach Attempt:  Successful telephone outreach to patient for follow up.  HIPAA verified with patient.  Patient reports she is having weakness and difficulties with activities due to her ankle/foot.  States she is wanting to have home health therapies to restart to help her with strengthening and potential ways to get in her vehicle, truck (states vehicle is tall and she is unable to get into).  Continues to be wheelchair bound, but does report still able to walk very short distances.  Encouraged patient to contact primary care provider or orthopedic for home health orders.  Asking about COVID vaccine for homebound patients.  Right now patient would be using [facility] as no one in her family able to wheelchair transport. Discussed with patient that since she is able to walk very short distances to ask family member that may have a vehicle that is easier for her to get into to transport to vaccination clinic and/or future medical appointments.  Provided patient with [facility] number and website information.  States she is monitoring blood pressures about once a week and reports they have been ranging 130-140/70-80's.  Denies any sustained hypertensive episodes.  Denies any recent falls.  Encounter Medications:  Outpatient Encounter Medications as of 11/14/2019  Medication Sig Note  . alendronate (FOSAMAX) 70 MG tablet Take 1 tablet (70 mg total) by mouth once a week. Take with a full glass of water on an empty stomach.   . Calcium Carbonate-Vit D-Min (CALTRATE 600+D PLUS MINERALS) 600-800 MG-UNIT TABS Take 2 tablets by mouth daily.   . clotrimazole-betamethasone (LOTRISONE)  cream Apply 1 application topically 2 (two) times daily. (Patient taking differently: Apply 1 application topically 2 (two) times daily as needed (under breast). )   . diclofenac sodium (VOLTAREN) 1 % GEL Apply 2 g topically 4 (four) times daily as needed (pain).   Marland Kitchen diphenoxylate-atropine (LOMOTIL) 2.5-0.025 MG tablet TAKE 1 TABLET BY MOUTH FOUR TIMES A DAY AS NEEDED FOR DIARRHEA OR LOOSE STOOLS (Patient taking differently: Take 1 tablet by mouth 4 (four) times daily as needed for diarrhea or loose stools. )   . escitalopram (LEXAPRO) 10 MG tablet Take 1 tablet (10 mg total) by mouth daily. Needs to keep appt in June for more refills.   Marland Kitchen levothyroxine (SYNTHROID) 75 MCG tablet Take 1 tablet (75 mcg total) by mouth daily.   Marland Kitchen lisinopril (ZESTRIL) 40 MG tablet Take 1 tablet (40 mg total) by mouth at bedtime.   Marland Kitchen omeprazole (PRILOSEC) 40 MG capsule Take 1 capsule (40 mg total) by mouth daily as needed (acid reflux).   . pravastatin (PRAVACHOL) 40 MG tablet TAKE 1 TABLET BY MOUTH EVERY DAY   . amLODipine (NORVASC) 2.5 MG tablet TAKE 1 TABLET (2.5 MG TOTAL) BY MOUTH DAILY. (Patient not taking: Reported on 03/17/2019)   . mupirocin ointment (BACTROBAN) 2 % Apply 1 application topically 2 (two) times daily.   . Vitamin D, Ergocalciferol, (DRISDOL) 1.25 MG (50000 UT) CAPS capsule Take 1 capsule (50,000 Units total) by mouth every 7 (seven) days. (Patient not taking: Reported on 08/23/2019) 08/23/2019: Patient completed this medication as prescribed   No facility-administered encounter medications on file as of 11/14/2019.  Functional Status:  In your present state of health, do you have any difficulty performing the following activities: 08/23/2019 03/17/2019  Hearing? N N  Vision? N N  Difficulty concentrating or making decisions? N N  Walking or climbing stairs? Tempie Donning  Comment wheelchair bound able to stand only. uses w/c at home. HH PT ordered  Dressing or bathing? Tempie Donning  Comment husband assist with  bathing and dressing member's husband Liberty Handy assists  Doing errands, shopping? Y Y  Comment wheelchair bound, family does shopping member's husband Liberty Handy assists  Conservation officer, nature and eating ? N Y  Comment - member's significant other Liberty Handy assists  Using the Toilet? Y Y  Comment uses bedside commode member's significant other Liberty Handy assists  In the past six months, have you accidently leaked urine? Y Y  Comment urinary incontinence related to difficulty getting to [facility] member's significant other Liberty Handy assists  Do you have problems with loss of bowel control? N Y  Comment - member's significant other Liberty Handy assists  Managing your Medications? N Y  Comment - member's significant other Liberty Handy assists  Managing your Finances? N Y  Comment - member's significant other Engineer, production or managing your Housekeeping? Tempie Donning  Comment husband does cooking and cleaning member's significant other Liberty Handy assists  Some recent data might be hidden    Fall/Depression Screening: Fall Risk  11/14/2019 08/23/2019 06/05/2019  Falls in the past year? 1 1 0  Number falls in past yr: 0 0 -  Comment last fall July 2020 fell in July 2020 and hurt right foot and leg -  Injury with Fall? 1 1 -  Risk for fall due to : History of fall(s);Impaired balance/gait;Impaired mobility History of fall(s);Impaired balance/gait;Impaired mobility;Impaired vision;Medication side effect -  Risk for fall due to: Comment - - -  Follow up Falls evaluation completed;Education provided;Falls prevention discussed Falls evaluation completed;Education provided;Falls prevention discussed -  Comment - - -   PHQ 2/9 Scores 08/23/2019 06/05/2019 03/17/2019 03/16/2019 03/16/2019 08/15/2018 03/24/2018  PHQ - 2 Score 0 0 3 4 0 4 2  PHQ- 9 Score - - 10 13 - 13 7   THN CM Care Plan Problem One     Most Recent Value  Care Plan Problem One  Knowledge deficiet related to diagnosis of hypertension and self care  management  Role Documenting the Problem One  Health [place] for Problem One  Active  [facility] Long Term Goal   Patient will report arranging follow up appointment with Primary Care Provider within the next 90 days.  THN Long Term Goal Start Date  11/14/19  THN Long Term Goal Met Date  11/14/19  Interventions for Problem One Long Term Goal  Reviewed and discussed goals and care plan, confirmed patient has not attended appointment (in person or virtual) with PCP since September 2020, discussed importance of follow up appointments, encouraged patient to discuss possible virtual appointment with PCP, reviewed medications and medication indications and encouraged medication compliance, discussed transportation options of [facility] or family/friend with vehicle she can get in, encouraged patient to contact PCP for HHPT/OT referral to help with sregnthening, provided patient with number and website for [place] vaccination clinic, encouraged to continue to monitor blood pressures and to notify provider for sustained elevations     Appointments:  Last attended appointment with primary care provider, Juluis Mire NP on 06/05/2019.  Encouraged patient to contact provider to arrange  follow up appointment, virtual or in person.  Plan: RN Health Coach will send primary care provider quarterly update. RN Health Coach will make next telephone outreach to patient in the month of June and patient agrees to future outreach.  [place] [phone] [email or web address]

## 2019-12-11 ENCOUNTER — Other Ambulatory Visit: Payer: Self-pay | Admitting: Primary Care

## 2019-12-11 DIAGNOSIS — E038 Other specified hypothyroidism: Secondary | ICD-10-CM

## 2019-12-13 ENCOUNTER — Other Ambulatory Visit: Payer: Self-pay | Admitting: Family Medicine

## 2019-12-18 ENCOUNTER — Other Ambulatory Visit: Payer: Self-pay

## 2019-12-18 ENCOUNTER — Other Ambulatory Visit (INDEPENDENT_AMBULATORY_CARE_PROVIDER_SITE_OTHER): Payer: Medicare Other

## 2019-12-18 DIAGNOSIS — E038 Other specified hypothyroidism: Secondary | ICD-10-CM

## 2019-12-19 LAB — TSH+FREE T4
Free T4: 1.21 ng/dL (ref 0.82–1.77)
TSH: 4.84 u[IU]/mL — ABNORMAL HIGH (ref 0.450–4.500)

## 2019-12-21 ENCOUNTER — Other Ambulatory Visit (INDEPENDENT_AMBULATORY_CARE_PROVIDER_SITE_OTHER): Payer: Self-pay | Admitting: Primary Care

## 2019-12-21 DIAGNOSIS — E039 Hypothyroidism, unspecified: Secondary | ICD-10-CM

## 2019-12-21 MED ORDER — LEVOTHYROXINE SODIUM 88 MCG PO TABS: 75.0000 ug | ORAL_TABLET | Freq: Every day | ORAL | 0 refills | Status: DC

## 2020-01-15 ENCOUNTER — Other Ambulatory Visit (INDEPENDENT_AMBULATORY_CARE_PROVIDER_SITE_OTHER): Payer: Self-pay | Admitting: Primary Care

## 2020-01-15 DIAGNOSIS — I10 Essential (primary) hypertension: Secondary | ICD-10-CM

## 2020-02-07 ENCOUNTER — Ambulatory Visit: Payer: Medicare Other | Admitting: *Deleted

## 2020-02-13 ENCOUNTER — Other Ambulatory Visit: Payer: Self-pay | Admitting: *Deleted

## 2020-02-13 NOTE — Patient Outreach (Signed)
[place]) Care Management  02/13/2020  CIEARA STIERWALT May 26, 1956 [id number]   RN Health Coach Quarterly Outreach  Referral Date:04/20/2019 Referral Source:Transfer from [place] Reason for Referral:Continued Disease Management Education Insurance:[employer]   Outreach Attempt:  Successful telephone outreach to patient for confirmation of insurance change to [place].  Patient answered and verified HIPAA.  Does state she has changed her insurance to [facility] and also has changed primary care providers to Dr. Sandi Mariscal at [facility].  Dr. Nancy Fetter is not in network with [facility] providers.  Discussed this with patient and need for case closure at this time as provider is not in network.  Patient stated her understanding.  Encouraged patient to contact [facility] in the future if she happens to change primary care providers and needs arise.  Plan: RN Health Coach will close case based on provider not in network with [facility]. RN Health Coach will send provider Case Closure Letter. RN Health Coach will send patient Case Closure Letter.  [place] [phone] [email or web address]

## 2020-10-18 ENCOUNTER — Other Ambulatory Visit (INDEPENDENT_AMBULATORY_CARE_PROVIDER_SITE_OTHER): Payer: Self-pay | Admitting: Primary Care

## 2020-10-18 DIAGNOSIS — I10 Essential (primary) hypertension: Secondary | ICD-10-CM

## 2020-10-18 MED ORDER — LISINOPRIL 40 MG PO TABS: 40.0000 mg | ORAL_TABLET | Freq: Every day | ORAL | 1 refills | Status: DC

## 2021-03-21 ENCOUNTER — Encounter (HOSPITAL_BASED_OUTPATIENT_CLINIC_OR_DEPARTMENT_OTHER): Payer: Self-pay

## 2021-03-21 DIAGNOSIS — D696 Thrombocytopenia, unspecified: Secondary | ICD-10-CM | POA: Diagnosis present

## 2021-03-21 DIAGNOSIS — N183 Chronic kidney disease, stage 3 unspecified: Secondary | ICD-10-CM | POA: Diagnosis present

## 2021-03-21 DIAGNOSIS — Z96651 Presence of right artificial knee joint: Secondary | ICD-10-CM | POA: Diagnosis present

## 2021-03-21 DIAGNOSIS — E785 Hyperlipidemia, unspecified: Secondary | ICD-10-CM | POA: Diagnosis present

## 2021-03-21 DIAGNOSIS — I480 Paroxysmal atrial fibrillation: Secondary | ICD-10-CM | POA: Diagnosis present

## 2021-03-21 DIAGNOSIS — I130 Hypertensive heart and chronic kidney disease with heart failure and stage 1 through stage 4 chronic kidney disease, or unspecified chronic kidney disease: Secondary | ICD-10-CM | POA: Diagnosis not present

## 2021-03-21 DIAGNOSIS — Z6841 Body Mass Index (BMI) 40.0 and over, adult: Secondary | ICD-10-CM

## 2021-03-21 DIAGNOSIS — I959 Hypotension, unspecified: Secondary | ICD-10-CM | POA: Diagnosis present

## 2021-03-21 DIAGNOSIS — Z885 Allergy status to narcotic agent status: Secondary | ICD-10-CM

## 2021-03-21 DIAGNOSIS — Z888 Allergy status to other drugs, medicaments and biological substances status: Secondary | ICD-10-CM

## 2021-03-21 DIAGNOSIS — K529 Noninfective gastroenteritis and colitis, unspecified: Secondary | ICD-10-CM | POA: Diagnosis not present

## 2021-03-21 DIAGNOSIS — K219 Gastro-esophageal reflux disease without esophagitis: Secondary | ICD-10-CM | POA: Diagnosis present

## 2021-03-21 DIAGNOSIS — B1910 Unspecified viral hepatitis B without hepatic coma: Secondary | ICD-10-CM | POA: Diagnosis present

## 2021-03-21 DIAGNOSIS — M879 Osteonecrosis, unspecified: Secondary | ICD-10-CM | POA: Diagnosis present

## 2021-03-21 DIAGNOSIS — E039 Hypothyroidism, unspecified: Secondary | ICD-10-CM | POA: Diagnosis present

## 2021-03-21 DIAGNOSIS — Z90710 Acquired absence of both cervix and uterus: Secondary | ICD-10-CM

## 2021-03-21 DIAGNOSIS — E876 Hypokalemia: Secondary | ICD-10-CM | POA: Diagnosis not present

## 2021-03-21 DIAGNOSIS — M810 Age-related osteoporosis without current pathological fracture: Secondary | ICD-10-CM | POA: Diagnosis present

## 2021-03-21 DIAGNOSIS — E860 Dehydration: Secondary | ICD-10-CM | POA: Diagnosis present

## 2021-03-21 DIAGNOSIS — Z7983 Long term (current) use of bisphosphonates: Secondary | ICD-10-CM

## 2021-03-21 DIAGNOSIS — Z79890 Hormone replacement therapy (postmenopausal): Secondary | ICD-10-CM

## 2021-03-21 DIAGNOSIS — F17210 Nicotine dependence, cigarettes, uncomplicated: Secondary | ICD-10-CM | POA: Diagnosis present

## 2021-03-21 DIAGNOSIS — Z79899 Other long term (current) drug therapy: Secondary | ICD-10-CM

## 2021-03-21 DIAGNOSIS — Z7401 Bed confinement status: Secondary | ICD-10-CM

## 2021-03-21 DIAGNOSIS — I5033 Acute on chronic diastolic (congestive) heart failure: Secondary | ICD-10-CM | POA: Diagnosis present

## 2021-03-21 DIAGNOSIS — Z20822 Contact with and (suspected) exposure to covid-19: Secondary | ICD-10-CM | POA: Diagnosis present

## 2021-03-21 DIAGNOSIS — Z993 Dependence on wheelchair: Secondary | ICD-10-CM

## 2021-03-21 DIAGNOSIS — E278 Other specified disorders of adrenal gland: Secondary | ICD-10-CM | POA: Diagnosis present

## 2021-03-21 DIAGNOSIS — Z880 Allergy status to penicillin: Secondary | ICD-10-CM

## 2021-03-21 DIAGNOSIS — Z8249 Family history of ischemic heart disease and other diseases of the circulatory system: Secondary | ICD-10-CM

## 2021-03-21 DIAGNOSIS — Z9049 Acquired absence of other specified parts of digestive tract: Secondary | ICD-10-CM

## 2021-03-21 DIAGNOSIS — N179 Acute kidney failure, unspecified: Secondary | ICD-10-CM | POA: Diagnosis present

## 2021-03-21 DIAGNOSIS — E872 Acidosis: Secondary | ICD-10-CM | POA: Diagnosis present

## 2021-03-21 DIAGNOSIS — J9621 Acute and chronic respiratory failure with hypoxia: Secondary | ICD-10-CM | POA: Diagnosis present

## 2021-03-21 DIAGNOSIS — E662 Morbid (severe) obesity with alveolar hypoventilation: Secondary | ICD-10-CM | POA: Diagnosis present

## 2021-03-21 DIAGNOSIS — J9622 Acute and chronic respiratory failure with hypercapnia: Secondary | ICD-10-CM | POA: Diagnosis present

## 2021-03-21 DIAGNOSIS — B182 Chronic viral hepatitis C: Secondary | ICD-10-CM | POA: Diagnosis present

## 2021-03-21 DIAGNOSIS — F32A Depression, unspecified: Secondary | ICD-10-CM | POA: Diagnosis present

## 2021-03-21 NOTE — ED Triage Notes (Addendum)
Pt was BIBA for N/V/D x four days. Pt also c/o RLQ abd pain that also started the same time. Pt has not taken anything for sx other than her normal home meds. Pt is wheelchair/bed bound and does not ambulate without assistance.

## 2021-03-22 ENCOUNTER — Emergency Department (HOSPITAL_BASED_OUTPATIENT_CLINIC_OR_DEPARTMENT_OTHER): Payer: Medicare HMO

## 2021-03-22 ENCOUNTER — Inpatient Hospital Stay (HOSPITAL_COMMUNITY): Payer: Medicare HMO

## 2021-03-22 ENCOUNTER — Inpatient Hospital Stay (HOSPITAL_BASED_OUTPATIENT_CLINIC_OR_DEPARTMENT_OTHER)
Admission: EM | Admit: 2021-03-22 | Discharge: 2021-03-27 | DRG: 291 | Disposition: A | Discharge status: Home / Self Care | Payer: Medicare HMO | Attending: Internal Medicine | Admitting: Internal Medicine

## 2021-03-22 ENCOUNTER — Encounter (HOSPITAL_COMMUNITY): Payer: Self-pay | Admitting: Family Medicine

## 2021-03-22 ENCOUNTER — Other Ambulatory Visit: Payer: Self-pay

## 2021-03-22 DIAGNOSIS — J9602 Acute respiratory failure with hypercapnia: Secondary | ICD-10-CM

## 2021-03-22 DIAGNOSIS — E860 Dehydration: Secondary | ICD-10-CM | POA: Diagnosis present

## 2021-03-22 DIAGNOSIS — B1910 Unspecified viral hepatitis B without hepatic coma: Secondary | ICD-10-CM | POA: Diagnosis not present

## 2021-03-22 DIAGNOSIS — I480 Paroxysmal atrial fibrillation: Secondary | ICD-10-CM

## 2021-03-22 DIAGNOSIS — Z20822 Contact with and (suspected) exposure to covid-19: Secondary | ICD-10-CM | POA: Diagnosis not present

## 2021-03-22 DIAGNOSIS — D696 Thrombocytopenia, unspecified: Secondary | ICD-10-CM | POA: Diagnosis not present

## 2021-03-22 DIAGNOSIS — E039 Hypothyroidism, unspecified: Secondary | ICD-10-CM | POA: Diagnosis present

## 2021-03-22 DIAGNOSIS — I5033 Acute on chronic diastolic (congestive) heart failure: Secondary | ICD-10-CM | POA: Diagnosis not present

## 2021-03-22 DIAGNOSIS — I340 Nonrheumatic mitral (valve) insufficiency: Secondary | ICD-10-CM | POA: Diagnosis not present

## 2021-03-22 DIAGNOSIS — I10 Essential (primary) hypertension: Secondary | ICD-10-CM | POA: Diagnosis present

## 2021-03-22 DIAGNOSIS — I4891 Unspecified atrial fibrillation: Secondary | ICD-10-CM | POA: Diagnosis not present

## 2021-03-22 DIAGNOSIS — I130 Hypertensive heart and chronic kidney disease with heart failure and stage 1 through stage 4 chronic kidney disease, or unspecified chronic kidney disease: Secondary | ICD-10-CM | POA: Diagnosis not present

## 2021-03-22 DIAGNOSIS — Z96651 Presence of right artificial knee joint: Secondary | ICD-10-CM | POA: Diagnosis present

## 2021-03-22 DIAGNOSIS — Z6841 Body Mass Index (BMI) 40.0 and over, adult: Secondary | ICD-10-CM | POA: Diagnosis not present

## 2021-03-22 DIAGNOSIS — K219 Gastro-esophageal reflux disease without esophagitis: Secondary | ICD-10-CM | POA: Diagnosis present

## 2021-03-22 DIAGNOSIS — F32A Depression, unspecified: Secondary | ICD-10-CM | POA: Diagnosis present

## 2021-03-22 DIAGNOSIS — N183 Chronic kidney disease, stage 3 unspecified: Secondary | ICD-10-CM | POA: Diagnosis present

## 2021-03-22 DIAGNOSIS — J9621 Acute and chronic respiratory failure with hypoxia: Secondary | ICD-10-CM | POA: Diagnosis not present

## 2021-03-22 DIAGNOSIS — E785 Hyperlipidemia, unspecified: Secondary | ICD-10-CM | POA: Diagnosis present

## 2021-03-22 DIAGNOSIS — B182 Chronic viral hepatitis C: Secondary | ICD-10-CM | POA: Diagnosis present

## 2021-03-22 DIAGNOSIS — F331 Major depressive disorder, recurrent, moderate: Secondary | ICD-10-CM | POA: Diagnosis not present

## 2021-03-22 DIAGNOSIS — R197 Diarrhea, unspecified: Secondary | ICD-10-CM | POA: Diagnosis present

## 2021-03-22 DIAGNOSIS — E278 Other specified disorders of adrenal gland: Secondary | ICD-10-CM | POA: Diagnosis not present

## 2021-03-22 DIAGNOSIS — E872 Acidosis: Secondary | ICD-10-CM

## 2021-03-22 DIAGNOSIS — E8729 Other acidosis: Secondary | ICD-10-CM

## 2021-03-22 DIAGNOSIS — K529 Noninfective gastroenteritis and colitis, unspecified: Secondary | ICD-10-CM | POA: Diagnosis present

## 2021-03-22 DIAGNOSIS — J9622 Acute and chronic respiratory failure with hypercapnia: Secondary | ICD-10-CM | POA: Diagnosis not present

## 2021-03-22 DIAGNOSIS — R609 Edema, unspecified: Secondary | ICD-10-CM | POA: Diagnosis not present

## 2021-03-22 DIAGNOSIS — M879 Osteonecrosis, unspecified: Secondary | ICD-10-CM | POA: Diagnosis present

## 2021-03-22 DIAGNOSIS — E662 Morbid (severe) obesity with alveolar hypoventilation: Secondary | ICD-10-CM | POA: Diagnosis not present

## 2021-03-22 DIAGNOSIS — I959 Hypotension, unspecified: Secondary | ICD-10-CM | POA: Diagnosis not present

## 2021-03-22 DIAGNOSIS — M810 Age-related osteoporosis without current pathological fracture: Secondary | ICD-10-CM | POA: Diagnosis present

## 2021-03-22 DIAGNOSIS — J9601 Acute respiratory failure with hypoxia: Secondary | ICD-10-CM | POA: Diagnosis not present

## 2021-03-22 DIAGNOSIS — N179 Acute kidney failure, unspecified: Secondary | ICD-10-CM | POA: Diagnosis not present

## 2021-03-22 DIAGNOSIS — R0602 Shortness of breath: Secondary | ICD-10-CM | POA: Diagnosis not present

## 2021-03-22 HISTORY — DX: Paroxysmal atrial fibrillation: I48.0

## 2021-03-22 LAB — LACTIC ACID, PLASMA
Lactic Acid, Venous: 1.2 mmol/L (ref 0.5–1.9)
Lactic Acid, Venous: 1.5 mmol/L (ref 0.5–1.9)

## 2021-03-22 LAB — CBC WITH DIFFERENTIAL/PLATELET
Abs Immature Granulocytes: 0.05 10*3/uL (ref 0.00–0.07)
Abs Immature Granulocytes: 0.05 10*3/uL (ref 0.00–0.07)
Basophils Absolute: 0 10*3/uL (ref 0.0–0.1)
Basophils Absolute: 0 10*3/uL (ref 0.0–0.1)
Basophils Relative: 0 %
Basophils Relative: 0 %
Eosinophils Absolute: 0 10*3/uL (ref 0.0–0.5)
Eosinophils Absolute: 0 10*3/uL (ref 0.0–0.5)
Eosinophils Relative: 0 %
Eosinophils Relative: 0 %
HCT: 44.4 % (ref 36.0–46.0)
HCT: 47.3 % — ABNORMAL HIGH (ref 36.0–46.0)
Hemoglobin: 13.7 g/dL (ref 12.0–15.0)
Hemoglobin: 14 g/dL (ref 12.0–15.0)
Immature Granulocytes: 0 %
Immature Granulocytes: 0 %
Lymphocytes Relative: 19 %
Lymphocytes Relative: 13 %
Lymphs Abs: 2.1 10*3/uL (ref 0.7–4.0)
Lymphs Abs: 1.5 10*3/uL (ref 0.7–4.0)
MCH: 27.7 pg (ref 26.0–34.0)
MCH: 28.2 pg (ref 26.0–34.0)
MCHC: 30.9 g/dL (ref 30.0–36.0)
MCHC: 29.6 g/dL — ABNORMAL LOW (ref 30.0–36.0)
MCV: 89.7 fL (ref 80.0–100.0)
MCV: 95.4 fL (ref 80.0–100.0)
Monocytes Absolute: 0.7 10*3/uL (ref 0.1–1.0)
Monocytes Absolute: 0.7 10*3/uL (ref 0.1–1.0)
Monocytes Relative: 6 %
Monocytes Relative: 6 %
Neutro Abs: 8.4 10*3/uL — ABNORMAL HIGH (ref 1.7–7.7)
Neutro Abs: 9.8 10*3/uL — ABNORMAL HIGH (ref 1.7–7.7)
Neutrophils Relative %: 75 %
Neutrophils Relative %: 81 %
Platelets: 171 10*3/uL (ref 150–400)
Platelets: 203 10*3/uL (ref 150–400)
RBC: 4.95 MIL/uL (ref 3.87–5.11)
RBC: 4.96 MIL/uL (ref 3.87–5.11)
RDW: 15.8 % — ABNORMAL HIGH (ref 11.5–15.5)
RDW: 16 % — ABNORMAL HIGH (ref 11.5–15.5)
WBC: 11.3 10*3/uL — ABNORMAL HIGH (ref 4.0–10.5)
WBC: 12.1 10*3/uL — ABNORMAL HIGH (ref 4.0–10.5)
nRBC: 0 % (ref 0.0–0.2)
nRBC: 0 % (ref 0.0–0.2)

## 2021-03-22 LAB — COMPREHENSIVE METABOLIC PANEL
ALT: 11 U/L (ref 0–44)
AST: 14 U/L — ABNORMAL LOW (ref 15–41)
Albumin: 3.5 g/dL (ref 3.5–5.0)
Alkaline Phosphatase: 48 U/L (ref 38–126)
Anion gap: 9 (ref 5–15)
BUN: 12 mg/dL (ref 8–23)
CO2: 29 mmol/L (ref 22–32)
Calcium: 8.8 mg/dL — ABNORMAL LOW (ref 8.9–10.3)
Chloride: 102 mmol/L (ref 98–111)
Creatinine, Ser: 0.75 mg/dL (ref 0.44–1.00)
GFR, Estimated: >60 mL/min (ref >60)
Glucose, Bld: 123 mg/dL — ABNORMAL HIGH (ref 70–99)
Potassium: 3.4 mmol/L — ABNORMAL LOW (ref 3.5–5.1)
Sodium: 140 mmol/L (ref 135–145)
Total Bilirubin: 0.6 mg/dL (ref 0.3–1.2)
Total Protein: 7.3 g/dL (ref 6.5–8.1)

## 2021-03-22 LAB — HEPARIN LEVEL (UNFRACTIONATED): Heparin Unfractionated: 0.2 IU/mL — ABNORMAL LOW (ref 0.30–0.70)

## 2021-03-22 LAB — BASIC METABOLIC PANEL
Anion gap: 8 (ref 5–15)
BUN: 16 mg/dL (ref 8–23)
CO2: 32 mmol/L (ref 22–32)
Calcium: 8.6 mg/dL — ABNORMAL LOW (ref 8.9–10.3)
Chloride: 102 mmol/L (ref 98–111)
Creatinine, Ser: 1.12 mg/dL — ABNORMAL HIGH (ref 0.44–1.00)
GFR, Estimated: 54 mL/min — ABNORMAL LOW (ref >60)
Glucose, Bld: 137 mg/dL — ABNORMAL HIGH (ref 70–99)
Potassium: 3.9 mmol/L (ref 3.5–5.1)
Sodium: 142 mmol/L (ref 135–145)

## 2021-03-22 LAB — I-STAT ARTERIAL BLOOD GAS, ED
Acid-Base Excess: 0 mmol/L (ref 0.0–2.0)
Acid-Base Excess: 0 mmol/L (ref 0.0–2.0)
Bicarbonate: 31.3 mmol/L — ABNORMAL HIGH (ref 20.0–28.0)
Bicarbonate: 30.8 mmol/L — ABNORMAL HIGH (ref 20.0–28.0)
Calcium, Ion: 1.23 mmol/L (ref 1.15–1.40)
Calcium, Ion: 1.24 mmol/L (ref 1.15–1.40)
HCT: 45 % (ref 36.0–46.0)
HCT: 44 % (ref 36.0–46.0)
Hemoglobin: 15.3 g/dL — ABNORMAL HIGH (ref 12.0–15.0)
Hemoglobin: 15 g/dL (ref 12.0–15.0)
O2 Saturation: 83 %
O2 Saturation: 97 %
Patient temperature: 98.5
Patient temperature: 98.6
Potassium: 3.7 mmol/L (ref 3.5–5.1)
Potassium: 3.8 mmol/L (ref 3.5–5.1)
Sodium: 142 mmol/L (ref 135–145)
Sodium: 143 mmol/L (ref 135–145)
TCO2: 34 mmol/L — ABNORMAL HIGH (ref 22–32)
TCO2: 33 mmol/L — ABNORMAL HIGH (ref 22–32)
pCO2 arterial: 81.4 mmHg (ref 32.0–48.0)
pCO2 arterial: 81.8 mmHg (ref 32.0–48.0)
pH, Arterial: 7.192 — CL (ref 7.350–7.450)
pH, Arterial: 7.184 — CL (ref 7.350–7.450)
pO2, Arterial: 60 mmHg — ABNORMAL LOW (ref 83.0–108.0)
pO2, Arterial: 124 mmHg — ABNORMAL HIGH (ref 83.0–108.0)

## 2021-03-22 LAB — URINALYSIS, ROUTINE W REFLEX MICROSCOPIC
Bilirubin Urine: NEGATIVE
Glucose, UA: NEGATIVE mg/dL
Ketones, ur: NEGATIVE mg/dL
Nitrite: NEGATIVE
Protein, ur: 100 mg/dL — AB
Specific Gravity, Urine: 1.017 (ref 1.005–1.030)
pH: 5 (ref 5.0–8.0)

## 2021-03-22 LAB — RESP PANEL BY RT-PCR (FLU A&B, COVID) ARPGX2
Influenza A by PCR: NEGATIVE
Influenza B by PCR: NEGATIVE
SARS Coronavirus 2 by RT PCR: NEGATIVE

## 2021-03-22 LAB — TROPONIN I (HIGH SENSITIVITY)
Troponin I (High Sensitivity): 9 ng/L (ref <18)
Troponin I (High Sensitivity): 10 ng/L (ref <18)
Troponin I (High Sensitivity): 9 ng/L (ref <18)

## 2021-03-22 LAB — MRSA NEXT GEN BY PCR, NASAL: MRSA by PCR Next Gen: NOT DETECTED

## 2021-03-22 LAB — HEPATIC FUNCTION PANEL
ALT: 17 U/L (ref 0–44)
AST: 19 U/L (ref 15–41)
Albumin: 3.4 g/dL — ABNORMAL LOW (ref 3.5–5.0)
Alkaline Phosphatase: 52 U/L (ref 38–126)
Bilirubin, Direct: 0.1 mg/dL (ref 0.0–0.2)
Indirect Bilirubin: 0.7 mg/dL (ref 0.3–0.9)
Total Bilirubin: 0.8 mg/dL (ref 0.3–1.2)
Total Protein: 8 g/dL (ref 6.5–8.1)

## 2021-03-22 LAB — ECHOCARDIOGRAM COMPLETE
Height: 66 in
S' Lateral: 3.3 cm
Weight: 5992.98 oz

## 2021-03-22 LAB — BLOOD GAS, ARTERIAL
Acid-base deficit: 0.9 mmol/L (ref 0.0–2.0)
Bicarbonate: 27.9 mmol/L (ref 20.0–28.0)
FIO2: 50
O2 Saturation: 98.2 %
Patient temperature: 98.6
pCO2 arterial: 67.9 mmHg (ref 32.0–48.0)
pH, Arterial: 7.237 — ABNORMAL LOW (ref 7.350–7.450)
pO2, Arterial: 126 mmHg — ABNORMAL HIGH (ref 83.0–108.0)

## 2021-03-22 LAB — GLUCOSE, CAPILLARY: Glucose-Capillary: 148 mg/dL — ABNORMAL HIGH (ref 70–99)

## 2021-03-22 LAB — LIPASE, BLOOD: Lipase: <10 U/L — ABNORMAL LOW (ref 11–51)

## 2021-03-22 LAB — PROTIME-INR
INR: 1.1 (ref 0.8–1.2)
Prothrombin Time: 14 seconds (ref 11.4–15.2)

## 2021-03-22 LAB — D-DIMER, QUANTITATIVE: D-Dimer, Quant: >20 ug/mL-FEU — ABNORMAL HIGH (ref 0.00–0.50)

## 2021-03-22 LAB — APTT: aPTT: 24 seconds (ref 24–36)

## 2021-03-22 LAB — BRAIN NATRIURETIC PEPTIDE: B Natriuretic Peptide: 158.6 pg/mL — ABNORMAL HIGH (ref 0.0–100.0)

## 2021-03-22 LAB — TSH: TSH: 4.531 u[IU]/mL — ABNORMAL HIGH (ref 0.350–4.500)

## 2021-03-22 MED ORDER — HEPARIN BOLUS VIA INFUSION
1500.0000 [IU] | Freq: Once | INTRAVENOUS | Status: AC
  Administered 2021-03-22: 1500 [IU] via INTRAVENOUS
  Filled 2021-03-22: qty 1500

## 2021-03-22 MED ORDER — CHLORHEXIDINE GLUCONATE 0.12 % MT SOLN
15.0000 mL | Freq: Two times a day (BID) | OROMUCOSAL | Status: DC
  Administered 2021-03-22 – 2021-03-27 (×10): 15 mL via OROMUCOSAL
  Filled 2021-03-22 (×9): qty 15

## 2021-03-22 MED ORDER — ONDANSETRON HCL 4 MG/2ML IJ SOLN
4.0000 mg | Freq: Once | INTRAMUSCULAR | Status: AC
  Administered 2021-03-22: 4 mg via INTRAVENOUS
  Filled 2021-03-22: qty 2

## 2021-03-22 MED ORDER — HYDROMORPHONE HCL 1 MG/ML IJ SOLN
0.5000 mg | Freq: Once | INTRAMUSCULAR | Status: AC
Start: 2021-03-22 — End: 2021-03-22
  Administered 2021-03-22: 0.5 mg via INTRAVENOUS
  Filled 2021-03-22: qty 1

## 2021-03-22 MED ORDER — ACETAMINOPHEN 650 MG RE SUPP: 650.0000 mg | Freq: Four times a day (QID) | RECTAL | Status: DC | PRN

## 2021-03-22 MED ORDER — IOHEXOL 350 MG/ML SOLN: 100.0000 mL | Freq: Once | INTRAVENOUS | Status: DC | PRN

## 2021-03-22 MED ORDER — HEPARIN BOLUS VIA INFUSION
6000.0000 [IU] | Freq: Once | INTRAVENOUS | Status: AC
  Administered 2021-03-22: 6000 [IU] via INTRAVENOUS
  Filled 2021-03-22: qty 6000

## 2021-03-22 MED ORDER — HEPARIN (PORCINE) 25000 UT/250ML-% IV SOLN
2300.0000 [IU]/h | INTRAVENOUS | Status: DC
  Administered 2021-03-22: 1500 [IU]/h via INTRAVENOUS
  Administered 2021-03-23: 1900 [IU]/h via INTRAVENOUS
  Administered 2021-03-24 – 2021-03-25 (×3): 2100 [IU]/h via INTRAVENOUS
  Filled 2021-03-22 (×6): qty 250

## 2021-03-22 MED ORDER — ORAL CARE MOUTH RINSE
15.0000 mL | Freq: Two times a day (BID) | OROMUCOSAL | Status: DC
  Administered 2021-03-23 – 2021-03-27 (×4): 15 mL via OROMUCOSAL

## 2021-03-22 MED ORDER — PERFLUTREN LIPID MICROSPHERE
1.0000 mL | INTRAVENOUS | Status: AC | PRN
  Administered 2021-03-22: 2 mL via INTRAVENOUS
  Filled 2021-03-22: qty 10

## 2021-03-22 MED ORDER — IOHEXOL 300 MG/ML  SOLN: 125.0000 mL | Freq: Once | INTRAMUSCULAR | Status: DC | PRN

## 2021-03-22 MED ORDER — HYDROMORPHONE HCL 1 MG/ML IJ SOLN
0.5000 mg | Freq: Once | INTRAMUSCULAR | Status: AC
  Administered 2021-03-22: 0.5 mg via INTRAVENOUS
  Filled 2021-03-22: qty 1

## 2021-03-22 MED ORDER — DILTIAZEM HCL-DEXTROSE 125-5 MG/125ML-% IV SOLN (PREMIX)
5.0000 mg/h | INTRAVENOUS | Status: DC
  Administered 2021-03-22 (×2): 5 mg/h via INTRAVENOUS
  Filled 2021-03-22 (×2): qty 125

## 2021-03-22 MED ORDER — ACETAMINOPHEN 325 MG PO TABS
650.0000 mg | ORAL_TABLET | Freq: Four times a day (QID) | ORAL | Status: DC | PRN
  Administered 2021-03-22 – 2021-03-24 (×3): 650 mg via ORAL
  Filled 2021-03-22 (×3): qty 2

## 2021-03-22 MED ORDER — SODIUM CHLORIDE 0.9 % IV BOLUS
1000.0000 mL | Freq: Once | INTRAVENOUS | Status: AC
  Administered 2021-03-22: 1000 mL via INTRAVENOUS

## 2021-03-22 MED ORDER — CHLORHEXIDINE GLUCONATE CLOTH 2 % EX PADS
6.0000 | MEDICATED_PAD | Freq: Every day | CUTANEOUS | Status: DC
  Administered 2021-03-22 – 2021-03-27 (×5): 6 via TOPICAL

## 2021-03-22 MED ORDER — DILTIAZEM LOAD VIA INFUSION
10.0000 mg | Freq: Once | INTRAVENOUS | Status: AC
  Administered 2021-03-22: 10 mg via INTRAVENOUS
  Filled 2021-03-22: qty 10

## 2021-03-22 NOTE — ED Notes (Signed)
Patient transported to CT 

## 2021-03-22 NOTE — ED Provider Notes (Signed)
[facility] EMERGENCY DEPT Provider Note   CSN: [id number] Arrival date & time: 03/21/21  2200     History Chief Complaint  Patient presents with   Diarrhea   Abdominal Pain   Nausea    Destina R Salameh is a 65 y.o. female.  Patient is a 65 year old female with past medical history of hypertension, hypothyroidism, hepatitis C, obesity and osteoporosis.  Patient presenting today with complaints of nausea, vomiting, and diarrhea that has worsened over the past 4 days.  She tells me she has not been able to tolerate much p.o. intake at home, stating she is only had water and Ritz crackers for the past 4 days.  She denies any bloody stool or vomit.  She denies fevers or chills.  She does describe some right lower quadrant abdominal pain, but has had both a cholecystectomy and appendectomy in the past.  The history is provided by the patient.  Diarrhea Quality:  Watery Severity:  Moderate Onset quality:  Gradual Duration:  4 days Timing:  Constant Progression:  Worsening Relieved by:  Nothing Worsened by:  Nothing Associated symptoms: abdominal pain   Abdominal Pain Associated symptoms: diarrhea       Past Medical History:  Diagnosis Date   Anemia    Arthritis    AVN (avascular necrosis of bone) ([place]) 04/14/2013   Chronic kidney disease    Complication of anesthesia    Decreased mobility    hx. Rt. hip "avascular necrosis" -unable to weight bear long periods, "using wheelchair"..   Depression    GERD (gastroesophageal reflux disease)    Hep B w/o coma    Hep C w/o coma, chronic ([place])    07-20-14 being presently tx. "Harvoni"-good response per pt. will complete in 7days- Dr. Linus Salmons follows [facility].   Hypertension    Hypothyroid    mild- no medication   Knee stiffness    right knee -hard to bend at knee   Muscle cramps    Obesity    Osteoporosis    Pneumonia    PONV (postoperative nausea and vomiting)    only with Demerol    Shortness of breath dyspnea    still smokes   Superficial thrombophlebitis    left leg   Transfusion history    70's, 80's ? hepatitis C attributed to past transfusions    Patient Active Problem List   Diagnosis Date Noted   Ankle fracture, bimalleolar, closed, right, initial encounter 05/11/2018   Depression with anxiety 03/24/2018   Osteopenia 02/15/2017   Dyslipidemia with high LDL and low HDL 02/01/2017   Morbid obesity ([place]) 02/01/2017   Seborrheic keratoses 01/25/2017   Filiform wart 01/25/2017   Left carpal tunnel syndrome 03/23/2016   Benign neoplasm of transverse colon    Benign neoplasm of descending colon    Benign neoplasm of ascending colon    Gastroesophageal reflux disease with esophagitis    Depression 12/24/2015   Numbness and tingling in left hand 12/24/2015   History of hepatitis C 08/26/2015   Elevated hemoglobin A1c 08/26/2015   Chronic diarrhea 05/28/2015   Benign neoplasm of colon 02/26/2015   Varicose veins of both lower extremities with complications 08/65/7846   Chronic venous insufficiency 01/11/2015   Hepatic cirrhosis ([place]) 08/07/2014   Change in bowel habits 07/19/2014   Essential hypertension, benign 03/19/2014   Smoking 03/19/2014   Numerous moles 03/19/2014   Leg length discrepancy 04/14/2013   HTN (hypertension) 03/28/2013   Vitamin D deficiency  02/02/2012   Degenerative joint disease 02/02/2012   Osteoporosis 02/02/2012   Hepatitis C 02/02/2012   CLAUDICATION 04/29/2009   Hypothyroidism 04/25/2009   Thrombocytopenia ([place]) 04/25/2009    Past Surgical History:  Procedure Laterality Date   ABDOMINAL HYSTERECTOMY     APPENDECTOMY     thinks it was removed with hysterectomy   arthroscopies     knee-right x 7   CHOLECYSTECTOMY     COLON SURGERY     COLONOSCOPY     COLONOSCOPY WITH PROPOFOL N/A 02/26/2015   Procedure: COLONOSCOPY WITH PROPOFOL;  Surgeon: Inda Castle, MD;  Location: [facility] ENDOSCOPY;  Service: Endoscopy;  Laterality:  N/A;   COLONOSCOPY WITH PROPOFOL N/A 03/03/2016   Procedure: COLONOSCOPY WITH PROPOFOL;  Surgeon: Mauri Pole, MD;  Location: [facility] ENDOSCOPY;  Service: Endoscopy;  Laterality: N/A;   ESOPHAGOGASTRODUODENOSCOPY (EGD) WITH PROPOFOL N/A 03/03/2016   Procedure: ESOPHAGOGASTRODUODENOSCOPY (EGD) WITH PROPOFOL;  Surgeon: Mauri Pole, MD;  Location: [facility] ENDOSCOPY;  Service: Endoscopy;  Laterality: N/A;   JOINT REPLACEMENT     right knee   TOTAL KNEE ARTHROPLASTY       OB History   No obstetric history on file.     Family History  Problem Relation Age of Onset   Cancer Mother    Diabetes Mother    Heart disease Mother    Hypertension Mother    Cancer Father    Hypertension Sister    Hypertension Brother    Diabetes Brother    Colon cancer Neg Hx     Social History   Tobacco Use   Smoking status: Every Day    Packs/day: 0.50    Years: 20.00    Pack years: 10.00    Types: Cigarettes   Smokeless tobacco: Never   Tobacco comments:    trying to cut back  Substance Use Topics   Alcohol use: No    Alcohol/week: 0.0 standard drinks   Drug use: No    Home Medications Prior to Admission medications   Medication Sig Start Date End Date Taking? Authorizing Provider  alendronate (FOSAMAX) 70 MG tablet Take 1 tablet (70 mg total) by mouth once a week. Take with a full glass of water on an empty stomach. 06/05/19   Kerin Perna, NP  amLODipine (NORVASC) 2.5 MG tablet TAKE 1 TABLET (2.5 MG TOTAL) BY MOUTH DAILY. Patient not taking: Reported on 03/17/2019 01/06/19   Kerin Perna, NP  Calcium Carbonate-Vit D-Min (CALTRATE 600+D PLUS MINERALS) 600-800 MG-UNIT TABS Take 2 tablets by mouth daily. 06/05/19   Kerin Perna, NP  clotrimazole-betamethasone (LOTRISONE) cream Apply 1 application topically 2 (two) times daily. Patient taking differently: Apply 1 application topically 2 (two) times daily as needed (under breast).  01/24/19   Tereasa Coop, PA-C  diclofenac  sodium (VOLTAREN) 1 % GEL Apply 2 g topically 4 (four) times daily as needed (pain). 08/15/18   Clent Demark, PA-C  diphenoxylate-atropine (LOMOTIL) 2.5-0.025 MG tablet TAKE 1 TABLET BY MOUTH FOUR TIMES A DAY AS NEEDED FOR DIARRHEA OR LOOSE STOOLS Patient taking differently: Take 1 tablet by mouth 4 (four) times daily as needed for diarrhea or loose stools.  08/15/18   Clent Demark, PA-C  escitalopram (LEXAPRO) 10 MG tablet TAKE 1 TABLET BY MOUTH EVERY DAY. Need TO keep appointment in June FOR future refills 12/13/19   Kerin Perna, NP  levothyroxine (SYNTHROID) 88 MCG tablet Take 1 tablet (88 mcg total) by mouth daily. 12/21/19  Kerin Perna, NP  lisinopril (ZESTRIL) 40 MG tablet Take 1 tablet (40 mg total) by mouth at bedtime. 10/18/20   Kerin Perna, NP  mupirocin ointment (BACTROBAN) 2 % Apply 1 application topically 2 (two) times daily. 06/05/19   Kerin Perna, NP  omeprazole (PRILOSEC) 40 MG capsule Take 1 capsule (40 mg total) by mouth daily as needed (acid reflux). 10/16/19   Kerin Perna, NP  pravastatin (PRAVACHOL) 40 MG tablet TAKE 1 TABLET BY MOUTH EVERY DAY 10/09/19   Kerin Perna, NP  Vitamin D, Ergocalciferol, (DRISDOL) 1.25 MG (50000 UT) CAPS capsule Take 1 capsule (50,000 Units total) by mouth every 7 (seven) days. Patient not taking: Reported on 08/23/2019 06/12/19   Kerin Perna, NP    Allergies    Penicillins, Demerol, Meperidine, and Morphine and related  Review of Systems   Review of Systems  Gastrointestinal:  Positive for abdominal pain and diarrhea.  All other systems reviewed and are negative.  Physical Exam Updated Vital Signs BP (!) 128/110 (BP Location: Right Arm)   Pulse 72   Temp 98 F (36.7 C) (Oral)   Resp 20   Ht 5\' 6"  (1.676 m)   Wt (!) 175.5 kg   SpO2 94%   BMI 62.46 kg/m   Physical Exam Vitals and nursing note reviewed.  Constitutional:      General: She is not in acute distress.     Appearance: She is well-developed. She is not diaphoretic.  HENT:     Head: Normocephalic and atraumatic.  Cardiovascular:     Rate and Rhythm: Normal rate and regular rhythm.     Heart sounds: No murmur heard.   No friction rub. No gallop.  Pulmonary:     Effort: Pulmonary effort is normal. No respiratory distress.     Breath sounds: Normal breath sounds. No wheezing.  Abdominal:     General: Bowel sounds are normal. There is no distension.     Palpations: Abdomen is soft.     Tenderness: There is abdominal tenderness in the right lower quadrant. There is no right CVA tenderness, left CVA tenderness, guarding or rebound.  Musculoskeletal:        General: Normal range of motion.     Cervical back: Normal range of motion and neck supple.  Skin:    General: Skin is warm and dry.  Neurological:     General: No focal deficit present.     Mental Status: She is alert and oriented to person, place, and time.    ED Results / Procedures / Treatments   Labs (all labs ordered are listed, but only abnormal results are displayed) Labs Reviewed - No data to display  EKG None  Radiology No results found.  Procedures Procedures   Medications Ordered in ED Medications  sodium chloride 0.9 % bolus 1,000 mL (has no administration in time range)  ondansetron (ZOFRAN) injection 4 mg (has no administration in time range)  HYDROmorphone (DILAUDID) injection 0.5 mg (has no administration in time range)    ED Course  I have reviewed the triage vital signs and the nursing notes.  Pertinent labs & imaging results that were available during my care of the patient were reviewed by me and considered in my medical decision making (see chart for details).    MDM Rules/Calculators/A&P  Patient is a 65 year old female with history of avascular necrosis of the hips, osteoporosis, and multiple other medical issues.  Patient has poor mobility and lives  at home with her husband who is her caretaker.   Patient presenting today with a 4-day history of nausea, vomiting, and diarrhea.  She is also complaining of right-sided abdominal pain.  Work-up initiated including laboratory studies and CT scan of the abdomen and pelvis.  Laboratory studies are essentially unremarkable.  Patient went to radiology for the CT scan, however was unable to tolerate it due to pain in her back and hips.  She was given pain medication for this.  After returning from radiology, patient found to be hypoxic with oxygen saturations dipping down into the 80s and upper 70s.  She required supplemental oxygen at 5 L nasal cannula to maintain her saturations.  ABG obtained showing respiratory acidosis with CO2 retention.  Patient will be started on BiPAP and will require admission.  At this point, I spoke with Dr. Tonie Griffith regarding the situation.  He is requesting cardiac labs and D-dimer be added on.  A troponin, BNP, and dimer currently pending.  Patient to [facility] for admission and further care.  CRITICAL CARE Performed by: Veryl Speak Total critical care time: 45 minutes Critical care time was exclusive of separately billable procedures and treating other patients. Critical care was necessary to treat or prevent imminent or life-threatening deterioration. Critical care was time spent personally by me on the following activities: development of treatment plan with patient and/or surrogate as well as nursing, discussions with consultants, evaluation of patient's response to treatment, examination of patient, obtaining history from patient or surrogate, ordering and performing treatments and interventions, ordering and review of laboratory studies, ordering and review of radiographic studies, pulse oximetry and re-evaluation of patient's condition.   Final Clinical Impression(s) / ED Diagnoses Final diagnoses:  None    Rx / DC Orders ED Discharge Orders     None        Veryl Speak,  MD 03/22/21 [phone]

## 2021-03-22 NOTE — ED Notes (Signed)
Report given to [facility]. 

## 2021-03-22 NOTE — ED Notes (Signed)
Pt is resting well. The BP cuff is giving a more accurate pressure and seems to be trending slowing up from 93 systolic to 929. Pt's Heart rate is close to her goal and is hovering between 106 -115 range. Medication still at initial rate.

## 2021-03-22 NOTE — ED Notes (Signed)
Rt at bedside

## 2021-03-22 NOTE — ED Notes (Signed)
Pt has been running 115-140 rate dose change per orders increased by 5.

## 2021-03-22 NOTE — ED Notes (Signed)
Visually checked on Pt and she stated that she was a little dry but was feeling ok for the moment

## 2021-03-22 NOTE — ED Notes (Signed)
MD notified of Pt's BP trending slightly down. MD responded to bedside for an assessment. Current BP has been taken on right arm. BP cuff replaced with larger cuff on left arm and rechecking BP

## 2021-03-22 NOTE — Progress Notes (Signed)
Pt reports normally voiding frequently, has not voided since admission to ED. Bladder scan attempted x2, both scans showed 0 mL in bladder

## 2021-03-22 NOTE — Progress Notes (Signed)
  Echocardiogram 2D Echocardiogram has been performed.  [name] 03/22/2021, 2:24 PM

## 2021-03-22 NOTE — Progress Notes (Signed)
RT NOTE:  Pt BiPAP machine continuously alarming, RT entered pt room and pt yelling out "get it off, get it off, get it off" while trying to remove the BiPAP mask from her face. RT explained the importance of the pt wearing the BiPAP due to her abnormal ABG levels and pt still continues to state "I want if off right now." RT placed pt on 6L [place] and pt tolerating okay at this time with saturations of 92-95%. RT instructed pt she could stay off for a little while but if she became sleepy then she would need to go back on the machine, pt stated her understanding. RN aware.

## 2021-03-22 NOTE — ED Notes (Signed)
Pt has not urinated since her arrival at the hospital

## 2021-03-22 NOTE — ED Notes (Signed)
Back from CT. Pt stated that she could not breathe when on CT and refused to continue with CT. Pt transported back to room. Pt continues to remain soft and BP and medication is left at current rate Per MD

## 2021-03-22 NOTE — ED Notes (Signed)
ED Provider at bedside. 

## 2021-03-22 NOTE — Progress Notes (Signed)
Attempted CTA for PE, pt was unable to tolerate laying flat for scan despite being on bipap. Returned to ER.

## 2021-03-22 NOTE — ED Notes (Signed)
Some of the Blood pressures coming in are higher due to the fact that the Pt continues to fold her arm together as she reaches upward to constant adjust her mask. Pt is constantly adjusting her mask

## 2021-03-22 NOTE — H&P (Signed)
History and Physical    [name] ION:[id number] DOB: Jan 23, 1956 DOA: 03/22/2021  PCP: Sandi Mariscal, MD  Patient coming from: Home.  Chief Complaint: Diarrhea.  HPI: [name] is a 65 y.o. female with history of hypertension, hyperlipidemia, hypothyroidism presents to the ER with complaints of having diarrhea for the last 3 to 4 days.  Has been having some right lower quadrant abdominal discomfort.  Denies any vomiting fever or chills.  Patient states over the last 3 to 4 days patient also has been having exertional shortness of breath and also has chronic lower extremity edema.  Patient diarrhea is watery no blood in it has not had any recent use of antibiotics, no recent travel, no recent sick contacts or did not have any uncooked food.  ED Course: While in the ER patient was found to be hypoxic and ABG showed hypercapnic respiratory failure and also D-dimer was elevated.  CT angiogram was attempted twice but was unable to do.  Patient was placed on BiPAP for the hypercarbic hypoxic respiratory failure chest x-ray shows congestion.  Patient also was in A. fib with RVR for which patient was started on Cardizem infusion.  Patient admitted for acute hypoxic and hypercapnic respiratory failure with diarrhea with new onset A. fib.  COVID test was negative.  Review of Systems: As per HPI, rest all negative.   Past Medical History:  Diagnosis Date   Anemia    Arthritis    AVN (avascular necrosis of bone) ([place]) 04/14/2013   Chronic kidney disease    Complication of anesthesia    Decreased mobility    hx. Rt. hip "avascular necrosis" -unable to weight bear long periods, "using wheelchair"..   Depression    GERD (gastroesophageal reflux disease)    Hep B w/o coma    Hep C w/o coma, chronic ([place])    07-20-14 being presently tx. "Harvoni"-good response per pt. will complete in 7days- Dr. Linus Salmons follows [place] infectious disease control.   Hypertension    Hypothyroid    mild- no  medication   Knee stiffness    right knee -hard to bend at knee   Muscle cramps    Obesity    Osteoporosis    Pneumonia    PONV (postoperative nausea and vomiting)    only with Demerol   Shortness of breath dyspnea    still smokes   Superficial thrombophlebitis    left leg   Transfusion history    70's, 80's ? hepatitis C attributed to past transfusions    Past Surgical History:  Procedure Laterality Date   ABDOMINAL HYSTERECTOMY     APPENDECTOMY     thinks it was removed with hysterectomy   arthroscopies     knee-right x 7   CHOLECYSTECTOMY     COLON SURGERY     COLONOSCOPY     COLONOSCOPY WITH PROPOFOL N/A 02/26/2015   Procedure: COLONOSCOPY WITH PROPOFOL;  Surgeon: Inda Castle, MD;  Location: [facility];  Service: Endoscopy;  Laterality: N/A;   COLONOSCOPY WITH PROPOFOL N/A 03/03/2016   Procedure: COLONOSCOPY WITH PROPOFOL;  Surgeon: Mauri Pole, MD;  Location: [facility];  Service: Endoscopy;  Laterality: N/A;   ESOPHAGOGASTRODUODENOSCOPY (EGD) WITH PROPOFOL N/A 03/03/2016   Procedure: ESOPHAGOGASTRODUODENOSCOPY (EGD) WITH PROPOFOL;  Surgeon: Mauri Pole, MD;  Location: [facility];  Service: Endoscopy;  Laterality: N/A;   JOINT REPLACEMENT     right knee   TOTAL KNEE ARTHROPLASTY  reports that she has been smoking cigarettes. She has a 10.00 pack-year smoking history. She has never used smokeless tobacco. She reports that she does not drink alcohol and does not use drugs.  Allergies  Allergen Reactions   Penicillins Anaphylaxis and Other (See Comments)    Has patient had a PCN reaction causing immediate rash, facial/tongue/throat swelling, SOB or lightheadedness with hypotension: Yes Has patient had a PCN reaction causing severe rash involving mucus membranes or skin necrosis: No Has patient had a PCN reaction that required hospitalization: Yes Has patient had a PCN reaction occurring within the last 10 years: No If all of the above answers  are "NO", then may proceed with Cephalosporin use.    Demerol Nausea And Vomiting   Meperidine Nausea And Vomiting   Morphine And Related Nausea And Vomiting    Family History  Problem Relation Age of Onset   Cancer Mother    Diabetes Mother    Heart disease Mother    Hypertension Mother    Cancer Father    Hypertension Sister    Hypertension Brother    Diabetes Brother    Colon cancer Neg Hx     Prior to Admission medications   Medication Sig Start Date End Date Taking? Authorizing Provider  alendronate (FOSAMAX) 70 MG tablet Take 1 tablet (70 mg total) by mouth once a week. Take with a full glass of water on an empty stomach. 06/05/19   Kerin Perna, NP  amLODipine (NORVASC) 2.5 MG tablet TAKE 1 TABLET (2.5 MG TOTAL) BY MOUTH DAILY. Patient not taking: Reported on 03/17/2019 01/06/19   Kerin Perna, NP  Calcium Carbonate-Vit D-Min (CALTRATE 600+D PLUS MINERALS) 600-800 MG-UNIT TABS Take 2 tablets by mouth daily. 06/05/19   Kerin Perna, NP  clotrimazole-betamethasone (LOTRISONE) cream Apply 1 application topically 2 (two) times daily. Patient taking differently: Apply 1 application topically 2 (two) times daily as needed (under breast).  01/24/19   Tereasa Coop, PA-C  diclofenac sodium (VOLTAREN) 1 % GEL Apply 2 g topically 4 (four) times daily as needed (pain). 08/15/18   Clent Demark, PA-C  diphenoxylate-atropine (LOMOTIL) 2.5-0.025 MG tablet TAKE 1 TABLET BY MOUTH FOUR TIMES A DAY AS NEEDED FOR DIARRHEA OR LOOSE STOOLS Patient taking differently: Take 1 tablet by mouth 4 (four) times daily as needed for diarrhea or loose stools.  08/15/18   Clent Demark, PA-C  escitalopram (LEXAPRO) 10 MG tablet TAKE 1 TABLET BY MOUTH EVERY DAY. Need TO keep appointment in June FOR future refills 12/13/19   Kerin Perna, NP  levothyroxine (SYNTHROID) 88 MCG tablet Take 1 tablet (88 mcg total) by mouth daily. 12/21/19   Kerin Perna, NP  lisinopril  (ZESTRIL) 40 MG tablet Take 1 tablet (40 mg total) by mouth at bedtime. 10/18/20   Kerin Perna, NP  mupirocin ointment (BACTROBAN) 2 % Apply 1 application topically 2 (two) times daily. 06/05/19   Kerin Perna, NP  omeprazole (PRILOSEC) 40 MG capsule Take 1 capsule (40 mg total) by mouth daily as needed (acid reflux). 10/16/19   Kerin Perna, NP  pravastatin (PRAVACHOL) 40 MG tablet TAKE 1 TABLET BY MOUTH EVERY DAY 10/09/19   Kerin Perna, NP  Vitamin D, Ergocalciferol, (DRISDOL) 1.25 MG (50000 UT) CAPS capsule Take 1 capsule (50,000 Units total) by mouth every 7 (seven) days. Patient not taking: Reported on 08/23/2019 06/12/19   Kerin Perna, NP    Physical Exam: Constitutional: Moderately built  and nourished. Vitals:   03/22/21 1044 03/22/21 1056 03/22/21 1103 03/22/21 1212  BP:  129/78 (!) 92/57   Pulse: (!) 112 (!) 109 (!) 115   Resp: (!) 24 18 (!) 27   Temp:  98.4 F (36.9 C)  98.1 F (36.7 C)  TempSrc:  Axillary  Axillary  SpO2: 100% 100% 100%   Weight:  (!) 169.9 kg    Height:  5\' 6"  (1.676 m)     Eyes: Anicteric no pallor. ENMT: No discharge from the ears eyes nose and mouth. Neck: No mass felt.  No neck rigidity. Respiratory: No rhonchi or crepitations. Cardiovascular: S1-S2 heard. Abdomen: Soft nontender bowel sounds present. Musculoskeletal: Bilateral lower extremity edema present. Skin: Chronic skin changes in lower extremities. Neurologic: Alert awake oriented to time place and person.  Moves all extremities. Psychiatric: Appears normal.  Normal affect.   Labs on Admission: I have personally reviewed following labs and imaging studies  CBC: Recent Labs  Lab 03/22/21 0241 03/22/21 0620 03/22/21 0746  WBC 11.3*  --   --   NEUTROABS 8.4*  --   --   HGB 13.7 15.3* 15.0  HCT 44.4 45.0 44.0  MCV 89.7  --   --   PLT 171  --   --    Basic Metabolic Panel: Recent Labs  Lab 03/22/21 0241 03/22/21 0620 03/22/21 0746  NA 140 142  143  K 3.4* 3.7 3.8  CL 102  --   --   CO2 29  --   --   GLUCOSE 123*  --   --   BUN 12  --   --   CREATININE 0.75  --   --   CALCIUM 8.8*  --   --    GFR: Estimated Creatinine Clearance: 113 mL/min (by C-G formula based on SCr of 0.75 mg/dL). Liver Function Tests: Recent Labs  Lab 03/22/21 0241  AST 14*  ALT 11  ALKPHOS 48  BILITOT 0.6  PROT 7.3  ALBUMIN 3.5   Recent Labs  Lab 03/22/21 0241  LIPASE <10*   No results for input(s): AMMONIA in the last 168 hours. Coagulation Profile: No results for input(s): INR, PROTIME in the last 168 hours. Cardiac Enzymes: No results for input(s): CKTOTAL, CKMB, CKMBINDEX, TROPONINI in the last 168 hours. BNP (last 3 results) No results for input(s): PROBNP in the last 8760 hours. HbA1C: No results for input(s): HGBA1C in the last 72 hours. CBG: No results for input(s): GLUCAP in the last 168 hours. Lipid Profile: No results for input(s): CHOL, HDL, LDLCALC, TRIG, CHOLHDL, LDLDIRECT in the last 72 hours. Thyroid Function Tests: No results for input(s): TSH, T4TOTAL, FREET4, T3FREE, THYROIDAB in the last 72 hours. Anemia Panel: No results for input(s): VITAMINB12, FOLATE, FERRITIN, TIBC, IRON, RETICCTPCT in the last 72 hours. Urine analysis:    Component Value Date/Time   COLORURINE YELLOW 03/11/2009 [place] 03/11/2009 0437   LABSPEC 1.017 03/11/2009 0437   PHURINE 6.0 03/11/2009 0437   GLUCOSEU NEGATIVE 03/11/2009 0437   HGBUR NEGATIVE 03/11/2009 0437   BILIRUBINUR NEGATIVE 03/11/2009 0437   KETONESUR NEGATIVE 03/11/2009 0437   PROTEINUR NEGATIVE 03/11/2009 0437   UROBILINOGEN 1.0 03/11/2009 0437   NITRITE NEGATIVE 03/11/2009 0437   LEUKOCYTESUR NEGATIVE 03/11/2009 0437   Sepsis Labs: @LABRCNTIP (procalcitonin:4,lacticidven:4) ) Recent Results (from the past 240 hour(s))  Resp Panel by RT-PCR (Flu A&B, Covid) Nasopharyngeal Swab     Status: None   Collection Time: 03/22/21  5:20 AM  Specimen:  Nasopharyngeal Swab; Nasopharyngeal(NP) swabs in vial transport medium  Result Value Ref Range Status   SARS Coronavirus 2 by RT PCR NEGATIVE NEGATIVE Final    Comment: (NOTE) SARS-CoV-2 target nucleic acids are NOT DETECTED.  The SARS-CoV-2 RNA is generally detectable in upper respiratory specimens during the acute phase of infection. The lowest concentration of SARS-CoV-2 viral copies this assay can detect is 138 copies/mL. A negative result does not preclude SARS-Cov-2 infection and should not be used as the sole basis for treatment or other patient management decisions. A negative result may occur with  improper specimen collection/handling, submission of specimen other than nasopharyngeal swab, presence of viral mutation(s) within the areas targeted by this assay, and inadequate number of viral copies(<138 copies/mL). A negative result must be combined with clinical observations, patient history, and epidemiological information. The expected result is Negative.  Fact Sheet for Patients:  EntrepreneurPulse.com.au  Fact Sheet for Healthcare Providers:  IncredibleEmployment.be  This test is no t yet approved or cleared by the [place] FDA and  has been authorized for detection and/or diagnosis of SARS-CoV-2 by FDA under an Emergency Use Authorization (EUA). This EUA will remain  in effect (meaning this test can be used) for the duration of the COVID-19 declaration under Section 564(b)(1) of the Act, 21 U.S.C.section 360bbb-3(b)(1), unless the authorization is terminated  or revoked sooner.       Influenza A by PCR NEGATIVE NEGATIVE Final   Influenza B by PCR NEGATIVE NEGATIVE Final    Comment: (NOTE) The Xpert Xpress SARS-CoV-2/FLU/RSV plus assay is intended as an aid in the diagnosis of influenza from Nasopharyngeal swab specimens and should not be used as a sole basis for treatment. Nasal washings and aspirates are unacceptable for  Xpert Xpress SARS-CoV-2/FLU/RSV testing.  Fact Sheet for Patients: EntrepreneurPulse.com.au  Fact Sheet for Healthcare Providers: IncredibleEmployment.be  This test is not yet approved or cleared by the [place] FDA and has been authorized for detection and/or diagnosis of SARS-CoV-2 by FDA under an Emergency Use Authorization (EUA). This EUA will remain in effect (meaning this test can be used) for the duration of the COVID-19 declaration under Section 564(b)(1) of the Act, 21 U.S.C. section 360bbb-3(b)(1), unless the authorization is terminated or revoked.  Performed at [facility], [address] [place], [place] [address]      Radiological Exams on Admission: DG Chest Port 1 View  Result Date: 03/22/2021 CLINICAL DATA:  65 year old female with history of shortness of breath. Nausea, vomiting and diarrhea for the past 4 days. EXAM: PORTABLE CHEST 1 VIEW COMPARISON:  Chest x-ray 03/10/2009. FINDINGS: Lung volumes are normal. Diffuse interstitial prominence and widespread peribronchial cuffing. No confluent consolidative airspace disease. No pleural effusions. No pneumothorax. No definite suspicious appearing pulmonary nodules or masses are noted. Cephalization of the pulmonary vasculature. Cardiomegaly. Upper mediastinal contours are within normal limits. Aortic atherosclerosis. IMPRESSION: 1. Cardiomegaly with pulmonary venous congestion. 2. There is also prominence of the interstitial markings and diffuse peribronchial cuffing. The possibility of some mild interstitial pulmonary edema is not excluded, but not strongly favored. The overall appearance favors bronchitis, but further clinical evaluation is recommended. 3. Aortic atherosclerosis. Electronically Signed   By: Vinnie Langton M.D.   On: 03/22/2021 05:53    EKG: Independently reviewed.  A. fib with RVR.  Assessment/Plan Principal Problem:   Acute respiratory  failure with hypoxia and hypercapnia (HCC) Active Problems:   Hypothyroidism   HTN (hypertension)   Atrial fibrillation with rapid ventricular response (  [place])   Diarrhea    Acute respiratory failure with hypoxia and hypercapnia -cause not clear could be from obesity hypoventilation/sleep apnea probably undiagnosed.  There may be a component of CHF.  However since D-dimer is markedly elevated and patient also is having new onset A. fib with CHA2DS2-VASc score of at least 2 patient is already started on heparin.  Unable to get CT angiogram of the chest because patient unable to lie flat.  If possible we will try to schedule VQ scan and Doppler the lower extremity.  Check 2D echo trend cardiac markers.  Repeat ABG.  Continue BiPAP for now. New onset A. fib with RVR -patient is on Cardizem infusion.  Since patient's CHA2DS2-VASc score is at least 2.  Heparin has been started we will check TSH 2D echo cardiac markers. Diarrhea -denies using any recent antibiotics.  Has had history of chronic diarrhea off and on.  Will check GI pathogen panel. History of hypertension presently blood pressure is on the lower side on Cardizem infusion.  Hold home antihypertensives. Hypothyroidism on Synthroid which we will continue with IV if patient cannot take orally while being on BiPAP.  Check TSH. Hyperlipidemia on statins.  Since patient has acute respiratory failure with hypoxia and hypercapnia with new onset A. fib and diarrhea will need close monitoring for any further worsening inpatient status.   DVT prophylaxis: Heparin. Code Status: Full code. Family Communication: We will try to reach family. Disposition Plan: Home when stable. Consults called: We will consult cardiology.  Will discuss with pulmonology. Admission status: Inpatient.   Rise Patience MD [facility] Pager [phone].  If 7PM-7AM, please contact night-coverage [email or web address] Password [email or web address]  03/22/2021, 12:18 PM

## 2021-03-22 NOTE — ED Notes (Signed)
MD notified of neew Lab Values

## 2021-03-22 NOTE — Progress Notes (Signed)
Pharmacy: [facility]  Patient is a 65 y.o F presented to the ED on 7/16 with c/o diarrhea, nausea , SOB and abdominal pain.  She subsequently went into afib and is currently on heparin drip.   - Ddimer >20 - unable to perform CTA since pt was not able to lay flat for scan  - First heparin level is 0.2 subtherapeutic on 1500 units/hr - no bleeding or interruptions per RN  Goal of Therapy:  Heparin level 0.3-0.7 units/ml Monitor platelets by anticoagulation protocol: Yes  Plan: - bolus heparin 1500 units - increase heparin drip to 1700 units/hr - heparin level in 6 hours - monitor for s/sx bleeding  Dolly Rias RPh 03/22/2021, 9:49 PM

## 2021-03-22 NOTE — ED Provider Notes (Signed)
This patient was examined at change of shift with off going physician Dr. Stark Jock.  This patient is ill-appearing, she has respiratory failure with hypercapnia and acidosis, she is currently on BiPAP, unfortunately she appears to have developed atrial fibrillation with a rapid ventricular rate with a heart rate that is currently irregular tachycardic and approximately 130 with good variation.  The EKG obtained shows short run of nonsustained V. tach of 3 beats.  There is no other signs of ST elevation or depression.  On exam the patient appears to be cognitively slowed, she is answering questions but slowly, she is making sense and seems to be oriented.  We will give her some Cardizem for her atrial fibrillation, thankfully she has a blood pressure that should support this at this time.  We will repeat an ABG to follow her hypercapnia and acidosis   Wendy Chapel, MD 03/22/21 [phone]

## 2021-03-22 NOTE — ED Notes (Signed)
RT obtained ABG on pt with the following results. MD Delo notified of all critical results. Pt placed on NIV/BIPAP w/setting of 15/5 50% and BUR 16. Pt tolerating well, respiratory status is stable on NIV at this time w/no distress noted. RT will continue to monitor.   Results for [name], [name] (MRN [id number]) as of 03/22/2021 06:45  Ref. Range 03/22/2021 06:20  Sample type Unknown ARTERIAL  pH, Arterial Latest Ref Range: 7.350 - 7.450  7.192 (LL)  pCO2 arterial Latest Ref Range: 32.0 - 48.0 mmHg 81.4 (HH)  pO2, Arterial Latest Ref Range: 83.0 - 108.0 mmHg 60 (L)  TCO2 Latest Ref Range: 22 - 32 mmol/L 34 (H)  Acid-Base Excess Latest Ref Range: 0.0 - 2.0 mmol/L 0.0  Bicarbonate Latest Ref Range: 20.0 - 28.0 mmol/L 31.3 (H)  O2 Saturation Latest Units: % 83.0  Patient temperature Unknown 98.5 F  Collection site Unknown Radial

## 2021-03-22 NOTE — ED Notes (Signed)
Unable to get accurate SPO2 on Pt. ED MD and ED RT notified at this time that pt is becoming what appears increasingly cyanotic. Pt has no complaints at this time and states she feels fine but is nodding off shortly after answering questions stating she is tired after the long day she has had and after the long wait in the ED lobby. VBG/ABG requested to have accurate measure of P02 from ED MD/RT.

## 2021-03-22 NOTE — ED Notes (Addendum)
After consulting with MD Rate was titrated back down to original dose based on soft BP. Systolic has been flucuating from 112 - 97.

## 2021-03-22 NOTE — Progress Notes (Signed)
ANTICOAGULATION CONSULT NOTE - Initial Consult  Pharmacy Consult for heparin Indication: atrial fibrillation  Allergies  Allergen Reactions   Penicillins Anaphylaxis and Other (See Comments)    Has patient had a PCN reaction causing immediate rash, facial/tongue/throat swelling, SOB or lightheadedness with hypotension: Yes Has patient had a PCN reaction causing severe rash involving mucus membranes or skin necrosis: No Has patient had a PCN reaction that required hospitalization: Yes Has patient had a PCN reaction occurring within the last 10 years: No If all of the above answers are "NO", then may proceed with Cephalosporin use.    Demerol Nausea And Vomiting   Meperidine Nausea And Vomiting   Morphine And Related Nausea And Vomiting    Patient Measurements: Height: 5\' 6"  (167.6 cm) Weight: (!) 169.9 kg (374 lb 9 oz) IBW/kg (Calculated) : 59.3 Heparin Dosing Weight: 102.9 kg  Vital Signs: Temp: 98.1 F (36.7 C) (07/16 1212) Temp Source: Axillary (07/16 1212) BP: 92/57 (07/16 1103) Pulse Rate: 115 (07/16 1103)  Labs: Recent Labs    03/22/21 0241 03/22/21 0620 03/22/21 0629 03/22/21 0746 03/22/21 0829 03/22/21 1130  HGB 13.7 15.3*  --  15.0  --  14.0  HCT 44.4 45.0  --  44.0  --  47.3*  PLT 171  --   --   --   --  203  CREATININE 0.75  --   --   --   --   --   TROPONINIHS  --   --  9  --  10  --     Estimated Creatinine Clearance: 113 mL/min (by C-G formula based on SCr of 0.75 mg/dL).   Medical History: Past Medical History:  Diagnosis Date   Anemia    Arthritis    AVN (avascular necrosis of bone) ([place]) 04/14/2013   Chronic kidney disease    Complication of anesthesia    Decreased mobility    hx. Rt. hip "avascular necrosis" -unable to weight bear long periods, "using wheelchair"..   Depression    GERD (gastroesophageal reflux disease)    Hep B w/o coma    Hep C w/o coma, chronic ([place])    07-20-14 being presently tx. "Harvoni"-good response per pt. will  complete in 7days- Dr. Linus Salmons follows [place] infectious disease control.   Hypertension    Hypothyroid    mild- no medication   Knee stiffness    right knee -hard to bend at knee   Muscle cramps    Obesity    Osteoporosis    Pneumonia    PONV (postoperative nausea and vomiting)    only with Demerol   Shortness of breath dyspnea    still smokes   Superficial thrombophlebitis    left leg   Transfusion history    70's, 80's ? hepatitis C attributed to past transfusions    Medications:  No anticoagulation prior to admission  Assessment: 65 yo F with history of hypertension, hyperlipidemia, hypothyroidism presented to the ER with complaints of having diarrhea and exertional shortness of breath for the last 3 to 4 days.  Also has chronic lower extremity edema.  Admitted for acute hypoxic and hypercapnic respiratory failure with diarrhea with new onset atrial fibrillation.  D-dimer elevated and CHA2DS2-VASc score of at least 2.  Pharmacy consulted to dose heparin for atrial fibrillation.   Hemoglobin WNL, PLT WNL  Goal of Therapy:  Heparin level 0.3-0.7 units/ml Monitor platelets by anticoagulation protocol: Yes   Plan:  Obtain baseline aPTT and PT/INR Heparin 6,000  units IV bolus then Infuse Heparin 1500 units/hour 6 hour heparin level Monitor daily heparin level, CBC, s/s bleeding   [name], PharmD, [place] Please utilize Amion for appropriate phone number to reach the unit pharmacist ([place]) 03/22/2021 1:12 PM

## 2021-03-22 NOTE — ED Notes (Signed)
RT at bedside.

## 2021-03-23 ENCOUNTER — Inpatient Hospital Stay (HOSPITAL_COMMUNITY): Payer: Medicare HMO

## 2021-03-23 DIAGNOSIS — J9602 Acute respiratory failure with hypercapnia: Secondary | ICD-10-CM | POA: Diagnosis not present

## 2021-03-23 DIAGNOSIS — R0602 Shortness of breath: Secondary | ICD-10-CM

## 2021-03-23 DIAGNOSIS — J9601 Acute respiratory failure with hypoxia: Secondary | ICD-10-CM | POA: Diagnosis not present

## 2021-03-23 DIAGNOSIS — I4891 Unspecified atrial fibrillation: Secondary | ICD-10-CM

## 2021-03-23 DIAGNOSIS — R609 Edema, unspecified: Secondary | ICD-10-CM

## 2021-03-23 DIAGNOSIS — N179 Acute kidney failure, unspecified: Secondary | ICD-10-CM | POA: Diagnosis not present

## 2021-03-23 LAB — BLOOD GAS, ARTERIAL
Acid-base deficit: 0.3 mmol/L (ref 0.0–2.0)
Bicarbonate: 28.8 mmol/L — ABNORMAL HIGH (ref 20.0–28.0)
Drawn by: 560031
FIO2: 44
O2 Content: 6 L/min
O2 Saturation: 98.1 %
Patient temperature: 98.6
pCO2 arterial: 73.7 mmHg (ref 32.0–48.0)
pH, Arterial: 7.216 — ABNORMAL LOW (ref 7.350–7.450)
pO2, Arterial: 119 mmHg — ABNORMAL HIGH (ref 83.0–108.0)

## 2021-03-23 LAB — BASIC METABOLIC PANEL
Anion gap: 9 (ref 5–15)
BUN: 21 mg/dL (ref 8–23)
CO2: 29 mmol/L (ref 22–32)
Calcium: 8.1 mg/dL — ABNORMAL LOW (ref 8.9–10.3)
Chloride: 101 mmol/L (ref 98–111)
Creatinine, Ser: 1.71 mg/dL — ABNORMAL HIGH (ref 0.44–1.00)
GFR, Estimated: 33 mL/min — ABNORMAL LOW (ref >60)
Glucose, Bld: 111 mg/dL — ABNORMAL HIGH (ref 70–99)
Potassium: 4.1 mmol/L (ref 3.5–5.1)
Sodium: 139 mmol/L (ref 135–145)

## 2021-03-23 LAB — CBC
HCT: 42.3 % (ref 36.0–46.0)
Hemoglobin: 12.8 g/dL (ref 12.0–15.0)
MCH: 28.4 pg (ref 26.0–34.0)
MCHC: 30.3 g/dL (ref 30.0–36.0)
MCV: 94 fL (ref 80.0–100.0)
Platelets: 117 10*3/uL — ABNORMAL LOW (ref 150–400)
RBC: 4.5 MIL/uL (ref 3.87–5.11)
RDW: 15.9 % — ABNORMAL HIGH (ref 11.5–15.5)
WBC: 11.9 10*3/uL — ABNORMAL HIGH (ref 4.0–10.5)
nRBC: 0 % (ref 0.0–0.2)

## 2021-03-23 LAB — GLUCOSE, CAPILLARY
Glucose-Capillary: 105 mg/dL — ABNORMAL HIGH (ref 70–99)
Glucose-Capillary: 112 mg/dL — ABNORMAL HIGH (ref 70–99)
Glucose-Capillary: 93 mg/dL (ref 70–99)

## 2021-03-23 LAB — HEPARIN LEVEL (UNFRACTIONATED)
Heparin Unfractionated: 0.24 IU/mL — ABNORMAL LOW (ref 0.30–0.70)
Heparin Unfractionated: 0.4 [IU]/mL (ref 0.30–0.70)
Heparin Unfractionated: 0.49 [IU]/mL (ref 0.30–0.70)

## 2021-03-23 LAB — HIV ANTIBODY (ROUTINE TESTING W REFLEX): HIV Screen 4th Generation wRfx: NONREACTIVE

## 2021-03-23 MED ORDER — LORAZEPAM 2 MG/ML IJ SOLN
1.0000 mg | Freq: Once | INTRAMUSCULAR | Status: AC
  Administered 2021-03-23: 1 mg via INTRAVENOUS
  Filled 2021-03-23: qty 1

## 2021-03-23 MED ORDER — LACTATED RINGERS IV SOLN: INTRAVENOUS | Status: DC

## 2021-03-23 MED ORDER — HEPARIN BOLUS VIA INFUSION
1500.0000 [IU] | Freq: Once | INTRAVENOUS | Status: AC
  Administered 2021-03-23: 1500 [IU] via INTRAVENOUS
  Filled 2021-03-23: qty 1500

## 2021-03-23 MED ORDER — TRAMADOL HCL 50 MG PO TABS
50.0000 mg | ORAL_TABLET | Freq: Four times a day (QID) | ORAL | Status: DC | PRN
  Administered 2021-03-23 – 2021-03-27 (×7): 50 mg via ORAL
  Filled 2021-03-23 (×7): qty 1

## 2021-03-23 NOTE — Progress Notes (Signed)
Critical Blood Gas pH 7.216 pCO2 73.7 Bicarb 28.8.   MD notified. Pt placed on Bipap.

## 2021-03-23 NOTE — Progress Notes (Signed)
Lower extremity venous bilateral study completed.   Please see CV Proc for preliminary results.   [place] Casalino, RDMS, RVT  

## 2021-03-23 NOTE — Progress Notes (Signed)
Patient ID: [name], female   DOB: 23-Aug-1956, 65 y.o.   MRN: [id number]  PROGRESS NOTE    [name]  VEH:[id number] DOB: 25-Apr-1956 DOA: 03/22/2021 PCP: Sandi Mariscal, MD   Brief Narrative:  65 y.o. female with history of hypertension, hyperlipidemia, hypothyroidism presented with diarrhea along with worsening shortness of breath and lower extremity edema.  On presentation, she was found to be hypoxic and ABG showed hypercapnic respiratory failure along with elevated D-dimer.  CT angiogram was attempted twice but she was unable to lie down flat.  She was placed on BiPAP; chest x-ray showed congestion.  She was also found to have A. fib with RVR for which she was started on Cardizem drip and heparin drip.  COVID-19 test was negative.  Assessment & Plan:   Acute respiratory failure with hypoxia and hypercapnia -Possibly from a combination of obesity hypoventilation/sleep apnea probably undiagnosed;?  Component of CHF -CT angiogram was attempted twice on presentation but she was unable to lie down flat.  VQ scan is pending.  COVID-19 test was negative.  Chest x-ray showed congestion on presentation. -Patient was hypercapnic on presentation.  Repeat ABG this morning still shows hypercapnia.  Resume BiPAP. -Spoke to Dr. Gabriel Rung who will see the patient in consultation. -Continue heparin drip.  Paroxysmal A. fib with RVR -Rate better controlled.  Blood pressure on the lower side.  We will wean off Cardizem drip.  Continue heparin drip.  Follow 2D echo and lower extremity duplex.  Diarrhea -Questionable cause.  No diarrhea since admission.  Hypertension -Blood pressure on the lower side.  Wean off Cardizem  Hypothyroidism -On Synthroid at home.  Resume if able to take orally  Leukocytosis -monitor  Thrombocytopenia -Questionable cause.  Monitor.  No signs of bleeding  Hyperlipidemia -we will resume statin once able to take orally.   DVT prophylaxis: Heparin drip Code Status:  Full Family Communication: None at bedside Disposition Plan: Status is: Inpatient  Remains inpatient appropriate because:Inpatient level of care appropriate due to severity of illness  Dispo: The patient is from: Home              Anticipated d/c is to: Home              Patient currently is not medically stable to d/c.   Difficult to place patient No   Consultants: PCCM.  Procedures: None  Antimicrobials: None   Subjective: Patient seen and examined at bedside.  Wakes up slightly, answers some questions slowly but appropriately.  Does not feel well.  Feels short of breath with even minimal exertion.  Denies any current chest pain.  Objective: Vitals:   03/23/21 0900 03/23/21 1000 03/23/21 1100 03/23/21 1142  BP: 99/68  (!) 90/55   Pulse: 77 94 92   Resp:  (!) 22 16   Temp:    97.8 F (36.6 C)  TempSrc:    Axillary  SpO2: 90% 100% 97%   Weight:      Height:        Intake/Output Summary (Last 24 hours) at 03/23/2021 1153 Last data filed at 03/23/2021 1142 Gross per 24 hour  Intake 2212.1 ml  Output 425 ml  Net 1787.1 ml   Filed Weights   03/21/21 2200 03/22/21 1056 03/23/21 0443  Weight: (!) 175.5 kg (!) 169.9 kg (!) 169.9 kg    Examination:  General exam: Looks chronically ill and deconditioned.  No distress. ENT: Currently on BiPAP. Respiratory system: Bilateral decreased breath sounds at bases  with some scattered crackles Cardiovascular system: S1 & S2 heard, Rate controlled Gastrointestinal system: Abdomen is morbidly obese, nondistended, soft and nontender. Normal bowel sounds heard. Extremities: No cyanosis, clubbing; bilateral lower extremity edema present Central nervous system: Wakes up slightly, answers some questions appropriately but very slowly and then falls back to sleep.  No focal neurological deficits. Moving extremities Skin: No rashes, lesions or ulcers Psychiatry: Could not be assessed because of mental status    Data Reviewed: I have  personally reviewed following labs and imaging studies  CBC: Recent Labs  Lab 03/22/21 0241 03/22/21 0620 03/22/21 0746 03/22/21 1130 03/23/21 0454  WBC 11.3*  --   --  12.1* 11.9*  NEUTROABS 8.4*  --   --  9.8*  --   HGB 13.7 15.3* 15.0 14.0 12.8  HCT 44.4 45.0 44.0 47.3* 42.3  MCV 89.7  --   --  95.4 94.0  PLT 171  --   --  203 563*   Basic Metabolic Panel: Recent Labs  Lab 03/22/21 0241 03/22/21 0620 03/22/21 0746 03/22/21 1130 03/23/21 0454  NA 140 142 143 142 139  K 3.4* 3.7 3.8 3.9 4.1  CL 102  --   --  102 101  CO2 29  --   --  32 29  GLUCOSE 123*  --   --  137* 111*  BUN 12  --   --  16 21  CREATININE 0.75  --   --  1.12* 1.71*  CALCIUM 8.8*  --   --  8.6* 8.1*   GFR: Estimated Creatinine Clearance: 52.9 mL/min (A) (by C-G formula based on SCr of 1.71 mg/dL (H)). Liver Function Tests: Recent Labs  Lab 03/22/21 0241 03/22/21 1130  AST 14* 19  ALT 11 17  ALKPHOS 48 52  BILITOT 0.6 0.8  PROT 7.3 8.0  ALBUMIN 3.5 3.4*   Recent Labs  Lab 03/22/21 0241  LIPASE <10*   No results for input(s): AMMONIA in the last 168 hours. Coagulation Profile: Recent Labs  Lab 03/22/21 1418  INR 1.1   Cardiac Enzymes: No results for input(s): CKTOTAL, CKMB, CKMBINDEX, TROPONINI in the last 168 hours. BNP (last 3 results) No results for input(s): PROBNP in the last 8760 hours. HbA1C: No results for input(s): HGBA1C in the last 72 hours. CBG: Recent Labs  Lab 03/22/21 2310 03/23/21 1141  GLUCAP 148* 112*   Lipid Profile: No results for input(s): CHOL, HDL, LDLCALC, TRIG, CHOLHDL, LDLDIRECT in the last 72 hours. Thyroid Function Tests: Recent Labs    03/22/21 1131  TSH 4.531*   Anemia Panel: No results for input(s): VITAMINB12, FOLATE, FERRITIN, TIBC, IRON, RETICCTPCT in the last 72 hours. Sepsis Labs: Recent Labs  Lab 03/22/21 1142 03/22/21 1435  LATICACIDVEN 1.2 1.5    Recent Results (from the past 240 hour(s))  Resp Panel by RT-PCR (Flu A&B,  Covid) Nasopharyngeal Swab     Status: None   Collection Time: 03/22/21  5:20 AM   Specimen: Nasopharyngeal Swab; Nasopharyngeal(NP) swabs in vial transport medium  Result Value Ref Range Status   SARS Coronavirus 2 by RT PCR NEGATIVE NEGATIVE Final    Comment: (NOTE) SARS-CoV-2 target nucleic acids are NOT DETECTED.  The SARS-CoV-2 RNA is generally detectable in upper respiratory specimens during the acute phase of infection. The lowest concentration of SARS-CoV-2 viral copies this assay can detect is 138 copies/mL. A negative result does not preclude SARS-Cov-2 infection and should not be used as the sole basis for treatment or  other patient management decisions. A negative result may occur with  improper specimen collection/handling, submission of specimen other than nasopharyngeal swab, presence of viral mutation(s) within the areas targeted by this assay, and inadequate number of viral copies(<138 copies/mL). A negative result must be combined with clinical observations, patient history, and epidemiological information. The expected result is Negative.  Fact Sheet for Patients:  EntrepreneurPulse.com.au  Fact Sheet for Healthcare Providers:  IncredibleEmployment.be  This test is no t yet approved or cleared by the [place] FDA and  has been authorized for detection and/or diagnosis of SARS-CoV-2 by FDA under an Emergency Use Authorization (EUA). This EUA will remain  in effect (meaning this test can be used) for the duration of the COVID-19 declaration under Section 564(b)(1) of the Act, 21 U.S.C.section 360bbb-3(b)(1), unless the authorization is terminated  or revoked sooner.       Influenza A by PCR NEGATIVE NEGATIVE Final   Influenza B by PCR NEGATIVE NEGATIVE Final    Comment: (NOTE) The Xpert Xpress SARS-CoV-2/FLU/RSV plus assay is intended as an aid in the diagnosis of influenza from Nasopharyngeal swab specimens and should  not be used as a sole basis for treatment. Nasal washings and aspirates are unacceptable for Xpert Xpress SARS-CoV-2/FLU/RSV testing.  Fact Sheet for Patients: EntrepreneurPulse.com.au  Fact Sheet for Healthcare Providers: IncredibleEmployment.be  This test is not yet approved or cleared by the [place] FDA and has been authorized for detection and/or diagnosis of SARS-CoV-2 by FDA under an Emergency Use Authorization (EUA). This EUA will remain in effect (meaning this test can be used) for the duration of the COVID-19 declaration under Section 564(b)(1) of the Act, 21 U.S.C. section 360bbb-3(b)(1), unless the authorization is terminated or revoked.  Performed at [facility], [address] [place], [place] [address]   MRSA Next Gen by PCR, Nasal     Status: None   Collection Time: 03/22/21  2:00 PM   Specimen: Nasal Mucosa; Nasal Swab  Result Value Ref Range Status   MRSA by PCR Next Gen NOT DETECTED NOT DETECTED Final    Comment: (NOTE) The GeneXpert MRSA Assay (FDA approved for NASAL specimens only), is one component of a comprehensive MRSA colonization surveillance program. It is not intended to diagnose MRSA infection nor to guide or monitor treatment for MRSA infections. Test performance is not FDA approved in patients less than 30 years old. Performed at [facility] [address] [place], [place] [address]          Radiology Studies: [facility] Chest [place] 1 View  Result Date: 03/22/2021 CLINICAL DATA:  65 year old female with history of shortness of breath. Nausea, vomiting and diarrhea for the past 4 days. EXAM: PORTABLE CHEST 1 VIEW COMPARISON:  Chest x-ray 03/10/2009. FINDINGS: Lung volumes are normal. Diffuse interstitial prominence and widespread peribronchial cuffing. No confluent consolidative airspace disease. No pleural effusions. No pneumothorax. No definite suspicious appearing  pulmonary nodules or masses are noted. Cephalization of the pulmonary vasculature. Cardiomegaly. Upper mediastinal contours are within normal limits. Aortic atherosclerosis. IMPRESSION: 1. Cardiomegaly with pulmonary venous congestion. 2. There is also prominence of the interstitial markings and diffuse peribronchial cuffing. The possibility of some mild interstitial pulmonary edema is not excluded, but not strongly favored. The overall appearance favors bronchitis, but further clinical evaluation is recommended. 3. Aortic atherosclerosis. Electronically Signed   By: Vinnie Langton M.D.   On: 03/22/2021 05:53   ECHOCARDIOGRAM COMPLETE  Result Date: 03/22/2021    ECHOCARDIOGRAM REPORT  Patient Name:   MAKYNZI EASTLAND Date of Exam: 03/22/2021 Medical Rec #:  [id number]     Height:       66.0 in Accession #:    [id number]    Weight:       374.6 lb Date of Birth:  10-14-1955     BSA:          2.611 m Patient Age:    21 years      BP:           92/57 mmHg Patient Gender: F             HR:           97 bpm. Exam Location:  Inpatient Procedure: 2D Echo, Cardiac Doppler and Color Doppler Indications:    Atrial Fibrillation I48.91  History:        Patient has prior history of Echocardiogram examinations, most                 recent 03/12/2009. Signs/Symptoms:Shortness of Breath; Risk                 Factors:Hypertension and Current Smoker. CKD.  Sonographer:    Vickie Epley RDCS Referring Phys: [place]  Sonographer Comments: Patient is morbidly obese. Patient on Bipap. IMPRESSIONS  1. Left ventricular ejection fraction, by estimation, is 60 to 65%. The left ventricle has normal function. The left ventricle has no regional wall motion abnormalities. Left ventricular diastolic function could not be evaluated.  2. The right ventricle is poorly visualized, but appears to be at least mildly dilated and at least mildly depressed. Right ventricular systolic function is mildly reduced. The right ventricular size is  mildly enlarged. Tricuspid regurgitation signal is  inadequate for assessing PA pressure.  3. The mitral valve is normal in structure. No evidence of mitral valve regurgitation. No evidence of mitral stenosis.  4. The aortic valve is normal in structure. Aortic valve regurgitation is not visualized. No aortic stenosis is present.  5. The inferior vena cava is dilated in size with >50% respiratory variability, suggesting right atrial pressure of 8 mmHg. FINDINGS  Left Ventricle: Left ventricular ejection fraction, by estimation, is 60 to 65%. The left ventricle has normal function. The left ventricle has no regional wall motion abnormalities. The left ventricular internal cavity size was normal in size. There is  no left ventricular hypertrophy. Left ventricular diastolic function could not be evaluated due to atrial fibrillation. Left ventricular diastolic function could not be evaluated. Right Ventricle: The right ventricle is poorly visualized, but appears to be at least mildly dilated and at least mildly depressed. The right ventricular size is mildly enlarged. No increase in right ventricular wall thickness. Right ventricular systolic  function is mildly reduced. Tricuspid regurgitation signal is inadequate for assessing PA pressure. Left Atrium: Left atrial size was normal in size. Right Atrium: Right atrial size was normal in size. Pericardium: There is no evidence of pericardial effusion. Mitral Valve: The mitral valve is normal in structure. Mild mitral annular calcification. No evidence of mitral valve regurgitation. No evidence of mitral valve stenosis. Tricuspid Valve: The tricuspid valve is normal in structure. Tricuspid valve regurgitation is trivial. No evidence of tricuspid stenosis. Aortic Valve: The aortic valve is normal in structure. Aortic valve regurgitation is not visualized. No aortic stenosis is present. Pulmonic Valve: The pulmonic valve was normal in structure. Pulmonic valve regurgitation  is not visualized. No evidence of pulmonic stenosis. Aorta: The aortic root  is normal in size and structure. Venous: The inferior vena cava is dilated in size with greater than 50% respiratory variability, suggesting right atrial pressure of 8 mmHg. IAS/Shunts: No atrial level shunt detected by color flow Doppler.  LEFT VENTRICLE PLAX 2D LVIDd:         4.50 cm LVIDs:         3.30 cm LV PW:         1.00 cm LV IVS:        1.00 cm LVOT diam:     2.10 cm LVOT Area:     3.46 cm  RIGHT VENTRICLE TAPSE (M-mode): 1.5 cm LEFT ATRIUM           Index LA diam:      3.50 cm 1.34 cm/m LA Vol (A4C): 54.3 ml 20.80 ml/m   AORTA Ao Root diam: 3.60 cm Ao Asc diam:  4.00 cm  SHUNTS Systemic Diam: 2.10 cm Dani Gobble Croitoru MD Electronically signed by Sanda Klein MD Signature Date/Time: 03/22/2021/3:14:01 PM    Final    VAS [place] LOWER EXTREMITY VENOUS (DVT)  Result Date: 03/23/2021  Lower Venous DVT Study Patient Name:  AKAIYA TOUCHETTE  Date of Exam:   03/23/2021 Medical Rec #: [id number]      Accession #:    [id number] Date of Birth: Oct 08, 1955      Patient Gender: F Patient Age:   65Y Exam Location:  [facility] Procedure:      VAS [place] LOWER EXTREMITY VENOUS (DVT) Referring Phys: [place] --------------------------------------------------------------------------------  Indications: Edema and SOB.  Anticoagulation: Heparin. Limitations: Today's examination was severely limited due to patient body habitus, pain intolerance, and inability to reposition. Comparison Study: 03-16-2019 Prior bilateral lower extremity venous was limited                   bu negative for DVT. Performing Technologist: Darlin Coco RDMS,RVT  Examination Guidelines: A complete evaluation includes B-mode imaging, spectral Doppler, color Doppler, and power Doppler as needed of all accessible portions of each vessel. Bilateral testing is considered an integral part of a complete examination. Limited examinations for reoccurring indications may  be performed as noted. The reflux portion of the exam is performed with the patient in reverse Trendelenburg.  +---------+---------------+---------+-----------+----------+-------------------+ RIGHT    CompressibilityPhasicitySpontaneityPropertiesThrombus Aging      +---------+---------------+---------+-----------+----------+-------------------+ CFV      Full           Yes      Yes                                      +---------+---------------+---------+-----------+----------+-------------------+ SFJ      Full                                                             +---------+---------------+---------+-----------+----------+-------------------+ FV Prox  Full                                                             +---------+---------------+---------+-----------+----------+-------------------+ FV  Mid   Full           Yes      Yes                                      +---------+---------------+---------+-----------+----------+-------------------+ FV Distal               Yes      Yes                                      +---------+---------------+---------+-----------+----------+-------------------+ PFV      Full                                                             +---------+---------------+---------+-----------+----------+-------------------+ POP      Full                                         Unable to examine                                                         popliteal fossa in                                                        sagittal plane due                                                        to positioning/pain +---------+---------------+---------+-----------+----------+-------------------+ PTV                                                   Not visualized      +---------+---------------+---------+-----------+----------+-------------------+ PERO                                                   Not visualized      +---------+---------------+---------+-----------+----------+-------------------+   +---------+---------------+---------+-----------+----------+--------------+ LEFT     CompressibilityPhasicitySpontaneityPropertiesThrombus Aging +---------+---------------+---------+-----------+----------+--------------+ CFV      Full           Yes      Yes                                 +---------+---------------+---------+-----------+----------+--------------+ SFJ  Full                                                        +---------+---------------+---------+-----------+----------+--------------+ FV Prox  Full                                                        +---------+---------------+---------+-----------+----------+--------------+ FV Mid   Full           Yes      Yes                                 +---------+---------------+---------+-----------+----------+--------------+ FV Distal               Yes      Yes                                 +---------+---------------+---------+-----------+----------+--------------+ PFV      Full                                                        +---------+---------------+---------+-----------+----------+--------------+ POP      Full           Yes      Yes                                 +---------+---------------+---------+-----------+----------+--------------+ PTV                                                   Not visualized +---------+---------------+---------+-----------+----------+--------------+ PERO                                                  Not visualized +---------+---------------+---------+-----------+----------+--------------+    Summary: RIGHT: - There is no evidence of deep vein thrombosis in the lower extremity. However, portions of this examination were limited- see technologist comments above.  LEFT: - There is no evidence of deep vein thrombosis in the lower  extremity. However, portions of this examination were limited- see technologist comments above.  - No cystic structure found in the popliteal fossa.  *See table(s) above for measurements and observations.    Preliminary         Scheduled Meds:  chlorhexidine  15 mL Mouth Rinse BID   Chlorhexidine Gluconate Cloth  6 each Topical Daily   mouth rinse  15 mL Mouth Rinse q12n4p   Continuous Infusions:  diltiazem (CARDIZEM) infusion Stopped (03/23/21 0813)   heparin 1,900 Units/hr (03/23/21 1142)          Audrielle Vankuren Starla Link,  MD [facility] 03/23/2021, 11:53 AM

## 2021-03-23 NOTE — Progress Notes (Signed)
ANTICOAGULATION CONSULT NOTE - follow up  Pharmacy Consult for heparin Indication: atrial fibrillation  Allergies  Allergen Reactions   Penicillins Anaphylaxis and Other (See Comments)    Has patient had a PCN reaction causing immediate rash, facial/tongue/throat swelling, SOB or lightheadedness with hypotension: Yes Has patient had a PCN reaction causing severe rash involving mucus membranes or skin necrosis: No Has patient had a PCN reaction that required hospitalization: Yes Has patient had a PCN reaction occurring within the last 10 years: No If all of the above answers are "NO", then may proceed with Cephalosporin use.    Demerol Nausea And Vomiting   Meperidine Nausea And Vomiting   Morphine And Related Nausea And Vomiting    Patient Measurements: Height: 5\' 6"  (167.6 cm) Weight: (!) 169.9 kg (374 lb 9 oz) IBW/kg (Calculated) : 59.3 Heparin Dosing Weight: 102.9 kg  Vital Signs: Temp: 97.8 F (36.6 C) (07/17 1142) Temp Source: Axillary (07/17 1142) BP: 90/58 (07/17 1200) Pulse Rate: 86 (07/17 1200)  Labs: Recent Labs    03/22/21 0241 03/22/21 0620 03/22/21 0629 03/22/21 0746 03/22/21 0829 03/22/21 1130 03/22/21 1418 03/22/21 2111 03/23/21 0454 03/23/21 1133  HGB 13.7   < >  --  15.0  --  14.0  --   --  12.8  --   HCT 44.4   < >  --  44.0  --  47.3*  --   --  42.3  --   PLT 171  --   --   --   --  203  --   --  117*  --   APTT  --   --   --   --   --   --  24  --   --   --   LABPROT  --   --   --   --   --   --  14.0  --   --   --   INR  --   --   --   --   --   --  1.1  --   --   --   HEPARINUNFRC  --   --   --   --   --   --   --  0.20* 0.24* 0.40  CREATININE 0.75  --   --   --   --  1.12*  --   --  1.71*  --   TROPONINIHS  --   --  9  --  10 9  --   --   --   --    < > = values in this interval not displayed.     Estimated Creatinine Clearance: 52.9 mL/min (A) (by C-G formula based on SCr of 1.71 mg/dL (H)).   Medical History: Past Medical History:   Diagnosis Date   Anemia    Arthritis    AVN (avascular necrosis of bone) ([place]) 04/14/2013   Chronic kidney disease    Complication of anesthesia    Decreased mobility    hx. Rt. hip "avascular necrosis" -unable to weight bear long periods, "using wheelchair"..   Depression    GERD (gastroesophageal reflux disease)    Hep B w/o coma    Hep C w/o coma, chronic ([place])    07-20-14 being presently tx. "Harvoni"-good response per pt. will complete in 7days- Dr. Linus Salmons follows [place] infectious disease control.   Hypertension    Hypothyroid    mild- no medication   Knee stiffness  right knee -hard to bend at knee   Muscle cramps    Obesity    Osteoporosis    Pneumonia    PONV (postoperative nausea and vomiting)    only with Demerol   Shortness of breath dyspnea    still smokes   Superficial thrombophlebitis    left leg   Transfusion history    70's, 80's ? hepatitis C attributed to past transfusions    Medications:  No anticoagulation prior to admission  Assessment: 65 yo F with history of hypertension, hyperlipidemia, hypothyroidism presented to the ER with complaints of having diarrhea and exertional shortness of breath for the last 3 to 4 days.  Also has chronic lower extremity edema.  Admitted for acute hypoxic and hypercapnic respiratory failure with diarrhea with new onset atrial fibrillation.  D-dimer elevated and CHA2DS2-VASc score of at least 2.  Pharmacy consulted to dose heparin for atrial fibrillation.   03/23/2021 HL 0.4 therapeutic on 1900 units/hr Hgb WNL,  Plts down to 117 (42% platelet fall on day 2 of heparin with no known previous exposure, not likely antibody mediated) Per RN no bleeding  Goal of Therapy:  Heparin level 0.3-0.7 units/ml Monitor platelets by anticoagulation protocol: Yes   Plan:  Continue Heparin infusion at 1900 units/hour Confirmatory 6 hour heparin level Monitor daily heparin level, CBC, s/s bleeding   Tiona Ruane P. Legrand Como,  PharmD, [place] Please utilize Amion for appropriate phone number to reach the unit pharmacist ([place]) 03/23/2021 12:50 PM

## 2021-03-23 NOTE — Progress Notes (Signed)
ANTICOAGULATION CONSULT NOTE - follow up  Pharmacy Consult for heparin Indication: atrial fibrillation  Allergies  Allergen Reactions   Penicillins Anaphylaxis and Other (See Comments)    Has patient had a PCN reaction causing immediate rash, facial/tongue/throat swelling, SOB or lightheadedness with hypotension: Yes Has patient had a PCN reaction causing severe rash involving mucus membranes or skin necrosis: No Has patient had a PCN reaction that required hospitalization: Yes Has patient had a PCN reaction occurring within the last 10 years: No If all of the above answers are "NO", then may proceed with Cephalosporin use.    Demerol Nausea And Vomiting   Meperidine Nausea And Vomiting   Morphine And Related Nausea And Vomiting    Patient Measurements: Height: 5\' 6"  (167.6 cm) Weight: (!) 169.9 kg (374 lb 9 oz) IBW/kg (Calculated) : 59.3 Heparin Dosing Weight: 102.9 kg  Vital Signs: Temp: 98.2 F (36.8 C) (07/17 0341) Temp Source: Axillary (07/17 0341) BP: 89/64 (07/17 0400) Pulse Rate: 88 (07/17 0514)  Labs: Recent Labs    03/22/21 0241 03/22/21 0620 03/22/21 0629 03/22/21 0746 03/22/21 0829 03/22/21 1130 03/22/21 1418 03/22/21 2111 03/23/21 0454  HGB 13.7   < >  --  15.0  --  14.0  --   --  12.8  HCT 44.4   < >  --  44.0  --  47.3*  --   --  42.3  PLT 171  --   --   --   --  203  --   --  117*  APTT  --   --   --   --   --   --  24  --   --   LABPROT  --   --   --   --   --   --  14.0  --   --   INR  --   --   --   --   --   --  1.1  --   --   HEPARINUNFRC  --   --   --   --   --   --   --  0.20* 0.24*  CREATININE 0.75  --   --   --   --  1.12*  --   --   --   TROPONINIHS  --   --  9  --  10 9  --   --   --    < > = values in this interval not displayed.     Estimated Creatinine Clearance: 80.7 mL/min (A) (by C-G formula based on SCr of 1.12 mg/dL (H)).   Medical History: Past Medical History:  Diagnosis Date   Anemia    Arthritis    AVN (avascular  necrosis of bone) ([place]) 04/14/2013   Chronic kidney disease    Complication of anesthesia    Decreased mobility    hx. Rt. hip "avascular necrosis" -unable to weight bear long periods, "using wheelchair"..   Depression    GERD (gastroesophageal reflux disease)    Hep B w/o coma    Hep C w/o coma, chronic ([place])    07-20-14 being presently tx. "[facility]"-good response per pt. will complete in 7days- Dr. Linus Salmons follows [facility].   Hypertension    Hypothyroid    mild- no medication   Knee stiffness    right knee -hard to bend at knee   Muscle cramps    Obesity    Osteoporosis  Pneumonia    PONV (postoperative nausea and vomiting)    only with Demerol   Shortness of breath dyspnea    still smokes   Superficial thrombophlebitis    left leg   Transfusion history    70's, 80's ? hepatitis C attributed to past transfusions    Medications:  No anticoagulation prior to admission  Assessment: 65 yo F with history of hypertension, hyperlipidemia, hypothyroidism presented to the ER with complaints of having diarrhea and exertional shortness of breath for the last 3 to 4 days.  Also has chronic lower extremity edema.  Admitted for acute hypoxic and hypercapnic respiratory failure with diarrhea with new onset atrial fibrillation.  D-dimer elevated and CHA2DS2-VASc score of at least 2.  Pharmacy consulted to dose heparin for atrial fibrillation.   03/23/2021 HL 0.24 subtherapeutic on 1700 units/hr Hgb WNL, Plts down to 117 Per RN no bleeding  Goal of Therapy:  Heparin level 0.3-0.7 units/ml Monitor platelets by anticoagulation protocol: Yes   Plan:  Heparin 1500 units IV bolus then Increase Heparin drip to 1900 units/hour 6 hour heparin level Monitor daily heparin level, CBC, s/s bleeding   Dolly Rias RPh 03/23/2021, 5:30 AM

## 2021-03-23 NOTE — Consult Note (Signed)
NAME:  [name], MRN:  [id number], DOB:  Dec 11, 1955, LOS: 1 ADMISSION DATE:  03/22/2021, CONSULTATION DATE:  03/23/2021  REFERRING MD:  Clemon Chambers , CHIEF COMPLAINT: Respiratory distress on BiPAP  History of Present Illness:  Patient is on BiPAP, so history is obtained from chart review.  Presented to the ED with a history of 4 days of loose stools, and poor p.o. intake with right-sided abdominal pain.  Given pain medication and sent to radiology for CT abdomen/pelvis but unable to tolerate it due to pain in her back and hips.  She was then noted to be hypoxic and placed on 5 L oxygen.  ABG showed 7.1 8/82/124. She also developed atrial fibrillation with RVR, placed on Cardizem for rate control, BiPAP and admitted to ICU Due to elevated D-dimer, CT angiogram chest was attempted but she was unable to lie flat, started on IV heparin. Reportedly there is a history of chronic diarrhea, placed on enteric precautions PCCM consulted 7/17 for BiPAP management and persistent acute respiratory acidosis   Pertinent  Medical History  Morbid obesity Hypertension Hypothyroidism Hyperlipidemia Osteoporosis, avascular necrosis of hips  Significant Hospital Events: Including procedures, antibiotic start and stop dates in addition to other pertinent events     Interim History / Subjective:  Critically ill, on BiPAP. Cardizem turned off, heart rate 110s Afebrile. RN reports no stool last 12 hours  Objective   Blood pressure 99/68, pulse 94, temperature 97.9 F (36.6 C), temperature source Oral, resp. rate (!) 22, height 5\' 6"  (1.676 m), weight (!) 169.9 kg, SpO2 100 %.    FiO2 (%):  [50 %] 50 %   Intake/Output Summary (Last 24 hours) at 03/23/2021 1131 Last data filed at 03/23/2021 1003 Gross per 24 hour  Intake 2180.73 ml  Output 425 ml  Net 1755.73 ml   Filed Weights   03/21/21 2200 03/22/21 1056 03/23/21 0443  Weight: (!) 175.5 kg (!) 169.9 kg (!) 169.9 kg    Examination: General:  Obese elderly woman, on BiPAP full facemask, able to mouth words HENT: Short neck, no pallor, icterus, no lymphadenopathy Lungs: Decreased breath sounds bilateral, no accessory muscle use Cardiovascular: Distant S1-S2, irregular Abdomen: Soft obese abdomen, nontender Extremities: No deformity, 1+ edema Neuro: Awake, follows simple commands, nonfocal   Labs show increase in creatinine from 0.7-1.7, mild leukocytosis. ABG 7.2 2/74/119 on nasal cannula  Chest x-ray 7/16 independently reviewed shows diffuse interstitial prominence with cardiomegaly  Echo shows decreased LV function, normal LV function Resolved Hospital Problem list     Assessment & Plan:  Acute on chronic hypoxic and hypercarbic respiratory failure -This appears to have started after she was given pain medications to enable CT scan.  Although she has elevated D-dimer I am not concerned about pulmonary embolism here -We will continue BiPAP, IPAP was increased to provide tidal volumes of 450 cc.  Currently with development of renal failure, she is unable to compensate. Mental status is good so can provide breaks on BiPAP as needed  AKI -related to dehydration as suggested by history, volume status is difficult to assess, chest x-ray shows interstitial prominence, BNP is low, RV shows poor function which may be chronic -Would prefer to use gentle fluids until creatinine normalizes  Atrial fibrillation/RVR, new onset -Agree with IV heparin, Cardizem as needed for rate control, can also use beta-blocker as needed  Best Practice (right click and "Reselect all SmartList Selections" daily)   Diet/type: NPO DVT prophylaxis: systemic heparin GI prophylaxis: N/A Lines:  N/A Foley:  N/A Code Status:  full code Last date of multidisciplinary goals of care discussion [per TR[facility]]  Labs   CBC: Recent Labs  Lab 03/22/21 0241 03/22/21 0620 03/22/21 0746 03/22/21 1130 03/23/21 0454  WBC 11.3*  --   --  12.1* 11.9*  NEUTROABS  8.4*  --   --  9.8*  --   HGB 13.7 15.3* 15.0 14.0 12.8  HCT 44.4 45.0 44.0 47.3* 42.3  MCV 89.7  --   --  95.4 94.0  PLT 171  --   --  203 117*    Basic Metabolic Panel: Recent Labs  Lab 03/22/21 0241 03/22/21 0620 03/22/21 0746 03/22/21 1130 03/23/21 0454  NA 140 142 143 142 139  K 3.4* 3.7 3.8 3.9 4.1  CL 102  --   --  102 101  CO2 29  --   --  32 29  GLUCOSE 123*  --   --  137* 111*  BUN 12  --   --  16 21  CREATININE 0.75  --   --  1.12* 1.71*  CALCIUM 8.8*  --   --  8.6* 8.1*   GFR: Estimated Creatinine Clearance: 52.9 mL/min (A) (by C-G formula based on SCr of 1.71 mg/dL (H)). Recent Labs  Lab 03/22/21 0241 03/22/21 1130 03/22/21 1142 03/22/21 1435 03/23/21 0454  WBC 11.3* 12.1*  --   --  11.9*  LATICACIDVEN  --   --  1.2 1.5  --     Liver Function Tests: Recent Labs  Lab 03/22/21 0241 03/22/21 1130  AST 14* 19  ALT 11 17  ALKPHOS 48 52  BILITOT 0.6 0.8  PROT 7.3 8.0  ALBUMIN 3.5 3.4*   Recent Labs  Lab 03/22/21 0241  LIPASE <10*   No results for input(s): AMMONIA in the last 168 hours.  ABG    Component Value Date/Time   PHART 7.216 (L) 03/23/2021 0815   PCO2ART 73.7 (HH) 03/23/2021 0815   PO2ART 119 (H) 03/23/2021 0815   HCO3 28.8 (H) 03/23/2021 0815   TCO2 33 (H) 03/22/2021 0746   ACIDBASEDEF 0.3 03/23/2021 0815   O2SAT 98.1 03/23/2021 0815     Coagulation Profile: Recent Labs  Lab 03/22/21 1418  INR 1.1    Cardiac Enzymes: No results for input(s): CKTOTAL, CKMB, CKMBINDEX, TROPONINI in the last 168 hours.  HbA1C: Hemoglobin A1C  Date/Time Value Ref Range Status  03/24/2018 02:02 PM 5.6 4.0 - 5.6 % Final  08/26/2015 05:20 PM 5.70  Final   Hgb A1c MFr Bld  Date/Time Value Ref Range Status  09/01/2012 10:38 AM 5.7 (H) <5.7 % Final    Comment:    (NOTE)                                                                       According to the ADA Clinical Practice Recommendations for 2011, when HbA1c is used as a screening  test:  >=6.5%   Diagnostic of Diabetes Mellitus           (if abnormal result is confirmed) 5.7-6.4%   Increased risk of developing Diabetes Mellitus References:Diagnosis and Classification of Diabetes Mellitus,Diabetes NIOE,7035,00(XFGHW 1):S62-S69 and Standards of Medical Care in  Diabetes - 2011,Diabetes MPNT,6144,31 (Suppl 1):S11-S61.  07/25/2009 09:31 PM 5.6 4.6 - 6.1 % Final    Comment:    See lab report for associated comment(s)    CBG: Recent Labs  Lab 03/22/21 2310  GLUCAP 148*    Review of Systems:   Unable to obtain since on BiPAP  Past Medical History:  She,  has a past medical history of Anemia, Arthritis, AVN (avascular necrosis of bone) ([place]) (04/14/2013), Chronic kidney disease, Complication of anesthesia, Decreased mobility, Depression, GERD (gastroesophageal reflux disease), Hep B w/o coma, Hep C w/o coma, chronic ([place]), Hypertension, Hypothyroid, Knee stiffness, Muscle cramps, Obesity, Osteoporosis, Pneumonia, PONV (postoperative nausea and vomiting), Shortness of breath dyspnea, Superficial thrombophlebitis, and Transfusion history.   Surgical History:   Past Surgical History:  Procedure Laterality Date   ABDOMINAL HYSTERECTOMY     APPENDECTOMY     thinks it was removed with hysterectomy   arthroscopies     knee-right x 7   CHOLECYSTECTOMY     COLON SURGERY     COLONOSCOPY     COLONOSCOPY WITH PROPOFOL N/A 02/26/2015   Procedure: COLONOSCOPY WITH PROPOFOL;  Surgeon: Inda Castle, MD;  Location: [facility];  Service: Endoscopy;  Laterality: N/A;   COLONOSCOPY WITH PROPOFOL N/A 03/03/2016   Procedure: COLONOSCOPY WITH PROPOFOL;  Surgeon: Mauri Pole, MD;  Location: [facility];  Service: Endoscopy;  Laterality: N/A;   ESOPHAGOGASTRODUODENOSCOPY (EGD) WITH PROPOFOL N/A 03/03/2016   Procedure: ESOPHAGOGASTRODUODENOSCOPY (EGD) WITH PROPOFOL;  Surgeon: Mauri Pole, MD;  Location: [facility];  Service: Endoscopy;  Laterality: N/A;    JOINT REPLACEMENT     right knee   TOTAL KNEE ARTHROPLASTY       Social History:   reports that she has been smoking cigarettes. She has a 10.00 pack-year smoking history. She has never used smokeless tobacco. She reports that she does not drink alcohol and does not use drugs.   Family History:  Her family history includes Cancer in her father and mother; Diabetes in her brother and mother; Heart disease in her mother; Hypertension in her brother, mother, and sister. There is no history of Colon cancer.   Allergies Allergies  Allergen Reactions   Penicillins Anaphylaxis and Other (See Comments)    Has patient had a PCN reaction causing immediate rash, facial/tongue/throat swelling, SOB or lightheadedness with hypotension: Yes Has patient had a PCN reaction causing severe rash involving mucus membranes or skin necrosis: No Has patient had a PCN reaction that required hospitalization: Yes Has patient had a PCN reaction occurring within the last 10 years: No If all of the above answers are "NO", then may proceed with Cephalosporin use.    Demerol Nausea And Vomiting   Meperidine Nausea And Vomiting   Morphine And Related Nausea And Vomiting     Home Medications  Prior to Admission medications   Medication Sig Start Date End Date Taking? Authorizing Provider  escitalopram (LEXAPRO) 10 MG tablet TAKE 1 TABLET BY MOUTH EVERY DAY. Need TO keep appointment in June FOR future refills Patient taking differently: Take 10 mg by mouth daily. 12/13/19  Yes Kerin Perna, NP  levothyroxine (SYNTHROID) 75 MCG tablet Take 75 mcg by mouth daily before breakfast.   Yes [provider]  lisinopril (ZESTRIL) 40 MG tablet Take 1 tablet (40 mg total) by mouth at bedtime. Patient taking differently: Take 40 mg by mouth daily. 10/18/20  Yes Kerin Perna, NP  omeprazole (PRILOSEC) 40 MG capsule Take 1 capsule (  40 mg total) by mouth daily as needed (acid reflux). 10/16/19  Yes Kerin Perna, NP     Critical care time: 94m     Kara Mead MD. Shade Flood. [facility] Pager : [phone]  If no response to pager , please call [phone] until 7 pm After 7:00 pm call Elink  [phone]   03/23/2021

## 2021-03-23 NOTE — Progress Notes (Signed)
Pharmacy: [facility]  Patient is a 65 y.o F presented to the ED on 7/16 with c/o diarrhea, nausea , SOB and abdominal pain.  She subsequently went into afib with RVR and is currently on heparin drip.   - Ddimer >20 - unable to perform CTA since pt was not able to lay flat for scan - 7/17 LE doppler: neg for DVT  - confirmatory heparin level is therapeutic at 0.49   Goal of Therapy:  Heparin level 0.3-0.7 units/ml Monitor platelets by anticoagulation protocol: Yes  Plan: - continue heparin drip at 1900 units/hr - daily heparin level - monitor for s/sx bleeding  Dia Sitter, PharmD, BCPS 03/23/2021 6:40 PM

## 2021-03-24 ENCOUNTER — Inpatient Hospital Stay (HOSPITAL_COMMUNITY): Payer: Medicare HMO

## 2021-03-24 DIAGNOSIS — J9602 Acute respiratory failure with hypercapnia: Secondary | ICD-10-CM | POA: Diagnosis not present

## 2021-03-24 DIAGNOSIS — J9601 Acute respiratory failure with hypoxia: Secondary | ICD-10-CM | POA: Diagnosis not present

## 2021-03-24 DIAGNOSIS — I4891 Unspecified atrial fibrillation: Secondary | ICD-10-CM | POA: Diagnosis not present

## 2021-03-24 DIAGNOSIS — N179 Acute kidney failure, unspecified: Secondary | ICD-10-CM | POA: Diagnosis not present

## 2021-03-24 LAB — COMPREHENSIVE METABOLIC PANEL
ALT: 17 U/L (ref 0–44)
AST: 18 U/L (ref 15–41)
Albumin: 3.1 g/dL — ABNORMAL LOW (ref 3.5–5.0)
Alkaline Phosphatase: 47 U/L (ref 38–126)
Anion gap: 9 (ref 5–15)
BUN: 27 mg/dL — ABNORMAL HIGH (ref 8–23)
CO2: 28 mmol/L (ref 22–32)
Calcium: 8.4 mg/dL — ABNORMAL LOW (ref 8.9–10.3)
Chloride: 102 mmol/L (ref 98–111)
Creatinine, Ser: 1.32 mg/dL — ABNORMAL HIGH (ref 0.44–1.00)
GFR, Estimated: 45 mL/min — ABNORMAL LOW (ref >60)
Glucose, Bld: 86 mg/dL (ref 70–99)
Potassium: 4 mmol/L (ref 3.5–5.1)
Sodium: 139 mmol/L (ref 135–145)
Total Bilirubin: 0.6 mg/dL (ref 0.3–1.2)
Total Protein: 7.2 g/dL (ref 6.5–8.1)

## 2021-03-24 LAB — GLUCOSE, CAPILLARY
Glucose-Capillary: 79 mg/dL (ref 70–99)
Glucose-Capillary: 100 mg/dL — ABNORMAL HIGH (ref 70–99)

## 2021-03-24 LAB — BLOOD GAS, ARTERIAL
Acid-Base Excess: 0.2 mmol/L (ref 0.0–2.0)
Bicarbonate: 26 mmol/L (ref 20.0–28.0)
O2 Saturation: 91.4 %
Patient temperature: 98.6
pCO2 arterial: 49.1 mmHg — ABNORMAL HIGH (ref 32.0–48.0)
pH, Arterial: 7.343 — ABNORMAL LOW (ref 7.350–7.450)
pO2, Arterial: 66.9 mmHg — ABNORMAL LOW (ref 83.0–108.0)

## 2021-03-24 LAB — TSH: TSH: 3.494 u[IU]/mL (ref 0.350–4.500)

## 2021-03-24 LAB — CBC
HCT: 42 % (ref 36.0–46.0)
Hemoglobin: 12.6 g/dL (ref 12.0–15.0)
MCH: 28.4 pg (ref 26.0–34.0)
MCHC: 30 g/dL (ref 30.0–36.0)
MCV: 94.6 fL (ref 80.0–100.0)
Platelets: 120 10*3/uL — ABNORMAL LOW (ref 150–400)
RBC: 4.44 MIL/uL (ref 3.87–5.11)
RDW: 15.7 % — ABNORMAL HIGH (ref 11.5–15.5)
WBC: 7.6 10*3/uL (ref 4.0–10.5)
nRBC: 0 % (ref 0.0–0.2)

## 2021-03-24 LAB — HEPARIN LEVEL (UNFRACTIONATED)
Heparin Unfractionated: 0.25 IU/mL — ABNORMAL LOW (ref 0.30–0.70)
Heparin Unfractionated: 0.33 IU/mL (ref 0.30–0.70)
Heparin Unfractionated: 0.35 IU/mL (ref 0.30–0.70)

## 2021-03-24 LAB — MAGNESIUM: Magnesium: 2 mg/dL (ref 1.7–2.4)

## 2021-03-24 MED ORDER — METOPROLOL TARTRATE 25 MG PO TABS
25.0000 mg | ORAL_TABLET | Freq: Two times a day (BID) | ORAL | Status: DC
  Administered 2021-03-24: 25 mg via ORAL
  Filled 2021-03-24: qty 1

## 2021-03-24 NOTE — Progress Notes (Addendum)
Patient ID: [name], female   DOB: 1955-09-13, 65 y.o.   MRN: [id number]  PROGRESS NOTE    [name]  BWG:[id number] DOB: Jan 10, 1956 DOA: 03/22/2021 PCP: Sandi Mariscal, MD   Brief Narrative:  65 y.o. female with history of hypertension, hyperlipidemia, hypothyroidism presented with diarrhea along with worsening shortness of breath and lower extremity edema.  On presentation, she was found to be hypoxic and ABG showed hypercapnic respiratory failure along with elevated D-dimer.  CT angiogram was attempted twice but she was unable to lie down flat.  She was placed on BiPAP; chest x-ray showed congestion.  She was also found to have A. fib with RVR for which she was started on Cardizem drip and heparin drip.  COVID-19 test was negative. PCCM was consulted.  Assessment & Plan:   Acute respiratory failure with hypoxia and hypercapnia -Possibly from a combination of obesity hypoventilation/sleep apnea probably undiagnosed;?  Component of CHF -CT angiogram was attempted twice on presentation but she was unable to lie down flat.  VQ scan is pending.  COVID-19 test was negative.  Chest x-ray showed congestion on presentation. -Patient was hypercapnic on presentation.   -Required BIPAP yesterday and overnight as well. Currently on 5L oxygen via nasal cannula. Wean off as able -PCCM following -Continue heparin drip.  Paroxysmal A. fib with RVR -Still intermittently tachycardic.Blood pressure on the lower side.  Off cardizem drip.  Continue heparin drip. Consult cardiology. Echo showed EF of 60-65%. Lower extremity duplex was negative for DVT.  AKI -improving with gentle hydration. monitor  Diarrhea -Questionable cause.  No diarrhea since admission. DC isolation  Hypertension -Blood pressure on the lower side.  off Cardizem drip  Hypothyroidism -On Synthroid at home.  Resume if able to take orally  Leukocytosis -Resolved  Thrombocytopenia -Questionable cause.  Monitor.  No signs of  bleeding  Hyperlipidemia - will resume statin once able to take orally.   DVT prophylaxis: Heparin drip Code Status: Full Family Communication: None at bedside Disposition Plan: Status is: Inpatient  Remains inpatient appropriate because:Inpatient level of care appropriate due to severity of illness  Dispo: The patient is from: Home              Anticipated d/c is to: Home              Patient currently is not medically stable to d/c.   Difficult to place patient No   Consultants: PCCM.  Procedures: Echo as belo  Antimicrobials: None   Subjective: Patient seen and examined at bedside.  Still feels short of breath with slight exertion. Feels slightly better. No chest pain, fever or vomiting reported.  Objective: Vitals:   03/24/21 0335 03/24/21 0400 03/24/21 0435 03/24/21 0600  BP: (!) 107/58 95/60 95/60  (!) 106/56  Pulse: 91 91 100 (!) 108  Resp: 20 (!) 24 (!) 25 20  Temp:      TempSrc:      SpO2: 99% 96% 97% 97%  Weight:   (!) 171 kg   Height:        Intake/Output Summary (Last 24 hours) at 03/24/2021 0726 Last data filed at 03/24/2021 0600 Gross per 24 hour  Intake 1138.16 ml  Output 295 ml  Net 843.16 ml    Filed Weights   03/22/21 1056 03/23/21 0443 03/24/21 0435  Weight: (!) 169.9 kg (!) 169.9 kg (!) 171 kg    Examination:  General exam: Looks chronically ill and deconditioned.  No acute distress. Poor historian. On 5L  [place] oxygen currently ENT: Off BIPAP. No raised JVD Respiratory system: Decreased breath sounds at bases bilaterally. Scattered crackles Cardiovascular system: Tachycardic; S1-S2 heard Gastrointestinal system: Abdomen is morbidly obese, distended, soft and nontender. Bowel sounds are heard Extremities: Lower extremities edema present bilaterally; no cyanosis  Central nervous system: More awake and answers questions appropriately. No focal neurological deficits. Moves extremities Skin: No obvious petechiae/lesions Psychiatry: flat  affect    Data Reviewed: I have personally reviewed following labs and imaging studies  CBC: Recent Labs  Lab 03/22/21 0241 03/22/21 0620 03/22/21 0746 03/22/21 1130 03/23/21 0454 03/24/21 0316  WBC 11.3*  --   --  12.1* 11.9* 7.6  NEUTROABS 8.4*  --   --  9.8*  --   --   HGB 13.7 15.3* 15.0 14.0 12.8 12.6  HCT 44.4 45.0 44.0 47.3* 42.3 42.0  MCV 89.7  --   --  95.4 94.0 94.6  PLT 171  --   --  203 117* 120*    Basic Metabolic Panel: Recent Labs  Lab 03/22/21 0241 03/22/21 0620 03/22/21 0746 03/22/21 1130 03/23/21 0454 03/24/21 0316  NA 140 142 143 142 139 139  K 3.4* 3.7 3.8 3.9 4.1 4.0  CL 102  --   --  102 101 102  CO2 29  --   --  32 29 28  GLUCOSE 123*  --   --  137* 111* 86  BUN 12  --   --  16 21 27*  CREATININE 0.75  --   --  1.12* 1.71* 1.32*  CALCIUM 8.8*  --   --  8.6* 8.1* 8.4*  MG  --   --   --   --   --  2.0    GFR: Estimated Creatinine Clearance: 68.8 mL/min (A) (by C-G formula based on SCr of 1.32 mg/dL (H)). Liver Function Tests: Recent Labs  Lab 03/22/21 0241 03/22/21 1130 03/24/21 0316  AST 14* 19 18  ALT 11 17 17   ALKPHOS 48 52 47  BILITOT 0.6 0.8 0.6  PROT 7.3 8.0 7.2  ALBUMIN 3.5 3.4* 3.1*    Recent Labs  Lab 03/22/21 0241  LIPASE <10*    No results for input(s): AMMONIA in the last 168 hours. Coagulation Profile: Recent Labs  Lab 03/22/21 1418  INR 1.1    Cardiac Enzymes: No results for input(s): CKTOTAL, CKMB, CKMBINDEX, TROPONINI in the last 168 hours. BNP (last 3 results) No results for input(s): PROBNP in the last 8760 hours. HbA1C: No results for input(s): HGBA1C in the last 72 hours. CBG: Recent Labs  Lab 03/22/21 2310 03/23/21 1141 03/23/21 1752 03/23/21 2330 03/24/21 0558  GLUCAP 148* 112* 105* 93 79    Lipid Profile: No results for input(s): CHOL, HDL, LDLCALC, TRIG, CHOLHDL, LDLDIRECT in the last 72 hours. Thyroid Function Tests: Recent Labs    03/22/21 1131  TSH 4.531*    Anemia  Panel: No results for input(s): VITAMINB12, FOLATE, FERRITIN, TIBC, IRON, RETICCTPCT in the last 72 hours. Sepsis Labs: Recent Labs  Lab 03/22/21 1142 03/22/21 1435  LATICACIDVEN 1.2 1.5     Recent Results (from the past 240 hour(s))  Resp Panel by RT-PCR (Flu A&B, Covid) Nasopharyngeal Swab     Status: None   Collection Time: 03/22/21  5:20 AM   Specimen: Nasopharyngeal Swab; Nasopharyngeal(NP) swabs in vial transport medium  Result Value Ref Range Status   SARS Coronavirus 2 by RT PCR NEGATIVE NEGATIVE Final    Comment: (NOTE) SARS-CoV-2 target  nucleic acids are NOT DETECTED.  The SARS-CoV-2 RNA is generally detectable in upper respiratory specimens during the acute phase of infection. The lowest concentration of SARS-CoV-2 viral copies this assay can detect is 138 copies/mL. A negative result does not preclude SARS-Cov-2 infection and should not be used as the sole basis for treatment or other patient management decisions. A negative result may occur with  improper specimen collection/handling, submission of specimen other than nasopharyngeal swab, presence of viral mutation(s) within the areas targeted by this assay, and inadequate number of viral copies(<138 copies/mL). A negative result must be combined with clinical observations, patient history, and epidemiological information. The expected result is Negative.  Fact Sheet for Patients:  EntrepreneurPulse.com.au  Fact Sheet for Healthcare Providers:  IncredibleEmployment.be  This test is no t yet approved or cleared by the [place] FDA and  has been authorized for detection and/or diagnosis of SARS-CoV-2 by FDA under an Emergency Use Authorization (EUA). This EUA will remain  in effect (meaning this test can be used) for the duration of the COVID-19 declaration under Section 564(b)(1) of the Act, 21 U.S.C.section 360bbb-3(b)(1), unless the authorization is terminated  or  revoked sooner.       Influenza A by PCR NEGATIVE NEGATIVE Final   Influenza B by PCR NEGATIVE NEGATIVE Final    Comment: (NOTE) The Xpert Xpress SARS-CoV-2/FLU/RSV plus assay is intended as an aid in the diagnosis of influenza from Nasopharyngeal swab specimens and should not be used as a sole basis for treatment. Nasal washings and aspirates are unacceptable for Xpert Xpress SARS-CoV-2/FLU/RSV testing.  Fact Sheet for Patients: EntrepreneurPulse.com.au  Fact Sheet for Healthcare Providers: IncredibleEmployment.be  This test is not yet approved or cleared by the [place] FDA and has been authorized for detection and/or diagnosis of SARS-CoV-2 by FDA under an Emergency Use Authorization (EUA). This EUA will remain in effect (meaning this test can be used) for the duration of the COVID-19 declaration under Section 564(b)(1) of the Act, 21 U.S.C. section 360bbb-3(b)(1), unless the authorization is terminated or revoked.  Performed at [facility], [address], [place], [place] [address]   Culture, blood (routine x 2)     Status: None (Preliminary result)   Collection Time: 03/22/21 12:10 PM   Specimen: BLOOD  Result Value Ref Range Status   Specimen Description   Final    BLOOD LEFT ARM Performed at [facility] [address], [place], [place] [address]    Special Requests   Final    BOTTLES DRAWN AEROBIC AND ANAEROBIC Blood Culture results may not be optimal due to an inadequate volume of blood received in culture bottles Performed at [facility] [address], [place], [place] [address]    Culture   Final    NO GROWTH 2 DAYS Performed at [facility] [address], [place], [place] [address]    Report Status PENDING  Incomplete  Culture, blood (routine x 2)     Status: None (Preliminary result)   Collection Time: 03/22/21 12:10 PM   Specimen: BLOOD   Result Value Ref Range Status   Specimen Description   Final    BLOOD LEFT HAND Performed at [facility] [address], [place], [place] [address]    Special Requests   Final    BOTTLES DRAWN AEROBIC AND ANAEROBIC Blood Culture results may not be optimal due to an inadequate volume of blood received in culture bottles Performed at [facility]  [place] [address], [place], [place] [address]    Culture   Final    NO GROWTH 2 DAYS Performed at [facility] [address], [place], [place] [address]    Report Status PENDING  Incomplete  MRSA Next Gen by PCR, Nasal     Status: None   Collection Time: 03/22/21  2:00 PM   Specimen: Nasal Mucosa; Nasal Swab  Result Value Ref Range Status   MRSA by PCR Next Gen NOT DETECTED NOT DETECTED Final    Comment: (NOTE) The GeneXpert MRSA Assay (FDA approved for NASAL specimens only), is one component of a comprehensive MRSA colonization surveillance program. It is not intended to diagnose MRSA infection nor to guide or monitor treatment for MRSA infections. Test performance is not FDA approved in patients less than 62 years old. Performed at [facility], [place] [address] [place], [place] [address]           Radiology Studies: ECHOCARDIOGRAM COMPLETE  Result Date: 03/22/2021    ECHOCARDIOGRAM REPORT   Patient Name:   [name] Date of Exam: 03/22/2021 Medical Rec #:  [id number]     Height:       66.0 in Accession #:    [id number]    Weight:       374.6 lb Date of Birth:  Jun 12, 1956     BSA:          2.611 m Patient Age:    20 years      BP:           92/57 mmHg Patient Gender: F             HR:           97 bpm. Exam Location:  Inpatient Procedure: 2D Echo, Cardiac Doppler and Color Doppler Indications:    Atrial Fibrillation I48.91  History:        Patient has prior history of Echocardiogram examinations, most                 recent 03/12/2009. Signs/Symptoms:Shortness of Breath;  Risk                 Factors:Hypertension and Current Smoker. CKD.  Sonographer:    Vickie Epley RDCS Referring Phys: [place]  Sonographer Comments: Patient is morbidly obese. Patient on Bipap. IMPRESSIONS  1. Left ventricular ejection fraction, by estimation, is 60 to 65%. The left ventricle has normal function. The left ventricle has no regional wall motion abnormalities. Left ventricular diastolic function could not be evaluated.  2. The right ventricle is poorly visualized, but appears to be at least mildly dilated and at least mildly depressed. Right ventricular systolic function is mildly reduced. The right ventricular size is mildly enlarged. Tricuspid regurgitation signal is  inadequate for assessing PA pressure.  3. The mitral valve is normal in structure. No evidence of mitral valve regurgitation. No evidence of mitral stenosis.  4. The aortic valve is normal in structure. Aortic valve regurgitation is not visualized. No aortic stenosis is present.  5. The inferior vena cava is dilated in size with >50% respiratory variability, suggesting right atrial pressure of 8 mmHg. FINDINGS  Left Ventricle: Left ventricular ejection fraction, by estimation, is 60 to 65%. The left ventricle has normal function. The left ventricle has no regional wall motion abnormalities. The left ventricular internal cavity size was normal in size. There is  no left ventricular hypertrophy. Left ventricular diastolic  function could not be evaluated due to atrial fibrillation. Left ventricular diastolic function could not be evaluated. Right Ventricle: The right ventricle is poorly visualized, but appears to be at least mildly dilated and at least mildly depressed. The right ventricular size is mildly enlarged. No increase in right ventricular wall thickness. Right ventricular systolic  function is mildly reduced. Tricuspid regurgitation signal is inadequate for assessing PA pressure. Left Atrium: Left atrial size was  normal in size. Right Atrium: Right atrial size was normal in size. Pericardium: There is no evidence of pericardial effusion. Mitral Valve: The mitral valve is normal in structure. Mild mitral annular calcification. No evidence of mitral valve regurgitation. No evidence of mitral valve stenosis. Tricuspid Valve: The tricuspid valve is normal in structure. Tricuspid valve regurgitation is trivial. No evidence of tricuspid stenosis. Aortic Valve: The aortic valve is normal in structure. Aortic valve regurgitation is not visualized. No aortic stenosis is present. Pulmonic Valve: The pulmonic valve was normal in structure. Pulmonic valve regurgitation is not visualized. No evidence of pulmonic stenosis. Aorta: The aortic root is normal in size and structure. Venous: The inferior vena cava is dilated in size with greater than 50% respiratory variability, suggesting right atrial pressure of 8 mmHg. IAS/Shunts: No atrial level shunt detected by color flow Doppler.  LEFT VENTRICLE PLAX 2D LVIDd:         4.50 cm LVIDs:         3.30 cm LV PW:         1.00 cm LV IVS:        1.00 cm LVOT diam:     2.10 cm LVOT Area:     3.46 cm  RIGHT VENTRICLE TAPSE (M-mode): 1.5 cm LEFT ATRIUM           Index LA diam:      3.50 cm 1.34 cm/m LA Vol (A4C): 54.3 ml 20.80 ml/m   AORTA Ao Root diam: 3.60 cm Ao Asc diam:  4.00 cm  SHUNTS Systemic Diam: 2.10 cm Dani Gobble Croitoru MD Electronically signed by Sanda Klein MD Signature Date/Time: 03/22/2021/3:14:01 PM    Final    VAS [place] LOWER EXTREMITY VENOUS (DVT)  Result Date: 03/23/2021  Lower Venous DVT Study Patient Name:  [name]  Date of Exam:   03/23/2021 Medical Rec #: [id number]      Accession #:    [id number] Date of Birth: 1956/07/28      Patient Gender: F Patient Age:   65Y Exam Location:  [facility] Procedure:      VAS [place] LOWER EXTREMITY VENOUS (DVT) Referring Phys: Erskine --------------------------------------------------------------------------------   Indications: Edema and SOB.  Anticoagulation: Heparin. Limitations: Today's examination was severely limited due to patient body habitus, pain intolerance, and inability to reposition. Comparison Study: 03-16-2019 Prior bilateral lower extremity venous was limited                   bu negative for DVT. Performing Technologist: Darlin Coco RDMS,RVT  Examination Guidelines: A complete evaluation includes B-mode imaging, spectral Doppler, color Doppler, and power Doppler as needed of all accessible portions of each vessel. Bilateral testing is considered an integral part of a complete examination. Limited examinations for reoccurring indications may be performed as noted. The reflux portion of the exam is performed with the patient in reverse Trendelenburg.  +---------+---------------+---------+-----------+----------+-------------------+ RIGHT    CompressibilityPhasicitySpontaneityPropertiesThrombus Aging      +---------+---------------+---------+-----------+----------+-------------------+ CFV      Full  Yes      Yes                                      +---------+---------------+---------+-----------+----------+-------------------+ SFJ      Full                                                             +---------+---------------+---------+-----------+----------+-------------------+ FV Prox  Full                                                             +---------+---------------+---------+-----------+----------+-------------------+ FV Mid   Full           Yes      Yes                                      +---------+---------------+---------+-----------+----------+-------------------+ FV Distal               Yes      Yes                                      +---------+---------------+---------+-----------+----------+-------------------+ PFV      Full                                                              +---------+---------------+---------+-----------+----------+-------------------+ POP      Full                                         Unable to examine                                                         popliteal fossa in                                                        sagittal plane due                                                        to positioning/pain +---------+---------------+---------+-----------+----------+-------------------+ PTV  Not visualized      +---------+---------------+---------+-----------+----------+-------------------+ PERO                                                  Not visualized      +---------+---------------+---------+-----------+----------+-------------------+   +---------+---------------+---------+-----------+----------+--------------+ LEFT     CompressibilityPhasicitySpontaneityPropertiesThrombus Aging +---------+---------------+---------+-----------+----------+--------------+ CFV      Full           Yes      Yes                                 +---------+---------------+---------+-----------+----------+--------------+ SFJ      Full                                                        +---------+---------------+---------+-----------+----------+--------------+ FV Prox  Full                                                        +---------+---------------+---------+-----------+----------+--------------+ FV Mid   Full           Yes      Yes                                 +---------+---------------+---------+-----------+----------+--------------+ FV Distal               Yes      Yes                                 +---------+---------------+---------+-----------+----------+--------------+ PFV      Full                                                         +---------+---------------+---------+-----------+----------+--------------+ POP      Full           Yes      Yes                                 +---------+---------------+---------+-----------+----------+--------------+ PTV                                                   Not visualized +---------+---------------+---------+-----------+----------+--------------+ PERO                                                  Not visualized +---------+---------------+---------+-----------+----------+--------------+  Summary: RIGHT: - There is no evidence of deep vein thrombosis in the lower extremity. However, portions of this examination were limited- see technologist comments above.  LEFT: - There is no evidence of deep vein thrombosis in the lower extremity. However, portions of this examination were limited- see technologist comments above.  - No cystic structure found in the popliteal fossa.  *See table(s) above for measurements and observations. Electronically signed by Monica Martinez MD on 03/23/2021 at 2:56:53 PM.    Final         Scheduled Meds:  chlorhexidine  15 mL Mouth Rinse BID   Chlorhexidine Gluconate Cloth  6 each Topical Daily   mouth rinse  15 mL Mouth Rinse q12n4p   Continuous Infusions:  diltiazem (CARDIZEM) infusion Stopped (03/23/21 0813)   heparin 2,100 Units/hr (03/24/21 0455)   lactated ringers 50 mL/hr at 03/24/21 0455          Aline August, MD [facility] 03/24/2021, 7:26 AM

## 2021-03-24 NOTE — Consult Note (Signed)
Cardiology Consultation:   Patient ID: [name] MRN: [id number]; DOB: Jul 25, 1956  Admit date: 03/22/2021 Date of Consult: 03/24/2021  PCP:  Sandi Mariscal, MD   [place] Providers Cardiologist:  [place] (Dr. Oval Linsey)  Patient Profile:   [name] is a 65 y.o. female with a history of hypertension, hypothyroidism, left leg superficial thrombophlebitis, CKD stage III, GERD, Hepatitis B/C, avascular necrosis of the hips, and morbid obesity with BMI of 60 who is being for the evaluation of atrial fibrillation at the request of Dr. Starla Link.  History of Present Illness:   [name] is a 65 year old female with the above history. No prior cardiac history per chart review. Patient lives at home with her husband who is her caretaker. Per triage note, patient is wheelchair/bed bound and does not ambulate without assistance.  She reports that she has to use a lift for transfers.  She lives with her husband who assist her with feeding and bathing.  Patient presented to the ED on 03/21/2021 with right upper quadrant pain and nausea/vomiting and diarrhea x4 days. CT was attempted twice but patient was unable to do it due to hip pain. After returning from CT, patient was found to be hypoxic. ABG showed respiratory acidosis with pH of 7.192, pCO2 of 81.4, pO2 of 60, and BiCarb of 31. High-sensitivity troponin negative x3. D-dimer >20. BNP minimally elevated at 158. Chest x-ray showed cardiomegaly with pulmonary venous congestion with prominence of the interstitial markings and diffuse peribronchial cuffing (pulmonary edema cannot be excluded but not strongly favored (overall appearance favors bronchitis). WBC 11.3, Hgb 13.7, Plts 171. Na 140, K 3.4, Glucose 123, BUN 12, Cr 0.75. Albumin 3.5, AST 14, ALT 11, Alk Phos 48, Total Bili 0.6. Lipase <10. Patient was started on BiPAP for hypercarbic hypoxic respiratory failure and IV Cardizem. N she notes that during this episode she felt her heart racing.  Her heart  rates are down in the 110s and she is no longer perceiving any palpitations.  She notes that her breathing is back to baseline.  She has issues with chronic lower extremity edema that is at her baseline.  She denies orthopnea or PND.  She notes that she would be unable to get a sleep study as an outpatient because of her inability to transfer.   Past Medical History:  Diagnosis Date   Anemia    Arthritis    AVN (avascular necrosis of bone) ([place]) 04/14/2013   Chronic kidney disease    Complication of anesthesia    Decreased mobility    hx. Rt. hip "avascular necrosis" -unable to weight bear long periods, "using wheelchair"..   Depression    GERD (gastroesophageal reflux disease)    Hep B w/o coma    Hep C w/o coma, chronic ([employer])    07-20-14 being presently tx. "[facility]"-good response per pt. will complete in 7days- Dr. Linus Salmons follows [facility].   Hypertension    Hypothyroid    mild- no medication   Knee stiffness    right knee -hard to bend at knee   Muscle cramps    Obesity    Osteoporosis    Pneumonia    PONV (postoperative nausea and vomiting)    only with Demerol   Shortness of breath dyspnea    still smokes   Superficial thrombophlebitis    left leg   Transfusion history    70's, 80's ? hepatitis C attributed to past transfusions    Past  Surgical History:  Procedure Laterality Date   ABDOMINAL HYSTERECTOMY     APPENDECTOMY     thinks it was removed with hysterectomy   arthroscopies     knee-right x 7   CHOLECYSTECTOMY     COLON SURGERY     COLONOSCOPY     COLONOSCOPY WITH PROPOFOL N/A 02/26/2015   Procedure: COLONOSCOPY WITH PROPOFOL;  Surgeon: Inda Castle, MD;  Location: [facility];  Service: Endoscopy;  Laterality: N/A;   COLONOSCOPY WITH PROPOFOL N/A 03/03/2016   Procedure: COLONOSCOPY WITH PROPOFOL;  Surgeon: Mauri Pole, MD;  Location: [facility];  Service: Endoscopy;  Laterality: N/A;   ESOPHAGOGASTRODUODENOSCOPY (EGD)  WITH PROPOFOL N/A 03/03/2016   Procedure: ESOPHAGOGASTRODUODENOSCOPY (EGD) WITH PROPOFOL;  Surgeon: Mauri Pole, MD;  Location: [facility];  Service: Endoscopy;  Laterality: N/A;   JOINT REPLACEMENT     right knee   TOTAL KNEE ARTHROPLASTY       Home Medications:  Prior to Admission medications   Medication Sig Start Date End Date Taking? Authorizing Provider  escitalopram (LEXAPRO) 10 MG tablet TAKE 1 TABLET BY MOUTH EVERY DAY. Need TO keep appointment in June FOR future refills Patient taking differently: Take 10 mg by mouth daily. 12/13/19  Yes Kerin Perna, NP  levothyroxine (SYNTHROID) 75 MCG tablet Take 75 mcg by mouth daily before breakfast.   Yes [provider]  lisinopril (ZESTRIL) 40 MG tablet Take 1 tablet (40 mg total) by mouth at bedtime. Patient taking differently: Take 40 mg by mouth daily. 10/18/20  Yes Kerin Perna, NP  omeprazole (PRILOSEC) 40 MG capsule Take 1 capsule (40 mg total) by mouth daily as needed (acid reflux). 10/16/19  Yes Kerin Perna, NP    Inpatient Medications: Scheduled Meds:  chlorhexidine  15 mL Mouth Rinse BID   Chlorhexidine Gluconate Cloth  6 each Topical Daily   mouth rinse  15 mL Mouth Rinse q12n4p   metoprolol tartrate  25 mg Oral BID   Continuous Infusions:  heparin 2,100 Units/hr (03/24/21 0455)   lactated ringers 50 mL/hr at 03/24/21 0455   PRN Meds: acetaminophen **OR** acetaminophen, traMADol  Allergies:    Allergies  Allergen Reactions   Penicillins Anaphylaxis and Other (See Comments)    Has patient had a PCN reaction causing immediate rash, facial/tongue/throat swelling, SOB or lightheadedness with hypotension: Yes Has patient had a PCN reaction causing severe rash involving mucus membranes or skin necrosis: No Has patient had a PCN reaction that required hospitalization: Yes Has patient had a PCN reaction occurring within the last 10 years: No If all of the above answers are "NO", then may  proceed with Cephalosporin use.    Demerol Nausea And Vomiting   Meperidine Nausea And Vomiting   Morphine And Related Nausea And Vomiting    Social History:   Social History   Socioeconomic History   Marital status: Divorced    Spouse name: Not on file   Number of children: Not on file   Years of education: Not on file   Highest education level: Not on file  Occupational History   Not on file  Tobacco Use   Smoking status: Every Day    Packs/day: 0.50    Years: 20.00    Pack years: 10.00    Types: Cigarettes   Smokeless tobacco: Never   Tobacco comments:    trying to cut back  Vaping Use   Vaping Use: Not on file  Substance and Sexual Activity  Alcohol use: No    Alcohol/week: 0.0 standard drinks   Drug use: No   Sexual activity: Not Currently  Other Topics Concern   Not on file  Social History Narrative   Not on file   Social Determinants of Health   Financial Resource Strain: Not on file  Food Insecurity: Not on file  Transportation Needs: Not on file  Physical Activity: Not on file  Stress: Not on file  Social Connections: Not on file  Intimate Partner Violence: Not on file    Family History:   Family History  Problem Relation Age of Onset   Cancer Mother    Diabetes Mother    Heart disease Mother    Hypertension Mother    Cancer Father    Hypertension Sister    Hypertension Brother    Diabetes Brother    Colon cancer Neg Hx      ROS:  Please see the history of present illness.  All other ROS reviewed and negative.     Physical Exam/Data:   Vitals:   03/24/21 0435 03/24/21 0600 03/24/21 0800 03/24/21 1053  BP: 95/60 (!) 106/56    Pulse: 100 (!) 108    Resp: (!) 25 20    Temp:   98.3 F (36.8 C)   TempSrc:   Oral   SpO2: 97% 97%  92%  Weight: (!) 171 kg     Height:        Intake/Output Summary (Last 24 hours) at 03/24/2021 1058 Last data filed at 03/24/2021 0600 Gross per 24 hour  Intake 1048.05 ml  Output 270 ml  Net 778.05 ml    Last 3 Weights 03/24/2021 03/23/2021 03/22/2021  Weight (lbs) 376 lb 15.8 oz 374 lb 9 oz 374 lb 9 oz  Weight (kg) 171 kg 169.9 kg 169.9 kg     Body mass index is 60.85 kg/m.   General: 65 y.o. female resting comfortably in no acute distress. HEENT: Normocephalic and atraumatic. Sclera clear. EOMs intact. Neck: Supple. No carotid bruits. No JVD. Heart: Irregularly irregular.  Tachycardic.Marland Kitchen Distinct S1 and S2. No murmurs, gallops, or rubs. Radial and distal pedal pulses 2+ and equal bilaterally. Lungs: No increased work of breathing. Clear to ausculation bilaterally. No wheezes, rhonchi, or rales.  Abdomen: Soft, non-distended, and non-tender to palpation. Bowel sounds present in all 4 quadrants.  MSK: Normal strength and tone for age. Extremities: No clubbing, cyanosis, 1+ chronic, woody-edema.    Skin: Warm and dry.  Lower extremity venous stasis dermatitis Neuro: Alert and oriented x3. No focal deficits. Psych: Normal affect. Responds appropriately.   EKG:  The EKG was personally reviewed and demonstrates: Atrial fibrillation, rate 132 bpm, with 3 beats of non-sustained VT. No acute ischemic evaluation.   Telemetry:  Telemetry was personally reviewed and demonstrates: Atrial fibrillation ranging 110s to 140s  Relevant CV Studies:  Echocardiogram 03/22/2021: Impressions:  1. Left ventricular ejection fraction, by estimation, is 60 to 65%. The  left ventricle has normal function. The left ventricle has no regional  wall motion abnormalities. Left ventricular diastolic function could not  be evaluated.   2. The right ventricle is poorly visualized, but appears to be at least  mildly dilated and at least mildly depressed. Right ventricular systolic  function is mildly reduced. The right ventricular size is mildly enlarged.  Tricuspid regurgitation signal is   inadequate for assessing PA pressure.   3. The mitral valve is normal in structure. No evidence of mitral valve   regurgitation.  No evidence of mitral stenosis.   4. The aortic valve is normal in structure. Aortic valve regurgitation is  not visualized. No aortic stenosis is present.   5. The inferior vena cava is dilated in size with >50% respiratory  variability, suggesting right atrial pressure of 8 mmHg.  Laboratory Data:  High Sensitivity Troponin:   Recent Labs  Lab 03/22/21 0629 03/22/21 0829 03/22/21 1130  TROPONINIHS '9 10 9     ' Chemistry Recent Labs  Lab 03/22/21 1130 03/23/21 0454 03/24/21 0316  NA 142 139 139  K 3.9 4.1 4.0  CL 102 101 102  CO2 32 29 28  GLUCOSE 137* 111* 86  BUN 16 21 27*  CREATININE 1.12* 1.71* 1.32*  CALCIUM 8.6* 8.1* 8.4*  GFRNONAA 54* 33* 45*  ANIONGAP '8 9 9    ' Recent Labs  Lab 03/22/21 0241 03/22/21 1130 03/24/21 0316  PROT 7.3 8.0 7.2  ALBUMIN 3.5 3.4* 3.1*  AST 14* 19 18  ALT '11 17 17  ' ALKPHOS 48 52 47  BILITOT 0.6 0.8 0.6   Hematology Recent Labs  Lab 03/22/21 1130 03/23/21 0454 03/24/21 0316  WBC 12.1* 11.9* 7.6  RBC 4.96 4.50 4.44  HGB 14.0 12.8 12.6  HCT 47.3* 42.3 42.0  MCV 95.4 94.0 94.6  MCH 28.2 28.4 28.4  MCHC 29.6* 30.3 30.0  RDW 16.0* 15.9* 15.7*  PLT 203 117* 120*   BNP Recent Labs  Lab 03/22/21 0629  BNP 158.6*    DDimer  Recent Labs  Lab 03/22/21 7106  DDIMER >20.00*     Radiology/Studies:  DG Chest Port 1 View  Result Date: 03/22/2021 CLINICAL DATA:  65 year old female with history of shortness of breath. Nausea, vomiting and diarrhea for the past 4 days. EXAM: PORTABLE CHEST 1 VIEW COMPARISON:  Chest x-ray 03/10/2009. FINDINGS: Lung volumes are normal. Diffuse interstitial prominence and widespread peribronchial cuffing. No confluent consolidative airspace disease. No pleural effusions. No pneumothorax. No definite suspicious appearing pulmonary nodules or masses are noted. Cephalization of the pulmonary vasculature. Cardiomegaly. Upper mediastinal contours are within normal limits. Aortic  atherosclerosis. IMPRESSION: 1. Cardiomegaly with pulmonary venous congestion. 2. There is also prominence of the interstitial markings and diffuse peribronchial cuffing. The possibility of some mild interstitial pulmonary edema is not excluded, but not strongly favored. The overall appearance favors bronchitis, but further clinical evaluation is recommended. 3. Aortic atherosclerosis. Electronically Signed   By: Vinnie Langton M.D.   On: 03/22/2021 05:53   ECHOCARDIOGRAM COMPLETE  Result Date: 03/22/2021    ECHOCARDIOGRAM REPORT   Patient Name:   JOYCELYN LISKA Date of Exam: 03/22/2021 Medical Rec #:  [id number]     Height:       66.0 in Accession #:    [id number]    Weight:       374.6 lb Date of Birth:  Jan 16, 1956     BSA:          2.611 m Patient Age:    3 years      BP:           92/57 mmHg Patient Gender: F             HR:           97 bpm. Exam Location:  Inpatient Procedure: 2D Echo, Cardiac Doppler and Color Doppler Indications:    Atrial Fibrillation I48.91  History:        Patient has prior history of Echocardiogram examinations, most  recent 03/12/2009. Signs/Symptoms:Shortness of Breath; Risk                 Factors:Hypertension and Current Smoker. CKD.  Sonographer:    Vickie Epley RDCS Referring Phys: [place]  Sonographer Comments: Patient is morbidly obese. Patient on Bipap. IMPRESSIONS  1. Left ventricular ejection fraction, by estimation, is 60 to 65%. The left ventricle has normal function. The left ventricle has no regional wall motion abnormalities. Left ventricular diastolic function could not be evaluated.  2. The right ventricle is poorly visualized, but appears to be at least mildly dilated and at least mildly depressed. Right ventricular systolic function is mildly reduced. The right ventricular size is mildly enlarged. Tricuspid regurgitation signal is  inadequate for assessing PA pressure.  3. The mitral valve is normal in structure. No evidence of mitral  valve regurgitation. No evidence of mitral stenosis.  4. The aortic valve is normal in structure. Aortic valve regurgitation is not visualized. No aortic stenosis is present.  5. The inferior vena cava is dilated in size with >50% respiratory variability, suggesting right atrial pressure of 8 mmHg. FINDINGS  Left Ventricle: Left ventricular ejection fraction, by estimation, is 60 to 65%. The left ventricle has normal function. The left ventricle has no regional wall motion abnormalities. The left ventricular internal cavity size was normal in size. There is  no left ventricular hypertrophy. Left ventricular diastolic function could not be evaluated due to atrial fibrillation. Left ventricular diastolic function could not be evaluated. Right Ventricle: The right ventricle is poorly visualized, but appears to be at least mildly dilated and at least mildly depressed. The right ventricular size is mildly enlarged. No increase in right ventricular wall thickness. Right ventricular systolic  function is mildly reduced. Tricuspid regurgitation signal is inadequate for assessing PA pressure. Left Atrium: Left atrial size was normal in size. Right Atrium: Right atrial size was normal in size. Pericardium: There is no evidence of pericardial effusion. Mitral Valve: The mitral valve is normal in structure. Mild mitral annular calcification. No evidence of mitral valve regurgitation. No evidence of mitral valve stenosis. Tricuspid Valve: The tricuspid valve is normal in structure. Tricuspid valve regurgitation is trivial. No evidence of tricuspid stenosis. Aortic Valve: The aortic valve is normal in structure. Aortic valve regurgitation is not visualized. No aortic stenosis is present. Pulmonic Valve: The pulmonic valve was normal in structure. Pulmonic valve regurgitation is not visualized. No evidence of pulmonic stenosis. Aorta: The aortic root is normal in size and structure. Venous: The inferior vena cava is dilated in  size with greater than 50% respiratory variability, suggesting right atrial pressure of 8 mmHg. IAS/Shunts: No atrial level shunt detected by color flow Doppler.  LEFT VENTRICLE PLAX 2D LVIDd:         4.50 cm LVIDs:         3.30 cm LV PW:         1.00 cm LV IVS:        1.00 cm LVOT diam:     2.10 cm LVOT Area:     3.46 cm  RIGHT VENTRICLE TAPSE (M-mode): 1.5 cm LEFT ATRIUM           Index LA diam:      3.50 cm 1.34 cm/m LA Vol (A4C): 54.3 ml 20.80 ml/m   AORTA Ao Root diam: 3.60 cm Ao Asc diam:  4.00 cm  SHUNTS Systemic Diam: 2.10 cm Dani Gobble Croitoru MD Electronically signed by Sanda Klein MD Signature Date/Time:  03/22/2021/3:14:01 PM    Final    VAS [place] LOWER EXTREMITY VENOUS (DVT)  Result Date: 03/23/2021  Lower Venous DVT Study Patient Name:  CHEYANN BLECHA  Date of Exam:   03/23/2021 Medical Rec #: [id number]      Accession #:    [id number] Date of Birth: 1956-01-20      Patient Gender: F Patient Age:   65Y Exam [facility]  [facility] Procedure:      VAS [place] LOWER EXTREMITY VENOUS (DVT) Referring Phys: [id number] Rise Patience --------------------------------------------------------------------------------  Indications: Edema and SOB.  Anticoagulation: Heparin. Limitations: Today's examination was severely limited due to patient body habitus, pain intolerance, and inability to reposition. Comparison Study: 03-16-2019 Prior bilateral lower extremity venous was limited                   bu negative for DVT. Performing Technologist: Darlin Coco RDMS,RVT  Examination Guidelines: A complete evaluation includes B-mode imaging, spectral Doppler, color Doppler, and power Doppler as needed of all accessible portions of each vessel. Bilateral testing is considered an integral part of a complete examination. Limited examinations for reoccurring indications may be performed as noted. The reflux portion of the exam is performed with the patient in reverse Trendelenburg.   +---------+---------------+---------+-----------+----------+-------------------+ RIGHT    CompressibilityPhasicitySpontaneityPropertiesThrombus Aging      +---------+---------------+---------+-----------+----------+-------------------+ CFV      Full           Yes      Yes                                      +---------+---------------+---------+-----------+----------+-------------------+ SFJ      Full                                                             +---------+---------------+---------+-----------+----------+-------------------+ FV Prox  Full                                                             +---------+---------------+---------+-----------+----------+-------------------+ FV Mid   Full           Yes      Yes                                      +---------+---------------+---------+-----------+----------+-------------------+ FV Distal               Yes      Yes                                      +---------+---------------+---------+-----------+----------+-------------------+ PFV      Full                                                             +---------+---------------+---------+-----------+----------+-------------------+  POP      Full                                         Unable to examine                                                         popliteal fossa in                                                        sagittal plane due                                                        to positioning/pain +---------+---------------+---------+-----------+----------+-------------------+ PTV                                                   Not visualized      +---------+---------------+---------+-----------+----------+-------------------+ PERO                                                  Not visualized      +---------+---------------+---------+-----------+----------+-------------------+    +---------+---------------+---------+-----------+----------+--------------+ LEFT     CompressibilityPhasicitySpontaneityPropertiesThrombus Aging +---------+---------------+---------+-----------+----------+--------------+ CFV      Full           Yes      Yes                                 +---------+---------------+---------+-----------+----------+--------------+ SFJ      Full                                                        +---------+---------------+---------+-----------+----------+--------------+ FV Prox  Full                                                        +---------+---------------+---------+-----------+----------+--------------+ FV Mid   Full           Yes      Yes                                 +---------+---------------+---------+-----------+----------+--------------+ FV Distal  Yes      Yes                                 +---------+---------------+---------+-----------+----------+--------------+ PFV      Full                                                        +---------+---------------+---------+-----------+----------+--------------+ POP      Full           Yes      Yes                                 +---------+---------------+---------+-----------+----------+--------------+ PTV                                                   Not visualized +---------+---------------+---------+-----------+----------+--------------+ PERO                                                  Not visualized +---------+---------------+---------+-----------+----------+--------------+     Summary: RIGHT: - There is no evidence of deep vein thrombosis in the lower extremity. However, portions of this examination were limited- see technologist comments above.  LEFT: - There is no evidence of deep vein thrombosis in the lower extremity. However, portions of this examination were limited- see technologist comments above.  - No cystic  structure found in the popliteal fossa.  *See table(s) above for measurements and observations. Electronically signed by Monica Martinez MD on 03/23/2021 at 2:56:53 PM.    Final      Assessment and Plan:   New Onset Atrial Fibrillation with RVR - Patient presented with RUQ pain and nausea/vomiting/diarrhea. Found to be in new onset atrial fibrillation with RVR.  - Rates currently 110s but increased to the 130s while talking.  - Potassium initially 3.4 but supplemented and 4.0 today. - Magnesium 2.0 today. - Will check TSH. - Echo showed LVEF of 60-65% with normal wall motion. RV poorly visualized but appears at least mildly dilated with at least mildly reduced systolic function.  - Initially started on IV Cardizem but stopped due to hypotension.  - Started on Lopressor 38m twice daily today. - CHA2DS2-VASc = 3 (HTN, age xx57 female). Currently on IV Heparin. Will ultimately need to be transitioned to [facility] -She definitely needs a sleep study but does not think she can do it because of her inability to transfer.  Hypertension - History of hypertension but hypotensive here after being started on IV Cardizem. Systolic BP in the 940Jto low 100s. - IV Cardizem stopped.  - Home Lisinopril on hold. -Metoprolol has been ordered but not yet started.  We will see how she responds to this. -If she remains tachycardic and hypotensive, may need to consider TEE/cardioversion prior to discharge. - Continue to monitor.  Acute Combined Respiratory Failure  - Requiring intermittent BiPAP. - Initial ABG showed respiratory acidosis with pH of 7.192, pCO2 of 81.4, pO2  of 60, and BiCarb of 31.Most recent ABG showed pH of 7.216, pCO2 of 73.7, pO2 of 119, BiCarb 28. - D-dimer >20. Patient unable to tolerate CTA because unable to lay flat due to chronic pain. V/Q scan has been ordered. Pulmonology consulted and have low suspicious for PE.  However given that she is essentially bedbound, agree with getting the  V/q. scan. - Management per primary team.  AKI - Creatinine 0.75 on admission. Peaked at 1.71 on 7/17. Improved to 1.32 today. - Suspect prerenal due to hypotension/dehydration. - Continue to monitor.  Morbid Obesity - BMI 60.85. - Weight loss very important but likely will be difficult given patient is wheelchair/bedbound.  Otherwise, per primary team: - RUQ pain with nausea/vomiting/diarrhea - Hypothyroidism - History of hepatitis B/C - Thrombocytopenia  Risk Assessment/Risk Scores:   CHA2DS2-VASc Score = 3  This indicates a 3.2% annual risk of stroke. The patient's score is based upon: CHF History: No HTN History: Yes Diabetes History: No Stroke History: No Vascular Disease History: No Age Score: 1 Gender Score: 1     For questions or updates, please contact [place] Please consult [email or web address] for contact info under    Signed, Puneet Selden C. Oval Linsey, MD, [facility] 03/24/2021 1:24 PM

## 2021-03-24 NOTE — Evaluation (Signed)
Clinical/Bedside Swallow Evaluation Patient Details  Name: WANIA LONGSTRETH MRN: [id number] Date of Birth: 1955-11-03  Today's Date: 03/24/2021 Time: SLP Start Time (ACUTE ONLY): 15 SLP Stop Time (ACUTE ONLY): 1235 SLP Time Calculation (min) (ACUTE ONLY): 15 min  Past Medical History:  Past Medical History:  Diagnosis Date   Anemia    Arthritis    AVN (avascular necrosis of bone) ([place]) 04/14/2013   Chronic kidney disease    Complication of anesthesia    Decreased mobility    hx. Rt. hip "avascular necrosis" -unable to weight bear long periods, "using wheelchair"..   Depression    GERD (gastroesophageal reflux disease)    Hep B w/o coma    Hep C w/o coma, chronic ([place])    07-20-14 being presently tx. "Harvoni"-good response per pt. will complete in 7days- Dr. Linus Salmons follows [place] [facility].   Hypertension    Hypothyroid    mild- no medication   Knee stiffness    right knee -hard to bend at knee   Muscle cramps    Obesity    Osteoporosis    Pneumonia    PONV (postoperative nausea and vomiting)    only with Demerol   Shortness of breath dyspnea    still smokes   Superficial thrombophlebitis    left leg   Transfusion history    70's, 80's ? hepatitis C attributed to past transfusions   Past Surgical History:  Past Surgical History:  Procedure Laterality Date   ABDOMINAL HYSTERECTOMY     APPENDECTOMY     thinks it was removed with hysterectomy   arthroscopies     knee-right x 7   CHOLECYSTECTOMY     COLON SURGERY     COLONOSCOPY     COLONOSCOPY WITH PROPOFOL N/A 02/26/2015   Procedure: COLONOSCOPY WITH PROPOFOL;  Surgeon: Inda Castle, MD;  Location: [facility];  Service: Endoscopy;  Laterality: N/A;   COLONOSCOPY WITH PROPOFOL N/A 03/03/2016   Procedure: COLONOSCOPY WITH PROPOFOL;  Surgeon: Mauri Pole, MD;  Location: [facility];  Service: Endoscopy;  Laterality: N/A;   ESOPHAGOGASTRODUODENOSCOPY (EGD) WITH PROPOFOL N/A 03/03/2016    Procedure: ESOPHAGOGASTRODUODENOSCOPY (EGD) WITH PROPOFOL;  Surgeon: Mauri Pole, MD;  Location: [facility];  Service: Endoscopy;  Laterality: N/A;   JOINT REPLACEMENT     right knee   TOTAL KNEE ARTHROPLASTY     HPI:  Patient is a 65 y.o. female with PMH: HTN, HLD, hypothyroidism, GERD, decreased mobility, obesity, arthritis, anemia, who presented to ED with c/o diarrhea for 3-4 days prior and right lower quadrant abdominal discomfort. She was hypoxic in ER and ABG showed hypercapnic respiratory failure and also D-dimer was elevated.  She was placed on BiPAP for the hypercarbic hypoxic respiratory failure. CXR showed congestion. Patient was also found to be in atrial fibrillation with RVR and started on Cardizem infusion. COVID test negative.   Assessment / Plan / Recommendation Clinical Impression  Patient presents with an oropharyngeal swallow that is largely [facility] however patient exhibiting slight decline in oxygen saturations during solid PO intake (decreasing from 94-95% to 89-91%). No other overt s/s aspiration or penetration observed and patient did not exhibit any overt s/s of respiratory distress. RR remained WFL in range of low to mid 20's. SLP is recommending patient initiate regular solids, thin liquids diet. SLP educated patient on rest breaks for breathing and patient already aware and planning to take her time eating and focusing on foods that are more soft. SLP  plans to f/u with patient at least one time prior to discharging from [place] services to ensure toleration of diet. SLP Visit Diagnosis: Dysphagia, unspecified (R13.10)    Aspiration Risk  No limitations;Mild aspiration risk    Diet Recommendation Regular;Thin liquid   Liquid Administration via: Cup;Straw Medication Administration: Whole meds with liquid Supervision: Patient able to self feed Compensations: Slow rate;Small sips/bites;Other (Comment) (rest breaks if SOB or SpO2 below 88%) Postural Changes: Seated  upright at 90 degrees;Remain upright for at least 30 minutes after po intake    Other  Recommendations Oral Care Recommendations: Oral care BID   Follow up Recommendations None      Frequency and Duration min 1 x/week  1 week       Prognosis Prognosis for Safe Diet Advancement: Good      Swallow Study   General Date of Onset: 03/22/21 HPI: Patient is a 65 y.o. female with PMH: HTN, HLD, hypothyroidism, GERD, decreased mobility, obesity, arthritis, anemia, who presented to ED with c/o diarrhea for 3-4 days prior and right lower quadrant abdominal discomfort. She was hypoxic in ER and ABG showed hypercapnic respiratory failure and also D-dimer was elevated.  She was placed on BiPAP for the hypercarbic hypoxic respiratory failure. CXR showed congestion. Patient was also found to be in atrial fibrillation with RVR and started on Cardizem infusion. COVID test negative. Type of Study: Bedside Swallow Evaluation Previous Swallow Assessment: none found Diet Prior to this Study: Other (Comment) (diet order for regular solids, thin liquids but patient had not eaten/drank prior to this BSE) Temperature Spikes Noted: No Respiratory Status: Nasal cannula History of Recent Intubation: No Behavior/Cognition: Alert;Cooperative;Pleasant mood Oral Cavity Assessment: Within Functional Limits Oral Care Completed by SLP: No Oral Cavity - Dentition: Edentulous;Other (Comment) (patient reports dentures at home but that she does not need them to eat) Vision: Functional for self-feeding Self-Feeding Abilities: Able to feed self Patient Positioning: Upright in bed Baseline Vocal Quality: Hoarse Volitional Cough: Strong Volitional Swallow: Able to elicit    Oral/Motor/Sensory Function Overall Oral Motor/Sensory Function: Within functional limits   Ice Chips     Thin Liquid Thin Liquid: Within functional limits Presentation: Self Fed;Straw    Nectar Thick     Honey Thick     Puree Puree: Not tested    Solid     Solid: Impaired Presentation: Self Fed Pharyngeal Phase Impairments: Change in Vital Signs Other Comments: during PO intake, SpO2 saturations decreased from 94-95% to 89-91%.     Sonia Baller, MA, CCC-SLP Speech Therapy

## 2021-03-24 NOTE — Progress Notes (Signed)
RT called Lab to notify ABG being sent for analysis.

## 2021-03-24 NOTE — Procedures (Signed)
VQ SCAN WILL BE ATTEMPTED TUES AM IF RENAL LABS ARE STILL ELEVATED PER DR Starla Link. PT UNABLE TO LIE FLAT  FOR CT - WILL RE EVALUATE THIS TOMORROW. OK TO HOLD EXAM FOR NOW PER DR Ander Slade / BLS N[facility] [phone]

## 2021-03-24 NOTE — Progress Notes (Addendum)
NAME:  [name], MRN:  [id number], DOB:  1955/11/25, LOS: 2 ADMISSION DATE:  03/22/2021, CONSULTATION DATE:  03/23/21 REFERRING MD:  TRH, Dr Starla Link, CHIEF COMPLAINT:  Respiratory distress, requiring BIPAP   History of Present Illness:  Patient is on BiPAP, so history is obtained from chart review.  Presented to the ED with a history of 4 days of loose stools, and poor p.o. intake with right-sided abdominal pain.  Given pain medication and sent to radiology for CT abdomen/pelvis but unable to tolerate it due to pain in her back and hips.  She was then noted to be hypoxic and placed on 5 L oxygen.  ABG showed 7.1 8/82/124. She also developed atrial fibrillation with RVR, placed on Cardizem for rate control, BiPAP and admitted to ICU Due to elevated D-dimer, CT angiogram chest was attempted but she was unable to lie flat, started on IV heparin. Reportedly there is a history of chronic diarrhea, placed on enteric precautions PCCM consulted 7/17 for BiPAP management and persistent acute respiratory acidosis  Pertinent  Medical History   Past Medical History:  Diagnosis Date   Anemia    Arthritis    AVN (avascular necrosis of bone) ([place]) 04/14/2013   Chronic kidney disease    Complication of anesthesia    Decreased mobility    hx. Rt. hip "avascular necrosis" -unable to weight bear long periods, "using wheelchair"..   Depression    GERD (gastroesophageal reflux disease)    Hep B w/o coma    Hep C w/o coma, chronic ([place])    07-20-14 being presently tx. "Harvoni"-good response per pt. will complete in 7days- Dr. Linus Salmons follows [place] [facility].   Hypertension    Hypothyroid    mild- no medication   Knee stiffness    right knee -hard to bend at knee   Muscle cramps    Obesity    Osteoporosis    Pneumonia    PONV (postoperative nausea and vomiting)    only with Demerol   Shortness of breath dyspnea    still smokes   Superficial thrombophlebitis    left leg    Transfusion history    70's, 80's ? hepatitis C attributed to past transfusions     Significant Hospital Events: Including procedures, antibiotic start and stop dates in addition to other pertinent events   No overnight events   Interim History / Subjective:  Out of bed in chair On room air with saturations about 90-92  Objective   Blood pressure (!) 106/56, pulse (!) 108, temperature 98.3 F (36.8 C), temperature source Oral, resp. rate 20, height 5\' 6"  (1.676 m), weight (!) 171 kg, SpO2 97 %.    FiO2 (%):  [40 %] 40 %   Intake/Output Summary (Last 24 hours) at 03/24/2021 1022 Last data filed at 03/24/2021 0600 Gross per 24 hour  Intake 1048.05 ml  Output 270 ml  Net 778.05 ml   Filed Weights   03/22/21 1056 03/23/21 0443 03/24/21 0435  Weight: (!) 169.9 kg (!) 169.9 kg (!) 171 kg    Examination: General: Middle-age lady, morbidly obese HENT: Moist oral mucosa Lungs: Decreased air movement at the bases Cardiovascular: S1-S2 appreciated, irregularly irregular pulse Abdomen: Soft Extremities: Mild edema Neuro: Alert and oriented x3 GU: [facility] Problem list     Assessment & Plan:  Acute on chronic hypoxic and hypercarbic respiratory failure -This may have been compounded by pain medications -She is on room air  at the present time -Does not appear to need BiPAP at present -Mental status appears good at present   AKI -Cautious hydration  Atrial fibrillation with RVR -On anticoagulation -Cardizem for rate control -Beta-blockade-added Lopressor 25 p.o. twice daily -Additional risk with atrial fibrillation which is new  Discussed need for further evaluation for possible obstructive sleep apnea, obesity hypoventilation -She is very limited at baseline, unable to get around easily -She stated that attempts were made in the past and she just could not get to the lab -Efforts at further evaluation when more stable should be made  Will  repeat blood gas today May stay off BiPAP -Only use BiPAP if needed  Best Practice (right click and "Reselect all SmartList Selections" daily)   Diet/type: Regular consistency (see orders) DVT prophylaxis: systemic heparin GI prophylaxis: N/A Lines: N/A Foley:  N/A Code Status:  full code Last date of multidisciplinary goals of care discussion [per primary service]  Labs   CBC: Recent Labs  Lab 03/22/21 0241 03/22/21 0620 03/22/21 0746 03/22/21 1130 03/23/21 0454 03/24/21 0316  WBC 11.3*  --   --  12.1* 11.9* 7.6  NEUTROABS 8.4*  --   --  9.8*  --   --   HGB 13.7 15.3* 15.0 14.0 12.8 12.6  HCT 44.4 45.0 44.0 47.3* 42.3 42.0  MCV 89.7  --   --  95.4 94.0 94.6  PLT 171  --   --  203 117* 120*    Basic Metabolic Panel: Recent Labs  Lab 03/22/21 0241 03/22/21 0620 03/22/21 0746 03/22/21 1130 03/23/21 0454 03/24/21 0316  NA 140 142 143 142 139 139  K 3.4* 3.7 3.8 3.9 4.1 4.0  CL 102  --   --  102 101 102  CO2 29  --   --  32 29 28  GLUCOSE 123*  --   --  137* 111* 86  BUN 12  --   --  16 21 27*  CREATININE 0.75  --   --  1.12* 1.71* 1.32*  CALCIUM 8.8*  --   --  8.6* 8.1* 8.4*  MG  --   --   --   --   --  2.0   GFR: Estimated Creatinine Clearance: 68.8 mL/min (A) (by C-G formula based on SCr of 1.32 mg/dL (H)). Recent Labs  Lab 03/22/21 0241 03/22/21 1130 03/22/21 1142 03/22/21 1435 03/23/21 0454 03/24/21 0316  WBC 11.3* 12.1*  --   --  11.9* 7.6  LATICACIDVEN  --   --  1.2 1.5  --   --     Liver Function Tests: Recent Labs  Lab 03/22/21 0241 03/22/21 1130 03/24/21 0316  AST 14* 19 18  ALT 11 17 17   ALKPHOS 48 52 47  BILITOT 0.6 0.8 0.6  PROT 7.3 8.0 7.2  ALBUMIN 3.5 3.4* 3.1*   Recent Labs  Lab 03/22/21 0241  LIPASE <10*   No results for input(s): AMMONIA in the last 168 hours.  ABG    Component Value Date/Time   PHART 7.216 (L) 03/23/2021 0815   PCO2ART 73.7 (HH) 03/23/2021 0815   PO2ART 119 (H) 03/23/2021 0815   HCO3 28.8 (H)  03/23/2021 0815   TCO2 33 (H) 03/22/2021 0746   ACIDBASEDEF 0.3 03/23/2021 0815   O2SAT 98.1 03/23/2021 0815     Coagulation Profile: Recent Labs  Lab 03/22/21 1418  INR 1.1    Cardiac Enzymes: No results for input(s): CKTOTAL, CKMB, CKMBINDEX, TROPONINI in the last 168 hours.  HbA1C: Hemoglobin A1C  Date/Time Value Ref Range Status  03/24/2018 02:02 PM 5.6 4.0 - 5.6 % Final  08/26/2015 05:20 PM 5.70  Final   Hgb A1c MFr Bld  Date/Time Value Ref Range Status  09/01/2012 10:38 AM 5.7 (H) <5.7 % Final    Comment:    (NOTE)                                                                       According to the ADA Clinical Practice Recommendations for 2011, when HbA1c is used as a screening test:  >=6.5%   Diagnostic of Diabetes Mellitus           (if abnormal result is confirmed) 5.7-6.4%   Increased risk of developing Diabetes Mellitus References:Diagnosis and Classification of Diabetes Mellitus,Diabetes UTML,4650,35(WSFKC 1):S62-S69 and Standards of Medical Care in         Diabetes - 2011,Diabetes LEXN,1700,17 (Suppl 1):S11-S61.  07/25/2009 09:31 PM 5.6 4.6 - 6.1 % Final    Comment:    See lab report for associated comment(s)    CBG: Recent Labs  Lab 03/22/21 2310 03/23/21 1141 03/23/21 1752 03/23/21 2330 03/24/21 0558  GLUCAP 148* 112* 105* 93 79    Review of Systems:   Denies any significant symptoms at present  Past Medical History:  She,  has a past medical history of Anemia, Arthritis, AVN (avascular necrosis of bone) ([place]) (04/14/2013), Chronic kidney disease, Complication of anesthesia, Decreased mobility, Depression, GERD (gastroesophageal reflux disease), Hep B w/o coma, Hep C w/o coma, chronic ([place]), Hypertension, Hypothyroid, Knee stiffness, Muscle cramps, Obesity, Osteoporosis, Pneumonia, PONV (postoperative nausea and vomiting), Shortness of breath dyspnea, Superficial thrombophlebitis, and Transfusion history.   Surgical History:   Past Surgical  History:  Procedure Laterality Date   ABDOMINAL HYSTERECTOMY     APPENDECTOMY     thinks it was removed with hysterectomy   arthroscopies     knee-right x 7   CHOLECYSTECTOMY     COLON SURGERY     COLONOSCOPY     COLONOSCOPY WITH PROPOFOL N/A 02/26/2015   Procedure: COLONOSCOPY WITH PROPOFOL;  Surgeon: Inda Castle, MD;  [facility];  Service: Endoscopy;  Laterality: N/A;   COLONOSCOPY WITH PROPOFOL N/A 03/03/2016   Procedure: COLONOSCOPY WITH PROPOFOL;  Surgeon: Mauri Pole, MD;  [facility];  Service: Endoscopy;  Laterality: N/A;   ESOPHAGOGASTRODUODENOSCOPY (EGD) WITH PROPOFOL N/A 03/03/2016   Procedure: ESOPHAGOGASTRODUODENOSCOPY (EGD) WITH PROPOFOL;  Surgeon: Mauri Pole, MD;  [facility];  Service: Endoscopy;  Laterality: N/A;   JOINT REPLACEMENT     right knee   TOTAL KNEE ARTHROPLASTY       Social History:   reports that she has been smoking cigarettes. She has a 10.00 pack-year smoking history. She has never used smokeless tobacco. She reports that she does not drink alcohol and does not use drugs.   Family History:  Her family history includes Cancer in her father and mother; Diabetes in her brother and mother; Heart disease in her mother; Hypertension in her brother, mother, and sister. There is no history of Colon cancer.   Allergies Allergies  Allergen Reactions   Penicillins Anaphylaxis and Other (See Comments)    Has patient had a  PCN reaction causing immediate rash, facial/tongue/throat swelling, SOB or lightheadedness with hypotension: Yes Has patient had a PCN reaction causing severe rash involving mucus membranes or skin necrosis: No Has patient had a PCN reaction that required hospitalization: Yes Has patient had a PCN reaction occurring within the last 10 years: No If all of the above answers are "NO", then may proceed with Cephalosporin use.    Demerol Nausea And Vomiting   Meperidine Nausea And Vomiting    Morphine And Related Nausea And Vomiting     Home Medications  Prior to Admission medications   Medication Sig Start Date End Date Taking? Authorizing Provider  escitalopram (LEXAPRO) 10 MG tablet TAKE 1 TABLET BY MOUTH EVERY DAY. Need TO keep appointment in June FOR future refills Patient taking differently: Take 10 mg by mouth daily. 12/13/19  Yes Kerin Perna, NP  levothyroxine (SYNTHROID) 75 MCG tablet Take 75 mcg by mouth daily before breakfast.   Yes [provider]  lisinopril (ZESTRIL) 40 MG tablet Take 1 tablet (40 mg total) by mouth at bedtime. Patient taking differently: Take 40 mg by mouth daily. 10/18/20  Yes Kerin Perna, NP  omeprazole (PRILOSEC) 40 MG capsule Take 1 capsule (40 mg total) by mouth daily as needed (acid reflux). 10/16/19  Yes Kerin Perna, NP    The patient is critically ill with multiple organ systems failure and requires high complexity decision making for assessment and support, frequent evaluation and titration of therapies, application of advanced monitoring technologies and extensive interpretation of multiple databases. Critical Care Time devoted to patient care services described in this note independent of APP/resident time (if applicable)  is 30 minutes.   Sherrilyn Rist MD [facility] Personal pager: See Amion If unanswered, please page CCM On-call: [phone]

## 2021-03-24 NOTE — Progress Notes (Signed)
ANTICOAGULATION CONSULT NOTE - Follow Up Consult  Pharmacy Consult for Heparin Indication: atrial fibrillation  Allergies  Allergen Reactions   Penicillins Anaphylaxis and Other (See Comments)    Has patient had a PCN reaction causing immediate rash, facial/tongue/throat swelling, SOB or lightheadedness with hypotension: Yes Has patient had a PCN reaction causing severe rash involving mucus membranes or skin necrosis: No Has patient had a PCN reaction that required hospitalization: Yes Has patient had a PCN reaction occurring within the last 10 years: No If all of the above answers are "NO", then may proceed with Cephalosporin use.    Demerol Nausea And Vomiting   Meperidine Nausea And Vomiting   Morphine And Related Nausea And Vomiting    Patient Measurements: Height: 5\' 6"  (167.6 cm) Weight: (!) 171 kg (376 lb 15.8 oz) IBW/kg (Calculated) : 59.3 Heparin Dosing Weight: 103 kg  Vital Signs: Temp: 98.3 F (36.8 C) (07/18 0800) Temp Source: Oral (07/18 0800) BP: 106/56 (07/18 0600) Pulse Rate: 108 (07/18 0600)  Labs: Recent Labs    03/22/21 0629 03/22/21 0746 03/22/21 0829 03/22/21 1130 03/22/21 1418 03/22/21 2111 03/23/21 0454 03/23/21 1133 03/23/21 1812 03/24/21 0316 03/24/21 1017  HGB  --    < >  --  14.0  --   --  12.8  --   --  12.6  --   HCT  --    < >  --  47.3*  --   --  42.3  --   --  42.0  --   PLT  --   --   --  203  --   --  117*  --   --  120*  --   APTT  --   --   --   --  24  --   --   --   --   --   --   LABPROT  --   --   --   --  14.0  --   --   --   --   --   --   INR  --   --   --   --  1.1  --   --   --   --   --   --   HEPARINUNFRC  --   --   --   --   --    < > 0.24*   < > 0.49 0.25* 0.33  CREATININE  --   --   --  1.12*  --   --  1.71*  --   --  1.32*  --   TROPONINIHS 9  --  10 9  --   --   --   --   --   --   --    < > = values in this interval not displayed.    Estimated Creatinine Clearance: 68.8 mL/min (A) (by C-G formula based on  SCr of 1.32 mg/dL (H)).   Medications:  Infusions:   heparin 2,100 Units/hr (03/24/21 0455)   lactated ringers 50 mL/hr at 03/24/21 0455    Assessment: 66 yoF presented to the ER with complaints of having diarrhea and exertional shortness of breath for the last 3 to 4 days, along with chronic lower extremity edema.  Admitted for acute hypoxic and hypercapnic respiratory failure with diarrhea with new onset atrial fibrillation.  D-dimer elevated and CHA2DS2-VASc score of at least 2.  Pharmacy consulted to dose heparin for atrial fibrillation.  Today, 03/24/2021:  Heparin level 0.33, therapeutic on heparin at 2100 unit/hr CBC: Hgb WNL, Plt slightly improved to 120k - Note, on 7/17 42% platelet fall on day 2 of heparin with no known previous exposure, not likely antibody mediated No bleeding or complications reported.    Goal of Therapy:  Heparin level 0.3-0.7 units/ml Monitor platelets by anticoagulation protocol: Yes   Plan:  Continue heparin IV infusion at 2100 units/hr Heparin level in 6 hours to confirm therapeutic rate. Daily heparin level and CBC   Gretta Arab PharmD, BCPS Clinical Pharmacist [facility] [phone] 03/24/2021 11:40 AM

## 2021-03-24 NOTE — Progress Notes (Signed)
Found PT on 6 LPM nasal cannula with Sp02 of 98%- decreased to 5 LPM. PT does not use supplemental 02 at home- RN agrees to help titrate Sp02 >=92%. PT states she is breathing ok at this time (much better than at arrival to hospital). PT does not appear to be in respiratory distress at this time. Sp02 97%, RR 18, HR 100. Changed BiPAP order to PRN- RN aware.

## 2021-03-24 NOTE — Progress Notes (Signed)
ANTICOAGULATION CONSULT NOTE - follow up  Pharmacy Consult for heparin Indication: atrial fibrillation  Allergies  Allergen Reactions   Penicillins Anaphylaxis and Other (See Comments)    Has patient had a PCN reaction causing immediate rash, facial/tongue/throat swelling, SOB or lightheadedness with hypotension: Yes Has patient had a PCN reaction causing severe rash involving mucus membranes or skin necrosis: No Has patient had a PCN reaction that required hospitalization: Yes Has patient had a PCN reaction occurring within the last 10 years: No If all of the above answers are "NO", then may proceed with Cephalosporin use.    Demerol Nausea And Vomiting   Meperidine Nausea And Vomiting   Morphine And Related Nausea And Vomiting    Patient Measurements: Height: 5\' 6"  (167.6 cm) Weight: (!) 169.9 kg (374 lb 9 oz) IBW/kg (Calculated) : 59.3 Heparin Dosing Weight: 102.9 kg  Vital Signs: Temp: 97.8 F (36.6 C) (07/18 0332) Temp Source: Oral (07/18 0332) BP: 94/56 (07/18 0205) Pulse Rate: 98 (07/18 0300)  Labs: Recent Labs    03/22/21 0241 03/22/21 0620 03/22/21 0629 03/22/21 0746 03/22/21 0829 03/22/21 1130 03/22/21 1418 03/22/21 2111 03/23/21 0454 03/23/21 1133 03/23/21 1812 03/24/21 0316  HGB 13.7   < >  --  15.0  --  14.0  --   --  12.8  --   --   --   HCT 44.4   < >  --  44.0  --  47.3*  --   --  42.3  --   --   --   PLT 171  --   --   --   --  203  --   --  117*  --   --   --   APTT  --   --   --   --   --   --  24  --   --   --   --   --   LABPROT  --   --   --   --   --   --  14.0  --   --   --   --   --   INR  --   --   --   --   --   --  1.1  --   --   --   --   --   HEPARINUNFRC  --   --   --   --   --   --   --    < > 0.24* 0.40 0.49 0.25*  CREATININE 0.75  --   --   --   --  1.12*  --   --  1.71*  --   --   --   TROPONINIHS  --   --  9  --  10 9  --   --   --   --   --   --    < > = values in this interval not displayed.     Estimated Creatinine  Clearance: 52.9 mL/min (A) (by C-G formula based on SCr of 1.71 mg/dL (H)).   Medical History: Past Medical History:  Diagnosis Date   Anemia    Arthritis    AVN (avascular necrosis of bone) ([place]) 04/14/2013   Chronic kidney disease    Complication of anesthesia    Decreased mobility    hx. Rt. hip "avascular necrosis" -unable to weight bear long periods, "using wheelchair"..   Depression    GERD (gastroesophageal reflux  disease)    Hep B w/o coma    Hep C w/o coma, chronic ([place])    07-20-14 being presently tx. "[facility]"-good response per pt. will complete in 7days- Dr. Linus Salmons follows [place] [facility].   Hypertension    Hypothyroid    mild- no medication   Knee stiffness    right knee -hard to bend at knee   Muscle cramps    Obesity    Osteoporosis    Pneumonia    PONV (postoperative nausea and vomiting)    only with Demerol   Shortness of breath dyspnea    still smokes   Superficial thrombophlebitis    left leg   Transfusion history    70's, 80's ? hepatitis C attributed to past transfusions    Medications:  No anticoagulation prior to admission  Assessment: 65 yo F with history of hypertension, hyperlipidemia, hypothyroidism presented to the ER with complaints of having diarrhea and exertional shortness of breath for the last 3 to 4 days.  Also has chronic lower extremity edema.  Admitted for acute hypoxic and hypercapnic respiratory failure with diarrhea with new onset atrial fibrillation.  D-dimer elevated and CHA2DS2-VASc score of at least 2.  Pharmacy consulted to dose heparin for atrial fibrillation.   03/24/2021 HL 0.25 sub-therapeutic on 1900 units/hr 7/17:Hgb WNL,  Plts down to 117 (42% platelet fall on day 2 of heparin with no known previous exposure, not likely antibody mediated) Per RN no bleeding, no line interruptions  Goal of Therapy:  Heparin level 0.3-0.7 units/ml Monitor platelets by anticoagulation protocol: Yes   Plan:   increase Heparin infusion to 2100 units/hour Heparin level in 6 hours Monitor daily heparin level, CBC, s/s bleeding  Dolly Rias RPh 03/24/2021, 3:49 AM

## 2021-03-24 NOTE — Progress Notes (Signed)
Brief Pharmacy Note re: IV Heparin  65 yo F on IV heparin for Afib.  VQ scan pending.   O: Heparin level 0.35- remains therapeutic on IV heparin 2100 units/hr      No bleeding or infusion related concerns per RN  A/P:Continue heparin at current rate        Daily heparin level & CBC while on heparin  Netta Cedars, PharmD, BCPS 03/24/2021@5 :03 PM

## 2021-03-25 ENCOUNTER — Encounter (HOSPITAL_COMMUNITY): Payer: Self-pay | Admitting: Internal Medicine

## 2021-03-25 ENCOUNTER — Inpatient Hospital Stay (HOSPITAL_COMMUNITY): Payer: Medicare HMO

## 2021-03-25 DIAGNOSIS — I4891 Unspecified atrial fibrillation: Secondary | ICD-10-CM | POA: Diagnosis not present

## 2021-03-25 DIAGNOSIS — J9601 Acute respiratory failure with hypoxia: Secondary | ICD-10-CM | POA: Diagnosis not present

## 2021-03-25 DIAGNOSIS — J9602 Acute respiratory failure with hypercapnia: Secondary | ICD-10-CM | POA: Diagnosis not present

## 2021-03-25 DIAGNOSIS — N179 Acute kidney failure, unspecified: Secondary | ICD-10-CM | POA: Diagnosis not present

## 2021-03-25 LAB — GLUCOSE, CAPILLARY
Glucose-Capillary: 108 mg/dL — ABNORMAL HIGH (ref 70–99)
Glucose-Capillary: 114 mg/dL — ABNORMAL HIGH (ref 70–99)
Glucose-Capillary: 133 mg/dL — ABNORMAL HIGH (ref 70–99)
Glucose-Capillary: 87 mg/dL (ref 70–99)
Glucose-Capillary: 90 mg/dL (ref 70–99)

## 2021-03-25 LAB — CBC
HCT: 40.5 % (ref 36.0–46.0)
Hemoglobin: 11.9 g/dL — ABNORMAL LOW (ref 12.0–15.0)
MCH: 27.9 pg (ref 26.0–34.0)
MCHC: 29.4 g/dL — ABNORMAL LOW (ref 30.0–36.0)
MCV: 95.1 fL (ref 80.0–100.0)
Platelets: 118 10*3/uL — ABNORMAL LOW (ref 150–400)
RBC: 4.26 MIL/uL (ref 3.87–5.11)
RDW: 15.5 % (ref 11.5–15.5)
WBC: 7.2 10*3/uL (ref 4.0–10.5)
nRBC: 0 % (ref 0.0–0.2)

## 2021-03-25 LAB — BASIC METABOLIC PANEL
Anion gap: 10 (ref 5–15)
BUN: 27 mg/dL — ABNORMAL HIGH (ref 8–23)
CO2: 26 mmol/L (ref 22–32)
Calcium: 8.7 mg/dL — ABNORMAL LOW (ref 8.9–10.3)
Chloride: 103 mmol/L (ref 98–111)
Creatinine, Ser: 1.1 mg/dL — ABNORMAL HIGH (ref 0.44–1.00)
GFR, Estimated: 55 mL/min — ABNORMAL LOW (ref >60)
Glucose, Bld: 83 mg/dL (ref 70–99)
Potassium: 3.6 mmol/L (ref 3.5–5.1)
Sodium: 139 mmol/L (ref 135–145)

## 2021-03-25 LAB — MAGNESIUM: Magnesium: 1.9 mg/dL (ref 1.7–2.4)

## 2021-03-25 LAB — HEPARIN LEVEL (UNFRACTIONATED): Heparin Unfractionated: 0.19 IU/mL — ABNORMAL LOW (ref 0.30–0.70)

## 2021-03-25 MED ORDER — APIXABAN 5 MG PO TABS: 5.0000 mg | ORAL_TABLET | Freq: Two times a day (BID) | ORAL | Status: DC

## 2021-03-25 MED ORDER — METOPROLOL TARTRATE 25 MG PO TABS
50.0000 mg | ORAL_TABLET | Freq: Two times a day (BID) | ORAL | Status: DC
  Administered 2021-03-25 – 2021-03-26 (×3): 50 mg via ORAL
  Filled 2021-03-25 (×5): qty 2

## 2021-03-25 MED ORDER — IOHEXOL 350 MG/ML SOLN
100.0000 mL | Freq: Once | INTRAVENOUS | Status: AC | PRN
  Administered 2021-03-25: 100 mL via INTRAVENOUS

## 2021-03-25 MED ORDER — APIXABAN 5 MG PO TABS
5.0000 mg | ORAL_TABLET | Freq: Two times a day (BID) | ORAL | Status: DC
  Administered 2021-03-25 – 2021-03-27 (×5): 5 mg via ORAL
  Filled 2021-03-25 (×5): qty 1

## 2021-03-25 NOTE — Evaluation (Signed)
Physical Therapy Evaluation Patient Details Name: [name] MRN: [id number] DOB: 1956/03/13 Today's Date: 03/25/2021   History of Present Illness  Brief Narrative:   65 y.o. female with history of hypertension, hyperlipidemia, hypothyroidism presented 03/22/21 with diarrhea along with worsening shortness of breath and lower extremity edema.  On presentation, she was found to be hypoxic and ABG showed hypercapnic respiratory failure along with elevated D-dimer.  CT angiogram was attempted twice but she was unable to lie down flat.  She was placed on BiPAP; chest x-ray showed congestion.  She was also found to have A. fib with RVR  Clinical Impression  Patient  resting in recliner, ready to transfer to bed.  Patient guiding staff on  how she wants  to transfer. Patient did require assistance  of 2 to power up from low recliner as patient has a lift recliner at home. Patient leans onto bed, turns and holds 1  hand to turn to sit onto bed. Stood again and turned to lean onto bed  to take  2 steps to [facility] , turned back and sat down. Max assistance to place legs onto bed.  Patient nonambulatory, uses a power chair minimally due to not fitting  body habitus per patient.  Patient spouse assists  with transfers and pericare, she does get into a shower  on shower seat.  Patient hopeful to return home.Pt admitted with above diagnosis.  Pt currently with functional limitations due to the deficits listed below (see PT Problem List). Pt will benefit from skilled PT to increase their independence and safety with mobility to allow discharge to the venue listed below.        Follow Up Recommendations Home health PT (aide) unless spouse unable to provide level of care , then recommend SNF.    Equipment Recommendations  None recommended by PT (needs a new power chair)    Recommendations for Other Services       Precautions / Restrictions Precautions Precautions: Fall Precaution Comments: body habitus,  monitor  HR/Sats, not on O2 Restrictions Weight Bearing Restrictions: No      Mobility  Bed Mobility Overal bed mobility: Needs Assistance Bed Mobility: Sit to Supine;Rolling Rolling: Min assist     Sit to supine: Max assist   General bed mobility comments: Max assist with legs back onto bed.    Transfers Overall transfer level: Needs assistance Equipment used: 1 person hand held assist Transfers: Sit to/from [facility] Sit to Stand: Mod assist Stand pivot transfers: Mod assist       General transfer comment: Patient required support to power up from lower surface( uses lift chair at home.) of recliner then from bed. Patient asking for no external support  , leans onto bed  to turn 180*. Then stood  and turned to lean on bed to step towards HOB and  tuned back to sit down.  Ambulation/Gait                Stairs            Wheelchair Mobility    Modified Rankin (Stroke Patients Only)       Balance Overall balance assessment: Needs assistance Sitting-balance support: Feet supported;Bilateral upper extremity supported Sitting balance-Leahy Scale: Fair     Standing balance support: During functional activity;Bilateral upper extremity supported Standing balance-Leahy Scale: Poor Standing balance comment: reliant on support of UE's  Pertinent Vitals/Pain Pain Assessment: No/denies pain    Home Living Family/patient expects to be discharged to:: Private residence Living Arrangements: Spouse/significant other Available Help at Discharge: Family;Available 24 hours/day Type of Home: House Home Access: Ramped entrance     Home Layout: One level Home Equipment: Walker - 4 wheels;Walker - 2 wheels;Wheelchair - power;Bedside commode Additional Comments: lift chair    Prior Function Level of Independence: Needs assistance   Gait / Transfers Assistance Needed: sleeps in lift chair, transfers to [facility]  with spouse assisting, Power chair  to Br  to transfer to shower seat. stands at [facility] for hygiene performed by spouse  ADL's / Homemaking Assistance Needed: spouse assists        Hand Dominance        Extremity/Trunk Assessment   Upper Extremity Assessment Upper Extremity Assessment: Overall WFL for tasks assessed    Lower Extremity Assessment Lower Extremity Assessment: Generalized weakness    Cervical / Trunk Assessment Cervical / Trunk Assessment: Other exceptions Cervical / Trunk Exceptions: body habitus  Communication      Cognition Arousal/Alertness: Awake/alert Behavior During Therapy: WFL for tasks assessed/performed Overall Cognitive Status: Within Functional Limits for tasks assessed                                        General Comments      Exercises     Assessment/Plan    PT Assessment Patient needs continued PT services  PT Problem List Decreased strength;Decreased mobility;Decreased safety awareness;Obesity;Decreased activity tolerance;Cardiopulmonary status limiting activity;Decreased knowledge of use of DME       PT Treatment Interventions Therapeutic activities;Therapeutic exercise;Patient/family education;Functional mobility training    PT Goals (Current goals can be found in the Care Plan section)  Acute Rehab PT Goals Patient Stated Goal: be able to go home get extra support PT Goal Formulation: With patient Time For Goal Achievement: 04/08/21 Potential to Achieve Goals: Fair    Frequency Min 3X/week   Barriers to discharge        Co-evaluation               AM-PAC PT "6 Clicks" Mobility  Outcome Measure Help needed turning from your back to your side while in a flat bed without using bedrails?: A Lot Help needed moving from lying on your back to sitting on the side of a flat bed without using bedrails?: A Lot Help needed moving to and from a bed to a chair (including a wheelchair)?: A Lot Help needed  standing up from a chair using your arms (e.g., wheelchair or bedside chair)?: A Lot Help needed to walk in hospital room?: Total Help needed climbing 3-5 steps with a railing? : Total 6 Click Score: 10    End of Session   Activity Tolerance: Patient limited by fatigue Patient left: in bed;with call bell/phone within reach;with nursing/sitter in room Nurse Communication: Mobility status PT Visit Diagnosis: Unsteadiness on feet (R26.81);Muscle weakness (generalized) (M62.81);Difficulty in walking, not elsewhere classified (R26.2)    Time: 2633-3545 PT Time Calculation (min) (ACUTE ONLY): 22 min   Charges:   PT Evaluation $PT Eval Low Complexity: [place] [facility] Acute Rehabilitation Services Pager [phone] Office [phone]   Claretha Cooper 03/25/2021, 10:33 AM

## 2021-03-25 NOTE — Discharge Instructions (Signed)

## 2021-03-25 NOTE — Progress Notes (Addendum)
Progress Note  Patient Name: [name] Date of Encounter: 03/25/2021  [facility] Cardiologist: Wendy Latch, MD (New)  Subjective   No significant overnight events. Patient has wet cough this morning but states abdominal pain and nausea/vomiting after resolved. Denies any chest pain or shortness of breath. Occasional palpitations with atrial fibrillation but no prolonged palpitations.  Inpatient Medications    Scheduled Meds:  chlorhexidine  15 mL Mouth Rinse BID   Chlorhexidine Gluconate Cloth  6 each Topical Daily   mouth rinse  15 mL Mouth Rinse q12n4p   metoprolol tartrate  25 mg Oral BID   Continuous Infusions:  heparin 2,300 Units/hr (03/25/21 2119)   lactated ringers 50 mL/hr at 03/25/21 0600   PRN Meds: acetaminophen **OR** acetaminophen, traMADol   Vital Signs    Vitals:   03/25/21 0000 03/25/21 0200 03/25/21 0400 03/25/21 0600  BP: (!) 145/79 120/70 118/60 (!) 128/96  Pulse: 92 92 92 89  Resp: 18 (!) 23 (!) 26 14  Temp: 98.5 F (36.9 C)     TempSrc: Oral     SpO2: 95% 98% 97% 95%  Weight:    (!) 172.7 kg  Height:        Intake/Output Summary (Last 24 hours) at 03/25/2021 0717 Last data filed at 03/25/2021 0600 Gross per 24 hour  Intake 1693.82 ml  Output 600 ml  Net 1093.82 ml   Last 3 Weights 03/25/2021 03/24/2021 03/23/2021  Weight (lbs) 380 lb 11.8 oz 376 lb 15.8 oz 374 lb 9 oz  Weight (kg) 172.7 kg 171 kg 169.9 kg      Telemetry    Atrial fibrillation with rates in the 80 to low 100s at rest.However, rates increase with minimal activity. - Personally Reviewed  ECG    No new ECG tracing since 03/22/2021. - Personally Reviewed  Physical Exam   GEN: Morbidly obese Caucasian female in no acute distress.   Neck: Unable to assess JVD due to body habitus. Cardiac: Irregularly irregular rhythm with normal rate. No significant murmurs, rubs, or gallops.  Respiratory: Clear to auscultation bilaterally.Possible faint wheeze but no  significant crackles or rhonchi appreciated. GI: Soft, non-distended, and non-tender. MS: Chronic 1+ woody edema of bilateral lower extremities. Neuro:  No focal deficits. Psych: Normal affect. Responds appropriately.  Labs    High Sensitivity Troponin:   Recent Labs  Lab 03/22/21 0629 03/22/21 0829 03/22/21 1130  TROPONINIHS 9 10 9       Chemistry Recent Labs  Lab 03/22/21 0241 03/22/21 0620 03/22/21 1130 03/23/21 0454 03/24/21 0316 03/25/21 0250  NA 140   < > 142 139 139 139  K 3.4*   < > 3.9 4.1 4.0 3.6  CL 102  --  102 101 102 103  CO2 29  --  32 29 28 26   GLUCOSE 123*  --  137* 111* 86 83  BUN 12  --  16 21 27* 27*  CREATININE 0.75  --  1.12* 1.71* 1.32* 1.10*  CALCIUM 8.8*  --  8.6* 8.1* 8.4* 8.7*  PROT 7.3  --  8.0  --  7.2  --   ALBUMIN 3.5  --  3.4*  --  3.1*  --   AST 14*  --  19  --  18  --   ALT 11  --  17  --  17  --   ALKPHOS 48  --  52  --  47  --   BILITOT 0.6  --  0.8  --  0.6  --   GFRNONAA >60  --  54* 33* 45* 55*  ANIONGAP 9  --  8 9 9 10    < > = values in this interval not displayed.     Hematology Recent Labs  Lab 03/23/21 0454 03/24/21 0316 03/25/21 0250  WBC 11.9* 7.6 7.2  RBC 4.50 4.44 4.26  HGB 12.8 12.6 11.9*  HCT 42.3 42.0 40.5  MCV 94.0 94.6 95.1  MCH 28.4 28.4 27.9  MCHC 30.3 30.0 29.4*  RDW 15.9* 15.7* 15.5  PLT 117* 120* 118*    BNP Recent Labs  Lab 03/22/21 0629  BNP 158.6*     DDimer  Recent Labs  Lab 03/22/21 0629  DDIMER >20.00*     Radiology    NM Pulmonary Perf and Vent  Result Date: 03/24/2021 [name]     03/24/2021  4:52 PM VQ SCAN WILL BE ATTEMPTED TUES AM IF RENAL LABS ARE STILL ELEVATED PER DR Starla Link. PT UNABLE TO LIE FLAT  FOR CT - WILL RE EVALUATE THIS TOMORROW. OK TO HOLD EXAM FOR NOW PER DR OLALERE / BLS NUC MED [phone]   VAS [place] LOWER EXTREMITY VENOUS (DVT)  Result Date: 03/23/2021  Lower Venous DVT Study Patient Name:  [name]  Date of Exam:   03/23/2021 Medical Rec #:  [id number]      Accession #:    [id number] Date of Birth: 20-Sep-1955      Patient Gender: F Patient Age:   65Y Exam Location:  [facility] Procedure:      VAS [place] LOWER EXTREMITY VENOUS (DVT) Referring Phys: [id number] Patience --------------------------------------------------------------------------------  Indications: Edema and SOB.  Anticoagulation: Heparin. Limitations: Today's examination was severely limited due to patient body habitus, pain intolerance, and inability to reposition. Comparison Study: 03-16-2019 Prior bilateral lower extremity venous was limited                   bu negative for DVT. Performing Technologist: [name] RDMS,RVT  Examination Guidelines: A complete evaluation includes B-mode imaging, spectral Doppler, color Doppler, and power Doppler as needed of all accessible portions of each vessel. Bilateral testing is considered an integral part of a complete examination. Limited examinations for reoccurring indications may be performed as noted. The reflux portion of the exam is performed with the patient in reverse Trendelenburg.  +---------+---------------+---------+-----------+----------+-------------------+ RIGHT    CompressibilityPhasicitySpontaneityPropertiesThrombus Aging      +---------+---------------+---------+-----------+----------+-------------------+ CFV      Full           Yes      Yes                                      +---------+---------------+---------+-----------+----------+-------------------+ SFJ      Full                                                             +---------+---------------+---------+-----------+----------+-------------------+ FV Prox  Full                                                             +---------+---------------+---------+-----------+----------+-------------------+  FV Mid   Full           Yes      Yes                                       +---------+---------------+---------+-----------+----------+-------------------+ FV Distal               Yes      Yes                                      +---------+---------------+---------+-----------+----------+-------------------+ PFV      Full                                                             +---------+---------------+---------+-----------+----------+-------------------+ POP      Full                                         Unable to examine                                                         popliteal fossa in                                                        sagittal plane due                                                        to positioning/pain +---------+---------------+---------+-----------+----------+-------------------+ PTV                                                   Not visualized      +---------+---------------+---------+-----------+----------+-------------------+ PERO                                                  Not visualized      +---------+---------------+---------+-----------+----------+-------------------+   +---------+---------------+---------+-----------+----------+--------------+ LEFT     CompressibilityPhasicitySpontaneityPropertiesThrombus Aging +---------+---------------+---------+-----------+----------+--------------+ CFV      Full           Yes      Yes                                 +---------+---------------+---------+-----------+----------+--------------+  SFJ      Full                                                        +---------+---------------+---------+-----------+----------+--------------+ FV Prox  Full                                                        +---------+---------------+---------+-----------+----------+--------------+ FV Mid   Full           Yes      Yes                                  +---------+---------------+---------+-----------+----------+--------------+ FV Distal               Yes      Yes                                 +---------+---------------+---------+-----------+----------+--------------+ PFV      Full                                                        +---------+---------------+---------+-----------+----------+--------------+ POP      Full           Yes      Yes                                 +---------+---------------+---------+-----------+----------+--------------+ PTV                                                   Not visualized +---------+---------------+---------+-----------+----------+--------------+ PERO                                                  Not visualized +---------+---------------+---------+-----------+----------+--------------+     Summary: RIGHT: - There is no evidence of deep vein thrombosis in the lower extremity. However, portions of this examination were limited- see technologist comments above.  LEFT: - There is no evidence of deep vein thrombosis in the lower extremity. However, portions of this examination were limited- see technologist comments above.  - No cystic structure found in the popliteal fossa.  *See table(s) above for measurements and observations. Electronically signed by Monica Martinez MD on 03/23/2021 at 2:56:53 PM.    Final     Cardiac Studies   Echocardiogram 03/22/2021: Impressions: 1. Left ventricular ejection fraction, by estimation, is 60 to 65%. The  left ventricle has normal function. The left ventricle has no regional  wall motion abnormalities. Left ventricular diastolic function could not  be evaluated.  2. The right ventricle is poorly visualized, but appears to be at least  mildly dilated and at least mildly depressed. Right ventricular systolic  function is mildly reduced. The right ventricular size is mildly enlarged.  Tricuspid regurgitation signal is   inadequate  for assessing PA pressure.   3. The mitral valve is normal in structure. No evidence of mitral valve  regurgitation. No evidence of mitral stenosis.   4. The aortic valve is normal in structure. Aortic valve regurgitation is  not visualized. No aortic stenosis is present.   5. The inferior vena cava is dilated in size with >50% respiratory  variability, suggesting right atrial pressure of 8 mmHg.   Patient Profile     65 y.o. female with a history of hypertension, hypothyroidism, left leg superficial thrombophlebitis, CKD stage III, GERD, Hepatitis B/C, avascular necrosis of the hips, and morbid obesity with BMI of 60 who is being for the evaluation of atrial fibrillation at the request of Dr. Starla Link.  Assessment & Plan    New Onset Atrial Fibrillation with RVR - Patient presented with RUQ pain and nausea/vomiting/diarrhea. Found to be in new onset atrial fibrillation with RVR. - Rates better controlled. Currently in the 80s to low 100s at rest but increase with minimal activity (although suspect this is mostly due to deconditioning and obesity). - Electrolytes OK. TSH normal. - Echo showed LVEF of 60-65% with normal wall motion. RV poorly visualized but appears at least mildly dilated with at least mildly reduced systolic function. - Initially started on IV Cardizem but stopped due to hypotension. - Started on Lopressor 25mg  twice daily yesterday. Will increase to 50mg  twice daily. - CHA2DS2-VASc = 3 (HTN, age x50, female). Currently on IV Heparin. There are no plans for any procedures - will transition to Eliquis 5mg  twice daily. - Patient is mostly bedbound due to chronic hip pain. She states it is very difficult to get around and to doctor appointments because she cannot sit up for more than 2 hours due to her hip pain. It may be best to focus on rate control with her rather than outpatient DCCV as long as heart rates and BP remain well controlled. Will discuss with MD. - Outpatient sleep  study recommended but patient not think she can do it because of her inability to transfer.   Hypertension - History of hypertension but hypotensive here after being started on IV Cardizem. BP improved after IV Cardizem stopped. - Started on Lopressor yesterday and BP stable. Will increase to 50mg  twice daily for more rate control. - Home Lisinopril currently on hold. - Continue to monitor.   Acute Combined Respiratory Failure - Requiring intermittent BiPAP. - Initial ABG showed respiratory acidosis with pH of 7.192, pCO2 of 81.4, pO2 of 60, and BiCarb of 31. Improving. Most recent ABG on 7/18 showed pH 7.34, pCO2 49.1, pO2 66.9, Bicarb 26. - D-dimer >20. Patient unable to tolerate CTA because unable to lay flat due to chronic pain. V/Q scan has been ordered. Pulmonology consulted and have low suspicious for PE.  However given that she is essentially bedbound, agree with getting the V/Q scan. - Management per primary team.   AKI - Creatinine 0.75 on admission. Peaked at 1.71 on 7/17. Improved to 1.10 today. - Suspect prerenal due to hypotension/dehydration. - Continue to monitor.   Morbid Obesity - BMI 60.85. - Weight loss very important but likely will be difficult given patient is wheelchair/bedbound.   Otherwise, per primary team: - RUQ pain  with nausea/vomiting/diarrhea - Hypothyroidism - History of hepatitis B/C - Thrombocytopenia  For questions or updates, please contact [place] Please consult [email or web address] for contact info under        Signed, Darreld Mclean, PA-C  03/25/2021, 7:17 AM

## 2021-03-25 NOTE — H&P (View-Only) (Signed)
Progress Note  Patient Name: [name] Date of Encounter: 03/25/2021  [facility] Cardiologist: Skeet Latch, MD (New)  Subjective   No significant overnight events. Patient has wet cough this morning but states abdominal pain and nausea/vomiting after resolved. Denies any chest pain or shortness of breath. Occasional palpitations with atrial fibrillation but no prolonged palpitations.  Inpatient Medications    Scheduled Meds:  chlorhexidine  15 mL Mouth Rinse BID   Chlorhexidine Gluconate Cloth  6 each Topical Daily   mouth rinse  15 mL Mouth Rinse q12n4p   metoprolol tartrate  25 mg Oral BID   Continuous Infusions:  heparin 2,300 Units/hr (03/25/21 1517)   lactated ringers 50 mL/hr at 03/25/21 0600   PRN Meds: acetaminophen **OR** acetaminophen, traMADol   Vital Signs    Vitals:   03/25/21 0000 03/25/21 0200 03/25/21 0400 03/25/21 0600  BP: (!) 145/79 120/70 118/60 (!) 128/96  Pulse: 92 92 92 89  Resp: 18 (!) 23 (!) 26 14  Temp: 98.5 F (36.9 C)     TempSrc: Oral     SpO2: 95% 98% 97% 95%  Weight:    (!) 172.7 kg  Height:        Intake/Output Summary (Last 24 hours) at 03/25/2021 0717 Last data filed at 03/25/2021 0600 Gross per 24 hour  Intake 1693.82 ml  Output 600 ml  Net 1093.82 ml   Last 3 Weights 03/25/2021 03/24/2021 03/23/2021  Weight (lbs) 380 lb 11.8 oz 376 lb 15.8 oz 374 lb 9 oz  Weight (kg) 172.7 kg 171 kg 169.9 kg      Telemetry    Atrial fibrillation with rates in the 80 to low 100s at rest.However, rates increase with minimal activity. - Personally Reviewed  ECG    No new ECG tracing since 03/22/2021. - Personally Reviewed  Physical Exam   GEN: Morbidly obese Caucasian female in no acute distress.   Neck: Unable to assess JVD due to body habitus. Cardiac: Irregularly irregular rhythm with normal rate. No significant murmurs, rubs, or gallops.  Respiratory: Clear to auscultation bilaterally.Possible faint wheeze but no  significant crackles or rhonchi appreciated. GI: Soft, non-distended, and non-tender. MS: Chronic 1+ woody edema of bilateral lower extremities. Neuro:  No focal deficits. Psych: Normal affect. Responds appropriately.  Labs    High Sensitivity Troponin:   Recent Labs  Lab 03/22/21 0629 03/22/21 0829 03/22/21 1130  TROPONINIHS 9 10 9       Chemistry Recent Labs  Lab 03/22/21 0241 03/22/21 0620 03/22/21 1130 03/23/21 0454 03/24/21 0316 03/25/21 0250  NA 140   < > 142 139 139 139  K 3.4*   < > 3.9 4.1 4.0 3.6  CL 102  --  102 101 102 103  CO2 29  --  32 29 28 26   GLUCOSE 123*  --  137* 111* 86 83  BUN 12  --  16 21 27* 27*  CREATININE 0.75  --  1.12* 1.71* 1.32* 1.10*  CALCIUM 8.8*  --  8.6* 8.1* 8.4* 8.7*  PROT 7.3  --  8.0  --  7.2  --   ALBUMIN 3.5  --  3.4*  --  3.1*  --   AST 14*  --  19  --  18  --   ALT 11  --  17  --  17  --   ALKPHOS 48  --  52  --  47  --   BILITOT 0.6  --  0.8  --  0.6  --   GFRNONAA >60  --  54* 33* 45* 55*  ANIONGAP 9  --  8 9 9 10    < > = values in this interval not displayed.     Hematology Recent Labs  Lab 03/23/21 0454 03/24/21 0316 03/25/21 0250  WBC 11.9* 7.6 7.2  RBC 4.50 4.44 4.26  HGB 12.8 12.6 11.9*  HCT 42.3 42.0 40.5  MCV 94.0 94.6 95.1  MCH 28.4 28.4 27.9  MCHC 30.3 30.0 29.4*  RDW 15.9* 15.7* 15.5  PLT 117* 120* 118*    BNP Recent Labs  Lab 03/22/21 0629  BNP 158.6*     DDimer  Recent Labs  Lab 03/22/21 0629  DDIMER >20.00*     Radiology    NM Pulmonary Perf and Vent  Result Date: 03/24/2021 Lenore Manner     03/24/2021  4:52 PM VQ SCAN WILL BE ATTEMPTED TUES AM IF RENAL LABS ARE STILL ELEVATED PER DR Starla Link. PT UNABLE TO LIE FLAT  FOR CT - WILL RE EVALUATE THIS TOMORROW. OK TO HOLD EXAM FOR NOW PER DR OLALERE / BLS [facility] [phone]   VAS [place] LOWER EXTREMITY VENOUS (DVT)  Result Date: 03/23/2021  Lower Venous DVT Study Patient Name:  LIANE TRIBBEY  Date of Exam:   03/23/2021 Medical Rec #:  [id number]      Accession #:    [id number] Date of Birth: [birth year]      Patient Gender: F Patient Age:   65Y Exam Location:  [facility] Procedure:      VAS [place] LOWER EXTREMITY VENOUS (DVT) Referring Phys: [id number] Rise Patience --------------------------------------------------------------------------------  Indications: Edema and SOB.  Anticoagulation: Heparin. Limitations: Today's examination was severely limited due to patient body habitus, pain intolerance, and inability to reposition. Comparison Study: 03-16-2019 Prior bilateral lower extremity venous was limited                   bu negative for DVT. Performing Technologist: Darlin Coco RDMS,RVT  Examination Guidelines: A complete evaluation includes B-mode imaging, spectral Doppler, color Doppler, and power Doppler as needed of all accessible portions of each vessel. Bilateral testing is considered an integral part of a complete examination. Limited examinations for reoccurring indications may be performed as noted. The reflux portion of the exam is performed with the patient in reverse Trendelenburg.  +---------+---------------+---------+-----------+----------+-------------------+ RIGHT    CompressibilityPhasicitySpontaneityPropertiesThrombus Aging      +---------+---------------+---------+-----------+----------+-------------------+ CFV      Full           Yes      Yes                                      +---------+---------------+---------+-----------+----------+-------------------+ SFJ      Full                                                             +---------+---------------+---------+-----------+----------+-------------------+ FV Prox  Full                                                             +---------+---------------+---------+-----------+----------+-------------------+  FV Mid   Full           Yes      Yes                                       +---------+---------------+---------+-----------+----------+-------------------+ FV Distal               Yes      Yes                                      +---------+---------------+---------+-----------+----------+-------------------+ PFV      Full                                                             +---------+---------------+---------+-----------+----------+-------------------+ POP      Full                                         Unable to examine                                                         popliteal fossa in                                                        sagittal plane due                                                        to positioning/pain +---------+---------------+---------+-----------+----------+-------------------+ PTV                                                   Not visualized      +---------+---------------+---------+-----------+----------+-------------------+ PERO                                                  Not visualized      +---------+---------------+---------+-----------+----------+-------------------+   +---------+---------------+---------+-----------+----------+--------------+ LEFT     CompressibilityPhasicitySpontaneityPropertiesThrombus Aging +---------+---------------+---------+-----------+----------+--------------+ CFV      Full           Yes      Yes                                 +---------+---------------+---------+-----------+----------+--------------+  SFJ      Full                                                        +---------+---------------+---------+-----------+----------+--------------+ FV Prox  Full                                                        +---------+---------------+---------+-----------+----------+--------------+ FV Mid   Full           Yes      Yes                                  +---------+---------------+---------+-----------+----------+--------------+ FV Distal               Yes      Yes                                 +---------+---------------+---------+-----------+----------+--------------+ PFV      Full                                                        +---------+---------------+---------+-----------+----------+--------------+ POP      Full           Yes      Yes                                 +---------+---------------+---------+-----------+----------+--------------+ PTV                                                   Not visualized +---------+---------------+---------+-----------+----------+--------------+ PERO                                                  Not visualized +---------+---------------+---------+-----------+----------+--------------+     Summary: RIGHT: - There is no evidence of deep vein thrombosis in the lower extremity. However, portions of this examination were limited- see technologist comments above.  LEFT: - There is no evidence of deep vein thrombosis in the lower extremity. However, portions of this examination were limited- see technologist comments above.  - No cystic structure found in the popliteal fossa.  *See table(s) above for measurements and observations. Electronically signed by Monica Martinez MD on 03/23/2021 at 2:56:53 PM.    Final     Cardiac Studies   Echocardiogram 03/22/2021: Impressions: 1. Left ventricular ejection fraction, by estimation, is 60 to 65%. The  left ventricle has normal function. The left ventricle has no regional  wall motion abnormalities. Left ventricular diastolic function could not  be evaluated.  2. The right ventricle is poorly visualized, but appears to be at least  mildly dilated and at least mildly depressed. Right ventricular systolic  function is mildly reduced. The right ventricular size is mildly enlarged.  Tricuspid regurgitation signal is   inadequate  for assessing PA pressure.   3. The mitral valve is normal in structure. No evidence of mitral valve  regurgitation. No evidence of mitral stenosis.   4. The aortic valve is normal in structure. Aortic valve regurgitation is  not visualized. No aortic stenosis is present.   5. The inferior vena cava is dilated in size with >50% respiratory  variability, suggesting right atrial pressure of 8 mmHg.   Patient Profile     65 y.o. female with a history of hypertension, hypothyroidism, left leg superficial thrombophlebitis, CKD stage III, GERD, Hepatitis B/C, avascular necrosis of the hips, and morbid obesity with BMI of 60 who is being for the evaluation of atrial fibrillation at the request of Dr. Starla Link.  Assessment & Plan    New Onset Atrial Fibrillation with RVR - Patient presented with RUQ pain and nausea/vomiting/diarrhea. Found to be in new onset atrial fibrillation with RVR. - Rates better controlled. Currently in the 80s to low 100s at rest but increase with minimal activity (although suspect this is mostly due to deconditioning and obesity). - Electrolytes OK. TSH normal. - Echo showed LVEF of 60-65% with normal wall motion. RV poorly visualized but appears at least mildly dilated with at least mildly reduced systolic function. - Initially started on IV Cardizem but stopped due to hypotension. - Started on Lopressor 25mg  twice daily yesterday. Will increase to 50mg  twice daily. - CHA2DS2-VASc = 3 (HTN, age x86, female). Currently on IV Heparin. There are no plans for any procedures - will transition to Eliquis 5mg  twice daily. - Patient is mostly bedbound due to chronic hip pain. She states it is very difficult to get around and to doctor appointments because she cannot sit up for more than 2 hours due to her hip pain. It may be best to focus on rate control with her rather than outpatient DCCV as long as heart rates and BP remain well controlled. Will discuss with MD. - Outpatient sleep  study recommended but patient not think she can do it because of her inability to transfer.   Hypertension - History of hypertension but hypotensive here after being started on IV Cardizem. BP improved after IV Cardizem stopped. - Started on Lopressor yesterday and BP stable. Will increase to 50mg  twice daily for more rate control. - Home Lisinopril currently on hold. - Continue to monitor.   Acute Combined Respiratory Failure - Requiring intermittent BiPAP. - Initial ABG showed respiratory acidosis with pH of 7.192, pCO2 of 81.4, pO2 of 60, and BiCarb of 31. Improving. Most recent ABG on 7/18 showed pH 7.34, pCO2 49.1, pO2 66.9, Bicarb 26. - D-dimer >20. Patient unable to tolerate CTA because unable to lay flat due to chronic pain. V/Q scan has been ordered. Pulmonology consulted and have low suspicious for PE.  However given that she is essentially bedbound, agree with getting the V/Q scan. - Management per primary team.   AKI - Creatinine 0.75 on admission. Peaked at 1.71 on 7/17. Improved to 1.10 today. - Suspect prerenal due to hypotension/dehydration. - Continue to monitor.   Morbid Obesity - BMI 60.85. - Weight loss very important but likely will be difficult given patient is wheelchair/bedbound.   Otherwise, per primary team: - RUQ pain  with nausea/vomiting/diarrhea - Hypothyroidism - History of hepatitis B/C - Thrombocytopenia  For questions or updates, please contact [place] Please consult [email or web address] for contact info under        Signed, Darreld Mclean, PA-C  03/25/2021, 7:17 AM

## 2021-03-25 NOTE — Progress Notes (Signed)
ANTICOAGULATION CONSULT NOTE - Follow Up Consult  Pharmacy Consult for Heparin Indication: atrial fibrillation  Allergies  Allergen Reactions   Penicillins Anaphylaxis and Other (See Comments)    Has patient had a PCN reaction causing immediate rash, facial/tongue/throat swelling, SOB or lightheadedness with hypotension: Yes Has patient had a PCN reaction causing severe rash involving mucus membranes or skin necrosis: No Has patient had a PCN reaction that required hospitalization: Yes Has patient had a PCN reaction occurring within the last 10 years: No If all of the above answers are "NO", then may proceed with Cephalosporin use.    Demerol Nausea And Vomiting   Meperidine Nausea And Vomiting   Morphine And Related Nausea And Vomiting    Patient Measurements: Height: 5\' 6"  (167.6 cm) Weight: (!) 172.7 kg (380 lb 11.8 oz) IBW/kg (Calculated) : 59.3 Heparin Dosing Weight: 103 kg  Vital Signs: Temp: 98.5 F (36.9 C) (07/19 0000) Temp Source: Oral (07/19 0000) BP: 118/60 (07/19 0400) Pulse Rate: 92 (07/19 0400)  Labs: Recent Labs    03/22/21 0629 03/22/21 0746 03/22/21 0829 03/22/21 1130 03/22/21 1418 03/22/21 2111 03/23/21 0454 03/23/21 1133 03/24/21 0316 03/24/21 1017 03/24/21 1539 03/25/21 0250  HGB  --    < >  --  14.0  --   --  12.8  --  12.6  --   --  11.9*  HCT  --    < >  --  47.3*  --   --  42.3  --  42.0  --   --  40.5  PLT  --    < >  --  203  --   --  117*  --  120*  --   --  118*  APTT  --   --   --   --  24  --   --   --   --   --   --   --   LABPROT  --   --   --   --  14.0  --   --   --   --   --   --   --   INR  --   --   --   --  1.1  --   --   --   --   --   --   --   HEPARINUNFRC  --   --   --   --   --    < > 0.24*   < > 0.25* 0.33 0.35 0.19*  CREATININE  --    < >  --  1.12*  --   --  1.71*  --  1.32*  --   --  1.10*  TROPONINIHS 9  --  10 9  --   --   --   --   --   --   --   --    < > = values in this interval not displayed.      Estimated Creatinine Clearance: 83.2 mL/min (A) (by C-G formula based on SCr of 1.1 mg/dL (H)).   Medications:  Infusions:   heparin 2,100 Units/hr (03/25/21 0617)   lactated ringers 50 mL/hr at 03/25/21 0600    Assessment: 15 yoF presented to the ER with complaints of having diarrhea and exertional shortness of breath for the last 3 to 4 days, along with chronic lower extremity edema.  Admitted for acute hypoxic and hypercapnic respiratory failure with diarrhea with new onset atrial fibrillation.  D-dimer elevated and CHA2DS2-VASc score of at least 2.  Pharmacy consulted to dose heparin for atrial fibrillation.  Today, 03/25/2021: Heparin level 0.19, subtherapeutic on heparin at 2100 unit/hr CBC: Hgb down to 11.9, Plt 118k - Note, on 7/17 42% platelet fall on day 2 of heparin with no known previous exposure, not likely antibody mediated No bleeding or complications reported, and no interruption in therapy noted by RN.    Goal of Therapy:  Heparin level 0.3-0.7 units/ml Monitor platelets by anticoagulation protocol: Yes   Plan:  Increase heparin IV infusion to 2300 units/hr Heparin level 6 hours after rate increase Daily heparin level and CBC   Leone Haven, PharmD 03/25/2021 6:24 AM

## 2021-03-25 NOTE — Progress Notes (Addendum)
Patient ID: [name], female   DOB: 10/29/1955, 65 y.o.   MRN: [id number]  PROGRESS NOTE    [name]  HDQ:[id number] DOB: 1956/01/13 DOA: 03/22/2021 PCP: Sandi Mariscal, MD   Brief Narrative:  65 y.o. female with history of hypertension, hyperlipidemia, hypothyroidism presented with diarrhea along with worsening shortness of breath and lower extremity edema.  On presentation, she was found to be hypoxic and ABG showed hypercapnic respiratory failure along with elevated D-dimer.  CT angiogram was attempted twice but she was unable to lie down flat.  She was placed on BiPAP; chest x-ray showed congestion.  She was also found to have A. fib with RVR for which she was started on Cardizem drip and heparin drip.  COVID-19 test was negative. PCCM was consulted.  Cardiology was also subsequently consulted.  Assessment & Plan:   Acute respiratory failure with hypoxia and hypercapnia -Possibly from a combination of obesity hypoventilation/sleep apnea probably undiagnosed;?  Component of CHF -CT angiogram was attempted twice on presentation but she was unable to lie down flat on presentation.  COVID-19 test was negative.  Chest x-ray showed congestion on presentation. -Patient was hypercapnic on presentation.   -Required BIPAP initially, mostly at night. Currently on 2L oxygen via nasal cannula. Wean off as able -PCCM following -Continue heparin drip. -DC VQ scan order and try to attempt CT angiogram of chest today  Paroxysmal A. fib with RVR -Still intermittently tachycardic. Off cardizem drip.  Currently on heparin drip.  Cardiology following and planning to switch heparin to oral Eliquis.  Continue oral metoprolol.  Echo showed EF of 60-65%. Lower extremity duplex was negative for DVT.  AKI -improving with gentle hydration.  DC IV fluids.  Monitor  Diarrhea -Questionable cause.  No diarrhea since admission. DC'd isolation  Hypertension -Blood pressure on the lower side.  off Cardizem  drip  Hypothyroidism -On Synthroid at home.  Resume if able to take orally  Leukocytosis -Resolved  Thrombocytopenia -Questionable cause.  Monitor.  No signs of bleeding  Hyperlipidemia - will resume statin once able to take orally consistently.   DVT prophylaxis: Heparin drip Code Status: Full Family Communication: None at bedside Disposition Plan: Status is: Inpatient  Remains inpatient appropriate because:Inpatient level of care appropriate due to severity of illness  Dispo: The patient is from: Home              Anticipated d/c is to: Home              Patient currently is not medically stable to d/c.   Difficult to place patient No   Consultants: PCCM/cardiology  Procedures: Echo as below  Antimicrobials: None   Subjective: Patient seen and examined at bedside.  No overnight fever, chest pain or vomiting reported.  Feeling slightly better but still short of breath with exertion. Objective: Vitals:   03/25/21 0000 03/25/21 0200 03/25/21 0400 03/25/21 0600  BP: (!) 145/79 120/70 118/60 (!) 128/96  Pulse: 92 92 92 89  Resp: 18 (!) 23 (!) 26 14  Temp: 98.5 F (36.9 C)     TempSrc: Oral     SpO2: 95% 98% 97% 95%  Weight:    (!) 172.7 kg  Height:        Intake/Output Summary (Last 24 hours) at 03/25/2021 0722 Last data filed at 03/25/2021 0600 Gross per 24 hour  Intake 1693.82 ml  Output 600 ml  Net 1093.82 ml    Filed Weights   03/23/21 0443 03/24/21 0435 03/25/21  0600  Weight: (!) 169.9 kg (!) 171 kg (!) 172.7 kg    Examination:  General exam: Looks chronically ill and deconditioned. Poor historian.  No acute distress.  Currently on 2 L oxygen via nasal cannula  ENT: No elevated JVD  respiratory system: Bilateral decreased breath sounds at bases Intermittently tachypneic  cardiovascular system: S1-S2 positive, currently rate controlled Gastrointestinal system: Abdomen is morbidly obese, slightly distended, soft and nontender.  Normal bowel sounds  heard  extremities: No clubbing; lower extremity edema is present bilaterally  Central nervous system: Awake and alert.  No focal neurological deficits.  Moving extremities  skin: No obvious ecchymosis/lesions  psychiatry: Affect is flat    Data Reviewed: I have personally reviewed following labs and imaging studies  CBC: Recent Labs  Lab 03/22/21 0241 03/22/21 0620 03/22/21 0746 03/22/21 1130 03/23/21 0454 03/24/21 0316 03/25/21 0250  WBC 11.3*  --   --  12.1* 11.9* 7.6 7.2  NEUTROABS 8.4*  --   --  9.8*  --   --   --   HGB 13.7   < > 15.0 14.0 12.8 12.6 11.9*  HCT 44.4   < > 44.0 47.3* 42.3 42.0 40.5  MCV 89.7  --   --  95.4 94.0 94.6 95.1  PLT 171  --   --  203 117* 120* 118*   < > = values in this interval not displayed.    Basic Metabolic Panel: Recent Labs  Lab 03/22/21 0241 03/22/21 0620 03/22/21 0746 03/22/21 1130 03/23/21 0454 03/24/21 0316 03/25/21 0250  NA 140   < > 143 142 139 139 139  K 3.4*   < > 3.8 3.9 4.1 4.0 3.6  CL 102  --   --  102 101 102 103  CO2 29  --   --  32 29 28 26   GLUCOSE 123*  --   --  137* 111* 86 83  BUN 12  --   --  16 21 27* 27*  CREATININE 0.75  --   --  1.12* 1.71* 1.32* 1.10*  CALCIUM 8.8*  --   --  8.6* 8.1* 8.4* 8.7*  MG  --   --   --   --   --  2.0 1.9   < > = values in this interval not displayed.    GFR: Estimated Creatinine Clearance: 83.2 mL/min (A) (by C-G formula based on SCr of 1.1 mg/dL (H)). Liver Function Tests: Recent Labs  Lab 03/22/21 0241 03/22/21 1130 03/24/21 0316  AST 14* 19 18  ALT 11 17 17   ALKPHOS 48 52 47  BILITOT 0.6 0.8 0.6  PROT 7.3 8.0 7.2  ALBUMIN 3.5 3.4* 3.1*    Recent Labs  Lab 03/22/21 0241  LIPASE <10*    No results for input(s): AMMONIA in the last 168 hours. Coagulation Profile: Recent Labs  Lab 03/22/21 1418  INR 1.1    Cardiac Enzymes: No results for input(s): CKTOTAL, CKMB, CKMBINDEX, TROPONINI in the last 168 hours. BNP (last 3 results) No results for  input(s): PROBNP in the last 8760 hours. HbA1C: No results for input(s): HGBA1C in the last 72 hours. CBG: Recent Labs  Lab 03/23/21 2330 03/24/21 0558 03/24/21 1218 03/25/21 0000 03/25/21 0601  GLUCAP 93 79 100* 87 90    Lipid Profile: No results for input(s): CHOL, HDL, LDLCALC, TRIG, CHOLHDL, LDLDIRECT in the last 72 hours. Thyroid Function Tests: Recent Labs    03/24/21 1539  TSH 3.494    Anemia  Panel: No results for input(s): VITAMINB12, FOLATE, FERRITIN, TIBC, IRON, RETICCTPCT in the last 72 hours. Sepsis Labs: Recent Labs  Lab 03/22/21 1142 03/22/21 1435  LATICACIDVEN 1.2 1.5     Recent Results (from the past 240 hour(s))  Resp Panel by RT-PCR (Flu A&B, Covid) Nasopharyngeal Swab     Status: None   Collection Time: 03/22/21  5:20 AM   Specimen: Nasopharyngeal Swab; Nasopharyngeal(NP) swabs in vial transport medium  Result Value Ref Range Status   SARS Coronavirus 2 by RT PCR NEGATIVE NEGATIVE Final    Comment: (NOTE) SARS-CoV-2 target nucleic acids are NOT DETECTED.  The SARS-CoV-2 RNA is generally detectable in upper respiratory specimens during the acute phase of infection. The lowest concentration of SARS-CoV-2 viral copies this assay can detect is 138 copies/mL. A negative result does not preclude SARS-Cov-2 infection and should not be used as the sole basis for treatment or other patient management decisions. A negative result may occur with  improper specimen collection/handling, submission of specimen other than nasopharyngeal swab, presence of viral mutation(s) within the areas targeted by this assay, and inadequate number of viral copies(<138 copies/mL). A negative result must be combined with clinical observations, patient history, and epidemiological information. The expected result is Negative.  Fact Sheet for Patients:  EntrepreneurPulse.com.au  Fact Sheet for Healthcare Providers:   IncredibleEmployment.be  This test is no t yet approved or cleared by the [place] FDA and  has been authorized for detection and/or diagnosis of SARS-CoV-2 by FDA under an Emergency Use Authorization (EUA). This EUA will remain  in effect (meaning this test can be used) for the duration of the COVID-19 declaration under Section 564(b)(1) of the Act, 21 U.S.C.section 360bbb-3(b)(1), unless the authorization is terminated  or revoked sooner.       Influenza A by PCR NEGATIVE NEGATIVE Final   Influenza B by PCR NEGATIVE NEGATIVE Final    Comment: (NOTE) The Xpert Xpress SARS-CoV-2/FLU/RSV plus assay is intended as an aid in the diagnosis of influenza from Nasopharyngeal swab specimens and should not be used as a sole basis for treatment. Nasal washings and aspirates are unacceptable for Xpert Xpress SARS-CoV-2/FLU/RSV testing.  Fact Sheet for Patients: EntrepreneurPulse.com.au  Fact Sheet for Healthcare Providers: IncredibleEmployment.be  This test is not yet approved or cleared by the [place] FDA and has been authorized for detection and/or diagnosis of SARS-CoV-2 by FDA under an Emergency Use Authorization (EUA). This EUA will remain in effect (meaning this test can be used) for the duration of the COVID-19 declaration under Section 564(b)(1) of the Act, 21 U.S.C. section 360bbb-3(b)(1), unless the authorization is terminated or revoked.  Performed at [facility], [address] [place], [place] [address]   Culture, blood (routine x 2)     Status: None (Preliminary result)   Collection Time: 03/22/21 12:10 PM   Specimen: BLOOD  Result Value Ref Range Status   Specimen Description   Final    BLOOD LEFT ARM Performed at [facility] [address], [place], [place] [address]    Special Requests   Final    BOTTLES DRAWN AEROBIC AND ANAEROBIC Blood Culture results may  not be optimal due to an inadequate volume of blood received in culture bottles Performed at [facility] [address] [place], [place] [address]    Culture   Final    NO GROWTH 3 DAYS Performed at [facility], [place] [address] [place], [place] [address]  Report Status PENDING  Incomplete  Culture, blood (routine x 2)     Status: None (Preliminary result)   Collection Time: 03/22/21 12:10 PM   Specimen: BLOOD  Result Value Ref Range Status   Specimen Description   Final    BLOOD LEFT HAND Performed at [facility] [address], [place], [place] [address]    Special Requests   Final    BOTTLES DRAWN AEROBIC AND ANAEROBIC Blood Culture results may not be optimal due to an inadequate volume of blood received in culture bottles Performed at [facility] [address], [place], [place] [address]    Culture   Final    NO GROWTH 3 DAYS Performed at [facility], [place] [address] [place], [place] [address]    Report Status PENDING  Incomplete  MRSA Next Gen by PCR, Nasal     Status: None   Collection Time: 03/22/21  2:00 PM   Specimen: Nasal Mucosa; Nasal Swab  Result Value Ref Range Status   MRSA by PCR Next Gen NOT DETECTED NOT DETECTED Final    Comment: (NOTE) The GeneXpert MRSA Assay (FDA approved for NASAL specimens only), is one component of a comprehensive MRSA colonization surveillance program. It is not intended to diagnose MRSA infection nor to guide or monitor treatment for MRSA infections. Test performance is not FDA approved in patients less than 40 years old. Performed at [facility], [place] [address], [place], [place] [address]           Radiology Studies: NM Pulmonary Perf and Vent  Result Date: 03/24/2021 Lenore Manner     03/24/2021  4:52 PM VQ SCAN WILL BE ATTEMPTED TUES AM IF RENAL LABS ARE STILL ELEVATED PER DR Starla Link. PT UNABLE TO LIE FLAT  FOR CT - WILL  RE EVALUATE THIS TOMORROW. OK TO HOLD EXAM FOR NOW PER DR OLALERE / BLS N[facility] MED [phone]   VAS [place] LOWER EXTREMITY VENOUS (DVT)  Result Date: 03/23/2021  Lower Venous DVT Study Patient Name:  ATHALIE NEWHARD  Date of Exam:   03/23/2021 Medical Rec #: [id number]      Accession #:    [id number] Date of Birth: 10/11/1955      Patient Gender: F Patient Age:   65Y Exam Location:  [facility] Procedure:      VAS [place] LOWER EXTREMITY VENOUS (DVT) Referring Phys: [id number] --------------------------------------------------------------------------------  Indications: Edema and SOB.  Anticoagulation: Heparin. Limitations: Today's examination was severely limited due to patient body habitus, pain intolerance, and inability to reposition. Comparison Study: 03-16-2019 Prior bilateral lower extremity venous was limited                   bu negative for DVT. Performing Technologist: Darlin Coco RDMS,RVT  Examination Guidelines: A complete evaluation includes B-mode imaging, spectral Doppler, color Doppler, and power Doppler as needed of all accessible portions of each vessel. Bilateral testing is considered an integral part of a complete examination. Limited examinations for reoccurring indications may be performed as noted. The reflux portion of the exam is performed with the patient in reverse Trendelenburg.  +---------+---------------+---------+-----------+----------+-------------------+ RIGHT    CompressibilityPhasicitySpontaneityPropertiesThrombus Aging      +---------+---------------+---------+-----------+----------+-------------------+ CFV      Full           Yes      Yes                                      +---------+---------------+---------+-----------+----------+-------------------+  SFJ      Full                                                             +---------+---------------+---------+-----------+----------+-------------------+ FV Prox  Full                                                              +---------+---------------+---------+-----------+----------+-------------------+ FV Mid   Full           Yes      Yes                                      +---------+---------------+---------+-----------+----------+-------------------+ FV Distal               Yes      Yes                                      +---------+---------------+---------+-----------+----------+-------------------+ PFV      Full                                                             +---------+---------------+---------+-----------+----------+-------------------+ POP      Full                                         Unable to examine                                                         popliteal fossa in                                                        sagittal plane due                                                        to positioning/pain +---------+---------------+---------+-----------+----------+-------------------+ PTV                                                   Not  visualized      +---------+---------------+---------+-----------+----------+-------------------+ PERO                                                  Not visualized      +---------+---------------+---------+-----------+----------+-------------------+   +---------+---------------+---------+-----------+----------+--------------+ LEFT     CompressibilityPhasicitySpontaneityPropertiesThrombus Aging +---------+---------------+---------+-----------+----------+--------------+ CFV      Full           Yes      Yes                                 +---------+---------------+---------+-----------+----------+--------------+ SFJ      Full                                                        +---------+---------------+---------+-----------+----------+--------------+ FV Prox  Full                                                         +---------+---------------+---------+-----------+----------+--------------+ FV Mid   Full           Yes      Yes                                 +---------+---------------+---------+-----------+----------+--------------+ FV Distal               Yes      Yes                                 +---------+---------------+---------+-----------+----------+--------------+ PFV      Full                                                        +---------+---------------+---------+-----------+----------+--------------+ POP      Full           Yes      Yes                                 +---------+---------------+---------+-----------+----------+--------------+ PTV                                                   Not visualized +---------+---------------+---------+-----------+----------+--------------+ PERO                                                  Not visualized +---------+---------------+---------+-----------+----------+--------------+     Summary:  RIGHT: - There is no evidence of deep vein thrombosis in the lower extremity. However, portions of this examination were limited- see technologist comments above.  LEFT: - There is no evidence of deep vein thrombosis in the lower extremity. However, portions of this examination were limited- see technologist comments above.  - No cystic structure found in the popliteal fossa.  *See table(s) above for measurements and observations. Electronically signed by Monica Martinez MD on 03/23/2021 at 2:56:53 PM.    Final         Scheduled Meds:  chlorhexidine  15 mL Mouth Rinse BID   Chlorhexidine Gluconate Cloth  6 each Topical Daily   mouth rinse  15 mL Mouth Rinse q12n4p   metoprolol tartrate  25 mg Oral BID   Continuous Infusions:  heparin 2,300 Units/hr (03/25/21 1103)   lactated ringers 50 mL/hr at 03/25/21 0600          Jahzier Villalon Starla Link, MD [facility] 03/25/2021, 7:22 AM

## 2021-03-25 NOTE — Progress Notes (Signed)
NAME:  [name], MRN:  [id number], DOB:  02/13/1956, LOS: 3 ADMISSION DATE:  03/22/2021, CONSULTATION DATE:  03/23/21 REFERRING MD:  [facility], Dr Starla Link, CHIEF COMPLAINT:  Respiratory distress, requiring BIPAP   History of Present Illness:  Patient is on BiPAP, so history is obtained from chart review.  Presented to the ED with a history of 4 days of loose stools, and poor p.o. intake with right-sided abdominal pain.  Given pain medication and sent to radiology for CT abdomen/pelvis but unable to tolerate it due to pain in her back and hips.  She was then noted to be hypoxic and placed on 5 L oxygen.  ABG showed 7.1 8/82/124. She also developed atrial fibrillation with RVR, placed on Cardizem for rate control, BiPAP and admitted to ICU Due to elevated D-dimer, CT angiogram chest was attempted but she was unable to lie flat, started on IV heparin. Reportedly there is a history of chronic diarrhea, placed on enteric precautions PCCM consulted 7/17 for BiPAP management and persistent acute respiratory acidosis  Pertinent  Medical History   Past Medical History:  Diagnosis Date   Anemia    Arthritis    AVN (avascular necrosis of bone) ([place]) 04/14/2013   Chronic kidney disease    Complication of anesthesia    Decreased mobility    hx. Rt. hip "avascular necrosis" -unable to weight bear long periods, "using wheelchair"..   Depression    GERD (gastroesophageal reflux disease)    Hep B w/o coma    Hep C w/o coma, chronic ([place])    07-20-14 being presently tx. "Harvoni"-good response per pt. will complete in 7days- Dr. Linus Salmons follows [place] [facility].   Hypertension    Hypothyroid    mild- no medication   Knee stiffness    right knee -hard to bend at knee   Muscle cramps    Obesity    Osteoporosis    Pneumonia    PONV (postoperative nausea and vomiting)    only with Demerol   Shortness of breath dyspnea    still smokes   Superficial thrombophlebitis    left leg    Transfusion history    70's, 80's ? hepatitis C attributed to past transfusions     Significant Hospital Events: Including procedures, antibiotic start and stop dates in addition to other pertinent events   No overnight events   Interim History / Subjective:  Out of bed in chair Remains on room air Transition to oral anticoagulant for A. fib  Objective   Blood pressure 118/77, pulse (!) 104, temperature (!) 97.4 F (36.3 C), temperature source Oral, resp. rate (!) 22, height 5\' 6"  (1.676 m), weight (!) 172.7 kg, SpO2 92 %.        Intake/Output Summary (Last 24 hours) at 03/25/2021 0919 Last data filed at 03/25/2021 0753 Gross per 24 hour  Intake 1573.82 ml  Output 800 ml  Net 773.82 ml   Filed Weights   03/23/21 0443 03/24/21 0435 03/25/21 0600  Weight: (!) 169.9 kg (!) 171 kg (!) 172.7 kg    Examination: General: Middle-aged, morbidly obese  HENT: Anicteric, moist oral mucosa  Lungs: Decreased air movement at the bases bilaterally  Cardiovascular: Irregularly irregular pulse, S1-S2 appreciated Abdomen: Soft, bowel sounds appreciated Extremities: Mild edema Neuro: Alert and oriented x3 GU: [facility] Problem list     Assessment & Plan:  Acute on chronic hypoxic and hypercarbic respiratory failure -May have been precipitated and compounded  by pain medications -Does not appear to require BiPAP during the day -Mental status continues to improve  AKI -Cautious hydration  Atrial fibrillation with RVR -Transitioned to apixaban -Metoprolol 50 twice daily -Additional risk with atrial fibrillation which is new  -Pending CT angio for PE  She should consider further evaluation for possible obstructive sleep apnea, obesity hypoventilation -Very limited at baseline, not able to get around easily -Further evaluation when more stable  -May continue to manage off BiPAP -ABG did show significant improvement  Best Practice (right click and  "Reselect all SmartList Selections" daily)   Diet/type: Regular consistency (see orders) DVT prophylaxis: systemic heparin GI prophylaxis: N/A Lines: N/A Foley:  N/A Code Status:  full code Last date of multidisciplinary goals of care discussion [per primary service]  Labs   CBC: Recent Labs  Lab 03/22/21 0241 03/22/21 0620 03/22/21 0746 03/22/21 1130 03/23/21 0454 03/24/21 0316 03/25/21 0250  WBC 11.3*  --   --  12.1* 11.9* 7.6 7.2  NEUTROABS 8.4*  --   --  9.8*  --   --   --   HGB 13.7   < > 15.0 14.0 12.8 12.6 11.9*  HCT 44.4   < > 44.0 47.3* 42.3 42.0 40.5  MCV 89.7  --   --  95.4 94.0 94.6 95.1  PLT 171  --   --  203 117* 120* 118*   < > = values in this interval not displayed.    Basic Metabolic Panel: Recent Labs  Lab 03/22/21 0241 03/22/21 0620 03/22/21 0746 03/22/21 1130 03/23/21 0454 03/24/21 0316 03/25/21 0250  NA 140   < > 143 142 139 139 139  K 3.4*   < > 3.8 3.9 4.1 4.0 3.6  CL 102  --   --  102 101 102 103  CO2 29  --   --  32 29 28 26   GLUCOSE 123*  --   --  137* 111* 86 83  BUN 12  --   --  16 21 27* 27*  CREATININE 0.75  --   --  1.12* 1.71* 1.32* 1.10*  CALCIUM 8.8*  --   --  8.6* 8.1* 8.4* 8.7*  MG  --   --   --   --   --  2.0 1.9   < > = values in this interval not displayed.   GFR: Estimated Creatinine Clearance: 83.2 mL/min (A) (by C-G formula based on SCr of 1.1 mg/dL (H)). Recent Labs  Lab 03/22/21 1130 03/22/21 1142 03/22/21 1435 03/23/21 0454 03/24/21 0316 03/25/21 0250  WBC 12.1*  --   --  11.9* 7.6 7.2  LATICACIDVEN  --  1.2 1.5  --   --   --     Liver Function Tests: Recent Labs  Lab 03/22/21 0241 03/22/21 1130 03/24/21 0316  AST 14* 19 18  ALT 11 17 17   ALKPHOS 48 52 47  BILITOT 0.6 0.8 0.6  PROT 7.3 8.0 7.2  ALBUMIN 3.5 3.4* 3.1*   Recent Labs  Lab 03/22/21 0241  LIPASE <10*   No results for input(s): AMMONIA in the last 168 hours.  ABG    Component Value Date/Time   PHART 7.343 (L) 03/24/2021 1127    PCO2ART 49.1 (H) 03/24/2021 1127   PO2ART 66.9 (L) 03/24/2021 1127   HCO3 26.0 03/24/2021 1127   TCO2 33 (H) 03/22/2021 0746   ACIDBASEDEF 0.3 03/23/2021 0815   O2SAT 91.4 03/24/2021 1127     Coagulation Profile: Recent  Labs  Lab 03/22/21 1418  INR 1.1    Cardiac Enzymes: No results for input(s): CKTOTAL, CKMB, CKMBINDEX, TROPONINI in the last 168 hours.  HbA1C: Hemoglobin A1C  Date/Time Value Ref Range Status  03/24/2018 02:02 PM 5.6 4.0 - 5.6 % Final  08/26/2015 05:20 PM 5.70  Final   Hgb A1c MFr Bld  Date/Time Value Ref Range Status  09/01/2012 10:38 AM 5.7 (H) <5.7 % Final    Comment:    (NOTE)                                                                       According to the ADA Clinical Practice Recommendations for 2011, when HbA1c is used as a screening test:  >=6.5%   Diagnostic of Diabetes Mellitus           (if abnormal result is confirmed) 5.7-6.4%   Increased risk of developing Diabetes Mellitus References:Diagnosis and Classification of Diabetes Mellitus,Diabetes IOEV,0350,09(FGHWE 1):S62-S69 and Standards of Medical Care in         Diabetes - 2011,Diabetes XHBZ,1696,78 (Suppl 1):S11-S61.  07/25/2009 09:31 PM 5.6 4.6 - 6.1 % Final    Comment:    See lab report for associated comment(s)    CBG: Recent Labs  Lab 03/23/21 2330 03/24/21 0558 03/24/21 1218 03/25/21 0000 03/25/21 0601  GLUCAP 93 79 100* 87 90    Review of Systems:   Denies any significant symptoms at present  Past Medical History:  She,  has a past medical history of Anemia, Arthritis, AVN (avascular necrosis of bone) ([place]) (04/14/2013), Chronic kidney disease, Complication of anesthesia, Decreased mobility, Depression, GERD (gastroesophageal reflux disease), Hep B w/o coma, Hep C w/o coma, chronic ([place]), Hypertension, Hypothyroid, Knee stiffness, Muscle cramps, Obesity, Osteoporosis, Pneumonia, PONV (postoperative nausea and vomiting), Shortness of breath dyspnea, Superficial  thrombophlebitis, and Transfusion history.   Surgical History:   Past Surgical History:  Procedure Laterality Date   ABDOMINAL HYSTERECTOMY     APPENDECTOMY     thinks it was removed with hysterectomy   arthroscopies     knee-right x 7   CHOLECYSTECTOMY     COLON SURGERY     COLONOSCOPY     COLONOSCOPY WITH PROPOFOL N/A 02/26/2015   Procedure: COLONOSCOPY WITH PROPOFOL;  Surgeon: Inda Castle, MD;  Location: [facility] END[facility]COPY;  Service: Endoscopy;  Laterality: N/A;   COLONOSCOPY WITH PROPOFOL N/A 03/03/2016   Procedure: COLONOSCOPY WITH PROPOFOL;  Surgeon: Mauri Pole, MD;  Location: [facility] ENDOSCOPY;  Service: Endoscopy;  Laterality: N/A;   ESOPHAGOGASTRODUODENOSCOPY (EGD) WITH PROPOFOL N/A 03/03/2016   Procedure: ESOPHAGOGASTRODUODENOSCOPY (EGD) WITH PROPOFOL;  Surgeon: Mauri Pole, MD;  Location: [facility] ENDOSCOPY;  Service: Endoscopy;  Laterality: N/A;   JOINT REPLACEMENT     right knee   TOTAL KNEE ARTHROPLASTY       Social History:   reports that she has been smoking cigarettes. She has a 10.00 pack-year smoking history. She has never used smokeless tobacco. She reports that she does not drink alcohol and does not use drugs.   Family History:  Her family history includes Cancer in her father and mother; Diabetes in her brother and mother; Heart disease in her mother; Hypertension in her brother, mother, and sister. There is  no history of Colon cancer.   Allergies Allergies  Allergen Reactions   Penicillins Anaphylaxis and Other (See Comments)    Has patient had a PCN reaction causing immediate rash, facial/tongue/throat swelling, SOB or lightheadedness with hypotension: Yes Has patient had a PCN reaction causing severe rash involving mucus membranes or skin necrosis: No Has patient had a PCN reaction that required hospitalization: Yes Has patient had a PCN reaction occurring within the last 10 years: No If all of the above answers are "NO", then may proceed with  Cephalosporin use.    Demerol Nausea And Vomiting   Meperidine Nausea And Vomiting   Morphine And Related Nausea And Vomiting     Home Medications  Prior to Admission medications   Medication Sig Start Date End Date Taking? Authorizing Provider  escitalopram (LEXAPRO) 10 MG tablet TAKE 1 TABLET BY MOUTH EVERY DAY. Need TO keep appointment in June FOR future refills Patient taking differently: Take 10 mg by mouth daily. 12/13/19  Yes Kerin Perna, NP  levothyroxine (SYNTHROID) 75 MCG tablet Take 75 mcg by mouth daily before breakfast.   Yes [provider]  lisinopril (ZESTRIL) 40 MG tablet Take 1 tablet (40 mg total) by mouth at bedtime. Patient taking differently: Take 40 mg by mouth daily. 10/18/20  Yes Kerin Perna, NP  omeprazole (PRILOSEC) 40 MG capsule Take 1 capsule (40 mg total) by mouth daily as needed (acid reflux). 10/16/19  Yes Kerin Perna, NP    The patient is critically ill with multiple organ systems failure and requires high complexity decision making for assessment and support, frequent evaluation and titration of therapies, application of advanced monitoring technologies and extensive interpretation of multiple databases. Critical Care Time devoted to patient care services described in this note independent of APP/resident time (if applicable)  is 30 minutes.   Sherrilyn Rist MD [facility] Personal pager: See Amion If unanswered, please page CCM On-call: [phone]

## 2021-03-26 ENCOUNTER — Inpatient Hospital Stay (HOSPITAL_COMMUNITY): Payer: Medicare HMO | Admitting: Anesthesiology

## 2021-03-26 ENCOUNTER — Encounter (HOSPITAL_COMMUNITY)
Admission: EM | Disposition: A | Discharge status: Home / Self Care | Payer: Self-pay | Source: Home / Self Care | Attending: Internal Medicine

## 2021-03-26 ENCOUNTER — Inpatient Hospital Stay (HOSPITAL_COMMUNITY): Payer: Medicare HMO

## 2021-03-26 ENCOUNTER — Encounter (HOSPITAL_COMMUNITY): Payer: Self-pay | Admitting: Internal Medicine

## 2021-03-26 DIAGNOSIS — E039 Hypothyroidism, unspecified: Secondary | ICD-10-CM

## 2021-03-26 DIAGNOSIS — I5033 Acute on chronic diastolic (congestive) heart failure: Secondary | ICD-10-CM

## 2021-03-26 DIAGNOSIS — I4891 Unspecified atrial fibrillation: Secondary | ICD-10-CM

## 2021-03-26 DIAGNOSIS — I10 Essential (primary) hypertension: Secondary | ICD-10-CM

## 2021-03-26 DIAGNOSIS — I340 Nonrheumatic mitral (valve) insufficiency: Secondary | ICD-10-CM

## 2021-03-26 HISTORY — PX: TEE WITHOUT CARDIOVERSION: SHX5443

## 2021-03-26 HISTORY — PX: CARDIOVERSION: SHX1299

## 2021-03-26 LAB — BASIC METABOLIC PANEL
Anion gap: 10 (ref 5–15)
BUN: 22 mg/dL (ref 8–23)
CO2: 26 mmol/L (ref 22–32)
Calcium: 8.8 mg/dL — ABNORMAL LOW (ref 8.9–10.3)
Chloride: 103 mmol/L (ref 98–111)
Creatinine, Ser: 0.91 mg/dL (ref 0.44–1.00)
GFR, Estimated: >60 mL/min (ref >60)
Glucose, Bld: 116 mg/dL — ABNORMAL HIGH (ref 70–99)
Potassium: 3.4 mmol/L — ABNORMAL LOW (ref 3.5–5.1)
Sodium: 139 mmol/L (ref 135–145)

## 2021-03-26 LAB — GLUCOSE, CAPILLARY
Glucose-Capillary: 107 mg/dL — ABNORMAL HIGH (ref 70–99)
Glucose-Capillary: 111 mg/dL — ABNORMAL HIGH (ref 70–99)
Glucose-Capillary: 140 mg/dL — ABNORMAL HIGH (ref 70–99)
Glucose-Capillary: 149 mg/dL — ABNORMAL HIGH (ref 70–99)

## 2021-03-26 LAB — CBC
HCT: 41.7 % (ref 36.0–46.0)
Hemoglobin: 12.6 g/dL (ref 12.0–15.0)
MCH: 28.2 pg (ref 26.0–34.0)
MCHC: 30.2 g/dL (ref 30.0–36.0)
MCV: 93.3 fL (ref 80.0–100.0)
Platelets: 130 10*3/uL — ABNORMAL LOW (ref 150–400)
RBC: 4.47 MIL/uL (ref 3.87–5.11)
RDW: 15.6 % — ABNORMAL HIGH (ref 11.5–15.5)
WBC: 6.7 10*3/uL (ref 4.0–10.5)
nRBC: 0 % (ref 0.0–0.2)

## 2021-03-26 LAB — MAGNESIUM: Magnesium: 1.8 mg/dL (ref 1.7–2.4)

## 2021-03-26 SURGERY — ECHOCARDIOGRAM, TRANSESOPHAGEAL
Anesthesia: Monitor Anesthesia Care

## 2021-03-26 MED ORDER — AMIODARONE HCL 200 MG PO TABS
200.0000 mg | ORAL_TABLET | Freq: Two times a day (BID) | ORAL | Status: DC
  Administered 2021-03-26 – 2021-03-27 (×3): 200 mg via ORAL
  Filled 2021-03-26 (×3): qty 1

## 2021-03-26 MED ORDER — POTASSIUM CHLORIDE CRYS ER 20 MEQ PO TBCR
40.0000 meq | EXTENDED_RELEASE_TABLET | Freq: Once | ORAL | Status: AC
  Administered 2021-03-26: 40 meq via ORAL
  Filled 2021-03-26: qty 2

## 2021-03-26 MED ORDER — LEVOTHYROXINE SODIUM 75 MCG PO TABS
75.0000 ug | ORAL_TABLET | Freq: Every day | ORAL | Status: DC
  Administered 2021-03-27: 75 ug via ORAL
  Filled 2021-03-26: qty 1

## 2021-03-26 MED ORDER — ESCITALOPRAM OXALATE 20 MG PO TABS
40.0000 mg | ORAL_TABLET | Freq: Every day | ORAL | Status: DC
  Administered 2021-03-26: 40 mg via ORAL
  Filled 2021-03-26: qty 2

## 2021-03-26 MED ORDER — PROPOFOL 10 MG/ML IV BOLUS
INTRAVENOUS | Status: DC | PRN
  Administered 2021-03-26: 20 mg via INTRAVENOUS

## 2021-03-26 MED ORDER — ATORVASTATIN CALCIUM 40 MG PO TABS
40.0000 mg | ORAL_TABLET | Freq: Every day | ORAL | Status: DC
  Administered 2021-03-27: 40 mg via ORAL
  Filled 2021-03-26 (×2): qty 1

## 2021-03-26 MED ORDER — MAGNESIUM SULFATE 2 GM/50ML IV SOLN
2.0000 g | Freq: Once | INTRAVENOUS | Status: AC
  Administered 2021-03-26: 2 g via INTRAVENOUS
  Filled 2021-03-26: qty 50

## 2021-03-26 MED ORDER — ORAL CARE MOUTH RINSE: 15.0000 mL | Freq: Two times a day (BID) | OROMUCOSAL | Status: DC

## 2021-03-26 MED ORDER — PROPOFOL 500 MG/50ML IV EMUL
INTRAVENOUS | Status: DC | PRN
  Administered 2021-03-26: 120 ug/kg/min via INTRAVENOUS

## 2021-03-26 MED ORDER — AMIODARONE HCL 200 MG PO TABS: 200.0000 mg | ORAL_TABLET | Freq: Every day | ORAL | Status: DC

## 2021-03-26 MED ORDER — LIDOCAINE 2% (20 MG/ML) 5 ML SYRINGE
INTRAMUSCULAR | Status: DC | PRN
  Administered 2021-03-26: 40 mg via INTRAVENOUS
  Administered 2021-03-26: 60 mg via INTRAVENOUS

## 2021-03-26 MED ORDER — SODIUM CHLORIDE 0.9 % IV SOLN
INTRAVENOUS | Status: DC
Start: 2021-03-26 — End: 2021-03-26

## 2021-03-26 MED ORDER — FUROSEMIDE 10 MG/ML IJ SOLN: 40.0000 mg | Freq: Once | INTRAMUSCULAR | Status: DC

## 2021-03-26 NOTE — CV Procedure (Signed)
   Transesophageal Echocardiogram  Indications: atrial fibrillation  Time out performed  Propofol per anesthesia supervision  Findings:  Left Ventricle: EF 65%  Mitral Valve: Mild MR  Aortic Valve: Normal  Tricuspid Valve: Moderate TR  Left Atrium: Mildly dilated. No LAA thrombus   Right Atrium: Moderate dilated.  Candee Furbish, MD      Electrical Cardioversion Procedure Note [name] [id number] 11/25/1955  Procedure: Electrical Cardioversion Indications:  Atrial Fibrillation  Time Out: Verified patient identification, verified procedure,medications/allergies/relevent history reviewed, required imaging and test results available.  Performed  Procedure Details  The patient was NPO after midnight. Anesthesia was administered at the beside  by Dr.Finucane with propofol.  Cardioversion was performed with synchronized biphasic defibrillation via AP pads with 200 joules.  3 attempt(s) were performed with compression.  The patient converted to normal sinus rhythm. The patient tolerated the procedure well   IMPRESSION:  Successful cardioversion of atrial fibrillation after 3 shocks.    Candee Furbish 03/26/2021, 11:10 AM

## 2021-03-26 NOTE — Progress Notes (Signed)
[facility] ADULT ICU REPLACEMENT PROTOCOL   The patient does apply for the [facility] Adult ICU Electrolyte Replacment Protocol based on the criteria listed below:   1.Exclusion criteria: TCTS patients, ECMO patients and Hypothermia Protocol, and   Dialysis patients 2. Is GFR >/= 30 ml/min? Yes.    Patient's GFR today is >60 3. Is SCr </= 2? Yes.   Patient's SCr is 0.91 mg/dL 4. Did SCr increase >/= 0.5 in 24 hours? No. 5.Pt's weight >40kg  Yes.   6. Abnormal electrolyte(s): K+ 3.4, mag 1.8  7. Electrolytes replaced per protocol 8.  Call MD STAT for K+ </= 2.5, Phos </= 1, or Mag </= 1 Physician:  n/a  Darlys Gales 03/26/2021 5:38 AM

## 2021-03-26 NOTE — Transfer of Care (Signed)
Immediate Anesthesia Transfer of Care Note  Patient: [name]  Procedure(s) Performed: TRANSESOPHAGEAL ECHOCARDIOGRAM (TEE) CARDIOVERSION  Patient Location: PACU and Endoscopy Unit  Anesthesia Type:General  Level of Consciousness: drowsy, patient cooperative and responds to stimulation  Airway & Oxygen Therapy: Patient Spontanous Breathing and Patient connected to nasal cannula oxygen  Post-op Assessment: Report given to RN and Post -op Vital signs reviewed and stable  Post vital signs: Reviewed and stable  Last Vitals:  Vitals Value Taken Time  BP    Temp    Pulse    Resp    SpO2      Last Pain:  Vitals:   03/26/21 0940  TempSrc: Temporal  PainSc: 1       Patients Stated Pain Goal: 2 (63/33/54 5625)  Complications: No notable events documented.

## 2021-03-26 NOTE — Progress Notes (Signed)
NAME:  [name], MRN:  [id number], DOB:  Aug 26, 1956, LOS: 4 ADMISSION DATE:  03/22/2021, CONSULTATION DATE:  03/23/21 REFERRING MD:  [facility], Dr Starla Link, CHIEF COMPLAINT:  Respiratory distress, requiring BIPAP   History of Present Illness:  65 y/o F who presented to the ED 7/16 with a history of 4 days of loose stools, and poor p.o. intake with right-sided abdominal pain.  Given pain medication and sent to radiology for CT abdomen/pelvis but unable to tolerate it due to pain in her back and hips.  She was then noted to be hypoxic and placed on 5 L oxygen.  ABG showed 7.1 8/82/124. She also developed atrial fibrillation with RVR, placed on Cardizem for rate control, BiPAP and admitted to ICU Due to elevated D-dimer, CT angiogram chest was attempted but she was unable to lie flat, started on IV heparin. Reportedly there is a history of chronic diarrhea, placed on enteric precautions  PCCM consulted 7/17 for BiPAP management and persistent acute respiratory acidosis  Pertinent  Medical History  Anemia  CKD  Arthritis with Poor Mobility s/p R Knee Replacement   GERD  Hepatitis B Hepatitis C - treated with Harvoni HTN HLD Hypothyroidism  Obesity  AVN  Significant Hospital Events: Including procedures, antibiotic start and stop dates in addition to other pertinent events   7/16 Admit with abd pain, diarrhea. AFwRVR. Elevated D-dimer.  7/17 PCCM consulted for BiPAP mgmt, respiratory acidosis  7/20 To [facility] for cardioversion   Interim History / Subjective:  To [facility] for cardioversion  2L [place] O2 - did not wear bipap overnight  Glucose range 107-116 Afebrile  I/O 934ml UOP, -236 ml in last 24 hours Pt indicates she is willing to wear O2 or do a home sleep study but can not come in for a study due to immobility  Objective   Blood pressure (!) 136/114, pulse 92, temperature 98 F (36.7 C), temperature source Axillary, resp. rate 20, height 5\' 6"  (1.676 m), weight (!) 172.8 kg, SpO2 98 %.         Intake/Output Summary (Last 24 hours) at 03/26/2021 0913 Last data filed at 03/26/2021 0400 Gross per 24 hour  Intake 713.15 ml  Output 750 ml  Net -36.85 ml   Filed Weights   03/24/21 0435 03/25/21 0600 03/26/21 0500  Weight: (!) 171 kg (!) 172.7 kg (!) 172.8 kg    Examination: General:  adult female sitting up in bed in NAD, husband at bedside  HEENT: MM pink/moist, wearing glasses, anicteric  Neuro: AAOx4, speech clear, MAE CV: s1s2 RRR, SR 60's, no m/r/g PULM: non-labored on  O2, lungs bilaterally distant but clear  GI: soft, bsx4 active  Extremities: warm/dry, BLE brawny edema  Skin: no rashes or lesions  Resolved Hospital Problem list     Assessment & Plan:   Acute on chronic hypoxic and hypercarbic respiratory failure Suspected OHS/OSA Admit bicarb >30 on BMP, suggestive of possible OHS.  CTA chest limited but negative for large central PE.  -limit sedating medications  -encourage BiPAP use at night  -wean O2 for sats >90% -mobilize as able  -discussed importance of sleep study, concept of sleep apnea and long term benefits of positive pressure -assess overnight oximetry while inpatient   AKI -Trend BMP / urinary output -Replace electrolytes as indicated -Avoid nephrotoxic agents, ensure adequate renal perfusion  Atrial fibrillation with RVR -per Cardiology  -continue lopressor, anticoagulation  -for cardioversion 7/20    Left Adrenal Nodule -per primary MD  Best Practice (right click and "Reselect all SmartList Selections" daily)   Diet/type: Regular consistency (see orders) DVT prophylaxis: DOAC GI prophylaxis: N/A Lines: N/A Foley:  N/A Code Status:  full code Last date of multidisciplinary goals of care discussion - per primary service     Noe Gens, MSN, APRN, NP-C, AGACNP-BC [facility] 03/26/2021, 9:13 AM   Please see [email or web address] for pager details.   From 7A-7P if no response, please call  [phone] After hours, please call ELink [phone]

## 2021-03-26 NOTE — Anesthesia Preprocedure Evaluation (Signed)
Anesthesia Evaluation  Patient identified by MRN, date of birth, ID band Patient awake    Reviewed: Allergy & Precautions, H&P , NPO status , Patient's Chart, lab work & pertinent test results  History of Anesthesia Complications (+) PONV and history of anesthetic complications  Airway Mallampati: II   Neck ROM: full    Dental   Pulmonary shortness of breath, Current Smoker and Patient abstained from smoking.,    breath sounds clear to auscultation       Cardiovascular hypertension, + Peripheral Vascular Disease and +CHF  + dysrhythmias Atrial Fibrillation  Rhythm:irregular Rate:Normal     Neuro/Psych PSYCHIATRIC DISORDERS Anxiety Depression  Neuromuscular disease    GI/Hepatic GERD  ,(+) Hepatitis -, C  Endo/Other  Hypothyroidism   Renal/GU      Musculoskeletal  (+) Arthritis ,   Abdominal   Peds  Hematology   Anesthesia Other Findings   Reproductive/Obstetrics                             Anesthesia Physical Anesthesia Plan  ASA: 3  Anesthesia Plan: MAC   Post-op Pain Management:    Induction: Intravenous  PONV Risk Score and Plan: 2 and Propofol infusion and Treatment may vary due to age or medical condition  Airway Management Planned: Nasal Cannula  Additional Equipment:   Intra-op Plan:   Post-operative Plan:   Informed Consent: I have reviewed the patients History and Physical, chart, labs and discussed the procedure including the risks, benefits and alternatives for the proposed anesthesia with the patient or authorized representative who has indicated his/her understanding and acceptance.     Dental advisory given  Plan Discussed with: CRNA, Anesthesiologist and Surgeon  Anesthesia Plan Comments:         Anesthesia Quick Evaluation

## 2021-03-26 NOTE — Progress Notes (Signed)
  Echocardiogram Echocardiogram Transesophageal has been performed.  Michiel Cowboy 03/26/2021, 11:51 AM

## 2021-03-26 NOTE — Interval H&P Note (Signed)
History and Physical Interval Note:  03/26/2021 9:51 AM  [name]  has presented today for surgery, with the diagnosis of afib.  The various methods of treatment have been discussed with the patient and family. After consideration of risks, benefits and other options for treatment, the patient has consented to  Procedure(s): TRANSESOPHAGEAL ECHOCARDIOGRAM (TEE) (N/A) CARDIOVERSION (N/A) as a surgical intervention.  The patient's history has been reviewed, patient examined, no change in status, stable for surgery.  I have reviewed the patient's chart and labs.  Questions were answered to the patient's satisfaction.     [employer]

## 2021-03-26 NOTE — Progress Notes (Signed)
eLink Physician-Brief Progress Note Patient Name: FAVOR KREH DOB: 09/14/1955 MRN: [id number]   Date of Service  03/26/2021  HPI/Events of Note  K+ 3.4, Mg++ 1.8, creatinine 0.91.  eICU Interventions  E-Link nurse driven electrolyte replacement protocol ordered.        Sharren Schnurr U Khiem Gargis 03/26/2021, 5:30 AM

## 2021-03-26 NOTE — Anesthesia Postprocedure Evaluation (Signed)
Anesthesia Post Note  Patient: Katrinia R Cirelli  Procedure(s) Performed: TRANSESOPHAGEAL ECHOCARDIOGRAM (TEE) CARDIOVERSION     Patient location during evaluation: PACU Anesthesia Type: General Level of consciousness: awake and alert, oriented and patient cooperative Pain management: pain level controlled Vital Signs Assessment: post-procedure vital signs reviewed and stable Respiratory status: spontaneous breathing, nonlabored ventilation and respiratory function stable Cardiovascular status: blood pressure returned to baseline and stable Postop Assessment: no apparent nausea or vomiting Anesthetic complications: no   No notable events documented.  Last Vitals:  Vitals:   03/26/21 1140 03/26/21 1150  BP: 134/77 135/73  Pulse: (!) 52 91  Resp: (!) 26 20  Temp:    SpO2: 100% 95%    Last Pain:  Vitals:   03/26/21 1130  TempSrc:   PainSc: 0-No pain                 Pervis Hocking

## 2021-03-26 NOTE — Progress Notes (Signed)
Occupational Therapy Evaluation  Patient lives with spouse in a single level home with ramp entry. Reports ~3 years ago had a LE fracture and did not heal correctly now R LE is abducted and has painful R hip limiting her ability to walk. At baseline patient primarily pivot transfers from power chair, has assist from spouse for lower body self care including perianal care. Instructed patient in use of toilet tongs and provided to her to maximize independence with toileting tasks. Patient is heavily reliant on upper extremities and pulling on bed rail to stand from bedside commode and min G to pivot to recliner chair. Educate patient on how to try and reduce skin break down, PT/OT also discuss with RN ordering interdry for patient. Recommend continued acute OT services to facilitate D/C to venue listed below.     03/26/21 1100  OT Visit Information  Last OT Received On 03/26/21  Assistance Needed +2 (safety)  PT/OT/SLP Co-Evaluation/Treatment Yes  Reason for Co-Treatment For patient/therapist safety;To address functional/ADL transfers  PT goals addressed during session Mobility/safety with mobility  OT goals addressed during session ADL's and self-care  History of Present Illness 65 y.o. female presented 03/22/21 with diarrhea along with worsening shortness of breath and lower extremity edema.  , Pt found to be hypoxic and ABG showed hypercapnic respiratory failure along with elevated D-dimer.  CT angiogram attempted twice but unable to lie down flat.  She was placed on BiPAP; chest x-ray showed congestion.  She was also found to have A. fib with RVR. PMH of hypertension, hyperlipidemia, hypothyroidism  Precautions  Precautions Fall  Precaution Comments body habitus, monitor  HR/Sats, not on O2  Restrictions  Weight Bearing Restrictions No  Home Living  Family/patient expects to be discharged to: Private residence  Living Arrangements Spouse/significant other  Available Help at Discharge  Family;Available 24 hours/day  Type of [place] One level  Bathroom Shower/Tub Walk-in shower  [place] - 4 wheels;Walker - 2 wheels;Wheelchair - power;BSC;Grab bars - tub/shower;Hand held shower head;Shower seat  Additional Comments sleeps in lift chair  Prior Function  Level of Independence Needs assistance  Gait / Transfers Assistance Needed sleeps in lift chair, transfers to [facility] with spouse assisting, Power chair  to Br  to transfer to shower seat. stands at [employer] for hygiene performed by spouse  ADL's / Homemaking Assistance Needed spouse assists with perianal care, lower body ADLs  Communication  Communication No difficulties  Pain Assessment  Pain Assessment Faces  Faces Pain Scale 4  Pain Location left flank near panus/ skin breakdown  Pain Descriptors / Indicators Sore  Pain Intervention(s) Monitored during session  Cognition  Arousal/Alertness Awake/alert  Behavior During Therapy WFL for tasks assessed/performed  Overall Cognitive Status Within Functional Limits for tasks assessed  Upper Extremity Assessment  Upper Extremity Assessment Generalized weakness  Lower Extremity Assessment  Lower Extremity Assessment Defer to PT evaluation  Cervical / Trunk Assessment  Cervical / Trunk Assessment Other exceptions  Cervical / Trunk Exceptions body habitus  ADL  Overall ADL's  Needs assistance/impaired  Eating/Feeding Independent  Grooming Set up;Sitting  Upper Body Bathing Set up;Sitting  Lower Body Bathing Maximal assistance;Sitting/lateral leans;Sit to/from stand  Lower Body Bathing Details (indicate cue type and reason) uses hand held shower head at home and spouse assist for peri area  Upper Body Dressing  Set up;Sitting  Lower Body Dressing Maximal assistance;Sitting/lateral leans;Sit to/from stand  Toilet  Transfer Min guard;+2 for safety/equipment;Stand-pivot;BSC  Toilet  Transfer Details (indicate cue type and reason) patient does not come to full stand, stays with trunk flexed. holds onto furniture to pivot to bedside commode. has to pull up on bed rail in order to stand from commode  Toileting- Water quality scientist and Hygiene Total assistance;Sit to/from stand  Toileting - Clothing Manipulation Details (indicate cue type and reason) educate patient on benefit of toileting aid and provided for patient to try. gave brief visual demonstration. patient reports trying a bidet at home but did not fit on her toilet correctly  Functional mobility during ADLs Min guard;+2 for safety/equipment  General ADL Comments educate patient on skin breakdown prevention such as practicing standing at home multiple times a day. patient states she was trying to do that at her kitchen sink at home but has been having increased difficulty  Bed Mobility  Overal bed mobility Needs Assistance  Bed Mobility Supine to Sit  Supine to sit Min guard;HOB elevated  General bed mobility comments use of bed rail, close guard.  Transfers  Overall transfer level Needs assistance  Transfers Sit to/from Stand;Stand Pivot Transfers  Sit to Stand Min guard;+2 safety/equipment  Stand pivot transfers Min guard;+2 safety/equipment  General transfer comment limited standing tolerance or ability to take steps, primarily pivots from bedside commode to recliner and heavy reliant on pulling up from bed rail to stand. min G x2 for safety  Balance  Overall balance assessment Needs assistance  Sitting-balance support Feet supported  Sitting balance-Leahy Scale Fair  Standing balance support During functional activity;Bilateral upper extremity supported  Standing balance-Leahy Scale Poor  Standing balance comment reliant on support of UE's  OT - End of Session  Activity Tolerance Patient limited by fatigue  Patient left in chair;with call bell/phone within reach  Nurse Communication Mobility status  OT  Assessment  OT Recommendation/Assessment Patient needs continued OT Services  OT Visit Diagnosis Other abnormalities of gait and mobility (R26.89);Pain  Pain - Right/Left Left  Pain - part of body  (flank)  OT Problem List Decreased activity tolerance;Decreased knowledge of use of DME or AE;Impaired balance (sitting and/or standing);Decreased safety awareness;Obesity;Cardiopulmonary status limiting activity  OT Plan  OT Frequency (ACUTE ONLY) Min 2X/week  OT Treatment/Interventions (ACUTE ONLY) Self-care/ADL training;Therapeutic exercise;Energy conservation;DME and/or AE instruction;Patient/family education;Balance training;Therapeutic activities  AM-PAC OT "6 Clicks" Daily Activity Outcome Measure (Version 2)  Help from another person eating meals? 4  Help from another person taking care of personal grooming? 3  Help from another person toileting, which includes using toliet, bedpan, or urinal? 2  Help from another person bathing (including washing, rinsing, drying)? 2  Help from another person to put on and taking off regular upper body clothing? 3  Help from another person to put on and taking off regular lower body clothing? 2  6 Click Score 16  Progressive Mobility  What is the highest level of mobility based on the progressive mobility assessment? Level 3 (Stands with assist) - Balance while standing  and cannot march in place  Mobility Out of bed for toileting;Out of bed to chair with meals  OT Recommendation  Follow Up Recommendations Home health OT;Other (comment) (would benefit from [facility] aide)  OT Equipment Other (comment) (LH AE)  Individuals Consulted  Consulted and Agree with Results and Recommendations Patient  Acute Rehab OT Goals  Patient Stated Goal be able to go home get extra support  OT Goal Formulation With patient  Time For Goal Achievement 04/09/21  Potential to Achieve Goals Fair  OT Time Calculation  OT Start Time (ACUTE ONLY) 0800  OT Stop Time (ACUTE ONLY)  0823  OT Time Calculation (min) 23 min  OT General Charges  $OT Visit 1 Visit  OT Evaluation  $OT Eval Low Complexity 1 Low  Written Expression  Dominant Hand  (did not specify)   Delbert Phenix OT OT pager: [phone]

## 2021-03-26 NOTE — Anesthesia Procedure Notes (Signed)
Procedure Name: MAC Date/Time: 03/26/2021 10:45 AM Performed by: Janace Litten, CRNA Pre-anesthesia Checklist: Patient identified, Patient being monitored, Timeout performed, Emergency Drugs available and Suction available Patient Re-evaluated:Patient Re-evaluated prior to induction Oxygen Delivery Method: Nasal cannula

## 2021-03-26 NOTE — Progress Notes (Signed)
Physical Therapy Treatment Patient Details Name: [name] MRN: [id number] DOB: 1955-10-08 Today's Date: 03/26/2021    History of Present Illness Brief Narrative:   65 y.o. female with history of hypertension, hyperlipidemia, hypothyroidism presented 03/22/21 with diarrhea along with worsening shortness of breath and lower extremity edema.  On presentation, she was found to be hypoxic and ABG showed hypercapnic respiratory failure along with elevated D-dimer.  CT angiogram was attempted twice but she was unable to lie down flat.  She was placed on BiPAP; chest x-ray showed congestion.  She was also found to have A. fib with RVR    PT Comments    The patient mobilized self to sitting onto bed edge, using rail. Patient stood supported on [facility] to turn to transfer to [facility], stood from [facility] to transfer to recliner.  Patient requesting in home aide. Deferred to [facility] to discuss. Recommend HHPT.  Patient on RA, SPO2 >92 %.  Patient  could benefit from IS, noted congestion.  Patient also complains of  discomfort on Left skin folds, noted  redness. RN to assess, could benefit from SKIn care RN and Interdry.  Follow Up Recommendations  Home health PT (aide)     Equipment Recommendations  None recommended by PT    Recommendations for Other Services       Precautions / Restrictions Precautions Precaution Comments: body habitus, monitor  HR/Sats, not on O2 Restrictions Weight Bearing Restrictions: No    Mobility  Bed Mobility   Bed Mobility: Supine to Sit     Supine to sit: Min guard;HOB elevated     General bed mobility comments: use of bed rail, close guard.    Transfers Overall transfer level: Needs assistance   Transfers: Sit to/from Stand;Stand Pivot Transfers Sit to Stand: Min guard Stand pivot transfers: Min guard       General transfer comment: from bed, patient leans and reaches  for [facility] and turns 180* to [facility], cues for safety. Then reaches for bed rail in front of her to  stan for perivcare, then  turns to pivot  to recliner.  Ambulation/Gait                 Stairs             Wheelchair Mobility    Modified Rankin (Stroke Patients Only)       Balance   Sitting-balance support: Feet supported;Bilateral upper extremity supported Sitting balance-Leahy Scale: Fair     Standing balance support: During functional activity;Bilateral upper extremity supported Standing balance-Leahy Scale: Poor Standing balance comment: reliant on support of UE's                            Cognition Arousal/Alertness: Awake/alert Behavior During Therapy: WFL for tasks assessed/performed Overall Cognitive Status: Within Functional Limits for tasks assessed                                        Exercises      General Comments        Pertinent Vitals/Pain Pain Assessment: Faces Faces Pain Scale: Hurts little more Pain Location: general Pain Descriptors / Indicators: Discomfort Pain Intervention(s): Limited activity within patient's tolerance;Monitored during session    Home Living  Prior Function            PT Goals (current goals can now be found in the care plan section) Progress towards PT goals: Progressing toward goals    Frequency    Min 3X/week      PT Plan Current plan remains appropriate    Co-evaluation PT/OT/SLP Co-Evaluation/Treatment: Yes Reason for Co-Treatment: For patient/therapist safety PT goals addressed during session: Mobility/safety with mobility OT goals addressed during session: ADL's and self-care      AM-PAC PT "6 Clicks" Mobility   Outcome Measure  Help needed turning from your back to your side while in a flat bed without using bedrails?: A Little Help needed moving from lying on your back to sitting on the side of a flat bed without using bedrails?: A Little Help needed moving to and from a bed to a chair (including a wheelchair)?: A  Little Help needed standing up from a chair using your arms (e.g., wheelchair or bedside chair)?: A Little Help needed to walk in hospital room?: Total Help needed climbing 3-5 steps with a railing? : Total 6 Click Score: 14    End of Session   Activity Tolerance: Patient tolerated treatment well;Patient limited by fatigue Patient left: with call bell/phone within reach;in chair Nurse Communication: Mobility status PT Visit Diagnosis: Unsteadiness on feet (R26.81);Muscle weakness (generalized) (M62.81);Difficulty in walking, not elsewhere classified (R26.2)     Time: 0211-1735 PT Time Calculation (min) (ACUTE ONLY): 23 min  Charges:  $Therapeutic Activity: 8-22 mins                     Tresa Endo PT Acute Rehabilitation Services Pager [phone] Office [phone]    Claretha Cooper 03/26/2021, 9:54 AM

## 2021-03-26 NOTE — Progress Notes (Signed)
Progress Note  Patient Name: [name] Date of Encounter: 03/26/2021  [facility] Cardiologist: Skeet Latch, MD   Subjective   No acute overnight events. No chest pain. Shortness of breath OK. No palpitations.   She remains in atrial fibrillation this morning. Rates in the 80s to 90s at rest. Plan is for TEE/DCCV today. She is nervous about this. I discussed procedure with her and answered any remaining questions.   Inpatient Medications    Scheduled Meds:  apixaban  5 mg Oral BID   chlorhexidine  15 mL Mouth Rinse BID   Chlorhexidine Gluconate Cloth  6 each Topical Daily   mouth rinse  15 mL Mouth Rinse q12n4p   metoprolol tartrate  50 mg Oral BID   Continuous Infusions:  PRN Meds: acetaminophen **OR** acetaminophen, traMADol   Vital Signs    Vitals:   03/26/21 0300 03/26/21 0400 03/26/21 0500 03/26/21 0600  BP:  129/67  132/83  Pulse:  90  83  Resp:  (!) 25  12  Temp: 97.7 F (36.5 C)     TempSrc: Oral     SpO2:  93%  94%  Weight:   (!) 172.8 kg   Height:        Intake/Output Summary (Last 24 hours) at 03/26/2021 0716 Last data filed at 03/26/2021 0400 Gross per 24 hour  Intake 713.15 ml  Output 950 ml  Net -236.85 ml   Last 3 Weights 03/26/2021 03/25/2021 03/24/2021  Weight (lbs) 380 lb 15.3 oz 380 lb 11.8 oz 376 lb 15.8 oz  Weight (kg) 172.8 kg 172.7 kg 171 kg      Telemetry    Atrial fibrillation with rates in the 80s to 90s at rest with spikes to the 120s suspect this is with activity/movement in bed. - Personally Reviewed  ECG    No new ECG tracing today. - Personally Reviewed  Physical Exam   GEN: Morbidly obese Caucasian female. No acute distress.   Neck: Unable to assess JVD due to body habitus. Cardiac: Irregularly irregular rhythm with normal rate. No significant murmur, rubs, or gallops appreciated. Respiratory: No increased work of breathing. Clear to auscultation anteriorly.  GI: Soft, non-distended, and non-tender. MS:  Chronic mild edema of bilateral lower extremities. No deformity. Neuro:  No focal deficits. Psych: Normal affect. Responds appropriately.  Labs    High Sensitivity Troponin:   Recent Labs  Lab 03/22/21 0629 03/22/21 0829 03/22/21 1130  TROPONINIHS 9 10 9       Chemistry Recent Labs  Lab 03/22/21 0241 03/22/21 0620 03/22/21 1130 03/23/21 0454 03/24/21 0316 03/25/21 0250 03/26/21 0252  NA 140   < > 142   < > 139 139 139  K 3.4*   < > 3.9   < > 4.0 3.6 3.4*  CL 102  --  102   < > 102 103 103  CO2 29  --  32   < > 28 26 26   GLUCOSE 123*  --  137*   < > 86 83 116*  BUN 12  --  16   < > 27* 27* 22  CREATININE 0.75  --  1.12*   < > 1.32* 1.10* 0.91  CALCIUM 8.8*  --  8.6*   < > 8.4* 8.7* 8.8*  PROT 7.3  --  8.0  --  7.2  --   --   ALBUMIN 3.5  --  3.4*  --  3.1*  --   --   AST 14*  --  19  --  18  --   --   ALT 11  --  17  --  17  --   --   ALKPHOS 48  --  52  --  47  --   --   BILITOT 0.6  --  0.8  --  0.6  --   --   GFRNONAA >60  --  54*   < > 45* 55* >60  ANIONGAP 9  --  8   < > 9 10 10    < > = values in this interval not displayed.     Hematology Recent Labs  Lab 03/24/21 0316 03/25/21 0250 03/26/21 0252  WBC 7.6 7.2 6.7  RBC 4.44 4.26 4.47  HGB 12.6 11.9* 12.6  HCT 42.0 40.5 41.7  MCV 94.6 95.1 93.3  MCH 28.4 27.9 28.2  MCHC 30.0 29.4* 30.2  RDW 15.7* 15.5 15.6*  PLT 120* 118* 130*    BNP Recent Labs  Lab 03/22/21 0629  BNP 158.6*     DDimer  Recent Labs  Lab 03/22/21 0629  DDIMER >20.00*     Radiology    CT Angio Chest Pulmonary Embolism (PE) W or WO Contrast  Result Date: 03/25/2021 CLINICAL DATA:  Shortness of breath.  Suspected pulmonary embolism. EXAM: CT ANGIOGRAPHY CHEST WITH CONTRAST TECHNIQUE: Multidetector CT imaging of the chest was performed using the standard protocol during bolus administration of intravenous contrast. Multiplanar CT image reconstructions and MIPs were obtained to evaluate the vascular anatomy. CONTRAST:  160mL  OMNIPAQUE IOHEXOL 350 MG/ML SOLN COMPARISON:  03/09/2009 FINDINGS: Cardiovascular: Study has technical limitations due to motion artifact and poor opacification of the pulmonary arteries beyond the main and lobar pulmonary arteries. In particular, there is limited evaluation of the upper pulmonary arteries. No evidence for a large or central pulmonary embolism. Coronary artery calcifications. Normal caliber of the thoracic aorta. Atherosclerotic calcifications involving the thoracic aorta. Heart size is within normal limits. No significant pericardial effusion. Mediastinum/Nodes: Mild fullness in the subcarinal soft tissue with calcifications. Findings could be associated with old granulomatous disease. No axillary lymph node enlargement. Lungs/Pleura: Significant motion artifact limits evaluation of the lungs. Peripheral densities in left lower lobe are nonspecific but could represent atelectasis. No large areas of airspace disease or lung consolidation. No significant pleural fluid. Upper Abdomen: Cholecystectomy. 2.3 cm partially visualized left adrenal nodule is new or enlarged since 2010. Musculoskeletal: No acute bone abnormality. Review of the MIP images confirms the above findings. IMPRESSION: 1. Technically challenging examination and limited evaluation for pulmonary embolism. No evidence for a large or central pulmonary embolism. 2. Limited evaluation of both lungs due to significant motion artifact. Patchy lung densities in the lungs are suggestive for areas of atelectasis but nonspecific. No large areas of airspace disease or lung consolidation. 3. Evidence of old granulomatous disease. 4. **An incidental finding of potential clinical significance has been found. Indeterminate 2.3 cm left adrenal nodule. This is new or enlarged since 2010. Recommend further characterization with a dedicated adrenal CT. ** 5.  Aortic Atherosclerosis (ICD10-I70.0). Electronically Signed   By: Markus Daft M.D.   On:  03/25/2021 12:46   NM Pulmonary Perf and Vent  Result Date: 03/24/2021 Lenore Manner     03/24/2021  4:52 PM VQ SCAN WILL BE ATTEMPTED TUES AM IF RENAL LABS ARE STILL ELEVATED PER DR Starla Link. PT UNABLE TO LIE FLAT  FOR CT - WILL RE EVALUATE THIS TOMORROW. OK TO HOLD EXAM FOR NOW PER DR  Ander Slade / BLS [facility] [phone]    Cardiac Studies   Echocardiogram 03/22/2021: Impressions: 1. Left ventricular ejection fraction, by estimation, is 60 to 65%. The  left ventricle has normal function. The left ventricle has no regional  wall motion abnormalities. Left ventricular diastolic function could not  be evaluated.   2. The right ventricle is poorly visualized, but appears to be at least  mildly dilated and at least mildly depressed. Right ventricular systolic  function is mildly reduced. The right ventricular size is mildly enlarged.  Tricuspid regurgitation signal is   inadequate for assessing PA pressure.   3. The mitral valve is normal in structure. No evidence of mitral valve  regurgitation. No evidence of mitral stenosis.   4. The aortic valve is normal in structure. Aortic valve regurgitation is  not visualized. No aortic stenosis is present.   5. The inferior vena cava is dilated in size with >50% respiratory  variability, suggesting right atrial pressure of 8 mmHg.  Patient Profile     66 y.o. female with a history of hypertension, hypothyroidism, left leg superficial thrombophlebitis, CKD stage III, GERD, Hepatitis B/C, avascular necrosis of the hips, and morbid obesity with BMI of 60 who is being for the evaluation of atrial fibrillation at the request of Dr. Starla Link.  Assessment & Plan    New Onset Atrial Fibrillation with RVR - Patient presented with RUQ pain and nausea/vomiting/diarrhea. Found to be in new onset atrial fibrillation with RVR. - Rates currently the 80s to 90s at rest with brief spikes into the 120s (suspect this is with activity/movement). - Potassium slightly low  at 3.4 today. Supplemented by primary team. Goal >4.0.  - Magnesium 1.8. - TSH normal. - Echo showed LVEF of 60-65% with normal wall motion. RV poorly visualized but appears at least mildly dilated with at least mildly reduced systolic function. - Initially started on IV Cardizem but stopped due to hypotension. - Continue Lopressor 50mg  twice daily.  - Will Amiodarone 200mg  twice daily for 2 weeks and then transition to 200mg  daily. Will need repeat TSH and LFTs in 1 month given her history of hepatitis and hypothyroidism. - CHA2DS2-VASc = 3 (HTN, age x16, female). Started on Eliquis 5mg  twice daily. Continue. - Plan is for TEE/DCCV today. - Outpatient sleep study recommended but patient not think she can do it because of her inability to transfer.  Shared Decision Making/Informed Consent{ The risks [stroke, cardiac arrhythmias rarely resulting in the need for a temporary or permanent pacemaker, skin irritation or burns, esophageal damage, perforation (1:10,000 risk), bleeding, pharyngeal hematoma as well as other potential complications associated with conscious sedation including aspiration, arrhythmia, respiratory failure and death], benefits (treatment guidance, restoration of normal sinus rhythm, diagnostic support) and alternatives of a transesophageal echocardiogram guided cardioversion were discussed in detail with Ms. Wargo and she is willing to proceed.   Hypertension - BP mostly well controlled. Stabilized off IV Cardizem. - Continue Lopressor 50mg  twice daily. - Continue to hold home Lisinopril for now to allow for more rate control if needed. - Continue to monitor.   Acute Combined Respiratory Failure - Initially required BiPAP. - Initial ABG showed respiratory acidosis with pH of 7.192, pCO2 of 81.4, pO2 of 60, and BiCarb of 31. Improving. Most recent ABG on 7/18 showed pH 7.34, pCO2 49.1, pO2 66.9, Bicarb 26. - D-dimer >20. Chest CTA technically challenging and limited  evaluation for PE but no evidence for a large central PE. - Management per primary team.  AKI - Resolved  - Creatinine 0.75 on admission. Peaked at 1.71 on 7/17. Improved to 0.91 today. - Suspect prerenal due to hypotension/dehydration. - Continue to monitor.   Morbid Obesity - BMI 60.85. - Weight loss very important but likely will be difficult given patient is wheelchair/bedbound.   Otherwise, per primary team: - RUQ pain with nausea/vomiting/diarrhea - Hypothyroidism - History of hepatitis B/C - Thrombocytopenia  For questions or updates, please contact [facility] Please consult [email or web address] for contact info under        Signed, Darreld Mclean, PA-C  03/26/2021, 7:16 AM

## 2021-03-26 NOTE — TOC Initial Note (Signed)
Transition of Care [facility]) - Initial/Assessment Note    Patient Details  Name: [name] MRN: [id number] Date of Birth: [birth year]  Transition of Care [facility]) CM/SW Contact:    Leeroy Cha, RN Phone Number: 03/26/2021, 8:25 AM  Clinical Narrative:                 No acute overnight events. No chest pain. Shortness of breath OK. No palpitations.   She remains in atrial fibrillation this morning. Rates in the 80s to 90s at rest. Plan is for TEE/DCCV today TOC PLAN OF CARE: following for toc needs and progression. Expected Discharge Plan: Home/Self Care Barriers to Discharge: Continued Medical Work up   Patient Goals and CMS Choice Patient states their goals for this hospitalization and ongoing recovery are:: to go home CMS Medicare.gov Compare Post Acute Care list provided to:: Patient Choice offered to / list presented to : Patient  Expected Discharge Plan and Services Expected Discharge Plan: Home/Self Care   Discharge Planning Services: CM Consult   Living arrangements for the past 2 months: Single Family Home                                      Prior Living Arrangements/Services Living arrangements for the past 2 months: Single Family Home Lives with:: Self Patient language and need for interpreter reviewed:: Yes Do you feel safe going back to the place where you live?: Yes            Criminal Activity/Legal Involvement Pertinent to Current Situation/Hospitalization: No - Comment as needed  Activities of Daily Living Home Assistive Devices/Equipment: CBG Meter, Wheelchair ADL Screening (condition at time of admission) Patient's cognitive ability adequate to safely complete daily activities?: Yes Is the patient deaf or have difficulty hearing?: No Does the patient have difficulty seeing, even when wearing glasses/contacts?: No Does the patient have difficulty concentrating, remembering, or making decisions?: No Patient able to express need for  assistance with ADLs?: Yes Does the patient have difficulty dressing or bathing?: Yes Independently performs ADLs?: No Communication: Independent Dressing (OT): Needs assistance Is this a change from baseline?: Pre-admission baseline Grooming: Needs assistance Is this a change from baseline?: Pre-admission baseline Feeding: Independent Bathing: Needs assistance Is this a change from baseline?: Pre-admission baseline Toileting: Needs assistance Is this a change from baseline?: Pre-admission baseline In/Out Bed: Needs assistance Is this a change from baseline?: Pre-admission baseline Walks in Home: Needs assistance Is this a change from baseline?: Pre-admission baseline Does the patient have difficulty walking or climbing stairs?: Yes Weakness of Legs: Both Weakness of Arms/Hands: None  Permission Sought/Granted                  Emotional Assessment Appearance:: Appears stated age     Orientation: : Oriented to Self, Oriented to Place, Oriented to  Time, Oriented to Situation Alcohol / Substance Use: Not Applicable Psych Involvement: No (comment)  Admission diagnosis:  Acute gastroenteritis [K52.9] Acute respiratory acidosis [E87.2] Respiratory acidosis [E87.2] New onset atrial fibrillation ([place]) [I48.91] Atrial fibrillation with rapid ventricular response (HCC) [I48.91] Acute hypercapnic respiratory failure (HCC) [J96.02] Acute respiratory failure with hypoxia and hypercapnia ([place]) [J96.01, J96.02] Patient Active Problem List   Diagnosis Date Noted   Atrial fibrillation with rapid ventricular response ([place]) 03/22/2021   Acute hypercapnic respiratory failure ([place]) 03/22/2021   Diarrhea 03/22/2021   Ankle fracture, bimalleolar, closed, right, initial encounter 05/11/2018  Depression with anxiety 03/24/2018   Osteopenia 02/15/2017   Dyslipidemia with high LDL and low HDL 02/01/2017   Morbid obesity ([place]) 02/01/2017   Seborrheic keratoses 01/25/2017   Filiform wart  01/25/2017   Left carpal tunnel syndrome 03/23/2016   Benign neoplasm of transverse colon    Benign neoplasm of descending colon    Benign neoplasm of ascending colon    Gastroesophageal reflux disease with esophagitis    Depression 12/24/2015   Numbness and tingling in left hand 12/24/2015   History of hepatitis C 08/26/2015   Elevated hemoglobin A1c 08/26/2015   Chronic diarrhea 05/28/2015   Benign neoplasm of colon 02/26/2015   Varicose veins of both lower extremities with complications 41/74/0814   Chronic venous insufficiency 01/11/2015   Hepatic cirrhosis ([place]) 08/07/2014   Change in bowel habits 07/19/2014   Essential hypertension, benign 03/19/2014   Smoking 03/19/2014   Numerous moles 03/19/2014   Leg length discrepancy 04/14/2013   HTN (hypertension) 03/28/2013   Vitamin D deficiency 02/02/2012   Degenerative joint disease 02/02/2012   Osteoporosis 02/02/2012   Hepatitis C 02/02/2012   CLAUDICATION 04/29/2009   Hypothyroidism 04/25/2009   Thrombocytopenia ([place]) 04/25/2009   PCP:  Sandi Mariscal, MD Pharmacy:   [facility] [place], [place] - [address] [place] [address] Phone: [phone] Fax: [phone]     Social Determinants of Health (SDOH) Interventions    Readmission Risk Interventions No flowsheet data found.

## 2021-03-26 NOTE — Progress Notes (Addendum)
PROGRESS NOTE    [name]  TJQ:[id number] DOB: May 17, 1956 DOA: 03/22/2021 PCP: Sandi Mariscal, MD    Brief Narrative:  [name] was admitted to the hospital with the working diagnosis of acute hypoxemic respiratory failyure due to heart failure decompensation in the setting of atrial fibrillation with RVR.   65 yo female with the past medical history of hypertension, dyslipidemia and hypothyroidism who presented diarrhea for about 3 to 4 days.  Symptoms associated with right lower quadrant abdominal discomfort and exertional dyspnea.  On her initial physical examination blood pressure 129/78, 92/57, heart rate 115, respiratory rate 27, temperature 98.4, she was placed on noninvasive mechanical ventilation for respiratory distress, with oxygen saturation up to 100%.  Her lungs had no wheezing or rales, heart S1-S2, present, rhythmic, soft abdomen, positive bilateral extremity edema.  Arterial pH 7.19 PCO2 81.4, PO2 60 bicarb 34, sodium 140, potassium three-point, chloride 102, bicarb 29, was 123, BUN 12, creatinine 0.75.  White count 11.3, hemoglobin 13.7, hematocrit 44.4, platelets 171.  D-dimer > 20.0 SARS COVID-19 negative.  Urinalysis specific gravity 1.017, 11-20 white cells, negative nitrates.  Chest radiograph with cardiomegaly, enlarged pulmonary artery on the right, bilateral reticular interstitial infiltrates predominantly at bases.  Cephalization of the vasculature.  EKG 132 bpm, normal axis, normal QTC, atrial fibrillation rhythm with 3 consecutive PVCs (nonsustained VT), no significant ST segment or T wave changes.  Patient was admitted to the stepdown unit, she received diuresis, placed on intravenous diltiazem and anticoagulation with IV heparin. Patient unable to perform CT angiography due to orthopnea.  Patient was successfully weaned off supplemental oxygen with good toleration. Echocardiography showed preserved LV systolic function. Ultrasonography lower extremities  negative for deep vein thrombosis.  07/19 CT chest negative for pulmonary embolism, bilateral groundglass opacities and interlobular thickening (personal interpretation)  Assessment & Plan:   Principal Problem:   Acute hypercapnic respiratory failure (HCC) Active Problems:   Hypothyroidism   HTN (hypertension)   Depression   Atrial fibrillation with rapid ventricular response (HCC)   Diarrhea   Acute on chronic diastolic CHF (congestive heart failure) (HCC)   Acute hypoxemic respiratory failure due to diastolic heart failure exacerbation.  Urine output over last 24 documented 950 ml.  Systolic blood pressure 762 to 139 mmHg.  HR 80 to 90's Oxymetry is 94% on 2 L/min per [place]  Echocardiogram with LV EF 60 to 26%, RV systolic with mild dilatation and reduction in systolic function, unable to estimate pulmonary pressures.   Add one time furosemide 40 mg IV to target more negative fluid balance.  Continue blood pressure monitoring and rate control atrial fibrillation.   2. Atrial fibrillation with rapid ventricular response.  Rate controlled with amiodarone and metoprolol, anticoagulation with apixaban.  Plan for TEE cardioversion today.   3. AKI, hypokalemia and hypomagnesemia Renal function with serum cr at 0,91, K is 3,4 and serum bicarbonate is 26. Mg is 1,8  Continue K and Mg correction, follow up renal function in am.   4. Hypothyroidism resume levothyroxine per home dosing.   5. HTN/ dyslipidemia continue blood pressure control with metoprolol.  Check lipid profile, and will add statin therapy for cardiovascular risk reduction.   6. Obesity class 3. Calculated BMI 61.4 Will need outpatient follow up, including sleep study.   Patient continue to be at high risk for worsening respiratory failure. [place] for telemetry ward   Status is: Inpatient  Remains inpatient appropriate because:Inpatient level of care appropriate due to severity of illness  Dispo: The patient is from:  Home              Anticipated d/c is to: Home              Patient currently is not medically stable to d/c.   Difficult to place patient No   DVT prophylaxis: Apixaban   Code Status:   full  Family Communication:  No family at the bedside     Consultants:  Cardiology  Pulmonary    Subjective: Patient is feeling better but not yet back to her baseline, continue to have dyspnea and lower extremity edema, no further diarrhea, no nausea or vomiting.   Objective: Vitals:   03/26/21 0300 03/26/21 0400 03/26/21 0500 03/26/21 0600  BP:  129/67  132/83  Pulse:  90  83  Resp:  (!) 25  12  Temp: 97.7 F (36.5 C)     TempSrc: Oral     SpO2:  93%  94%  Weight:   (!) 172.8 kg   Height:        Intake/Output Summary (Last 24 hours) at 03/26/2021 9518 Last data filed at 03/26/2021 0400 Gross per 24 hour  Intake 713.15 ml  Output 750 ml  Net -36.85 ml   Filed Weights   03/24/21 0435 03/25/21 0600 03/26/21 0500  Weight: (!) 171 kg (!) 172.7 kg (!) 172.8 kg    Examination:   General: Not in pain or dyspnea, deconditioned  Neurology: Awake and alert, non focal  E ENT: no pallor, no icterus, oral mucosa moist Cardiovascular: No JVD. S1-S2 present, rhythmic, no gallops, rubs, or murmurs. +++ [place]ak lower extremity edema, with positive pitting + Pulmonary: positive breath sounds bilaterally, with no wheezing, rhonchi, positive bilateral rales at bases. Gastrointestinal. Abdomen protuberant, non tender Skin. No rashes Musculoskeletal: no joint deformities     Data Reviewed: I have personally reviewed following labs and imaging studies  CBC: Recent Labs  Lab 03/22/21 0241 03/22/21 0620 03/22/21 1130 03/23/21 0454 03/24/21 0316 03/25/21 0250 03/26/21 0252  WBC 11.3*  --  12.1* 11.9* 7.6 7.2 6.7  NEUTROABS 8.4*  --  9.8*  --   --   --   --   HGB 13.7   < > 14.0 12.8 12.6 11.9* 12.6  HCT 44.4   < > 47.3* 42.3 42.0 40.5 41.7  MCV 89.7  --  95.4 94.0 94.6 95.1 93.3  PLT  171  --  203 117* 120* 118* 130*   < > = values in this interval not displayed.   Basic Metabolic Panel: Recent Labs  Lab 03/22/21 1130 03/23/21 0454 03/24/21 0316 03/25/21 0250 03/26/21 0252  NA 142 139 139 139 139  K 3.9 4.1 4.0 3.6 3.4*  CL 102 101 102 103 103  CO2 32 29 28 26 26   GLUCOSE 137* 111* 86 83 116*  BUN 16 21 27* 27* 22  CREATININE 1.12* 1.71* 1.32* 1.10* 0.91  CALCIUM 8.6* 8.1* 8.4* 8.7* 8.8*  MG  --   --  2.0 1.9 1.8   GFR: Estimated Creatinine Clearance: 100.5 mL/min (by C-G formula based on SCr of 0.91 mg/dL). Liver Function Tests: Recent Labs  Lab 03/22/21 0241 03/22/21 1130 03/24/21 0316  AST 14* 19 18  ALT 11 17 17   ALKPHOS 48 52 47  BILITOT 0.6 0.8 0.6  PROT 7.3 8.0 7.2  ALBUMIN 3.5 3.4* 3.1*   Recent Labs  Lab 03/22/21 0241  LIPASE <10*   No results for input(s): AMMONIA  in the last 168 hours. Coagulation Profile: Recent Labs  Lab 03/22/21 1418  INR 1.1   Cardiac Enzymes: No results for input(s): CKTOTAL, CKMB, CKMBINDEX, TROPONINI in the last 168 hours. BNP (last 3 results) No results for input(s): PROBNP in the last 8760 hours. HbA1C: No results for input(s): HGBA1C in the last 72 hours. CBG: Recent Labs  Lab 03/25/21 0601 03/25/21 1227 03/25/21 1707 03/25/21 2356 03/26/21 0620  GLUCAP 90 114* 108* 133* 107*   Lipid Profile: No results for input(s): CHOL, HDL, LDLCALC, TRIG, CHOLHDL, LDLDIRECT in the last 72 hours. Thyroid Function Tests: Recent Labs    03/24/21 1539  TSH 3.494   Anemia Panel: No results for input(s): VITAMINB12, FOLATE, FERRITIN, TIBC, IRON, RETICCTPCT in the last 72 hours.    Radiology Studies: I have reviewed all of the imaging during this hospital visit personally     Scheduled Meds:  amiodarone  200 mg Oral BID   Followed by   Derrill Memo ON 04/09/2021] amiodarone  200 mg Oral Daily   apixaban  5 mg Oral BID   chlorhexidine  15 mL Mouth Rinse BID   Chlorhexidine Gluconate Cloth  6 each  Topical Daily   mouth rinse  15 mL Mouth Rinse q12n4p   metoprolol tartrate  50 mg Oral BID   Continuous Infusions:   LOS: 4 days        Zerina Hallinan Gerome Apley, MD

## 2021-03-27 ENCOUNTER — Inpatient Hospital Stay (HOSPITAL_COMMUNITY): Payer: Medicare HMO

## 2021-03-27 DIAGNOSIS — J9602 Acute respiratory failure with hypercapnia: Secondary | ICD-10-CM | POA: Diagnosis not present

## 2021-03-27 DIAGNOSIS — N179 Acute kidney failure, unspecified: Secondary | ICD-10-CM | POA: Diagnosis not present

## 2021-03-27 DIAGNOSIS — F331 Major depressive disorder, recurrent, moderate: Secondary | ICD-10-CM

## 2021-03-27 DIAGNOSIS — I4891 Unspecified atrial fibrillation: Secondary | ICD-10-CM | POA: Diagnosis not present

## 2021-03-27 DIAGNOSIS — J9601 Acute respiratory failure with hypoxia: Secondary | ICD-10-CM | POA: Diagnosis not present

## 2021-03-27 LAB — CULTURE, BLOOD (ROUTINE X 2)
Culture: NO GROWTH
Culture: NO GROWTH

## 2021-03-27 LAB — BASIC METABOLIC PANEL
Anion gap: 7 (ref 5–15)
BUN: 21 mg/dL (ref 8–23)
CO2: 28 mmol/L (ref 22–32)
Calcium: 8.8 mg/dL — ABNORMAL LOW (ref 8.9–10.3)
Chloride: 103 mmol/L (ref 98–111)
Creatinine, Ser: 0.97 mg/dL (ref 0.44–1.00)
GFR, Estimated: >60 mL/min (ref >60)
Glucose, Bld: 119 mg/dL — ABNORMAL HIGH (ref 70–99)
Potassium: 3.8 mmol/L (ref 3.5–5.1)
Sodium: 138 mmol/L (ref 135–145)

## 2021-03-27 LAB — LIPID PANEL
Cholesterol: 126 mg/dL (ref 0–200)
HDL: 27 mg/dL — ABNORMAL LOW (ref >40)
LDL Cholesterol: 78 mg/dL (ref 0–99)
Total CHOL/HDL Ratio: 4.7 RATIO
Triglycerides: 103 mg/dL (ref <150)
VLDL: 21 mg/dL (ref 0–40)

## 2021-03-27 LAB — GLUCOSE, CAPILLARY: Glucose-Capillary: 138 mg/dL — ABNORMAL HIGH (ref 70–99)

## 2021-03-27 LAB — MAGNESIUM: Magnesium: 2.1 mg/dL (ref 1.7–2.4)

## 2021-03-27 MED ORDER — POTASSIUM CHLORIDE CRYS ER 20 MEQ PO TBCR
20.0000 meq | EXTENDED_RELEASE_TABLET | Freq: Every day | ORAL | Status: DC
  Administered 2021-03-27: 20 meq via ORAL
  Filled 2021-03-27: qty 1

## 2021-03-27 MED ORDER — METOPROLOL TARTRATE 25 MG PO TABS: 25.0000 mg | ORAL_TABLET | Freq: Two times a day (BID) | ORAL | 0 refills | Status: DC

## 2021-03-27 MED ORDER — AMIODARONE HCL 200 MG PO TABS: ORAL_TABLET | ORAL | 0 refills | Status: DC

## 2021-03-27 MED ORDER — FUROSEMIDE 40 MG PO TABS: 40.0000 mg | ORAL_TABLET | Freq: Every day | ORAL | 0 refills | Status: DC

## 2021-03-27 MED ORDER — FUROSEMIDE 40 MG PO TABS
40.0000 mg | ORAL_TABLET | Freq: Every day | ORAL | Status: DC
  Administered 2021-03-27: 40 mg via ORAL
  Filled 2021-03-27: qty 1

## 2021-03-27 MED ORDER — ACETAMINOPHEN 325 MG PO TABS: 650.0000 mg | ORAL_TABLET | Freq: Four times a day (QID) | ORAL | Status: DC | PRN

## 2021-03-27 MED ORDER — APIXABAN 5 MG PO TABS: 5.0000 mg | ORAL_TABLET | Freq: Two times a day (BID) | ORAL | 0 refills | Status: DC

## 2021-03-27 MED ORDER — ATORVASTATIN CALCIUM 40 MG PO TABS: 40.0000 mg | ORAL_TABLET | Freq: Every day | ORAL | 0 refills | Status: DC

## 2021-03-27 MED ORDER — POTASSIUM CHLORIDE CRYS ER 20 MEQ PO TBCR: 20.0000 meq | EXTENDED_RELEASE_TABLET | Freq: Every day | ORAL | 0 refills | Status: DC

## 2021-03-27 MED ORDER — METOPROLOL TARTRATE 25 MG PO TABS: 25.0000 mg | ORAL_TABLET | Freq: Two times a day (BID) | ORAL | Status: DC

## 2021-03-27 MED ORDER — IOHEXOL 9 MG/ML PO SOLN
500.0000 mL | ORAL | Status: AC
Start: 2021-03-27 — End: 2021-03-27
  Administered 2021-03-27: 500 mL via ORAL

## 2021-03-27 NOTE — Progress Notes (Signed)
Pt discharged, PIV removed. AVS went over with pt. PTAR came to pick up patient.

## 2021-03-27 NOTE — Discharge Summary (Addendum)
Physician Discharge Summary  [name] GBT:[id number] DOB: 1955-11-15 DOA: 03/22/2021  PCP: Wendy Mariscal, MD  Admit date: 03/22/2021 Discharge date: 03/27/2021  Admitted From: Home  Disposition:   Home   Recommendations for Outpatient Follow-up and new medication changes:  Follow up with Dr. Nancy Hobbs in 7 to 10 days. Follow up with cardiology as scheduled Patient placed on amiodarone and metoprolol for atrial fibrillation Anticoagulation with apixaban.  Home 02  Furosemide for diuresis.   An incidental finding of potential clinical significance has been found. Indeterminate 2.3 cm left adrenal nodule. This is new or enlarged since 2010. Recommend further characterization with a dedicated adrenal CT.   Adrenal CT performed before discharge resulted in 19 mm left adrenal nodule favoring a benign adrenal adenoma.   Patient will need outpatient sleep study.   I spoke over the phone with the patient's husband about patient's  condition, plan of care, prognosis and all questions were addressed.   Home Health: yes   Equipment/Devices: home 02    Discharge Condition: stable  CODE STATUS: full  Diet recommendation:  heart healthy   Brief/Interim Summary: [name] was admitted to the hospital with the working diagnosis of acute hypoxemic respiratory failure due to heart failure decompensation in the setting of atrial fibrillation with RVR.   65 yo female with the past medical history of hypertension, dyslipidemia and hypothyroidism who presented diarrhea for about 3 to 4 days.  Symptoms associated with right lower quadrant abdominal discomfort and exertional dyspnea.  On her initial physical examination blood pressure 129/78, 92/57, heart rate 115, respiratory rate 27, temperature 98.4, she was placed on noninvasive mechanical ventilation for respiratory distress, with oxygen saturation up to 100%.  Her lungs had no wheezing or rales, heart S1-S2, present, rhythmic, soft abdomen, positive  bilateral extremity edema.   Arterial pH 7.19 PCO2 81.4, PO2 60 bicarb 34, sodium 140, potassium 3.4, chloride 102, bicarb 29, was 123, BUN 12, creatinine 0.75.  White count 11.3, hemoglobin 13.7, hematocrit 44.4, platelets 171.  D-dimer > 20.0 SARS COVID-19 negative.   Urinalysis specific gravity 1.017, 11-20 white cells, negative nitrates.   Chest radiograph with cardiomegaly, enlarged pulmonary artery on the right, bilateral reticular interstitial infiltrates predominantly at bases.  Cephalization of the vasculature.   EKG 132 bpm, normal axis, normal QTC, atrial fibrillation rhythm with 3 consecutive PVCs (nonsustained VT), no significant ST segment or T wave changes.   Patient was admitted to the stepdown unit, she received diuresis, placed on intravenous diltiazem and anticoagulation with IV heparin. Patient unable to perform CT angiography due to orthopnea.   Patient was successfully weaned off supplemental oxygen with good toleration. Echocardiography showed preserved LV systolic function. Ultrasonography lower extremities negative for deep vein thrombosis.   07/19 CT chest negative for pulmonary embolism, bilateral groundglass opacities and interlobular thickening (personal interpretation)   07/20 TEE with cardioversion (3 attempts 200 J). Converted to sinus rhythm.   Symptoms with significant improvement, plan to follow up as outpatient.   Acute hypoxemic respiratory failure due to diastolic heart failure exacerbation.  Patient was admitted to the stepdown unit, received initial noninvasive mechanical ventilation and then successfully transitioned to oxygen per nasal cannula. Her volume status improved.  Further work-up with echocardiography showed left ventricular ejection fraction 60 to 65%, RV with mild dilatation and reduction in systolic function.  Unable to estimate pulmonary pressures. Considering patient's risk factors including obesity and likely obstructive sleep  apnea her pretest probability for pulmonary hypertension is high.  Add furosemide to her medical regiment to keep negative fluid balance.  2.  Atrial fibrillation with rapid ventricular response.  Patient was placed on amiodarone and metoprolol with good toleration. Underwent transesophageal echocardiography and DC cardioversion (3 attempts), converting to normal sinus rhythm.  Plan to continue amiodarone and metoprolol. Follow-up with cardiology as an outpatient.  3.  Acute kidney injury, hypokalemia, hypomagnesemia.  With supportive medical care.  Her kidney function stabilized.  Electrolytes were corrected. At discharge sodium 138, potassium 3.8, chloride 103, bicarb 28, glucose 119, BUN 21, creatinine 0.97, magnesium 2.1.  4.  Hypothyroidism.  Continue levothyroxine.  5.  Hypertension/dyslipidemia.  Continue blood pressure control metoprolol, hold lisinopril. Continue statin therapy.  6.  Obesity class III/ depression OSA/ obesity hypoventilation syndrome.  Calculated BMI 61.4.  Will need close follow-up as an outpatient. Continue with escitalopram.   Discharge Diagnoses:  Principal Problem:   Acute hypercapnic respiratory failure ([place]) Active Problems:   Hypothyroidism   HTN (hypertension)   Depression   Atrial fibrillation with rapid ventricular response ([facility])   Diarrhea   Acute on chronic diastolic CHF (congestive heart failure) ([place])    Discharge Instructions   Allergies as of 03/27/2021       Reactions   Penicillins Anaphylaxis, Other (See Comments)   Has patient had a PCN reaction causing immediate rash, facial/tongue/throat swelling, SOB or lightheadedness with hypotension: Yes Has patient had a PCN reaction causing severe rash involving mucus membranes or skin necrosis: No Has patient had a PCN reaction that required hospitalization: Yes Has patient had a PCN reaction occurring within the last 10 years: No If all of the above answers are "NO", then may proceed  with Cephalosporin use.   Demerol Nausea And Vomiting   Meperidine Nausea And Vomiting   Morphine And Related Nausea And Vomiting        Medication List     STOP taking these medications    lisinopril 40 MG tablet Commonly known as: ZESTRIL       TAKE these medications    acetaminophen 325 MG tablet Commonly known as: TYLENOL Take 2 tablets (650 mg total) by mouth every 6 (six) hours as needed for mild pain (or Fever >/= 101).   amiodarone 200 MG tablet Commonly known as: PACERONE Take one tablet twice daily until 04/08/21, then change to one tablet daily starting on 04/09/21.   apixaban 5 MG Tabs tablet Commonly known as: ELIQUIS Take 1 tablet (5 mg total) by mouth 2 (two) times daily.   atorvastatin 40 MG tablet Commonly known as: LIPITOR Take 1 tablet (40 mg total) by mouth daily. Start taking on: March 28, 2021   escitalopram 10 MG tablet Commonly known as: LEXAPRO TAKE 1 TABLET BY MOUTH EVERY DAY. Need TO keep appointment in June FOR future refills   furosemide 40 MG tablet Commonly known as: LASIX Take 1 tablet (40 mg total) by mouth daily.   levothyroxine 75 MCG tablet Commonly known as: SYNTHROID Take 75 mcg by mouth daily before breakfast.   metoprolol tartrate 25 MG tablet Commonly known as: LOPRESSOR Take 1 tablet (25 mg total) by mouth 2 (two) times daily.   omeprazole 40 MG capsule Commonly known as: PRILOSEC Take 1 capsule (40 mg total) by mouth daily as needed (acid reflux).   potassium chloride SA 20 MEQ tablet Commonly known as: KLOR-CON Take 1 tablet (20 mEq total) by mouth daily.        Allergies  Allergen Reactions   Penicillins Anaphylaxis  and Other (See Comments)    Has patient had a PCN reaction causing immediate rash, facial/tongue/throat swelling, SOB or lightheadedness with hypotension: Yes Has patient had a PCN reaction causing severe rash involving mucus membranes or skin necrosis: No Has patient had a PCN reaction that  required hospitalization: Yes Has patient had a PCN reaction occurring within the last 10 years: No If all of the above answers are "NO", then may proceed with Cephalosporin use.    Demerol Nausea And Vomiting   Meperidine Nausea And Vomiting   Morphine And Related Nausea And Vomiting    Consultations: Cardiology  Pulmonary    Procedures/Studies: CT Angio Chest Pulmonary Embolism (PE) W or WO Contrast  Result Date: 03/25/2021 CLINICAL DATA:  Shortness of breath.  Suspected pulmonary embolism. EXAM: CT ANGIOGRAPHY CHEST WITH CONTRAST TECHNIQUE: Multidetector CT imaging of the chest was performed using the standard protocol during bolus administration of intravenous contrast. Multiplanar CT image reconstructions and MIPs were obtained to evaluate the vascular anatomy. CONTRAST:  126mL OMNIPAQUE IOHEXOL 350 MG/ML SOLN COMPARISON:  03/09/2009 FINDINGS: Cardiovascular: Study has technical limitations due to motion artifact and poor opacification of the pulmonary arteries beyond the main and lobar pulmonary arteries. In particular, there is limited evaluation of the upper pulmonary arteries. No evidence for a large or central pulmonary embolism. Coronary artery calcifications. Normal caliber of the thoracic aorta. Atherosclerotic calcifications involving the thoracic aorta. Heart size is within normal limits. No significant pericardial effusion. Mediastinum/Nodes: Mild fullness in the subcarinal soft tissue with calcifications. Findings could be associated with old granulomatous disease. No axillary lymph node enlargement. Lungs/Pleura: Significant motion artifact limits evaluation of the lungs. Peripheral densities in left lower lobe are nonspecific but could represent atelectasis. No large areas of airspace disease or lung consolidation. No significant pleural fluid. Upper Abdomen: Cholecystectomy. 2.3 cm partially visualized left adrenal nodule is new or enlarged since 2010. Musculoskeletal: No acute  bone abnormality. Review of the MIP images confirms the above findings. IMPRESSION: 1. Technically challenging examination and limited evaluation for pulmonary embolism. No evidence for a large or central pulmonary embolism. 2. Limited evaluation of both lungs due to significant motion artifact. Patchy lung densities in the lungs are suggestive for areas of atelectasis but nonspecific. No large areas of airspace disease or lung consolidation. 3. Evidence of old granulomatous disease. 4. **An incidental finding of potential clinical significance has been found. Indeterminate 2.3 cm left adrenal nodule. This is new or enlarged since 2010. Recommend further characterization with a dedicated adrenal CT. ** 5.  Aortic Atherosclerosis (ICD10-I70.0). Electronically Signed   By: Markus Daft M.D.   On: 03/25/2021 12:46   NM Pulmonary Perf and Vent  Result Date: 03/24/2021 Lenore Manner     03/24/2021  4:52 PM VQ SCAN WILL BE ATTEMPTED TUES AM IF RENAL LABS ARE STILL ELEVATED PER DR Starla Link. PT UNABLE TO LIE FLAT  FOR CT - WILL RE EVALUATE THIS TOMORROW. OK TO HOLD EXAM FOR NOW PER DR Ander Slade / BLS [facility] [phone]   DG Chest Port 1 View  Result Date: 03/22/2021 CLINICAL DATA:  65 year old female with history of shortness of breath. Nausea, vomiting and diarrhea for the past 4 days. EXAM: PORTABLE CHEST 1 VIEW COMPARISON:  Chest x-ray 03/10/2009. FINDINGS: Lung volumes are normal. Diffuse interstitial prominence and widespread peribronchial cuffing. No confluent consolidative airspace disease. No pleural effusions. No pneumothorax. No definite suspicious appearing pulmonary nodules or masses are noted. Cephalization of the pulmonary vasculature. Cardiomegaly. Upper mediastinal contours  are within normal limits. Aortic atherosclerosis. IMPRESSION: 1. Cardiomegaly with pulmonary venous congestion. 2. There is also prominence of the interstitial markings and diffuse peribronchial cuffing. The possibility of some mild  interstitial pulmonary edema is not excluded, but not strongly favored. The overall appearance favors bronchitis, but further clinical evaluation is recommended. 3. Aortic atherosclerosis. Electronically Signed   By: Vinnie Langton M.D.   On: 03/22/2021 05:53   ECHOCARDIOGRAM COMPLETE  Result Date: 03/22/2021    ECHOCARDIOGRAM REPORT   Patient Name:   [name] Date of Exam: 03/22/2021 Medical Rec #:  [id number]     Height:       66.0 in Accession #:    [id number]    Weight:       374.6 lb Date of Birth:  1956-03-20     BSA:          2.611 m Patient Age:    65 years      BP:           92/57 mmHg Patient Gender: F             HR:           97 bpm. Exam Location:  Inpatient Procedure: 2D Echo, Cardiac Doppler and Color Doppler Indications:    Atrial Fibrillation I48.91  History:        Patient has prior history of Echocardiogram examinations, most                 recent 03/12/2009. Signs/Symptoms:Shortness of Breath; Risk                 Factors:Hypertension and Current Smoker. CKD.  Sonographer:    Vickie Epley RDCS Referring Phys: [place]  Sonographer Comments: Patient is morbidly obese. Patient on Bipap. IMPRESSIONS  1. Left ventricular ejection fraction, by estimation, is 60 to 65%. The left ventricle has normal function. The left ventricle has no regional wall motion abnormalities. Left ventricular diastolic function could not be evaluated.  2. The right ventricle is poorly visualized, but appears to be at least mildly dilated and at least mildly depressed. Right ventricular systolic function is mildly reduced. The right ventricular size is mildly enlarged. Tricuspid regurgitation signal is  inadequate for assessing PA pressure.  3. The mitral valve is normal in structure. No evidence of mitral valve regurgitation. No evidence of mitral stenosis.  4. The aortic valve is normal in structure. Aortic valve regurgitation is not visualized. No aortic stenosis is present.  5. The inferior vena  cava is dilated in size with >50% respiratory variability, suggesting right atrial pressure of 8 mmHg. FINDINGS  Left Ventricle: Left ventricular ejection fraction, by estimation, is 60 to 65%. The left ventricle has normal function. The left ventricle has no regional wall motion abnormalities. The left ventricular internal cavity size was normal in size. There is  no left ventricular hypertrophy. Left ventricular diastolic function could not be evaluated due to atrial fibrillation. Left ventricular diastolic function could not be evaluated. Right Ventricle: The right ventricle is poorly visualized, but appears to be at least mildly dilated and at least mildly depressed. The right ventricular size is mildly enlarged. No increase in right ventricular wall thickness. Right ventricular systolic  function is mildly reduced. Tricuspid regurgitation signal is inadequate for assessing PA pressure. Left Atrium: Left atrial size was normal in size. Right Atrium: Right atrial size was normal in size. Pericardium: There is no evidence of pericardial effusion. Mitral Valve: The mitral  valve is normal in structure. Mild mitral annular calcification. No evidence of mitral valve regurgitation. No evidence of mitral valve stenosis. Tricuspid Valve: The tricuspid valve is normal in structure. Tricuspid valve regurgitation is trivial. No evidence of tricuspid stenosis. Aortic Valve: The aortic valve is normal in structure. Aortic valve regurgitation is not visualized. No aortic stenosis is present. Pulmonic Valve: The pulmonic valve was normal in structure. Pulmonic valve regurgitation is not visualized. No evidence of pulmonic stenosis. Aorta: The aortic root is normal in size and structure. Venous: The inferior vena cava is dilated in size with greater than 50% respiratory variability, suggesting right atrial pressure of 8 mmHg. IAS/Shunts: No atrial level shunt detected by color flow Doppler.  LEFT VENTRICLE PLAX 2D LVIDd:          4.50 cm LVIDs:         3.30 cm LV PW:         1.00 cm LV IVS:        1.00 cm LVOT diam:     2.10 cm LVOT Area:     3.46 cm  RIGHT VENTRICLE TAPSE (M-mode): 1.5 cm LEFT ATRIUM           Index LA diam:      3.50 cm 1.34 cm/m LA Vol (A4C): 54.3 ml 20.80 ml/m   AORTA Ao Root diam: 3.60 cm Ao Asc diam:  4.00 cm  SHUNTS Systemic Diam: 2.10 cm Dani Gobble Croitoru MD Electronically signed by Sanda Klein MD Signature Date/Time: 03/22/2021/3:14:01 PM    Final    ECHO TEE  Result Date: 03/26/2021    TRANSESOPHOGEAL ECHO REPORT   Patient Name:   [name] Date of Exam: 03/26/2021 Medical Rec #:  [id number]     Height:       66.0 in Accession #:    [id number]    Weight:       381.0 lb Date of Birth:  18-Mar-1956     BSA:          2.630 m Patient Age:    83 years      BP:           134/78 mmHg Patient Gender: F             HR:           117 bpm. Exam Location:  Inpatient Procedure: Transesophageal Echo Indications:     Atrial Fibrillation  History:         Patient has prior history of Echocardiogram examinations, most                  recent 03/22/2021. Signs/Symptoms:Shortness of Breath; Risk                  Factors:Hypertension and Current Smoker. CKD.  Sonographer:     Vickie Epley RDCS Referring Phys:  [id number] Darreld Mclean Diagnosing Phys: Candee Furbish MD PROCEDURE: The transesophogeal probe was passed without difficulty through the esophogus of the patient. Sedation performed by different physician. The patient was monitored while under deep sedation. Anesthestetic sedation was provided intravenously by Anesthesiology: 296.48mg  of Propofol, 100mg  of Lidocaine. The patient developed no complications during the procedure. IMPRESSIONS  1. Left ventricular ejection fraction, by estimation, is 50 to 55%. The left ventricle has low normal function. The left ventricle has no regional wall motion abnormalities.  2. Right ventricular systolic function is normal. The right ventricular size is moderately enlarged.  3. Left  atrial size was mildly  dilated. No left atrial/left atrial appendage thrombus was detected.  4. Right atrial size was moderately dilated.  5. The mitral valve is normal in structure. Mild mitral valve regurgitation.  6. The aortic valve is normal in structure. Aortic valve regurgitation is not visualized. FINDINGS  Left Ventricle: Left ventricular ejection fraction, by estimation, is 50 to 55%. The left ventricle has low normal function. The left ventricle has no regional wall motion abnormalities. The left ventricular internal cavity size was normal in size. Right Ventricle: The right ventricular size is moderately enlarged. No increase in right ventricular wall thickness. Right ventricular systolic function is normal. Left Atrium: Left atrial size was mildly dilated. No left atrial/left atrial appendage thrombus was detected. Right Atrium: Right atrial size was moderately dilated. Pericardium: There is no evidence of pericardial effusion. Mitral Valve: The mitral valve is normal in structure. Mild mitral valve regurgitation. Tricuspid Valve: The tricuspid valve is normal in structure. Tricuspid valve regurgitation is mild. Aortic Valve: The aortic valve is normal in structure. Aortic valve regurgitation is not visualized. Pulmonic Valve: The pulmonic valve was normal in structure. Pulmonic valve regurgitation is trivial. Aorta: The aortic root was not well visualized. IAS/Shunts: No atrial level shunt detected by color flow Doppler. Candee Furbish MD Electronically signed by Candee Furbish MD Signature Date/Time: 03/26/2021/1:43:30 PM    Final    VAS [place] LOWER EXTREMITY VENOUS (DVT)  Result Date: 03/23/2021  Lower Venous DVT Study Patient Name:  [name]  Date of Exam:   03/23/2021 Medical Rec #: [id number]      Accession #:    [id number] Date of Birth: [birth year]      Patient Gender: F Patient Age:   65Y Exam Location:  [facility] Procedure:      VAS [place] LOWER EXTREMITY VENOUS (DVT) Referring Phys: [id number]  Rise Patience --------------------------------------------------------------------------------  Indications: Edema and SOB.  Anticoagulation: Heparin. Limitations: Today's examination was severely limited due to patient body habitus, pain intolerance, and inability to reposition. Comparison Study: 03-16-2019 Prior bilateral lower extremity venous was limited                   bu negative for DVT. Performing Technologist: Darlin Coco RDMS,RVT  Examination Guidelines: A complete evaluation includes B-mode imaging, spectral Doppler, color Doppler, and power Doppler as needed of all accessible portions of each vessel. Bilateral testing is considered an integral part of a complete examination. Limited examinations for reoccurring indications may be performed as noted. The reflux portion of the exam is performed with the patient in reverse Trendelenburg.  +---------+---------------+---------+-----------+----------+-------------------+ RIGHT    CompressibilityPhasicitySpontaneityPropertiesThrombus Aging      +---------+---------------+---------+-----------+----------+-------------------+ CFV      Full           Yes      Yes                                      +---------+---------------+---------+-----------+----------+-------------------+ SFJ      Full                                                             +---------+---------------+---------+-----------+----------+-------------------+ FV Prox  Full                                                             +---------+---------------+---------+-----------+----------+-------------------+  FV Mid   Full           Yes      Yes                                      +---------+---------------+---------+-----------+----------+-------------------+ FV Distal               Yes      Yes                                      +---------+---------------+---------+-----------+----------+-------------------+ PFV      Full                                                              +---------+---------------+---------+-----------+----------+-------------------+ POP      Full                                         Unable to examine                                                         popliteal fossa in                                                        sagittal plane due                                                        to positioning/pain +---------+---------------+---------+-----------+----------+-------------------+ PTV                                                   Not visualized      +---------+---------------+---------+-----------+----------+-------------------+ PERO                                                  Not visualized      +---------+---------------+---------+-----------+----------+-------------------+   +---------+---------------+---------+-----------+----------+--------------+ LEFT     CompressibilityPhasicitySpontaneityPropertiesThrombus Aging +---------+---------------+---------+-----------+----------+--------------+ CFV      Full           Yes      Yes                                 +---------+---------------+---------+-----------+----------+--------------+  SFJ      Full                                                        +---------+---------------+---------+-----------+----------+--------------+ FV Prox  Full                                                        +---------+---------------+---------+-----------+----------+--------------+ FV Mid   Full           Yes      Yes                                 +---------+---------------+---------+-----------+----------+--------------+ FV Distal               Yes      Yes                                 +---------+---------------+---------+-----------+----------+--------------+ PFV      Full                                                         +---------+---------------+---------+-----------+----------+--------------+ POP      Full           Yes      Yes                                 +---------+---------------+---------+-----------+----------+--------------+ PTV                                                   Not visualized +---------+---------------+---------+-----------+----------+--------------+ PERO                                                  Not visualized +---------+---------------+---------+-----------+----------+--------------+     Summary: RIGHT: - There is no evidence of deep vein thrombosis in the lower extremity. However, portions of this examination were limited- see technologist comments above.  LEFT: - There is no evidence of deep vein thrombosis in the lower extremity. However, portions of this examination were limited- see technologist comments above.  - No cystic structure found in the popliteal fossa.  *See table(s) above for measurements and observations. Electronically signed by Monica Martinez MD on 03/23/2021 at 2:56:53 PM.    Final      Procedures: TEE and DC cardioversion   Subjective: Patient is feeling better, dyspnea continue to improve, no chest pain,   Discharge Exam: Vitals:   03/27/21 0800 03/27/21 0900  BP: (!) 131/55 138/71  Pulse: (!) 54 94  Resp: (!) 23  18  Temp: (!) 97.5 F (36.4 C)   SpO2: 95% 96%   Vitals:   03/27/21 0500 03/27/21 0700 03/27/21 0800 03/27/21 0900  BP:  (!) 128/55 (!) 131/55 138/71  Pulse:  (!) 54 (!) 54 94  Resp:  (!) 23 (!) 23 18  Temp:   (!) 97.5 F (36.4 C)   TempSrc:   Oral   SpO2:  94% 95% 96%  Weight: (!) 172 kg     Height:        General: Not in pain or dyspnea  Neurology: Awake and alert, non focal  E ENT: no pallor, no icterus, oral mucosa moist Cardiovascular: No JVD. S1-S2 present, rhythmic, no gallops, rubs, or murmurs. Positive ++ non pitting bilateral lower extremity edema. Pulmonary: positive breath sounds bilaterally,  with no wheezing, rhonchi or rales. Gastrointestinal. Abdomen soft and non tender Skin. No rashes Musculoskeletal: no joint deformities   The results of significant diagnostics from this hospitalization (including imaging, microbiology, ancillary and laboratory) are listed below for reference.     Microbiology: Recent Results (from the past 240 hour(s))  Resp Panel by RT-PCR (Flu A&B, Covid) Nasopharyngeal Swab     Status: None   Collection Time: 03/22/21  5:20 AM   Specimen: Nasopharyngeal Swab; Nasopharyngeal(NP) swabs in vial transport medium  Result Value Ref Range Status   SARS Coronavirus 2 by RT PCR NEGATIVE NEGATIVE Final    Comment: (NOTE) SARS-CoV-2 target nucleic acids are NOT DETECTED.  The SARS-CoV-2 RNA is generally detectable in upper respiratory specimens during the acute phase of infection. The lowest concentration of SARS-CoV-2 viral copies this assay can detect is 138 copies/mL. A negative result does not preclude SARS-Cov-2 infection and should not be used as the sole basis for treatment or other patient management decisions. A negative result may occur with  improper specimen collection/handling, submission of specimen other than nasopharyngeal swab, presence of viral mutation(s) within the areas targeted by this assay, and inadequate number of viral copies(<138 copies/mL). A negative result must be combined with clinical observations, patient history, and epidemiological information. The expected result is Negative.  Fact Sheet for Patients:  EntrepreneurPulse.com.au  Fact Sheet for Healthcare Providers:  IncredibleEmployment.be  This test is no t yet approved or cleared by the [place] FDA and  has been authorized for detection and/or diagnosis of SARS-CoV-2 by FDA under an Emergency Use Authorization (EUA). This EUA will remain  in effect (meaning this test can be used) for the duration of the COVID-19  declaration under Section 564(b)(1) of the Act, 21 U.S.C.section 360bbb-3(b)(1), unless the authorization is terminated  or revoked sooner.       Influenza A by PCR NEGATIVE NEGATIVE Final   Influenza B by PCR NEGATIVE NEGATIVE Final    Comment: (NOTE) The Xpert Xpress SARS-CoV-2/FLU/RSV plus assay is intended as an aid in the diagnosis of influenza from Nasopharyngeal swab specimens and should not be used as a sole basis for treatment. Nasal washings and aspirates are unacceptable for Xpert Xpress SARS-CoV-2/FLU/RSV testing.  Fact Sheet for Patients: EntrepreneurPulse.com.au  Fact Sheet for Healthcare Providers: IncredibleEmployment.be  This test is not yet approved or cleared by the [place] FDA and has been authorized for detection and/or diagnosis of SARS-CoV-2 by FDA under an Emergency Use Authorization (EUA). This EUA will remain in effect (meaning this test can be used) for the duration of the COVID-19 declaration under Section 564(b)(1) of the Act, 21 U.S.C. section 360bbb-3(b)(1), unless the authorization is terminated or revoked.  Performed at [facility], [address], [place], [place] [address]   Culture, blood (routine x 2)     Status: None (Preliminary result)   Collection Time: 03/22/21 12:10 PM   Specimen: BLOOD  Result Value Ref Range Status   Specimen Description   Final    BLOOD LEFT ARM Performed at [facility] [address] [place], [place] [address]    Special Requests   Final    BOTTLES DRAWN AEROBIC AND ANAEROBIC Blood Culture results may not be optimal due to an inadequate volume of blood received in culture bottles Performed at [facility] [address], [place], [place] [address]    Culture   Final    NO GROWTH 4 DAYS Performed at [facility], [place] [address] [place], [place] [address]    Report Status PENDING  Incomplete   Culture, blood (routine x 2)     Status: None (Preliminary result)   Collection Time: 03/22/21 12:10 PM   Specimen: BLOOD  Result Value Ref Range Status   Specimen Description   Final    BLOOD LEFT HAND Performed at [facility] [address] [place], [place] [address]    Special Requests   Final    BOTTLES DRAWN AEROBIC AND ANAEROBIC Blood Culture results may not be optimal due to an inadequate volume of blood received in culture bottles Performed at [facility] [address] [place], [place] [address]    Culture   Final    NO GROWTH 4 DAYS Performed at [facility], [place] [address] [place], [place] [address]    Report Status PENDING  Incomplete  MRSA Next Gen by PCR, Nasal     Status: None   Collection Time: 03/22/21  2:00 PM   Specimen: Nasal Mucosa; Nasal Swab  Result Value Ref Range Status   MRSA by PCR Next Gen NOT DETECTED NOT DETECTED Final    Comment: (NOTE) The GeneXpert MRSA Assay (FDA approved for NASAL specimens only), is one component of a comprehensive MRSA colonization surveillance program. It is not intended to diagnose MRSA infection nor to guide or monitor treatment for MRSA infections. Test performance is not FDA approved in patients less than 71 years old. Performed at [facility], [place] [address], [place], [place] [address]      Labs: BNP (last 3 results) Recent Labs    03/22/21 0629  BNP 917.9*   Basic Metabolic Panel: Recent Labs  Lab 03/23/21 0454 03/24/21 0316 03/25/21 0250 03/26/21 0252 03/27/21 0241  NA 139 139 139 139 138  K 4.1 4.0 3.6 3.4* 3.8  CL 101 102 103 103 103  CO2 29 28 26 26 28   GLUCOSE 111* 86 83 116* 119*  BUN 21 27* 27* 22 21  CREATININE 1.71* 1.32* 1.10* 0.91 0.97  CALCIUM 8.1* 8.4* 8.7* 8.8* 8.8*  MG  --  2.0 1.9 1.8 2.1   Liver Function Tests: Recent Labs  Lab 03/22/21 0241 03/22/21 1130 03/24/21 0316  AST 14* 19 18  ALT 11 17 17    ALKPHOS 48 52 47  BILITOT 0.6 0.8 0.6  PROT 7.3 8.0 7.2  ALBUMIN 3.5 3.4* 3.1*   Recent Labs  Lab 03/22/21 0241  LIPASE <10*   No results for input(s): AMMONIA in the last 168 hours. CBC: Recent Labs  Lab 03/22/21 0241 03/22/21 0620 03/22/21 1130 03/23/21 0454 03/24/21 0316 03/25/21 0250 03/26/21 0252  WBC  11.3*  --  12.1* 11.9* 7.6 7.2 6.7  NEUTROABS 8.4*  --  9.8*  --   --   --   --   HGB 13.7   < > 14.0 12.8 12.6 11.9* 12.6  HCT 44.4   < > 47.3* 42.3 42.0 40.5 41.7  MCV 89.7  --  95.4 94.0 94.6 95.1 93.3  PLT 171  --  203 117* 120* 118* 130*   < > = values in this interval not displayed.   Cardiac Enzymes: No results for input(s): CKTOTAL, CKMB, CKMBINDEX, TROPONINI in the last 168 hours. BNP: Invalid input(s): POCBNP CBG: Recent Labs  Lab 03/25/21 2356 03/26/21 0620 03/26/21 1259 03/26/21 1747 03/26/21 2316  GLUCAP 133* 107* 111* 140* 149*   D-Dimer No results for input(s): DDIMER in the last 72 hours. Hgb A1c No results for input(s): HGBA1C in the last 72 hours. Lipid Profile Recent Labs    03/27/21 0241  CHOL 126  HDL 27*  LDLCALC 78  TRIG 103  CHOLHDL 4.7   Thyroid function studies Recent Labs    03/24/21 1539  TSH 3.494   Anemia work up No results for input(s): VITAMINB12, FOLATE, FERRITIN, TIBC, IRON, RETICCTPCT in the last 72 hours. Urinalysis    Component Value Date/Time   COLORURINE AMBER (A) 03/22/2021 1530   APPEARANCEUR CLOUDY (A) 03/22/2021 1530   LABSPEC 1.017 03/22/2021 1530   PHURINE 5.0 03/22/2021 1530   GLUCOSEU NEGATIVE 03/22/2021 1530   HGBUR SMALL (A) 03/22/2021 1530   BILIRUBINUR NEGATIVE 03/22/2021 1530   KETONESUR NEGATIVE 03/22/2021 1530   PROTEINUR 100 (A) 03/22/2021 1530   UROBILINOGEN 1.0 03/11/2009 0437   NITRITE NEGATIVE 03/22/2021 1530   LEUKOCYTESUR SMALL (A) 03/22/2021 1530   Sepsis Labs Invalid input(s): PROCALCITONIN,  WBC,  LACTICIDVEN Microbiology Recent Results (from the past 240 hour(s))   Resp Panel by RT-PCR (Flu A&B, Covid) Nasopharyngeal Swab     Status: None   Collection Time: 03/22/21  5:20 AM   Specimen: Nasopharyngeal Swab; Nasopharyngeal(NP) swabs in vial transport medium  Result Value Ref Range Status   SARS Coronavirus 2 by RT PCR NEGATIVE NEGATIVE Final    Comment: (NOTE) SARS-CoV-2 target nucleic acids are NOT DETECTED.  The SARS-CoV-2 RNA is generally detectable in upper respiratory specimens during the acute phase of infection. The lowest concentration of SARS-CoV-2 viral copies this assay can detect is 138 copies/mL. A negative result does not preclude SARS-Cov-2 infection and should not be used as the sole basis for treatment or other patient management decisions. A negative result may occur with  improper specimen collection/handling, submission of specimen other than nasopharyngeal swab, presence of viral mutation(s) within the areas targeted by this assay, and inadequate number of viral copies(<138 copies/mL). A negative result must be combined with clinical observations, patient history, and epidemiological information. The expected result is Negative.  Fact Sheet for Patients:  EntrepreneurPulse.com.au  Fact Sheet for Healthcare Providers:  IncredibleEmployment.be  This test is no t yet approved or cleared by the [place] FDA and  has been authorized for detection and/or diagnosis of SARS-CoV-2 by FDA under an Emergency Use Authorization (EUA). This EUA will remain  in effect (meaning this test can be used) for the duration of the COVID-19 declaration under Section 564(b)(1) of the Act, 21 U.S.C.section 360bbb-3(b)(1), unless the authorization is terminated  or revoked sooner.       Influenza A by PCR NEGATIVE NEGATIVE Final   Influenza B by PCR NEGATIVE NEGATIVE Final  Comment: (NOTE) The Xpert Xpress SARS-CoV-2/FLU/RSV plus assay is intended as an aid in the diagnosis of influenza from  Nasopharyngeal swab specimens and should not be used as a sole basis for treatment. Nasal washings and aspirates are unacceptable for Xpert Xpress SARS-CoV-2/FLU/RSV testing.  Fact Sheet for Patients: EntrepreneurPulse.com.au  Fact Sheet for Healthcare Providers: IncredibleEmployment.be  This test is not yet approved or cleared by the [place] FDA and has been authorized for detection and/or diagnosis of SARS-CoV-2 by FDA under an Emergency Use Authorization (EUA). This EUA will remain in effect (meaning this test can be used) for the duration of the COVID-19 declaration under Section 564(b)(1) of the Act, 21 U.S.C. section 360bbb-3(b)(1), unless the authorization is terminated or revoked.  Performed at [facility], [address], [place], [place] [address]   Culture, blood (routine x 2)     Status: None (Preliminary result)   Collection Time: 03/22/21 12:10 PM   Specimen: BLOOD  Result Value Ref Range Status   Specimen Description   Final    BLOOD LEFT ARM Performed at [facility] [address], [place], [place] [address]    Special Requests   Final    BOTTLES DRAWN AEROBIC AND ANAEROBIC Blood Culture results may not be optimal due to an inadequate volume of blood received in culture bottles Performed at [facility] [address] [place], [place] [address]    Culture   Final    NO GROWTH 4 DAYS Performed at [facility], [place] [address] [place], [place] [address]    Report Status PENDING  Incomplete  Culture, blood (routine x 2)     Status: None (Preliminary result)   Collection Time: 03/22/21 12:10 PM   Specimen: BLOOD  Result Value Ref Range Status   Specimen Description   Final    BLOOD LEFT HAND Performed at [facility] [address], [place], [place] [address]    Special Requests   Final    BOTTLES DRAWN AEROBIC AND ANAEROBIC  Blood Culture results may not be optimal due to an inadequate volume of blood received in culture bottles Performed at [facility] [address], [place], [place] [address]    Culture   Final    NO GROWTH 4 DAYS Performed at [facility], [place] [address] [place], [place] [address]    Report Status PENDING  Incomplete  MRSA Next Gen by PCR, Nasal     Status: None   Collection Time: 03/22/21  2:00 PM   Specimen: Nasal Mucosa; Nasal Swab  Result Value Ref Range Status   MRSA by PCR Next Gen NOT DETECTED NOT DETECTED Final    Comment: (NOTE) The GeneXpert MRSA Assay (FDA approved for NASAL specimens only), is one component of a comprehensive MRSA colonization surveillance program. It is not intended to diagnose MRSA infection nor to guide or monitor treatment for MRSA infections. Test performance is not FDA approved in patients less than 40 years old. Performed at [facility], [place] [address], [place], [place] [address]      Time coordinating discharge: 45 minutes  SIGNED:   Tawni Millers, MD  [facility] 03/27/2021, 9:56 AM

## 2021-03-27 NOTE — Progress Notes (Signed)
Progress Note  Patient Name: [name] Date of Encounter: 03/27/2021  [facility] Cardiologist: Skeet Latch, MD   Subjective   Abd sx have improved, SOB seems to be at baseline, no palpitations Was told they are ordering in-home sleep study as outpt Says N&V mainly occurred after meals  Inpatient Medications    Scheduled Meds:  amiodarone  200 mg Oral BID   Followed by   Derrill Memo ON 04/09/2021] amiodarone  200 mg Oral Daily   apixaban  5 mg Oral BID   atorvastatin  40 mg Oral Daily   chlorhexidine  15 mL Mouth Rinse BID   Chlorhexidine Gluconate Cloth  6 each Topical Daily   escitalopram  40 mg Oral QHS   furosemide  40 mg Intravenous Once   levothyroxine  75 mcg Oral Q0600   mouth rinse  15 mL Mouth Rinse q12n4p   metoprolol tartrate  50 mg Oral BID   Continuous Infusions:  PRN Meds: acetaminophen **OR** acetaminophen, traMADol   Vital Signs    Vitals:   03/27/21 0339 03/27/21 0400 03/27/21 0500 03/27/21 0700  BP:  (!) 113/46  (!) 128/55  Pulse:  (!) 53  (!) 54  Resp:  14  (!) 23  Temp: 97.6 F (36.4 C)     TempSrc: Oral     SpO2:  93%  94%  Weight:   (!) 172 kg   Height:        Intake/Output Summary (Last 24 hours) at 03/27/2021 0741 Last data filed at 03/27/2021 0400 Gross per 24 hour  Intake 100 ml  Output 250 ml  Net -150 ml   Last 3 Weights 03/27/2021 03/26/2021 03/26/2021  Weight (lbs) 379 lb 3.1 oz 380 lb 15.3 oz 380 lb 15.3 oz  Weight (kg) 172 kg 172.8 kg 172.8 kg      Telemetry    SR, sinus brady to high 40s overnight, PACs  - Personally Reviewed  ECG    No new ECG tracing today. - Personally Reviewed  Physical Exam   General: Well developed, morbidly obese, female in no acute distress Head: Eyes PERRLA, Head normocephalic and atraumatic Lungs: decreased BS bases w/ few rales bilaterally to auscultation. Heart: HRRR S1 S2, without rub or gallop. No murmur. upper extremity pulses are 2+ & equal. No JVD. Abdomen: Bowel sounds  are present, abdomen soft and slightly tender without masses or  hernias noted. Msk: Weak strength and tone for age. Extremities: No clubbing, cyanosis, 1+ edema. U/A to palpate DP pulses due to edema Skin:  No rashes or lesions noted. Neuro: Alert and oriented X 3. Psych:  Good affect, responds appropriately  Labs    High Sensitivity Troponin:   Recent Labs  Lab 03/22/21 0629 03/22/21 0829 03/22/21 1130  TROPONINIHS 9 10 9       Chemistry Recent Labs  Lab 03/22/21 0241 03/22/21 0620 03/22/21 1130 03/23/21 0454 03/24/21 0316 03/25/21 0250 03/26/21 0252 03/27/21 0241  NA 140   < > 142   < > 139 139 139 138  K 3.4*   < > 3.9   < > 4.0 3.6 3.4* 3.8  CL 102  --  102   < > 102 103 103 103  CO2 29  --  32   < > 28 26 26 28   GLUCOSE 123*  --  137*   < > 86 83 116* 119*  BUN 12  --  16   < > 27* 27* 22 21  CREATININE  0.75  --  1.12*   < > 1.32* 1.10* 0.91 0.97  CALCIUM 8.8*  --  8.6*   < > 8.4* 8.7* 8.8* 8.8*  PROT 7.3  --  8.0  --  7.2  --   --   --   ALBUMIN 3.5  --  3.4*  --  3.1*  --   --   --   AST 14*  --  19  --  18  --   --   --   ALT 11  --  17  --  17  --   --   --   ALKPHOS 48  --  52  --  47  --   --   --   BILITOT 0.6  --  0.8  --  0.6  --   --   --   GFRNONAA >60  --  54*   < > 45* 55* >60 >60  ANIONGAP 9  --  8   < > 9 10 10 7    < > = values in this interval not displayed.     Hematology Recent Labs  Lab 03/24/21 0316 03/25/21 0250 03/26/21 0252  WBC 7.6 7.2 6.7  RBC 4.44 4.26 4.47  HGB 12.6 11.9* 12.6  HCT 42.0 40.5 41.7  MCV 94.6 95.1 93.3  MCH 28.4 27.9 28.2  MCHC 30.0 29.4* 30.2  RDW 15.7* 15.5 15.6*  PLT 120* 118* 130*    BNP Recent Labs  Lab 03/22/21 0629  BNP 158.6*     DDimer  Recent Labs  Lab 03/22/21 0629  DDIMER >20.00*     Radiology    CT Angio Chest Pulmonary Embolism (PE) W or WO Contrast  Result Date: 03/25/2021 CLINICAL DATA:  Shortness of breath.  Suspected pulmonary embolism. EXAM: CT ANGIOGRAPHY CHEST WITH  CONTRAST TECHNIQUE: Multidetector CT imaging of the chest was performed using the standard protocol during bolus administration of intravenous contrast. Multiplanar CT image reconstructions and MIPs were obtained to evaluate the vascular anatomy. CONTRAST:  161mL OMNIPAQUE IOHEXOL 350 MG/ML SOLN COMPARISON:  03/09/2009 FINDINGS: Cardiovascular: Study has technical limitations due to motion artifact and poor opacification of the pulmonary arteries beyond the main and lobar pulmonary arteries. In particular, there is limited evaluation of the upper pulmonary arteries. No evidence for a large or central pulmonary embolism. Coronary artery calcifications. Normal caliber of the thoracic aorta. Atherosclerotic calcifications involving the thoracic aorta. Heart size is within normal limits. No significant pericardial effusion. Mediastinum/Nodes: Mild fullness in the subcarinal soft tissue with calcifications. Findings could be associated with old granulomatous disease. No axillary lymph node enlargement. Lungs/Pleura: Significant motion artifact limits evaluation of the lungs. Peripheral densities in left lower lobe are nonspecific but could represent atelectasis. No large areas of airspace disease or lung consolidation. No significant pleural fluid. Upper Abdomen: Cholecystectomy. 2.3 cm partially visualized left adrenal nodule is new or enlarged since 2010. Musculoskeletal: No acute bone abnormality. Review of the MIP images confirms the above findings. IMPRESSION: 1. Technically challenging examination and limited evaluation for pulmonary embolism. No evidence for a large or central pulmonary embolism. 2. Limited evaluation of both lungs due to significant motion artifact. Patchy lung densities in the lungs are suggestive for areas of atelectasis but nonspecific. No large areas of airspace disease or lung consolidation. 3. Evidence of old granulomatous disease. 4. **An incidental finding of potential clinical  significance has been found. Indeterminate 2.3 cm left adrenal nodule. This is new or enlarged since 2010.  Recommend further characterization with a dedicated adrenal CT. ** 5.  Aortic Atherosclerosis (ICD10-I70.0). Electronically Signed   By: Markus Daft M.D.   On: 03/25/2021 12:46   ECHO TEE  Result Date: 03/26/2021    TRANSESOPHOGEAL ECHO REPORT   Patient Name:   ELLENA KAMEN Date of Exam: 03/26/2021 Medical Rec #:  [id number]     Height:       66.0 in Accession #:    [id number]    Weight:       381.0 lb Date of Birth:  August 12, 1956     BSA:          2.630 m Patient Age:    65 years      BP:           134/78 mmHg Patient Gender: F             HR:           117 bpm. Exam Location:  Inpatient Procedure: Transesophageal Echo Indications:     Atrial Fibrillation  History:         Patient has prior history of Echocardiogram examinations, most                  recent 03/22/2021. Signs/Symptoms:Shortness of Breath; Risk                  Factors:Hypertension and Current Smoker. CKD.  Sonographer:     Vickie Epley RDCS Referring Phys:  [id number] Darreld Mclean Diagnosing Phys: Candee Furbish MD PROCEDURE: The transesophogeal probe was passed without difficulty through the esophogus of the patient. Sedation performed by different physician. The patient was monitored while under deep sedation. Anesthestetic sedation was provided intravenously by Anesthesiology: 296.48mg  of Propofol, 100mg  of Lidocaine. The patient developed no complications during the procedure. IMPRESSIONS  1. Left ventricular ejection fraction, by estimation, is 50 to 55%. The left ventricle has low normal function. The left ventricle has no regional wall motion abnormalities.  2. Right ventricular systolic function is normal. The right ventricular size is moderately enlarged.  3. Left atrial size was mildly dilated. No left atrial/left atrial appendage thrombus was detected.  4. Right atrial size was moderately dilated.  5. The mitral valve is normal in  structure. Mild mitral valve regurgitation.  6. The aortic valve is normal in structure. Aortic valve regurgitation is not visualized. FINDINGS  Left Ventricle: Left ventricular ejection fraction, by estimation, is 50 to 55%. The left ventricle has low normal function. The left ventricle has no regional wall motion abnormalities. The left ventricular internal cavity size was normal in size. Right Ventricle: The right ventricular size is moderately enlarged. No increase in right ventricular wall thickness. Right ventricular systolic function is normal. Left Atrium: Left atrial size was mildly dilated. No left atrial/left atrial appendage thrombus was detected. Right Atrium: Right atrial size was moderately dilated. Pericardium: There is no evidence of pericardial effusion. Mitral Valve: The mitral valve is normal in structure. Mild mitral valve regurgitation. Tricuspid Valve: The tricuspid valve is normal in structure. Tricuspid valve regurgitation is mild. Aortic Valve: The aortic valve is normal in structure. Aortic valve regurgitation is not visualized. Pulmonic Valve: The pulmonic valve was normal in structure. Pulmonic valve regurgitation is trivial. Aorta: The aortic root was not well visualized. IAS/Shunts: No atrial level shunt detected by color flow Doppler. Candee Furbish MD Electronically signed by Candee Furbish MD Signature Date/Time: 03/26/2021/1:43:30 PM    Final     Cardiac Studies  TEE/DCCV:  Indications: atrial fibrillation   Time out performed   Propofol per anesthesia supervision   Findings:   Left Ventricle: EF 65%   Mitral Valve: Mild MR   Aortic Valve: Normal   Tricuspid Valve: Moderate TR   Left Atrium: Mildly dilated. No LAA thrombus   Right Atrium: Moderate dilated.   Candee Furbish, MD  Procedure: Electrical Cardioversion Indications:  Atrial Fibrillation   Time Out: Verified patient identification, verified procedure,medications/allergies/relevent history reviewed,  required imaging and test results available.  Performed   Procedure Details   The patient was NPO after midnight. Anesthesia was administered at the beside  by Dr.Finucane with propofol.  Cardioversion was performed with synchronized biphasic defibrillation via AP pads with 200 joules.  3 attempt(s) were performed with compression.  The patient converted to normal sinus rhythm. The patient tolerated the procedure well   IMPRESSION:  Successful cardioversion of atrial fibrillation after 3 shocks.   Echocardiogram 03/22/2021: Impressions: 1. Left ventricular ejection fraction, by estimation, is 60 to 65%. The left ventricle has normal function. The left ventricle has no regional wall motion abnormalities. Left ventricular diastolic function could not be evaluated.   2. The right ventricle is poorly visualized, but appears to be at least mildly dilated and at least mildly depressed. Right ventricular systolic function is mildly reduced. The right ventricular size is mildly enlarged. Tricuspid regurgitation signal is  inadequate for assessing PA pressure.   3. The mitral valve is normal in structure. No evidence of mitral valve regurgitation. No evidence of mitral stenosis.   4. The aortic valve is normal in structure. Aortic valve regurgitation is not visualized. No aortic stenosis is present.   5. The inferior vena cava is dilated in size with >50% respiratory  variability, suggesting right atrial pressure of 8 mmHg.  Patient Profile     65 y.o. female with a history of hypertension, hypothyroidism, left leg superficial thrombophlebitis, CKD stage III, GERD, Hepatitis B/C, avascular necrosis of the hips, and morbid obesity with BMI of 60 who is being for the evaluation of atrial fibrillation at the request of Dr. Starla Link.  Assessment & Plan    New Onset Atrial Fibrillation with RVR - s/p TEE/DCCV 07/20, maintaining SR on amio - feel bradycardia at least partly related to likely OSA >> sleep  study as outpt - no change in BB dose - TSH nl, ez neg MI - LVEF nl w/ decreased RV function, ?2nd resp issues - cont amio 200 mg bid x 2 weeks, then 200 mg qd starting 08/03   Hypertension - BP well-controlled on current Rx   Acute Combined Respiratory Failure - initially on Bipap w/ resp acidosis - concern for PE w/ d-dimer > 20, but CT neg and pt will be on Eliquis regardless - get sleep study - no indication for diuresis at this time but  resp status difficult to assess 2nd chronic conditions - per IM   AKI - Resolved  - Cr peak 1.71, improved   Morbid Obesity - per IM   Otherwise, per primary team: - RUQ pain with nausea/vomiting/diarrhea >> improved - Hypothyroidism > TSH ok - History of hepatitis B/C > LFTs ok - Thrombocytopenia > plt have been slightly low at times for years, follow on Eliquis  Plan: ok w/ Cards to d/c  For questions or updates, please contact [facility] Please consult [email or web address] for contact info under        Signed, Rosaria Ferries, PA-C  03/27/2021, 7:41 AM

## 2021-03-27 NOTE — TOC Transition Note (Signed)
Transition of Care [facility]) - CM/SW Discharge Note   Patient Details  Name: IVIONA HOLE MRN: [id number] Date of Birth: 1956-03-18  [facility] CM/SW Contact:  Leeroy Cha, RN Phone Number: 03/27/2021, 1:49 PM   Clinical Narrative:    Med necessity form and packet with needed information for transport given to unit clerk Linnell Camp. RN will call ptar when ready to go    Barriers to Discharge: Continued Medical Work up   Patient Goals and CMS Choice Patient states their goals for this hospitalization and ongoing recovery are:: to go home CMS Medicare.gov Compare Post Acute Care list provided to:: Patient Choice offered to / list presented to : Patient  Discharge Placement                       Discharge Plan and Services   Discharge Planning Services: CM Consult            DME Arranged: Oxygen DME Agency: [employer] Date DME Agency Contacted: 03/27/21 Time DME Agency Contacted: 76 Representative spoke with at DME Agency: zack blank            Social Determinants of Health ([place]) Interventions     Readmission Risk Interventions No flowsheet data found.

## 2021-03-27 NOTE — TOC Progression Note (Signed)
Transition of Care [facility]) - Progression Note    Patient Details  Name: [name] MRN: [id number] Date of Birth: [birth year]  Transition of Care [facility]) CM/SW Contact  Leeroy Cha, RN Phone Number: 03/27/2021, 10:05 AM  Clinical Narrative:    O2 for home ordered through [employer].   Expected Discharge Plan: Home/Self Care Barriers to Discharge: Continued Medical Work up  Expected Discharge Plan and Services Expected Discharge Plan: Home/Self Care   Discharge Planning Services: CM Consult   Living arrangements for the past 2 months: Single Family Home                 DME Arranged: Oxygen DME Agency: [employer] Date DME Agency Contacted: 03/27/21 Time DME Agency Contacted: 74 Representative spoke with at DME Agency: zack blank             Social Determinants of Health ([place]) Interventions    Readmission Risk Interventions No flowsheet data found.

## 2021-03-27 NOTE — Progress Notes (Signed)
NAME:  [name], MRN:  [id number], DOB:  1956-09-03, LOS: 5 ADMISSION DATE:  03/22/2021, CONSULTATION DATE:  03/23/21 REFERRING MD:  TR[facility], Dr Starla Link, CHIEF COMPLAINT:  Respiratory distress, requiring BIPAP   History of Present Illness:  65 y/o F who presented to the ED 7/16 with a history of 4 days of loose stools, and poor p.o. intake with right-sided abdominal pain.  Given pain medication and sent to radiology for CT abdomen/pelvis but unable to tolerate it due to pain in her back and hips.  She was then noted to be hypoxic and placed on 5 L oxygen.  ABG showed 7.1 8/82/124. She also developed atrial fibrillation with RVR, placed on Cardizem for rate control, BiPAP and admitted to ICU Due to elevated D-dimer, CT angiogram chest was attempted but she was unable to lie flat, started on IV heparin. Reportedly there is a history of chronic diarrhea, placed on enteric precautions  PCCM consulted 7/17 for BiPAP management and persistent acute respiratory acidosis  Pertinent  Medical History  Anemia  CKD  Arthritis with Poor Mobility s/p R Knee Replacement   GERD  Hepatitis B Hepatitis C - treated with Harvoni HTN HLD Hypothyroidism  Obesity  AVN  Significant Hospital Events: Including procedures, antibiotic start and stop dates in addition to other pertinent events   7/16 Admit with abd pain, diarrhea. AFwRVR. Elevated D-dimer.  7/17 PCCM consulted for BiPAP mgmt, respiratory acidosis  7/20 To [facility] for cardioversion   Interim History / Subjective:  Afebrile  ONO not completed last night, confirmed with RT  2L O2  I/O 938ml UOP, -236 in last 24 hours Pt denies acute complaints RN reports pt snores loudly when sleeping   Objective   Blood pressure (!) 128/55, pulse (!) 54, temperature 97.6 F (36.4 C), temperature source Oral, resp. rate (!) 23, height 5\' 6"  (1.676 m), weight (!) 172 kg, SpO2 94 %.        Intake/Output Summary (Last 24 hours) at 03/27/2021 0725 Last data  filed at 03/27/2021 0400 Gross per 24 hour  Intake 100 ml  Output 250 ml  Net -150 ml   Filed Weights   03/26/21 0500 03/26/21 0940 03/27/21 0500  Weight: (!) 172.8 kg (!) 172.8 kg (!) 172 kg    Examination: General: adult female sitting up in bed in NAD HEENT: MM pink/moist, wearing glasses, anicteric  Neuro: AAOx4, speech clear, MAE CV: s1s2 RRR, SR 60's, no m/r/g PULM: non-labored on 2L , lungs distant but clear GI: soft, bsx4 active  Extremities: warm/dry, BLE brawny edema  Skin: no rashes or lesions  Resolved Hospital Problem list   AKI  Assessment & Plan:   Acute on chronic hypoxic and hypercarbic respiratory failure Suspected OHS/OSA Admit bicarb >30 on BMP, suggestive of possible OHS.  CTA chest limited but negative for large central PE.  -avoid all sedating medications as able as will contribute to hypercarbia  -assess ONO tonight, discussed with RT.  Will follow up in am.  -wean O2 for sats >90% -anticipate she will need O2 for nocturnal use  -mobilize as able  -given patients mobility issues, she does not feel she will be able to come to the clinic or do a formal sleep study.   Given her resting hypercarbia, doubt CPAP alone would be sufficient.   Atrial fibrillation with RVR S/p cardioversion on 7/20 -per Cardiology  -continue lopressor, anticoagulation    Left Adrenal Nodule -per primary MD   Best Practice (right click  and "Reselect all SmartList Selections" daily)  Diet/type: Regular consistency (see orders) DVT prophylaxis: DOAC GI prophylaxis: N/A Lines: N/A Foley:  N/A Code Status:  full code Last date of multidisciplinary goals of care discussion - per primary service     Noe Gens, MSN, APRN, NP-C, AGACNP-BC [facility] 03/27/2021, 7:25 AM   Please see [email or web address] for pager details.   From 7A-7P if no response, please call [phone] After hours, please call ELink [phone]

## 2021-06-03 ENCOUNTER — Telehealth: Payer: Self-pay | Admitting: Cardiovascular Disease

## 2021-06-03 NOTE — Telephone Encounter (Signed)
Spoke to patient she stated she was discharged from [facility] in July.She wanted to know if she was suppose to continue all her medications.Advised she should take all medications prescribed.Stated she does not need any refills at this time.I will make Dr.Palco aware.

## 2021-06-03 NOTE — Telephone Encounter (Signed)
Pt c/o medication issue:  1. Name of Medication: Eliquis, furosemide, potassium.    2. How are you currently taking this medication (dosage and times per day)? Once a day   3. Are you having a reaction (difficulty breathing--STAT)? No   4. What is your medication issue? Patient was in the hospital and would like to know if she need to keep taking this medication.

## 2021-07-07 NOTE — Progress Notes (Incomplete)
Cardiology Office Note:    Date:  07/07/2021   ID:  [name], DOB 03/30/1956, MRN [id number]  PCP:  Sandi Mariscal, MD   [place] Providers Cardiologist:  Skeet Latch, MD { Click to update primary MD,subspecialty MD or APP then REFRESH:1}    Referring MD: Sandi Mariscal, MD   No chief complaint on file.   History of Present Illness:    [name] is a 65 y.o. female with a hx of atrial fibrillation, here for follow-up. She was first seen in the hospital 03/2021 for new onset atrial fibrillation. She was admitted with abdominal pain, vomiting, and diarrhea. She also had hypoxic and hypercarbic respiratory failure. She was noted to be in atrial fibrillation with RVR and started on IV diltiazem. She has morbid obesity and is unable to transfer independently, therefore she is unable to undergo an outpatient CPAP/Bipap titraion. She was discharged on home oxygen. She had a TEE cardioversion which required 3 shocks.   03/2021 Today,  He/She denies any palpitations, chest pain, or shortness of breath. No lightheadedness, headaches, syncope, orthopnea, or PND. Also has no lower extremity edema or exertional symptoms.   Past Medical History:  Diagnosis Date   Anemia    Arthritis    AVN (avascular necrosis of bone) ([place]) 04/14/2013   Chronic kidney disease    Complication of anesthesia    Decreased mobility    hx. Rt. hip "avascular necrosis" -unable to weight bear long periods, "using wheelchair"..   Depression    GERD (gastroesophageal reflux disease)    Hep B w/o coma    Hep C w/o coma, chronic ([place])    07-20-14 being presently tx. "Harvoni"-good response per pt. will complete in 7days- Dr. Linus Salmons follows [place] [facility].   Hypertension    Hypothyroid    mild- no medication   Obesity    Osteoporosis    Pneumonia    PONV (postoperative nausea and vomiting)    only with Demerol   Shortness of breath dyspnea    still smokes   Superficial  thrombophlebitis    left leg   Transfusion history    70's, 80's ? hepatitis C attributed to past transfusions    Past Surgical History:  Procedure Laterality Date   ABDOMINAL HYSTERECTOMY     APPENDECTOMY     thinks it was removed with hysterectomy   arthroscopies     knee-right x 7   CARDIOVERSION N/A 03/26/2021   Procedure: CARDIOVERSION;  Surgeon: Jerline Pain, MD;  Location: [facility];  Service: Cardiovascular;  Laterality: N/A;   CHOLECYSTECTOMY     COLON SURGERY     COLONOSCOPY     COLONOSCOPY WITH PROPOFOL N/A 02/26/2015   Procedure: COLONOSCOPY WITH PROPOFOL;  Surgeon: Inda Castle, MD;  Location: [facility];  Service: Endoscopy;  Laterality: N/A;   COLONOSCOPY WITH PROPOFOL N/A 03/03/2016   Procedure: COLONOSCOPY WITH PROPOFOL;  Surgeon: Mauri Pole, MD;  Location: [facility];  Service: Endoscopy;  Laterality: N/A;   ESOPHAGOGASTRODUODENOSCOPY (EGD) WITH PROPOFOL N/A 03/03/2016   Procedure: ESOPHAGOGASTRODUODENOSCOPY (EGD) WITH PROPOFOL;  Surgeon: Mauri Pole, MD;  [facility];  Service: Endoscopy;  Laterality: N/A;   JOINT REPLACEMENT     right knee   TEE WITHOUT CARDIOVERSION N/A 03/26/2021   Procedure: TRANSESOPHAGEAL ECHOCARDIOGRAM (TEE);  Surgeon: Jerline Pain, MD;  [facility] END[facility]COPY;  Service: Cardiovascular;  Laterality: N/A;   TOTAL KNEE ARTHROPLASTY      Current Medications:  No outpatient medications have been marked as taking for the 07/08/21 encounter (Appointment) with Skeet Latch, MD.     Allergies:   Penicillins, Demerol, Meperidine, and Morphine and related   Social History   Socioeconomic History   Marital status: Divorced    Spouse name: Not on file   Number of children: Not on file   Years of education: Not on file   Highest education level: Not on file  Occupational History   Not on file  Tobacco Use   Smoking status: Every Day    Packs/day: 0.50    Years: 20.00    Pack years: 10.00    Types:  Cigarettes   Smokeless tobacco: Never   Tobacco comments:    trying to cut back  Vaping Use   Vaping Use: Not on file  Substance and Sexual Activity   Alcohol use: No    Alcohol/week: 0.0 standard drinks   Drug use: No   Sexual activity: Not Currently  Other Topics Concern   Not on file  Social History Narrative   Not on file   Social Determinants of Health   Financial Resource Strain: Not on file  Food Insecurity: Not on file  Transportation Needs: Not on file  Physical Activity: Not on file  Stress: Not on file  Social Connections: Not on file     Family History: The patient's family history includes Cancer in her father and mother; Diabetes in her brother and mother; Heart disease in her mother; Hypertension in her brother, mother, and sister. There is no history of Colon cancer.  ROS:   Please see the history of present illness.    (+) All other systems reviewed and are negative.  EKGs/Labs/Other Studies Reviewed:    The following studies were reviewed today: ***  EKG:    : Sinus ***. Rate *** bpm.  Recent Labs: 03/22/2021: B Natriuretic Peptide 158.6 03/24/2021: ALT 17; TSH 3.494 03/26/2021: Hemoglobin 12.6; Platelets 130 03/27/2021: BUN 21; Creatinine, Ser 0.97; Magnesium 2.1; Potassium 3.8; Sodium 138  Recent Lipid Panel    Component Value Date/Time   CHOL 126 03/27/2021 0241   CHOL 163 06/05/2019 1622   TRIG 103 03/27/2021 0241   HDL 27 (L) 03/27/2021 0241   HDL 39 (L) 06/05/2019 1622   CHOLHDL 4.7 03/27/2021 0241   VLDL 21 03/27/2021 0241   LDLCALC 78 03/27/2021 0241   LDLCALC 103 (H) 06/05/2019 1622     Risk Assessment/Calculations:   {Does this patient have ATRIAL FIBRILLATION?:[phone]}       Physical Exam:    Wt Readings from Last 3 Encounters:  03/27/21 (!) 379 lb 3.1 oz (172 kg)  03/17/19 (!) 331 lb (150.1 kg)  05/08/18 (!) 326 lb 11.6 oz (148.2 kg)     VS:  There were no vitals taken for this visit. , BMI There is no height or  weight on file to calculate BMI. GENERAL:  Well appearing HEENT: Pupils equal round and reactive, fundi not visualized, oral mucosa unremarkable NECK:  No jugular venous distention, waveform within normal limits, carotid upstroke brisk and symmetric, no bruits, no thyromegaly LYMPHATICS:  No cervical adenopathy LUNGS:  Clear to auscultation bilaterally HEART:  RRR.  PMI not displaced or sustained,S1 and S2 within normal limits, no S3, no S4, no clicks, no rubs, *** murmurs ABD:  Flat, positive bowel sounds normal in frequency in pitch, no bruits, no rebound, no guarding, no midline pulsatile mass, no hepatomegaly, no splenomegaly EXT:  2 plus pulses  throughout, no edema, no cyanosis no clubbing SKIN:  No rashes no nodules NEURO:  Cranial nerves II through XII grossly intact, motor grossly intact throughout PSYCH:  Cognitively intact, oriented to person place and time   ASSESSMENT:    No diagnosis found. PLAN:    No problem-specific Assessment & Plan notes found for this encounter.    ***   {Are you ordering a CV Procedure (e.g. stress test, cath, DCCV, TEE, etc)?   Press F2        :[id number]}    Disposition: FU with Tiffany C. Oval Linsey, MD, [facility] in ***  Medication Adjustments/Labs and Tests Ordered: Current medicines are reviewed at length with the patient today.  Concerns regarding medicines are outlined above.   No orders of the defined types were placed in this encounter.  No orders of the defined types were placed in this encounter.   There are no Patient Instructions on file for this visit.    I,Mathew Stumpf,acting as a Education administrator for Skeet Latch, MD.,have documented all relevant documentation on the behalf of Skeet Latch, MD,as directed by  Skeet Latch, MD while in the presence of Skeet Latch, MD.   ***  Signed, Madelin Rear  07/07/2021 3:12 PM    [place]

## 2021-07-08 ENCOUNTER — Ambulatory Visit (HOSPITAL_BASED_OUTPATIENT_CLINIC_OR_DEPARTMENT_OTHER): Payer: Medicare HMO | Admitting: Cardiovascular Disease

## 2021-07-08 ENCOUNTER — Telehealth: Payer: Self-pay | Admitting: Cardiovascular Disease

## 2021-07-08 NOTE — Telephone Encounter (Signed)
  Pt would like to ask Dr. Oval Linsey if she can do a virtual appt with her, she said her transportation is hard to set up and due to her wheelchair it take her 5 hours to set up everything just to go out. She cancelled her appt today she said she really can't to an appt today but after today, any day will be fine with her

## 2021-07-08 NOTE — Telephone Encounter (Signed)
Returned call to patient who states that she needs to do a virtual appointment instead of in office due to transportation issues with her wheel chair. Advised patient I did not see any virtual slots open to schedule for Dr. Oval Linsey but that I would forward her message to Dr. Oval Linsey and her nurse for them to review and advise. Patient verbalized understanding.

## 2021-07-09 NOTE — Telephone Encounter (Signed)
Left message to call back  

## 2021-07-22 NOTE — Telephone Encounter (Signed)
Discussed with Dr Oval Linsey and ok to change to virtual visit Patient has been scheduled for mychart visit, she thanked me call back

## 2021-07-22 NOTE — Telephone Encounter (Signed)
Follow Up:     Patient is returning your call from last week.

## 2021-08-11 ENCOUNTER — Encounter (HOSPITAL_BASED_OUTPATIENT_CLINIC_OR_DEPARTMENT_OTHER): Payer: Self-pay | Admitting: Cardiovascular Disease

## 2021-08-11 ENCOUNTER — Telehealth (INDEPENDENT_AMBULATORY_CARE_PROVIDER_SITE_OTHER): Payer: Medicare HMO | Admitting: Cardiovascular Disease

## 2021-08-11 VITALS — BP 105/55 | HR 55 | Ht 66.0 in | Wt 387.2 lb

## 2021-08-11 DIAGNOSIS — J9602 Acute respiratory failure with hypercapnia: Secondary | ICD-10-CM

## 2021-08-11 DIAGNOSIS — Z79899 Other long term (current) drug therapy: Secondary | ICD-10-CM

## 2021-08-11 DIAGNOSIS — I10 Essential (primary) hypertension: Secondary | ICD-10-CM

## 2021-08-11 DIAGNOSIS — I480 Paroxysmal atrial fibrillation: Secondary | ICD-10-CM

## 2021-08-11 DIAGNOSIS — E039 Hypothyroidism, unspecified: Secondary | ICD-10-CM | POA: Diagnosis not present

## 2021-08-11 MED ORDER — METOPROLOL SUCCINATE ER 25 MG PO TB24: 12.5000 mg | ORAL_TABLET | Freq: Every day | ORAL | 3 refills | Status: DC

## 2021-08-11 NOTE — Patient Instructions (Signed)
Medication Instructions:  STOP- Metoprolol Tartrate START- Metoprolol Succinate 12.5 mg by mouth daily  *If you need a refill on your cardiac medications before your next appointment, please call your pharmacy*   Lab Work: CMP, CBC, TSH for Home Health to draw at next home visit  If you have labs (blood work) drawn today and your tests are completely normal, you will receive your results only by: [place] (if you have MyChart) OR A paper copy in the mail If you have any lab test that is abnormal or we need to change your treatment, we will call you to review the results.   Testing/Procedures: None Ordered   Follow-Up: At [facility], you and your health needs are our priority.  As part of our continuing mission to provide you with exceptional heart care, we have created designated Provider Care Teams.  These Care Teams include your primary Cardiologist (physician) and Advanced Practice Providers (APPs -  Physician Assistants and Nurse Practitioners) who all work together to provide you with the care you need, when you need it.  We recommend signing up for the patient portal called "MyChart".  Sign up information is provided on this After Visit Summary.  MyChart is used to connect with patients for Virtual Visits (Telemedicine).  Patients are able to view lab/test results, encounter notes, upcoming appointments, etc.  Non-urgent messages can be sent to your provider as well.   To learn more about what you can do with MyChart, go to [email or web address].    Your next appointment:   3 month(s)  The format for your next appointment:   In Person  Provider:   Skeet Latch, MD

## 2021-08-11 NOTE — Assessment & Plan Note (Signed)
Continue supplemental oxygen.  She cannot get a sleep study but likely has OSA.

## 2021-08-11 NOTE — Assessment & Plan Note (Signed)
BP is controlled.  IT has been low.  Reduce metoprolol to 12.5mg  daily.

## 2021-08-11 NOTE — Progress Notes (Addendum)
Cardiology Office Note:    Date:  08/11/2021   ID:  PHOENICIA Hobbs, DOB [birth year], MRN [id number]  PCP:  Wendy Mariscal, MD  Cardiologist:  Skeet Latch, MD    Referring MD: Wendy Mariscal, MD   No chief complaint on file.  This visit type was conducted due to national recommendations for restrictions regarding the COVID-19 Pandemic (e.g. social distancing) in an effort to limit this patient's exposure and mitigate transmission in our community.  Due to her co-morbid illnesses, this patient is at least at moderate risk for complications without adequate follow up.  This format is felt to be most appropriate for this patient at this time.  All issues noted in this document were discussed and addressed.  A limited physical exam was performed with this format.  Please refer to the patient's chart for her consent to telehealth for [facility].   Patient location: Home Provider location: Office   This visit was performed via videoconferencing.   History of Present Illness:    [name] is a 65 y.o. female with a hx of hypertension, GERD, CKD, anemia, and obesity presenting via telehealth for evaluation.  She was admitted 03/221 with hyperbaric respiratory failure and new onset atrial fibrillation.Her heart rate was controlled on metoprolol. She was essentially bed-bound but performed successfully TEE and cardioversion prior to discharge. She was thought to likely have OSA but was unable to have sleep study and was discharged on home oxygen. She was started on amiodarone and eliquis and metoprolol was continued.  Today, she has not been doing well. She has been feeling weaker recently. She is only able to take a step or 2 before needing to use a wheelchair. Recently, she has been constantly fatigued even after minimal activity. When she sits back on her potty-chair, she notes the "world seems to stop". Her vision has also been blurry. She records her blood pressure at home and reports the  measurements continue to be high. This morning, she was able to feel her heart beat and noticed it was slow. When she wakes up, she reports clear mucous and wonders if it I related to being on oxygen. She denies any palpitations, chest Hobbs, shortness of breath, lightheadedness, headaches, syncope, orthopnea, PND, or lower extremity edema.   Past Medical History:  Diagnosis Date   Anemia    Arthritis    AVN (avascular necrosis of bone) ([place]) 04/14/2013   Chronic kidney disease    Complication of anesthesia    Decreased mobility    hx. Rt. hip "avascular necrosis" -unable to weight bear long periods, "using wheelchair"..   Depression    GERD (gastroesophageal reflux disease)    Hep B w/o coma    Hep C w/o coma, chronic ([place])    07-20-14 being presently tx. "Harvoni"-good response per pt. will complete in 7days- Dr. Linus Salmons follows [place] [facility].   Hypertension    Hypothyroid    mild- no medication   Obesity    Osteoporosis    PAF (paroxysmal atrial fibrillation) ([place]) 03/22/2021   Pneumonia    PONV (postoperative nausea and vomiting)    only with Demerol   Shortness of breath dyspnea    still smokes   Superficial thrombophlebitis    left leg   Transfusion history    70's, 80's ? hepatitis C attributed to past transfusions    Past Surgical History:  Procedure Laterality Date   ABDOMINAL HYSTERECTOMY     APPENDECTOMY  thinks it was removed with hysterectomy   arthroscopies     knee-right x 7   CARDIOVERSION N/A 03/26/2021   Procedure: CARDIOVERSION;  Surgeon: Wendy Pain, MD;  Location: [facility];  Service: Cardiovascular;  Laterality: N/A;   CHOLECYSTECTOMY     COLON SURGERY     COLONOSCOPY     COLONOSCOPY WITH PROPOFOL N/A 02/26/2015   Procedure: COLONOSCOPY WITH PROPOFOL;  Surgeon: Wendy Castle, MD;  Location: [facility];  Service: Endoscopy;  Laterality: N/A;   COLONOSCOPY WITH PROPOFOL N/A 03/03/2016   Procedure: COLONOSCOPY WITH  PROPOFOL;  Surgeon: Wendy Pole, MD;  [facility]: [facility];  Service: Endoscopy;  Laterality: N/A;   ESOPHAGOGASTRODUODENOSCOPY (EGD) WITH PROPOFOL N/A 03/03/2016   Procedure: ESOPHAGOGASTRODUODENOSCOPY (EGD) WITH PROPOFOL;  Surgeon: Wendy Pole, MD;  [facility]: [facility];  Service: Endoscopy;  Laterality: N/A;   JOINT REPLACEMENT     right knee   TEE WITHOUT CARDIOVERSION N/A 03/26/2021   Procedure: TRANSESOPHAGEAL ECHOCARDIOGRAM (TEE);  Surgeon: Wendy Pain, MD;  Location[facility] ENDOSCOPY;  Service: Cardiovascular;  Laterality: N/A;   TOTAL KNEE ARTHROPLASTY      Current Medications: Current Meds  Medication Sig   acetaminophen (TYLENOL) 325 MG tablet Take 2 tablets (650 mg total) by mouth every 6 (six) hours as needed for mild Hobbs (or Fever >/= 101).   amiodarone (PACERONE) 200 MG tablet Take one tablet twice daily until 04/08/21, then change to one tablet daily starting on 04/09/21.   apixaban (ELIQUIS) 5 MG TABS tablet Take 1 tablet (5 mg total) by mouth 2 (two) times daily.   atorvastatin (LIPITOR) 40 MG tablet Take 1 tablet (40 mg total) by mouth daily.   escitalopram (LEXAPRO) 10 MG tablet TAKE 1 TABLET BY MOUTH EVERY DAY. Need TO keep appointment in June FOR future refills   furosemide (LASIX) 40 MG tablet Take 1 tablet (40 mg total) by mouth daily.   levothyroxine (SYNTHROID) 75 MCG tablet Take 75 mcg by mouth daily before breakfast.   metoprolol succinate (TOPROL XL) 25 MG 24 hr tablet Take 0.5 tablets (12.5 mg total) by mouth daily.   omeprazole (PRILOSEC) 40 MG capsule Take 1 capsule (40 mg total) by mouth daily as needed (acid reflux).   potassium chloride SA (KLOR-CON) 20 MEQ tablet Take 1 tablet (20 mEq total) by mouth daily.   [DISCONTINUED] metoprolol tartrate (LOPRESSOR) 25 MG tablet Take 1 tablet (25 mg total) by mouth 2 (two) times daily.     Allergies:   Penicillins, Demerol, Meperidine, and Morphine and related   Social History   Socioeconomic  History   Marital status: Divorced    Spouse name: Not on file   Number of children: Not on file   Years of education: Not on file   Highest education level: Not on file  Occupational History   Not on file  Tobacco Use   Smoking status: Every Day    Packs/day: 0.50    Years: 20.00    Pack years: 10.00    Types: Cigarettes   Smokeless tobacco: Never   Tobacco comments:    trying to cut back  Vaping Use   Vaping Use: Not on file  Substance and Sexual Activity   Alcohol use: No    Alcohol/week: 0.0 standard drinks   Drug use: No   Sexual activity: Not Currently  Other Topics Concern   Not on file  Social History Narrative   Not on file   Social Determinants of Health  Financial Resource Strain: Not on file  Food Insecurity: Not on file  Transportation Needs: Not on file  Physical Activity: Not on file  Stress: Not on file  Social Connections: Not on file     Family History: The patient's family history includes Cancer in her father and mother; Diabetes in her brother and mother; Heart disease in her mother; Hypertension in her brother, mother, and sister. There is no history of Colon cancer.  ROS:   Please see the history of present illness.    (+) Myalgia (+) Fatigue (+) Blurry vision (+) Rhinorrhea All other systems reviewed and negative.   EKGs/Labs/Other Studies Reviewed:    The following studies were reviewed today: Echo TEE 03/26/21 1. Left ventricular ejection fraction, by estimation, is 50 to 55%. The  left ventricle has low normal function. The left ventricle has no regional  wall motion abnormalities.   2. Right ventricular systolic function is normal. The right ventricular  size is moderately enlarged.   3. Left atrial size was mildly dilated. No left atrial/left atrial  appendage thrombus was detected.   4. Right atrial size was moderately dilated.   5. The mitral valve is normal in structure. Mild mitral valve  regurgitation.   6. The aortic  valve is normal in structure. Aortic valve regurgitation is  not visualized.   CTA Chest 03/25/21 1. Technically challenging examination and limited evaluation for pulmonary embolism. No evidence for a large or central pulmonary embolism. 2. Limited evaluation of both lungs due to significant motion artifact. Patchy lung densities in the lungs are suggestive for areas of atelectasis but nonspecific. No large areas of airspace disease or lung consolidation. 3. Evidence of old granulomatous disease. 4. **An incidental finding of potential clinical significance has been found. Indeterminate 2.3 cm left adrenal nodule. This is new or enlarged since 2010. Recommend further characterization with a dedicated adrenal CT. ** 5.  Aortic Atherosclerosis (ICD10-I70.0).  Lower Venous DVT 03/23/21 RIGHT:  - There is no evidence of deep vein thrombosis in the lower extremity.  However, portions of this examination were limited- see technologist  comments above.  LEFT:  - There is no evidence of deep vein thrombosis in the lower extremity.  However, portions of this examination were limited- see technologist  comments above.  - No cystic structure found in the popliteal fossa.   Echo 03/22/21  1. Left ventricular ejection fraction, by estimation, is 60 to 65%. The  left ventricle has normal function. The left ventricle has no regional  wall motion abnormalities. Left ventricular diastolic function could not  be evaluated.   2. The right ventricle is poorly visualized, but appears to be at least  mildly dilated and at least mildly depressed. Right ventricular systolic  function is mildly reduced. The right ventricular size is mildly enlarged.  Tricuspid regurgitation signal is   inadequate for assessing PA pressure.   3. The mitral valve is normal in structure. No evidence of mitral valve  regurgitation. No evidence of mitral stenosis.   4. The aortic valve is normal in structure. Aortic valve  regurgitation is  not visualized. No aortic stenosis is present.   5. The inferior vena cava is dilated in size with >50% respiratory  variability, suggesting right atrial pressure of 8 mmHg.   EKG:  EKG was not ordered today  Recent Labs: 03/22/2021: B Natriuretic Peptide 158.6 03/24/2021: ALT 17; TSH 3.494 03/26/2021: Hemoglobin 12.6; Platelets 130 03/27/2021: BUN 21; Creatinine, Ser 0.97; Magnesium 2.1; Potassium 3.8;  Sodium 138   Recent Lipid Panel    Component Value Date/Time   CHOL 126 03/27/2021 0241   CHOL 163 06/05/2019 1622   TRIG 103 03/27/2021 0241   HDL 27 (L) 03/27/2021 0241   HDL 39 (L) 06/05/2019 1622   CHOLHDL 4.7 03/27/2021 0241   VLDL 21 03/27/2021 0241   LDLCALC 78 03/27/2021 0241   LDLCALC 103 (H) 06/05/2019 1622    CHA2DS2-VASc Score = 3 [CHF History: 0, HTN History: 1, Diabetes History: 0, Stroke History: 0, Vascular Disease History: 0, Age Score: 1, Gender Score: 1].  Therefore, the patient's annual risk of stroke is 3.2 %.        Physical Exam:    BP (!) 105/55   Pulse (!) 55   Ht '5\' 6"'  (1.676 m)   Wt (!) 387 lb 3.2 oz (175.6 kg)   BMI 62.50 kg/m  GENERAL: Well-appearing.  No acute distress. HEENT: Pupils equal round.  Oral mucosa unremarkable NECK:  No jugular venous distention, no visible thyromegaly EXT:  No edema, no cyanosis no clubbing SKIN:  No rashes no nodules NEURO:  Speech fluent.  Cranial nerves grossly intact.  Moves all 4 extremities freely PSYCH:  Cognitively intact, oriented to person place and time  ASSESSMENT:    1. Hypothyroidism, unspecified type   2. Medication management   3. Essential hypertension, benign   4. PAF (paroxysmal atrial fibrillation) ([place])   5. Acute hypercapnic respiratory failure (HCC)    PLAN:    Essential hypertension, benign BP is controlled.  IT has been low.  Reduce metoprolol to 12.62m daily.  [place] She had an episode of atrial fibrillation with RVR. She  thinks that is in rhythm but bradycardic.  Reduce metoprolol to 12.514mdaily.  Continue amiodarone 20068maily and Eliquis.  Check CBC, CMP and TSH when her home health comes.  SHe is bed bound and cannot come for labs.   Acute hypercapnic respiratory failure (HCC) Continue supplemental oxygen.  She cannot get a sleep study but likely has OSA.  In order of problems listed above:       Medication Adjustments/Labs and Tests Ordered: Current medicines are reviewed at length with the patient today.  Concerns regarding medicines are outlined above.  Orders Placed This Encounter  Procedures   CBC   Comp Met (CMET)   TSH    Meds ordered this encounter  Medications   metoprolol succinate (TOPROL XL) 25 MG 24 hr tablet    Sig: Take 0.5 tablets (12.5 mg total) by mouth daily.    Dispense:  45 tablet    Refill:  3    Disposition: FU with Damani Rando C. RanOval LinseyD, [facility] 3-4 months  I,Mykaella Javier,acting as a scribe for TifSkeet LatchD.,have documented all relevant documentation on the behalf of TifSkeet LatchD,as directed by  TifSkeet LatchD while in the presence of TifSkeet LatchD.  I, TifGentryvillenOval LinseyD have reviewed all documentation for this visit.  The documentation of the exam, diagnosis, procedures, and orders on 08/11/2021 are all accurate and complete.   Signed, TifSkeet LatchD  08/11/2021 12:34 PM    [place]

## 2021-08-11 NOTE — Assessment & Plan Note (Signed)
She had an episode of atrial fibrillation with RVR. She thinks that is in rhythm but bradycardic.  Reduce metoprolol to 12.5mg  daily.  Continue amiodarone 200mg  daily and Eliquis.  Check CBC, CMP and TSH when her home health comes.  SHe is bed bound and cannot come for labs.

## 2021-10-07 ENCOUNTER — Other Ambulatory Visit (HOSPITAL_BASED_OUTPATIENT_CLINIC_OR_DEPARTMENT_OTHER): Payer: Self-pay

## 2021-10-07 DIAGNOSIS — J9611 Chronic respiratory failure with hypoxia: Secondary | ICD-10-CM

## 2022-05-20 ENCOUNTER — Telehealth: Payer: Self-pay | Admitting: Radiation Oncology

## 2022-05-20 NOTE — Telephone Encounter (Signed)
9/13 @ 3:04 pm Left voicemail on [phone], for patient to call our office to be schedule for consult with Dr. Isidore Moos.

## 2022-05-27 ENCOUNTER — Ambulatory Visit: Payer: Medicare HMO

## 2022-05-27 ENCOUNTER — Ambulatory Visit
Admission: RE | Admit: 2022-05-27 | Discharge: 2022-05-27 | Disposition: A | Discharge status: Home / Self Care | Payer: Medicare HMO | Source: Ambulatory Visit | Attending: Radiation Oncology | Admitting: Radiation Oncology

## 2022-06-05 NOTE — Progress Notes (Signed)
Histology and Location of Primary Skin Cancer:    [name] presented with the following signs/symptoms: skin lesion has been present for over a year. It is irregular, rough, raised, irritated, and made worse by picking.   Past/Anticipated interventions by patient's surgeon/dermatologist for current problematic lesion, if any:  05/19/2022 --Dr. Karin Golden     03/05/2022 --Dr. Karin Golden Shave biopsy of left upper lip lesion   Past skin cancers, if any: None   History of Blistering sunburns, if any: {:[id number]}   SAFETY ISSUES: Prior radiation? *** Pacemaker/ICD? *** Possible current pregnancy? No--hysterectomy Is the patient on methotrexate? ***   Current Complaints / other details:  ***

## 2022-06-08 NOTE — Progress Notes (Signed)
Radiation Oncology         (336) [phone] ________________________________  Initial Outpatient Consultation  Name: [name] MRN: [id number]  Date: 06/09/2022  DOB: 01-04-1956  CC:Sun, Mikeal Hawthorne, MD  Karin Golden, MD   REFERRING PHYSICIAN: Karin Golden, MD  DIAGNOSIS:    ICD-10-CM   1. Squamous cell carcinoma of lip  C44.02     2. Squamous cell skin cancer, face  C44.320       Superficially invasive squamous cell carcinoma of the left upper lip involving the deep margin   Cancer Staging  Squamous cell skin cancer, face Staging form: Cutaneous Carcinoma of the Head and Neck, AJCC 8th Edition - Clinical stage from 06/09/2022: Stage II (cT2, cN0, cM0) - Signed by Eppie Gibson, MD on 06/09/2022 Stage prefix: Initial diagnosis Extraosseous extension: Absent   CHIEF COMPLAINT: Here to discuss management of skin cancer  HISTORY OF PRESENT ILLNESS::[name] is a 66 y.o. female who presented to Dr. Pearline Cables at [facility] on 03/05/22 for evaluation of a left upper lip lesion that appeared 1 year prior. The patient detailed the lesion to be irregular, irritated, rough, and raised. She had been picking at it which contributed to its appearance.   Biopsy of the upper lip lesion collected that same date (06/29) revealed superficially invasive squamous cell carcinoma extending to the deep margins.   Accordingly, the patient was referred to Dr. Winifred Olive on 05/19/22 to discuss treatment options. Given the patient's obesity class, she was not felt to a surgical candidate. Subsequently, Dr. Winifred Olive referred the patient to me for consideration of radiation to the lesion.   She is here with her significant other today.  She requires special transportation, O2 per [place], wheelchair, and a hoya lift. She smokes and has had significantl difficulty with quitting.  Photo from Dr Winifred Olive pre bx:    PREVIOUS RADIATION THERAPY: No  PAST MEDICAL HISTORY:  has a past medical history of Anemia,  Arthritis, AVN (avascular necrosis of bone) ([place]) (04/14/2013), Chronic kidney disease, Complication of anesthesia, Decreased mobility, Depression, GERD (gastroesophageal reflux disease), Hep B w/o coma, Hep C w/o coma, chronic ([place]), Hypertension, Hypothyroid, Obesity, Osteoporosis, PAF (paroxysmal atrial fibrillation) ([place]) (03/22/2021), Pneumonia, PONV (postoperative nausea and vomiting), Shortness of breath dyspnea, Superficial thrombophlebitis, and Transfusion history.    PAST SURGICAL HISTORY: Past Surgical History:  Procedure Laterality Date   ABDOMINAL HYSTERECTOMY     APPENDECTOMY     thinks it was removed with hysterectomy   arthroscopies     knee-right x 7   CARDIOVERSION N/A 03/26/2021   Procedure: CARDIOVERSION;  Surgeon: Jerline Pain, MD;  Location: [facility];  Service: Cardiovascular;  Laterality: N/A;   CHOLECYSTECTOMY     COLON SURGERY     COLONOSCOPY     COLONOSCOPY WITH PROPOFOL N/A 02/26/2015   Procedure: COLONOSCOPY WITH PROPOFOL;  Surgeon: Inda Castle, MD;  Location: [facility];  Service: Endoscopy;  Laterality: N/A;   COLONOSCOPY WITH PROPOFOL N/A 03/03/2016   Procedure: COLONOSCOPY WITH PROPOFOL;  Surgeon: Mauri Pole, MD;  Location: [facility];  Service: Endoscopy;  Laterality: N/A;   ESOPHAGOGASTRODUODENOSCOPY (EGD) WITH PROPOFOL N/A 03/03/2016   Procedure: ESOPHAGOGASTRODUODENOSCOPY (EGD) WITH PROPOFOL;  Surgeon: Mauri Pole, MD;  Location: [facility];  Service: Endoscopy;  Laterality: N/A;   JOINT REPLACEMENT     right knee   TEE WITHOUT CARDIOVERSION N/A 03/26/2021   Procedure: TRANSESOPHAGEAL ECHOCARDIOGRAM (TEE);  Surgeon: Jerline Pain, MD;  Location: [facility]COPY;  Service: Cardiovascular;  Laterality: N/A;   TOTAL KNEE ARTHROPLASTY      FAMILY HISTORY: family history includes Cancer in her father and mother; Diabetes in her brother and mother; Heart disease in her mother; Hypertension in her brother, mother, and sister.  SOCIAL  HISTORY:  reports that she has been smoking cigarettes. She has a 10.00 pack-year smoking history. She has never used smokeless tobacco. She reports that she does not drink alcohol and does not use drugs.  ALLERGIES: Penicillins, Demerol, Meperidine, and Morphine and related  MEDICATIONS:  Current Outpatient Medications  Medication Sig Dispense Refill   metformin (FORTAMET) 1000 MG (OSM) 24 hr tablet Take 1 tablet by mouth daily.     acetaminophen (TYLENOL) 325 MG tablet Take 2 tablets (650 mg total) by mouth every 6 (six) hours as needed for mild pain (or Fever >/= 101).     amiodarone (PACERONE) 200 MG tablet Take one tablet twice daily until 04/08/21, then change to one tablet daily starting on 04/09/21. 60 tablet 0   apixaban (ELIQUIS) 5 MG TABS tablet Take 1 tablet (5 mg total) by mouth 2 (two) times daily. 60 tablet 0   atorvastatin (LIPITOR) 40 MG tablet Take 1 tablet (40 mg total) by mouth daily. 30 tablet 0   escitalopram (LEXAPRO) 10 MG tablet TAKE 1 TABLET BY MOUTH EVERY DAY. Need TO keep appointment in June FOR future refills 30 tablet 2   furosemide (LASIX) 40 MG tablet Take 1 tablet (40 mg total) by mouth daily. 30 tablet 0   levothyroxine (SYNTHROID) 88 MCG tablet Take 88 mcg by mouth daily.     metoprolol succinate (TOPROL XL) 25 MG 24 hr tablet Take 0.5 tablets (12.5 mg total) by mouth daily. 45 tablet 3   omeprazole (PRILOSEC) 40 MG capsule Take 1 capsule (40 mg total) by mouth daily as needed (acid reflux). 90 capsule 1   potassium chloride SA (KLOR-CON) 20 MEQ tablet Take 1 tablet (20 mEq total) by mouth daily. 30 tablet 0   No current facility-administered medications for this encounter.    REVIEW OF SYSTEMS:  Notable for that above.   PHYSICAL EXAM:  height is '5\' 7"'$  (1.702 m). Her temporal temperature is 96.4 F (35.8 C) (abnormal). Her blood pressure is 103/84 and her pulse is 62. Her respiration is 22 (abnormal) and oxygen saturation is 97%.   General: Alert and  oriented, in no acute distress   HEENT: left upper lip exophytic hyperpigmented growth with biopsy scar; no teeth in anterior maxilla Neck: Neck is supple, no palpable cervical, periparotid or supraclavicular lymphadenopathy. Lymphatics: see Neck Exam Skin: see HEENT Musculoskeletal: debilitated, uses WC. Neurologic:  Speech is fluent.   Psychiatric: Judgment and insight are intact. Affect is appropriate. Ext: significant LE edema  ECOG = 4  0 - Asymptomatic (Fully active, able to carry on all predisease activities without restriction)  1 - Symptomatic but completely ambulatory (Restricted in physically strenuous activity but ambulatory and able to carry out work of a light or sedentary nature. For example, light housework, office work)  2 - Symptomatic, <50% in bed during the day (Ambulatory and capable of all self care but unable to carry out any work activities. Up and about more than 50% of waking hours)  3 - Symptomatic, >50% in bed, but not bedbound (Capable of only limited self-care, confined to bed or chair 50% or more of waking hours)  4 - Bedbound (Completely disabled. Cannot carry on any self-care. Totally confined to bed or chair)  5 - Death   Eustace Pen MM, Creech RH, Tormey DC, et al. [phone]). "Toxicity and response criteria of the [facility]". [place] Oncol. 5 (6): 649-55   LABORATORY DATA:  Lab Results  Component Value Date   WBC 6.7 03/26/2021   HGB 12.6 03/26/2021   HCT 41.7 03/26/2021   MCV 93.3 03/26/2021   PLT 130 (L) 03/26/2021   CMP     Component Value Date/Time   NA 138 03/27/2021 0241   NA 140 06/05/2019 1622   K 3.8 03/27/2021 0241   CL 103 03/27/2021 0241   CO2 28 03/27/2021 0241   GLUCOSE 119 (H) 03/27/2021 0241   BUN 21 03/27/2021 0241   BUN 9 06/05/2019 1622   CREATININE 0.97 03/27/2021 0241   CREATININE 0.56 06/18/2016 1733   CALCIUM 8.8 (L) 03/27/2021 0241   PROT 7.2 03/24/2021 0316   PROT 7.6 06/05/2019 1622    ALBUMIN 3.1 (L) 03/24/2021 0316   ALBUMIN 4.0 06/05/2019 1622   AST 18 03/24/2021 0316   ALT 17 03/24/2021 0316   ALKPHOS 47 03/24/2021 0316   BILITOT 0.6 03/24/2021 0316   BILITOT 0.3 06/05/2019 1622   GFRNONAA >60 03/27/2021 0241   GFRNONAA >89 06/18/2016 1733   GFRAA 110 06/05/2019 1622   GFRAA >89 06/18/2016 1733        RADIOGRAPHY: No results found.    IMPRESSION/PLAN: Left upper lip skin cancer.  Not a good candidate for surgical resection per Dr. Winifred Olive due to comorbidites.  Today, I talked to the patient about the findings and work-up thus far.  We discussed the patient's diagnosis of skin cancer of upper lip and general treatment for this, highlighting the role of radiotherapy in the management.  We discussed the available radiation techniques, and focused on the details of logistics and delivery.     I recommend electron therapy twice/week for 5 weeks with curative intent.  We discussed the risks, benefits, and side effects of radiotherapy. Side effects may include but not necessarily be limited to: skin irritation, mucosal irritation of nose/ mouth, taste changes, cartilage injury, skin/soft tissue injury. No guarantees of treatment were given. A consent form was signed and placed in the patient's medical record. The patient was encouraged to ask questions that I answered to the best of my ability.     Will proceed with RT planning today and start RT next week.  On date of service, in total, I spent 50 minutes on this encounter. Patient was seen in person.   __________________________________________   Eppie Gibson, MD  This document serves as a record of services personally performed by Eppie Gibson, MD. It was created on her behalf by Roney Mans, a trained medical scribe. The creation of this record is based on the scribe's personal observations and the provider's statements to them. This document has been checked and approved by the attending provider.

## 2022-06-09 ENCOUNTER — Ambulatory Visit
Admission: RE | Admit: 2022-06-09 | Discharge: 2022-06-09 | Disposition: A | Discharge status: Home / Self Care | Payer: Medicare HMO | Source: Ambulatory Visit | Attending: Radiation Oncology | Admitting: Radiation Oncology

## 2022-06-09 ENCOUNTER — Encounter: Payer: Self-pay | Admitting: Radiation Oncology

## 2022-06-09 ENCOUNTER — Other Ambulatory Visit: Payer: Self-pay

## 2022-06-09 VITALS — BP 103/84 | HR 62 | Temp 96.4°F | Resp 22 | Ht 67.0 in

## 2022-06-09 DIAGNOSIS — Z7901 Long term (current) use of anticoagulants: Secondary | ICD-10-CM | POA: Diagnosis not present

## 2022-06-09 DIAGNOSIS — F17210 Nicotine dependence, cigarettes, uncomplicated: Secondary | ICD-10-CM | POA: Insufficient documentation

## 2022-06-09 DIAGNOSIS — C44320 Squamous cell carcinoma of skin of unspecified parts of face: Secondary | ICD-10-CM

## 2022-06-09 DIAGNOSIS — N189 Chronic kidney disease, unspecified: Secondary | ICD-10-CM | POA: Insufficient documentation

## 2022-06-09 DIAGNOSIS — E669 Obesity, unspecified: Secondary | ICD-10-CM | POA: Diagnosis not present

## 2022-06-09 DIAGNOSIS — Z510 Encounter for antineoplastic radiation therapy: Secondary | ICD-10-CM | POA: Diagnosis not present

## 2022-06-09 DIAGNOSIS — K219 Gastro-esophageal reflux disease without esophagitis: Secondary | ICD-10-CM | POA: Diagnosis not present

## 2022-06-09 DIAGNOSIS — Z7984 Long term (current) use of oral hypoglycemic drugs: Secondary | ICD-10-CM | POA: Diagnosis not present

## 2022-06-09 DIAGNOSIS — C4402 Squamous cell carcinoma of skin of lip: Secondary | ICD-10-CM | POA: Diagnosis present

## 2022-06-09 DIAGNOSIS — Z800 Family history of malignant neoplasm of digestive organs: Secondary | ICD-10-CM | POA: Diagnosis not present

## 2022-06-09 DIAGNOSIS — Z79899 Other long term (current) drug therapy: Secondary | ICD-10-CM | POA: Diagnosis not present

## 2022-06-11 ENCOUNTER — Other Ambulatory Visit (HOSPITAL_BASED_OUTPATIENT_CLINIC_OR_DEPARTMENT_OTHER): Payer: Self-pay | Admitting: Cardiovascular Disease

## 2022-06-11 NOTE — Telephone Encounter (Signed)
Rx(s) sent to pharmacy electronically.  

## 2022-06-15 DIAGNOSIS — Z510 Encounter for antineoplastic radiation therapy: Secondary | ICD-10-CM | POA: Diagnosis not present

## 2022-06-16 ENCOUNTER — Ambulatory Visit: Payer: Medicare HMO | Admitting: Radiation Oncology

## 2022-06-17 ENCOUNTER — Ambulatory Visit: Payer: Medicare HMO

## 2022-06-18 ENCOUNTER — Ambulatory Visit: Payer: Medicare HMO

## 2022-06-19 ENCOUNTER — Ambulatory Visit: Payer: Medicare HMO | Admitting: Radiation Oncology

## 2022-06-22 ENCOUNTER — Ambulatory Visit: Payer: Medicare HMO

## 2022-06-22 ENCOUNTER — Ambulatory Visit: Admission: RE | Admit: 2022-06-22 | Payer: Medicare HMO | Source: Ambulatory Visit | Admitting: Radiation Oncology

## 2022-06-22 ENCOUNTER — Ambulatory Visit
Admission: RE | Admit: 2022-06-22 | Discharge: 2022-06-22 | Disposition: A | Discharge status: Home / Self Care | Payer: Medicare HMO | Source: Ambulatory Visit | Attending: Radiation Oncology | Admitting: Radiation Oncology

## 2022-06-22 ENCOUNTER — Other Ambulatory Visit: Payer: Self-pay

## 2022-06-22 DIAGNOSIS — Z510 Encounter for antineoplastic radiation therapy: Secondary | ICD-10-CM | POA: Diagnosis not present

## 2022-06-23 ENCOUNTER — Ambulatory Visit: Payer: Medicare HMO

## 2022-06-24 ENCOUNTER — Ambulatory Visit: Payer: Medicare HMO

## 2022-06-25 ENCOUNTER — Ambulatory Visit: Payer: Medicare HMO

## 2022-06-26 ENCOUNTER — Ambulatory Visit
Admission: RE | Admit: 2022-06-26 | Discharge: 2022-06-26 | Disposition: A | Discharge status: Home / Self Care | Payer: Medicare HMO | Source: Ambulatory Visit | Attending: Radiation Oncology | Admitting: Radiation Oncology

## 2022-06-26 ENCOUNTER — Ambulatory Visit: Payer: Medicare HMO

## 2022-06-26 ENCOUNTER — Other Ambulatory Visit: Payer: Self-pay

## 2022-06-26 DIAGNOSIS — Z510 Encounter for antineoplastic radiation therapy: Secondary | ICD-10-CM | POA: Diagnosis not present

## 2022-06-26 LAB — RAD ONC ARIA SESSION SUMMARY
Course Elapsed Days: 0
Plan Fractions Treated to Date: 1
Plan Prescribed Dose Per Fraction: 4.4 Gy
Plan Total Fractions Prescribed: 10
Plan Total Prescribed Dose: 44 Gy
Reference Point Dosage Given to Date: 4.4 Gy
Reference Point Session Dosage Given: 4.4 Gy
Session Number: 1

## 2022-06-29 ENCOUNTER — Ambulatory Visit
Admission: RE | Admit: 2022-06-29 | Discharge: 2022-06-29 | Disposition: A | Discharge status: Home / Self Care | Payer: Medicare HMO | Source: Ambulatory Visit | Attending: Radiation Oncology | Admitting: Radiation Oncology

## 2022-06-29 ENCOUNTER — Other Ambulatory Visit: Payer: Self-pay

## 2022-06-29 ENCOUNTER — Ambulatory Visit: Payer: Medicare HMO

## 2022-06-29 DIAGNOSIS — Z510 Encounter for antineoplastic radiation therapy: Secondary | ICD-10-CM | POA: Diagnosis not present

## 2022-06-29 LAB — RAD ONC ARIA SESSION SUMMARY
Course Elapsed Days: 3
Plan Fractions Treated to Date: 2
Plan Prescribed Dose Per Fraction: 4.4 Gy
Plan Total Fractions Prescribed: 10
Plan Total Prescribed Dose: 44 Gy
Reference Point Dosage Given to Date: 8.8 Gy
Reference Point Session Dosage Given: 4.4 Gy
Session Number: 2

## 2022-06-30 ENCOUNTER — Ambulatory Visit: Payer: Medicare HMO

## 2022-07-02 ENCOUNTER — Ambulatory Visit: Payer: Medicare HMO

## 2022-07-03 ENCOUNTER — Ambulatory Visit: Payer: Medicare HMO

## 2022-07-06 ENCOUNTER — Ambulatory Visit
Admission: RE | Admit: 2022-07-06 | Discharge: 2022-07-06 | Disposition: A | Discharge status: Home / Self Care | Payer: Medicare HMO | Source: Ambulatory Visit | Attending: Radiation Oncology | Admitting: Radiation Oncology

## 2022-07-06 ENCOUNTER — Other Ambulatory Visit: Payer: Self-pay

## 2022-07-06 DIAGNOSIS — Z510 Encounter for antineoplastic radiation therapy: Secondary | ICD-10-CM | POA: Diagnosis not present

## 2022-07-06 DIAGNOSIS — C44320 Squamous cell carcinoma of skin of unspecified parts of face: Secondary | ICD-10-CM

## 2022-07-06 LAB — RAD ONC ARIA SESSION SUMMARY
Course Elapsed Days: 10
Plan Fractions Treated to Date: 1
Plan Prescribed Dose Per Fraction: 7 Gy
Plan Total Fractions Prescribed: 4
Plan Total Prescribed Dose: 28 Gy
Reference Point Dosage Given to Date: 15.8 Gy
Reference Point Session Dosage Given: 7 Gy
Session Number: 3

## 2022-07-06 MED ORDER — SONAFINE EX EMUL
1.0000 | Freq: Two times a day (BID) | CUTANEOUS | Status: DC
  Administered 2022-07-06: 1 via TOPICAL

## 2022-07-07 ENCOUNTER — Ambulatory Visit: Payer: Medicare HMO

## 2022-07-09 ENCOUNTER — Ambulatory Visit: Payer: Medicare HMO

## 2022-07-10 ENCOUNTER — Ambulatory Visit
Admission: RE | Admit: 2022-07-10 | Discharge: 2022-07-10 | Disposition: A | Discharge status: Home / Self Care | Payer: Medicare HMO | Source: Ambulatory Visit | Attending: Radiation Oncology | Admitting: Radiation Oncology

## 2022-07-10 ENCOUNTER — Other Ambulatory Visit: Payer: Self-pay

## 2022-07-10 DIAGNOSIS — C4402 Squamous cell carcinoma of skin of lip: Secondary | ICD-10-CM | POA: Insufficient documentation

## 2022-07-10 DIAGNOSIS — Z510 Encounter for antineoplastic radiation therapy: Secondary | ICD-10-CM | POA: Insufficient documentation

## 2022-07-10 LAB — RAD ONC ARIA SESSION SUMMARY
Course Elapsed Days: 14
Plan Fractions Treated to Date: 2
Plan Prescribed Dose Per Fraction: 7 Gy
Plan Total Fractions Prescribed: 4
Plan Total Prescribed Dose: 28 Gy
Reference Point Dosage Given to Date: 22.8 Gy
Reference Point Session Dosage Given: 7 Gy
Session Number: 4

## 2022-07-13 ENCOUNTER — Other Ambulatory Visit: Payer: Self-pay

## 2022-07-13 ENCOUNTER — Ambulatory Visit
Admission: RE | Admit: 2022-07-13 | Discharge: 2022-07-13 | Disposition: A | Discharge status: Home / Self Care | Payer: Medicare HMO | Source: Ambulatory Visit | Attending: Radiation Oncology | Admitting: Radiation Oncology

## 2022-07-13 ENCOUNTER — Ambulatory Visit: Payer: Medicare HMO

## 2022-07-13 DIAGNOSIS — Z510 Encounter for antineoplastic radiation therapy: Secondary | ICD-10-CM | POA: Diagnosis not present

## 2022-07-13 LAB — RAD ONC ARIA SESSION SUMMARY
Course Elapsed Days: 17
Plan Fractions Treated to Date: 3
Plan Prescribed Dose Per Fraction: 7 Gy
Plan Total Fractions Prescribed: 4
Plan Total Prescribed Dose: 28 Gy
Reference Point Dosage Given to Date: 29.8 Gy
Reference Point Session Dosage Given: 7 Gy
Session Number: 5

## 2022-07-14 ENCOUNTER — Ambulatory Visit: Payer: Medicare HMO

## 2022-07-17 ENCOUNTER — Ambulatory Visit: Payer: Medicare HMO

## 2022-07-20 ENCOUNTER — Ambulatory Visit: Payer: Medicare HMO

## 2022-07-20 ENCOUNTER — Telehealth: Payer: Self-pay

## 2022-07-20 NOTE — Telephone Encounter (Signed)
Ms. Costanza called and left a message on voicemail for RN that she had a sore on her lip from radiation. Pictures were provided for Dr.Squire to view. Viscous Lidocaine called in to pt pharmacy. L2 called and her treatment will be rescheduled for Friday at 12:30. Pt understood the instructions and was appreciative of the assistance.

## 2022-07-20 NOTE — Telephone Encounter (Signed)
Rn Anderson Malta called in pt rx  of 2% viscous lidocaine to [facility] without complication. They do have a supply of 100cc of product and will call her when more comes in tomorrow.

## 2022-07-21 ENCOUNTER — Ambulatory Visit: Payer: Medicare HMO

## 2022-07-24 ENCOUNTER — Ambulatory Visit: Payer: Medicare HMO

## 2022-07-24 ENCOUNTER — Other Ambulatory Visit: Payer: Self-pay

## 2022-07-24 ENCOUNTER — Encounter: Payer: Self-pay | Admitting: Radiation Oncology

## 2022-07-24 ENCOUNTER — Ambulatory Visit
Admission: RE | Admit: 2022-07-24 | Discharge: 2022-07-24 | Disposition: A | Discharge status: Home / Self Care | Payer: Medicare HMO | Source: Ambulatory Visit | Attending: Radiation Oncology | Admitting: Radiation Oncology

## 2022-07-24 DIAGNOSIS — Z510 Encounter for antineoplastic radiation therapy: Secondary | ICD-10-CM | POA: Diagnosis not present

## 2022-07-24 LAB — RAD ONC ARIA SESSION SUMMARY
Course Elapsed Days: 28
Plan Fractions Treated to Date: 4
Plan Prescribed Dose Per Fraction: 7 Gy
Plan Total Fractions Prescribed: 4
Plan Total Prescribed Dose: 28 Gy
Reference Point Dosage Given to Date: 36.8 Gy
Reference Point Session Dosage Given: 7 Gy
Session Number: 6

## 2022-07-27 ENCOUNTER — Ambulatory Visit: Payer: Medicare HMO

## 2022-07-31 ENCOUNTER — Ambulatory Visit: Payer: Medicare HMO

## 2022-08-11 NOTE — Progress Notes (Signed)
[name] is her for follow up for completion of lip cancer radiation treatment on 07-24-22.   Pain issues, if any: right leg and bilat hip pain Using a feeding tube?: no Weight changes, if any: none to report Swallowing issues, if any: swallowing much better Smoking or chewing tobacco? Yes, still smokes Using fluoride trays daily? no Last ENT visit was on: no Has not seen dermatology since diagnosis. Wants at recommendation for good dermatologist.  Other notable issues, if any: no concerns today, just wants to know about wound care. Lip looks much better overall.   Vitals:   08/18/22 1452  BP: (!) 120/59  Pulse: 66  Resp: (!) 28  Temp: 97.6 F (36.4 C)  SpO2: 91%

## 2022-08-18 ENCOUNTER — Encounter: Payer: Self-pay | Admitting: Radiation Oncology

## 2022-08-18 ENCOUNTER — Ambulatory Visit
Admission: RE | Admit: 2022-08-18 | Discharge: 2022-08-18 | Disposition: A | Discharge status: Home / Self Care | Payer: Medicare HMO | Source: Ambulatory Visit | Attending: Radiation Oncology | Admitting: Radiation Oncology

## 2022-08-18 VITALS — BP 120/59 | HR 66 | Temp 97.6°F | Resp 28 | Ht 67.0 in

## 2022-08-18 DIAGNOSIS — Z79890 Hormone replacement therapy (postmenopausal): Secondary | ICD-10-CM | POA: Diagnosis not present

## 2022-08-18 DIAGNOSIS — C4402 Squamous cell carcinoma of skin of lip: Secondary | ICD-10-CM | POA: Diagnosis present

## 2022-08-18 DIAGNOSIS — C44320 Squamous cell carcinoma of skin of unspecified parts of face: Secondary | ICD-10-CM

## 2022-08-18 DIAGNOSIS — Z7984 Long term (current) use of oral hypoglycemic drugs: Secondary | ICD-10-CM | POA: Insufficient documentation

## 2022-08-18 DIAGNOSIS — Z7901 Long term (current) use of anticoagulants: Secondary | ICD-10-CM | POA: Insufficient documentation

## 2022-08-18 DIAGNOSIS — Z923 Personal history of irradiation: Secondary | ICD-10-CM | POA: Insufficient documentation

## 2022-08-18 DIAGNOSIS — Z79899 Other long term (current) drug therapy: Secondary | ICD-10-CM | POA: Insufficient documentation

## 2022-08-18 NOTE — Progress Notes (Signed)
Radiation Oncology         (336) [phone] ________________________________  Name: [name] MRN: [id number]  Date: 08/18/2022  DOB: [birth year]  Follow-Up Visit Note  CC: Clovia Cuff, MD  Karin Golden, MD  Diagnosis and Prior Radiotherapy:       ICD-10-CM   1. Squamous cell carcinoma of lip  C44.02 Ambulatory referral to Dermatology    2. Squamous cell skin cancer, face  C44.320 Ambulatory referral to Dermatology      CHIEF COMPLAINT:  Here for follow-up and surveillance of squamous cell carcinoma of the upper lip  Narrative:  The patient returns today for routine follow-up after completion radiation treatment to the upper lip on 07/24/22.                      Pain issues, if any: right leg and bilateral hip pain Using a feeding tube?: no Weight changes, if any: none to report Swallowing issues, if any: swallowing much better Smoking or chewing tobacco? Yes, still smokes Using fluoride trays daily? no Last ENT visit was on: no Has not seen dermatology since diagnosis. Wants a recommendation for good dermatologist.  Other notable issues, if any: no concerns today, just wants to know about wound care.  ALLERGIES:  is allergic to penicillins, demerol, meperidine, and morphine and related.  Meds: Current Outpatient Medications  Medication Sig Dispense Refill   acetaminophen (TYLENOL) 325 MG tablet Take 2 tablets (650 mg total) by mouth every 6 (six) hours as needed for mild pain (or Fever >/= 101).     amiodarone (PACERONE) 200 MG tablet Take one tablet twice daily until 04/08/21, then change to one tablet daily starting on 04/09/21. 60 tablet 0   escitalopram (LEXAPRO) 10 MG tablet TAKE 1 TABLET BY MOUTH EVERY DAY. Need TO keep appointment in June FOR future refills 30 tablet 2   levothyroxine (SYNTHROID) 88 MCG tablet Take 88 mcg by mouth daily.     metformin (FORTAMET) 1000 MG (OSM) 24 hr tablet Take 1 tablet by mouth daily.     metoprolol succinate (TOPROL-XL) 25 MG 24  hr tablet TAKE 1/2 TABLET BY MOUTH DAILY 45 tablet 0   omeprazole (PRILOSEC) 40 MG capsule Take 1 capsule (40 mg total) by mouth daily as needed (acid reflux). 90 capsule 1   apixaban (ELIQUIS) 5 MG TABS tablet Take 1 tablet (5 mg total) by mouth 2 (two) times daily. 60 tablet 0   atorvastatin (LIPITOR) 40 MG tablet Take 1 tablet (40 mg total) by mouth daily. 30 tablet 0   furosemide (LASIX) 40 MG tablet Take 1 tablet (40 mg total) by mouth daily. 30 tablet 0   potassium chloride SA (KLOR-CON) 20 MEQ tablet Take 1 tablet (20 mEq total) by mouth daily. 30 tablet 0   No current facility-administered medications for this encounter.    Physical Findings: The patient is in no acute distress. Patient is alert and oriented. Wt Readings from Last 3 Encounters:  08/11/21 (!) 387 lb 3.2 oz (175.6 kg)  03/27/21 (!) 379 lb 3.1 oz (172 kg)  03/17/19 (!) 331 lb (150.1 kg)    height is '5\' 7"'$  (1.702 m). Her temperature is 97.6 F (36.4 C). Her blood pressure is 120/59 (abnormal) and her pulse is 66. Her respiration is 28 (abnormal) and oxygen saturation is 91%. .  General: Alert and oriented, in no acute distress HEENT: Head is normocephalic. Extraocular movements are intact. Oropharynx is notable for a small patch of  resolving mucositis in the mucosa of her upper lip close to the midline that looks to be almost completely healed.  Skin: Skin in treatment fields shows satisfactory healing. Hypopigmentation on the left upper lip where the cancer has fallen off. Tenderness to palpation.  Multiple dark and raised lesions of uncertain etiology Ext: LE edema Psychiatric: Judgment and insight are intact. Affect is appropriate.  TODAY'S PHOTO:   Lab Findings: Lab Results  Component Value Date   WBC 6.7 03/26/2021   HGB 12.6 03/26/2021   HCT 41.7 03/26/2021   MCV 93.3 03/26/2021   PLT 130 (L) 03/26/2021    Lab Results  Component Value Date   TSH 3.494 03/24/2021    Radiographic Findings: No  results found.  Impression/Plan:    Patient is healing very well from radiation treatment.  Lesion over left upper lip has resolved nicely with no evidence of gross disease remaining  She would like a referral to a dermatologist. Will refer back to Dr. Hubbard Hartshorn office to help monitor and address painful/bothersome skin lesions as appropriate.   I will see her back as needed.  She is pleased with this plan.  On date of service, in total, I spent 30 minutes on this encounter. Patient was seen in person. _____________________________________   Leona Singleton, PA    Eppie Gibson, MD

## 2022-08-19 NOTE — Progress Notes (Signed)
                                                                                                                                                             Patient Name: [name] MRN: [id number] DOB: 09/20/1955 Referring Physician: Karin Golden Date of Service: 07/24/2022 [facility], [place]                                                        End Of Treatment Note  Diagnoses: C44.02-Squamous cell carcinoma of skin of lip  Cancer Staging:  Cancer Staging  Squamous cell skin cancer, face Staging form: Cutaneous Carcinoma of the Head and Neck, AJCC 8th Edition - Clinical stage from 06/09/2022: Stage II (cT2, cN0, cM0) - Signed by Eppie Gibson, MD on 06/09/2022 Stage prefix: Initial diagnosis Extraosseous extension: Absent  Intent: Curative  Radiation Treatment Dates: 06/26/2022 through 07/24/2022 Site Technique Total Dose (Gy) Dose per Fx (Gy) Completed Fx Beam Energies  Lip, Upper: HN_up_lip specialPort 8.8/8.8 4.4 2/2 6E, 9E  Lip, Upper: HN_up_lip2 specialPort 28/28 7 4/4 6E, 9E   Narrative: The patient tolerated radiation therapy relatively well. Her fractionation was adjusted to reduce her number of visits, given patient's difficulty coming to clinic in the initial weeks of treatment.    Plan: The patient will follow-up with radiation oncology in 3 weeks . -----------------------------------  Eppie Gibson, MD

## 2022-08-25 ENCOUNTER — Telehealth: Payer: Self-pay | Admitting: *Deleted

## 2022-08-25 NOTE — Telephone Encounter (Signed)
Called patient to ask question, lvm for a return call 

## 2022-09-11 ENCOUNTER — Other Ambulatory Visit (HOSPITAL_BASED_OUTPATIENT_CLINIC_OR_DEPARTMENT_OTHER): Payer: Self-pay | Admitting: Cardiovascular Disease

## 2022-09-11 NOTE — Telephone Encounter (Signed)
Patient was contacted and is scheduled for a My Chart visit on Thursday 09/17/22 at 2:45 pm with Laurann Montana, NP

## 2022-09-11 NOTE — Telephone Encounter (Signed)
Rx request sent to pharmacy.  

## 2022-09-11 NOTE — Telephone Encounter (Signed)
Please call pt to schedule overdue f/u appointment wiuth Dr. Oval Linsey or APP for refills. Thank you!

## 2022-09-17 ENCOUNTER — Encounter (HOSPITAL_BASED_OUTPATIENT_CLINIC_OR_DEPARTMENT_OTHER): Payer: Self-pay | Admitting: Family

## 2022-09-17 ENCOUNTER — Telehealth (INDEPENDENT_AMBULATORY_CARE_PROVIDER_SITE_OTHER): Payer: Medicare HMO | Admitting: Family

## 2022-09-17 VITALS — BP 90/53 | HR 61 | Ht 67.0 in

## 2022-09-17 DIAGNOSIS — Z79899 Other long term (current) drug therapy: Secondary | ICD-10-CM | POA: Diagnosis not present

## 2022-09-17 DIAGNOSIS — D6859 Other primary thrombophilia: Secondary | ICD-10-CM

## 2022-09-17 DIAGNOSIS — I480 Paroxysmal atrial fibrillation: Secondary | ICD-10-CM

## 2022-09-17 DIAGNOSIS — I10 Essential (primary) hypertension: Secondary | ICD-10-CM | POA: Diagnosis not present

## 2022-09-17 NOTE — Progress Notes (Signed)
Virtual Visit via Telephone Note   Because of [name]'s co-morbid illnesses, she is at least at moderate risk for complications without adequate follow up.  This format is felt to be most appropriate for this patient at this time.  The patient did not have access to video technology/had technical difficulties with video requiring transitioning to audio format only (telephone).  All issues noted in this document were discussed and addressed.  No physical exam could be performed with this format.  Please refer to the patient's chart for her consent to telehealth for [facility].   Date:  09/17/2022   ID:  [name], DOB [birth year], MRN [id number] The patient was identified using 2 identifiers.  Patient Location: Home Provider Location: Office/Clinic   PCP:  Clovia Cuff, MD   [place] [facility] Cardiologist:  Skeet Latch, MD     Evaluation Performed:  Follow-Up Visit  Chief Complaint:  Hypertension  History of Present Illness:    [name] is a 67 y.o. female with HTN, GERD, CKD, anemia, obesity. Last seen via video 08/11/21 by Dr. Haynes Dage.   Admitted 03/2021 hyperbaric respiratory failure and new onset atrial fibrillation. She was essentially bed bound but TEE/DCCV performed prior to discharge. Unable to have sleep study and discharged on home oxygen. Discharge regimen included Amidarone, Eliquis, Metoprolol.  At last visit Metoprolol reduced due to hypotension.   Presents today via phone. She is having some problems with her blood pressure. Notes she is getting hypotensive readings 110s/60s but NP who comes to her home gets 130s/80s. Her home cuff has not been checked for accuracy and encouraged her to ask NP to check at next visit. Reports feeling generalized weakness, fatigue. She did recently have radiation treatments for cancer to her face. She is eating two meals per day. She drinks 2-3 cups of caffeinated coffee, one or two 64 oz cups  of water with crushed ice, one 12 oz bottles pepsi.   Reports no shortness of breath and stable dyspnea on exertion. Reports no chest pain, pressure, or tightness. No edema, orthopnea, PND.   Past Medical History:  Diagnosis Date   Anemia    Arthritis    AVN (avascular necrosis of bone) ([place]) 04/14/2013   Chronic kidney disease    Complication of anesthesia    Decreased mobility    hx. Rt. hip "avascular necrosis" -unable to weight bear long periods, "using wheelchair"..   Depression    GERD (gastroesophageal reflux disease)    Hep B w/o coma    Hep C w/o coma, chronic ([place])    07-20-14 being presently tx. "Harvoni"-good response per pt. will complete in 7days- Dr. Linus Salmons follows [place] [facility].   Hypertension    Hypothyroid    mild- no medication   Obesity    Osteoporosis    PAF (paroxysmal atrial fibrillation) ([place]) 03/22/2021   Pneumonia    PONV (postoperative nausea and vomiting)    only with Demerol   Shortness of breath dyspnea    still smokes   Superficial thrombophlebitis    left leg   Transfusion history    70's, 80's ? hepatitis C attributed to past transfusions   Past Surgical History:  Procedure Laterality Date   ABDOMINAL HYSTERECTOMY     APPENDECTOMY     thinks it was removed with hysterectomy   arthroscopies     knee-right x 7   CARDIOVERSION N/A 03/26/2021   Procedure: CARDIOVERSION;  Surgeon: Marlou Porch,  Thana Farr, MD;  Location: [facility];  Service: Cardiovascular;  Laterality: N/A;   CHOLECYSTECTOMY     COLON SURGERY     COLONOSCOPY     COLONOSCOPY WITH PROPOFOL N/A 02/26/2015   Procedure: COLONOSCOPY WITH PROPOFOL;  Surgeon: Inda Castle, MD;  Location: [facility] ENDOSCOPY;  Service: Endoscopy;  Laterality: N/A;   COLONOSCOPY WITH PROPOFOL N/A 03/03/2016   Procedure: COLONOSCOPY WITH PROPOFOL;  Surgeon: Mauri Pole, MD;  Location: [facility] END[facility]COPY;  Service: Endoscopy;  Laterality: N/A;   ESOPHAGOGASTRODUODENOSCOPY (EGD) WITH  PROPOFOL N/A 03/03/2016   Procedure: ESOPHAGOGASTRODUODENOSCOPY (EGD) WITH PROPOFOL;  Surgeon: Mauri Pole, MD;  Location: [facility] END[facility]COPY;  Service: Endoscopy;  Laterality: N/A;   JOINT REPLACEMENT     right knee   TEE WITHOUT CARDIOVERSION N/A 03/26/2021   Procedure: TRANSESOPHAGEAL ECHOCARDIOGRAM (TEE);  Surgeon: Jerline Pain, MD;  Location: [facility] ENDOSCOPY;  Service: Cardiovascular;  Laterality: N/A;   TOTAL KNEE ARTHROPLASTY       Current Meds  Medication Sig   acetaminophen (TYLENOL) 325 MG tablet Take 2 tablets (650 mg total) by mouth every 6 (six) hours as needed for mild pain (or Fever >/= 101).   amiodarone (PACERONE) 200 MG tablet Take one tablet twice daily until 04/08/21, then change to one tablet daily starting on 04/09/21.   escitalopram (LEXAPRO) 10 MG tablet TAKE 1 TABLET BY MOUTH EVERY DAY. Need TO keep appointment in June FOR future refills   levothyroxine (SYNTHROID) 88 MCG tablet Take 88 mcg by mouth daily.   metFORMIN (GLUCOPHAGE-XR) 500 MG 24 hr tablet Take 500 mg by mouth 2 (two) times daily with a meal.   metoprolol succinate (TOPROL-XL) 25 MG 24 hr tablet TAKE 1/2 TABLET BY MOUTH EVERY DAY   omeprazole (PRILOSEC) 40 MG capsule Take 1 capsule (40 mg total) by mouth daily as needed (acid reflux).     Allergies:   Penicillins, Demerol, Meperidine, and Morphine and related   Social History   Tobacco Use   Smoking status: Every Day    Packs/day: 0.50    Years: 20.00    Total pack years: 10.00    Types: Cigarettes   Smokeless tobacco: Never   Tobacco comments:    trying to cut back  Substance Use Topics   Alcohol use: No    Alcohol/week: 0.0 standard drinks of alcohol   Drug use: No     Family Hx: The patient's family history includes Cancer in her father and mother; Diabetes in her brother and mother; Heart disease in her mother; Hypertension in her brother, mother, and sister. There is no history of Colon cancer.  ROS:   Please see the history of  present illness.     All other systems reviewed and are negative.   Prior CV studies:   The following studies were reviewed today:  Echo TEE 03/26/21 1. Left ventricular ejection fraction, by estimation, is 50 to 55%. The  left ventricle has low normal function. The left ventricle has no regional  wall motion abnormalities.   2. Right ventricular systolic function is normal. The right ventricular  size is moderately enlarged.   3. Left atrial size was mildly dilated. No left atrial/left atrial  appendage thrombus was detected.   4. Right atrial size was moderately dilated.   5. The mitral valve is normal in structure. Mild mitral valve  regurgitation.   6. The aortic valve is normal in structure. Aortic valve regurgitation is  not visualized.    CTA Chest  03/25/21 1. Technically challenging examination and limited evaluation for pulmonary embolism. No evidence for a large or central pulmonary embolism. 2. Limited evaluation of both lungs due to significant motion artifact. Patchy lung densities in the lungs are suggestive for areas of atelectasis but nonspecific. No large areas of airspace disease or lung consolidation. 3. Evidence of old granulomatous disease. 4. **An incidental finding of potential clinical significance has been found. Indeterminate 2.3 cm left adrenal nodule. This is new or enlarged since 2010. Recommend further characterization with a dedicated adrenal CT. ** 5.  Aortic Atherosclerosis (ICD10-I70.0).   Lower Venous DVT 03/23/21 RIGHT:  - There is no evidence of deep vein thrombosis in the lower extremity.  However, portions of this examination were limited- see technologist  comments above.  LEFT:  - There is no evidence of deep vein thrombosis in the lower extremity.  However, portions of this examination were limited- see technologist  comments above.  - No cystic structure found in the popliteal fossa.    Echo 03/22/21  1. Left ventricular ejection  fraction, by estimation, is 60 to 65%. The  left ventricle has normal function. The left ventricle has no regional  wall motion abnormalities. Left ventricular diastolic function could not  be evaluated.   2. The right ventricle is poorly visualized, but appears to be at least  mildly dilated and at least mildly depressed. Right ventricular systolic  function is mildly reduced. The right ventricular size is mildly enlarged.  Tricuspid regurgitation signal is   inadequate for assessing PA pressure.   3. The mitral valve is normal in structure. No evidence of mitral valve  regurgitation. No evidence of mitral stenosis.   4. The aortic valve is normal in structure. Aortic valve regurgitation is  not visualized. No aortic stenosis is present.   5. The inferior vena cava is dilated in size with >50% respiratory  variability, suggesting right atrial pressure of 8 mmHg.   Labs/Other Tests and Data Reviewed:    EKG:  No ECG reviewed.  Recent Labs: No results found for requested labs within last 365 days.   Recent Lipid Panel Lab Results  Component Value Date/Time   CHOL 126 03/27/2021 02:41 AM   CHOL 163 06/05/2019 04:22 PM   TRIG 103 03/27/2021 02:41 AM   HDL 27 (L) 03/27/2021 02:41 AM   HDL 39 (L) 06/05/2019 04:22 PM   CHOLHDL 4.7 03/27/2021 02:41 AM   LDLCALC 78 03/27/2021 02:41 AM   LDLCALC 103 (H) 06/05/2019 04:22 PM    Wt Readings from Last 3 Encounters:  08/11/21 (!) 387 lb 3.2 oz (175.6 kg)  03/27/21 (!) 379 lb 3.1 oz (172 kg)  03/17/19 (!) 331 lb (150.1 kg)     Risk Assessment/Calculations:    CHA2DS2-VASc Score = 3   This indicates a 3.2% annual risk of stroke. The patient's score is based upon: CHF History: 0 HTN History: 1 Diabetes History: 0 Stroke History: 0 Vascular Disease History: 0 Age Score: 1 Gender Score: 1         Objective:    Vital Signs:  BP (!) 90/53   Pulse 61   Ht '5\' 7"'$  (1.702 m)   BMI 60.64 kg/m    VITAL SIGNS:   reviewed  ASSESSMENT & PLAN:    HTN - Labile per her report. She will ask her NP who does home visits to check for accuracy at next visit. If BP <110/60, plan to stop Metoprolol.   PAF - No recent palpitations.  Continue Metoprolol, Amiodarone, Eliquis. CHA2DS2-VASc Score = 3 [CHF History: 0, HTN History: 1, Diabetes History: 0, Stroke History: 0, Vascular Disease History: 0, Age Score: 1, Gender Score: 1].  Therefore, the patient's annual risk of stroke is 3.2 %.    . Amiodarone monitoring: will ask PCP to collect CMP, TSH, CBC for monitoring.   Hypercapnic respiratory failure - No recent exacerbation. Managed by PCP. On home O2 at night.       Time:   Today, I have spent 12 minutes with the patient with telehealth technology discussing the above problems.    Medication Adjustments/Labs and Tests Ordered: Current medicines are reviewed at length with the patient today.  Concerns regarding medicines are outlined above.   Tests Ordered: No orders of the defined types were placed in this encounter.   Medication Changes: No orders of the defined types were placed in this encounter.   Follow Up:  Virtual Visit  in 6 month(s)  Signed, Loel Dubonnet, NP  09/17/2022 3:12 PM    [place]

## 2022-09-17 NOTE — Patient Instructions (Addendum)
Medication Instructions:  Continue your current medications.   If your blood pressure is consistently less than 110/60 we will consider stopping your Metoprolol.  *If you need a refill on your cardiac medications before your next appointment, please call your pharmacy*   Lab Work: Your physician recommends that you return for lab work the next time your primary care comes to your home for CMP, thyroid panel, CBC. I have faxed copies of these labs to PCP and enclosed them with this letter.  Please fax results to [facility] at [phone]  If you have labs (blood work) drawn today and your tests are completely normal, you will receive your results only by: [place] (if you have MyChart) OR A paper copy in the mail If you have any lab test that is abnormal or we need to change your treatment, we will call you to review the results.  Follow-Up: At [facility], you and your health needs are our priority.  As part of our continuing mission to provide you with exceptional heart care, we have created designated Provider Care Teams.  These Care Teams include your primary Cardiologist (physician) and Advanced Practice Providers (APPs -  Physician Assistants and Nurse Practitioners) who all work together to provide you with the care you need, when you need it.  We recommend signing up for the patient portal called "MyChart".  Sign up information is provided on this After Visit Summary.  MyChart is used to connect with patients for Virtual Visits (Telemedicine).  Patients are able to view lab/test results, encounter notes, upcoming appointments, etc.  Non-urgent messages can be sent to your provider as well.   To learn more about what you can do with MyChart, go to [email or web address].    Your next appointment:   6 month(s) in person or virtual  Provider:   Skeet Latch, MD    Other Instructions  If your blood pressure is low you can raise it by: Eating something Drinking  something caffeine free

## 2022-09-19 ENCOUNTER — Encounter (HOSPITAL_BASED_OUTPATIENT_CLINIC_OR_DEPARTMENT_OTHER): Payer: Self-pay | Admitting: Family

## 2022-10-23 ENCOUNTER — Telehealth (HOSPITAL_BASED_OUTPATIENT_CLINIC_OR_DEPARTMENT_OTHER): Payer: Self-pay | Admitting: Family

## 2022-10-23 DIAGNOSIS — Z79899 Other long term (current) drug therapy: Secondary | ICD-10-CM

## 2022-10-23 DIAGNOSIS — D6859 Other primary thrombophilia: Secondary | ICD-10-CM

## 2022-10-23 NOTE — Addendum Note (Signed)
Addended by: Gerald Stabs on: 10/23/2022 04:33 PM   Modules accepted: Orders

## 2022-10-23 NOTE — Telephone Encounter (Signed)
Called patient at request of NP, labs ordered and mailed to patient, she will schedule follow up with PCP and does not note any blood in urine or stool.    Will ask the nurse to contact the patient with the following recommendations:  Thyroid panel, CBC in 2 weeks. We can order or request from PCP.  Prompt follow up with primary care regarding anemia. If preferred, we can refer to GI.  Notify cardiology team if noting any blood in urine or stool (bright red or dark black stool).

## 2022-10-23 NOTE — Telephone Encounter (Signed)
Labs received from primary care provider dated 09/28/2022. Creatinine 0.8, GFR 80.71, NA 135, K4.5, AST 17, ALT 10 Total cholesterol 123, HDL 38.7, LDL 65.1, triglycerides 96 TSH 5.36 Hemoglobin 9.0, hematocrit 32.5, platelet count 187  Kidneys, liver were normal.   Given mildly elevated TSH she does need full thyroid panel done as she is on Amiodarone. She can have this done in 2 weeks with repeat CBC.   She is anemia and her Hb has dropped (09/2021 Hb 11.8 ? 09/2022 Hb 9.0). Recommend prompt follow up with primary care for further workup. If she prefers, we can refer her to GI. Will need repeat CBC in 2-4 weeks for monitoring.   Will ask the nurse to contact the patient with the following recommendations: Thyroid panel, CBC in 2 weeks. We can order or request from PCP. Prompt follow up with primary care regarding anemia. If preferred, we can refer to GI. Notify cardiology team if noting any blood in urine or stool (bright red or dark black stool).   Phone note has also been routed via [employer] function to PCP  Loel Dubonnet, NP

## 2022-10-26 ENCOUNTER — Encounter (HOSPITAL_BASED_OUTPATIENT_CLINIC_OR_DEPARTMENT_OTHER): Payer: Self-pay

## 2022-11-30 ENCOUNTER — Telehealth: Payer: Self-pay | Admitting: Cardiovascular Disease

## 2022-11-30 DIAGNOSIS — D6859 Other primary thrombophilia: Secondary | ICD-10-CM

## 2022-11-30 DIAGNOSIS — Z79899 Other long term (current) drug therapy: Secondary | ICD-10-CM

## 2022-11-30 DIAGNOSIS — R710 Precipitous drop in hematocrit: Secondary | ICD-10-CM

## 2022-11-30 NOTE — Telephone Encounter (Signed)
Patient would like to know if her lab results have been received. She says she had them drawn in her home and they were supposed to have been sent to the office.

## 2022-11-30 NOTE — Telephone Encounter (Signed)
Returned call to patient to let her know we haven't not yet received her lab results. [name] MHA RN CCM

## 2022-12-01 ENCOUNTER — Other Ambulatory Visit (HOSPITAL_BASED_OUTPATIENT_CLINIC_OR_DEPARTMENT_OTHER): Payer: Self-pay | Admitting: Cardiovascular Disease

## 2022-12-01 NOTE — Telephone Encounter (Signed)
Rx(s) sent to pharmacy electronically.  

## 2022-12-04 ENCOUNTER — Encounter (HOSPITAL_BASED_OUTPATIENT_CLINIC_OR_DEPARTMENT_OTHER): Payer: Self-pay

## 2022-12-04 NOTE — Telephone Encounter (Signed)
Returned call to patient to review the following information,   Of note patient was not wearing oxygen at the time of the call and had to stop to put it on throughout the call. Patient states that she has had some lightheadedness and dizziness. She states the she has not noted any blood in her stool. She is currently taking something for congestion and states someone is going to be doing repeat labs for her and she states if her blood work comes back bad again she will get checked out.  Advised patient that she really needed to go today to get checked out, due to the fact that bleeding could be internal. She states she would work on arranging transport. Updated NP, she would like [place] to go ahead and put in the GI referral in case she does not go to the ED.     "Labs now normal kidneys and liver. Cholesterol panel at goal. Thyroid function shows elevated TSH and T3. Subclinical hypothyroidism. No indication for treatment at this time. Recommend PCP repeat thyroid panel in 6 months for monitoring.    Anemia worsened. Prior labs 09/2022 with Hb 9 now 11/17/22 Hb 7.8. Likely that she is bleeding somewhere.    Has she received instruction from primary care regarding this?   If she is experiencing low blood pressures, lightheadedness, dizziness, or noting blood in urine or stool (bright red stool or dark tarry stool) will need evaluation in ED.   If not experiencing symptoms, urgent referral to GI.    Loel Dubonnet, NP"

## 2022-12-04 NOTE — Telephone Encounter (Signed)
I see that you reviewed her labs under the media tab, did we need to make any changes?

## 2022-12-04 NOTE — Telephone Encounter (Signed)
Labs now normal kidneys and liver. Cholesterol panel at goal. Thyroid function shows elevated TSH and T3. Subclinical hypothyroidism. No indication for treatment at this time. Recommend PCP repeat thyroid panel in 6 months for monitoring.   Anemia worsened. Prior labs 09/2022 with Hb 9 now 11/17/22 Hb 7.8. Likely that she is bleeding somewhere.   Has she received instruction from primary care regarding this?  If she is experiencing low blood pressures, lightheadedness, dizziness, or noting blood in urine or stool (bright red stool or dark tarry stool) will need evaluation in ED.  If not experiencing symptoms, urgent referral to GI.   Loel Dubonnet, NP

## 2022-12-04 NOTE — Telephone Encounter (Signed)
Patient is following up. She would like to know if her thyroid results have been received. Please advise.

## 2022-12-05 ENCOUNTER — Emergency Department (HOSPITAL_COMMUNITY): Payer: Medicare HMO

## 2022-12-05 ENCOUNTER — Encounter (HOSPITAL_COMMUNITY): Payer: Self-pay

## 2022-12-05 ENCOUNTER — Other Ambulatory Visit: Payer: Self-pay

## 2022-12-05 ENCOUNTER — Observation Stay (HOSPITAL_COMMUNITY)
Admission: EM | Admit: 2022-12-05 | Discharge: 2022-12-06 | Disposition: A | Discharge status: Home / Self Care | Payer: Medicare HMO | Attending: Internal Medicine | Admitting: Internal Medicine

## 2022-12-05 DIAGNOSIS — K7460 Unspecified cirrhosis of liver: Secondary | ICD-10-CM | POA: Diagnosis present

## 2022-12-05 DIAGNOSIS — F418 Other specified anxiety disorders: Secondary | ICD-10-CM | POA: Diagnosis present

## 2022-12-05 DIAGNOSIS — Z7984 Long term (current) use of oral hypoglycemic drugs: Secondary | ICD-10-CM | POA: Diagnosis not present

## 2022-12-05 DIAGNOSIS — Z7901 Long term (current) use of anticoagulants: Secondary | ICD-10-CM | POA: Insufficient documentation

## 2022-12-05 DIAGNOSIS — I5033 Acute on chronic diastolic (congestive) heart failure: Secondary | ICD-10-CM | POA: Diagnosis not present

## 2022-12-05 DIAGNOSIS — J9611 Chronic respiratory failure with hypoxia: Secondary | ICD-10-CM | POA: Insufficient documentation

## 2022-12-05 DIAGNOSIS — I480 Paroxysmal atrial fibrillation: Secondary | ICD-10-CM | POA: Diagnosis present

## 2022-12-05 DIAGNOSIS — I509 Heart failure, unspecified: Secondary | ICD-10-CM

## 2022-12-05 DIAGNOSIS — Z85828 Personal history of other malignant neoplasm of skin: Secondary | ICD-10-CM | POA: Insufficient documentation

## 2022-12-05 DIAGNOSIS — N189 Chronic kidney disease, unspecified: Secondary | ICD-10-CM | POA: Diagnosis not present

## 2022-12-05 DIAGNOSIS — I130 Hypertensive heart and chronic kidney disease with heart failure and stage 1 through stage 4 chronic kidney disease, or unspecified chronic kidney disease: Secondary | ICD-10-CM | POA: Diagnosis not present

## 2022-12-05 DIAGNOSIS — D509 Iron deficiency anemia, unspecified: Secondary | ICD-10-CM

## 2022-12-05 DIAGNOSIS — R109 Unspecified abdominal pain: Secondary | ICD-10-CM | POA: Insufficient documentation

## 2022-12-05 DIAGNOSIS — I10 Essential (primary) hypertension: Secondary | ICD-10-CM | POA: Diagnosis present

## 2022-12-05 DIAGNOSIS — F17210 Nicotine dependence, cigarettes, uncomplicated: Secondary | ICD-10-CM | POA: Insufficient documentation

## 2022-12-05 DIAGNOSIS — Z96653 Presence of artificial knee joint, bilateral: Secondary | ICD-10-CM | POA: Diagnosis not present

## 2022-12-05 DIAGNOSIS — D649 Anemia, unspecified: Principal | ICD-10-CM | POA: Insufficient documentation

## 2022-12-05 DIAGNOSIS — E039 Hypothyroidism, unspecified: Secondary | ICD-10-CM | POA: Diagnosis not present

## 2022-12-05 DIAGNOSIS — R531 Weakness: Secondary | ICD-10-CM | POA: Diagnosis present

## 2022-12-05 DIAGNOSIS — E1122 Type 2 diabetes mellitus with diabetic chronic kidney disease: Secondary | ICD-10-CM | POA: Diagnosis not present

## 2022-12-05 DIAGNOSIS — Z79899 Other long term (current) drug therapy: Secondary | ICD-10-CM | POA: Insufficient documentation

## 2022-12-05 LAB — CBC WITH DIFFERENTIAL/PLATELET
Abs Immature Granulocytes: 0.02 10*3/uL (ref 0.00–0.07)
Basophils Absolute: 0 10*3/uL (ref 0.0–0.1)
Basophils Relative: 0 %
Eosinophils Absolute: 0 10*3/uL (ref 0.0–0.5)
Eosinophils Relative: 0 %
HCT: 27.4 % — ABNORMAL LOW (ref 36.0–46.0)
Hemoglobin: 7.4 g/dL — ABNORMAL LOW (ref 12.0–15.0)
Immature Granulocytes: 0 %
Lymphocytes Relative: 18 %
Lymphs Abs: 1.1 10*3/uL (ref 0.7–4.0)
MCH: 22.1 pg — ABNORMAL LOW (ref 26.0–34.0)
MCHC: 27 g/dL — ABNORMAL LOW (ref 30.0–36.0)
MCV: 81.8 fL (ref 80.0–100.0)
Monocytes Absolute: 0.4 10*3/uL (ref 0.1–1.0)
Monocytes Relative: 7 %
Neutro Abs: 4.5 10*3/uL (ref 1.7–7.7)
Neutrophils Relative %: 75 %
Platelets: 157 10*3/uL (ref 150–400)
RBC: 3.35 MIL/uL — ABNORMAL LOW (ref 3.87–5.11)
RDW: 18.2 % — ABNORMAL HIGH (ref 11.5–15.5)
WBC: 6 10*3/uL (ref 4.0–10.5)
nRBC: 0 % (ref 0.0–0.2)

## 2022-12-05 LAB — URINALYSIS, W/ REFLEX TO CULTURE (INFECTION SUSPECTED)
Bilirubin Urine: NEGATIVE
Glucose, UA: NEGATIVE mg/dL
Ketones, ur: NEGATIVE mg/dL
Leukocytes,Ua: NEGATIVE
Nitrite: NEGATIVE
Protein, ur: NEGATIVE mg/dL
Specific Gravity, Urine: 1.004 — ABNORMAL LOW (ref 1.005–1.030)
pH: 7 (ref 5.0–8.0)

## 2022-12-05 LAB — COMPREHENSIVE METABOLIC PANEL
ALT: 9 U/L (ref 0–44)
AST: 15 U/L (ref 15–41)
Albumin: 3.3 g/dL — ABNORMAL LOW (ref 3.5–5.0)
Alkaline Phosphatase: 41 U/L (ref 38–126)
Anion gap: 8 (ref 5–15)
BUN: 14 mg/dL (ref 8–23)
CO2: 35 mmol/L — ABNORMAL HIGH (ref 22–32)
Calcium: 8.3 mg/dL — ABNORMAL LOW (ref 8.9–10.3)
Chloride: 90 mmol/L — ABNORMAL LOW (ref 98–111)
Creatinine, Ser: 0.85 mg/dL (ref 0.44–1.00)
GFR, Estimated: >60 mL/min (ref >60)
Glucose, Bld: 100 mg/dL — ABNORMAL HIGH (ref 70–99)
Potassium: 4.1 mmol/L (ref 3.5–5.1)
Sodium: 133 mmol/L — ABNORMAL LOW (ref 135–145)
Total Bilirubin: 0.6 mg/dL (ref 0.3–1.2)
Total Protein: 7.9 g/dL (ref 6.5–8.1)

## 2022-12-05 LAB — TROPONIN I (HIGH SENSITIVITY)
Troponin I (High Sensitivity): 6 ng/L (ref <18)
Troponin I (High Sensitivity): 6 ng/L (ref <18)

## 2022-12-05 LAB — BRAIN NATRIURETIC PEPTIDE: B Natriuretic Peptide: 226.3 pg/mL — ABNORMAL HIGH (ref 0.0–100.0)

## 2022-12-05 LAB — IRON AND TIBC
Iron: 21 ug/dL — ABNORMAL LOW (ref 28–170)
Saturation Ratios: 5 % — ABNORMAL LOW (ref 10.4–31.8)
TIBC: 444 ug/dL (ref 250–450)
UIBC: 423 ug/dL

## 2022-12-05 LAB — POC OCCULT BLOOD, ED: Fecal Occult Bld: NEGATIVE

## 2022-12-05 LAB — FOLATE: Folate: 8.6 ng/mL (ref >5.9)

## 2022-12-05 LAB — RETICULOCYTES
Immature Retic Fract: 28.4 % — ABNORMAL HIGH (ref 2.3–15.9)
RBC.: 3.15 MIL/uL — ABNORMAL LOW (ref 3.87–5.11)
Retic Count, Absolute: 94.5 10*3/uL (ref 19.0–186.0)
Retic Ct Pct: 3 % (ref 0.4–3.1)

## 2022-12-05 LAB — VITAMIN B12: Vitamin B-12: 367 pg/mL (ref 180–914)

## 2022-12-05 LAB — LIPASE, BLOOD: Lipase: 22 U/L (ref 11–51)

## 2022-12-05 LAB — PROTIME-INR
INR: 1.5 — ABNORMAL HIGH (ref 0.8–1.2)
Prothrombin Time: 18.2 seconds — ABNORMAL HIGH (ref 11.4–15.2)

## 2022-12-05 LAB — FERRITIN: Ferritin: 11 ng/mL (ref 11–307)

## 2022-12-05 MED ORDER — OXYCODONE HCL 5 MG PO TABS
5.0000 mg | ORAL_TABLET | Freq: Once | ORAL | Status: AC
  Administered 2022-12-05: 5 mg via ORAL
  Filled 2022-12-05: qty 1

## 2022-12-05 MED ORDER — SODIUM CHLORIDE (PF) 0.9 % IJ SOLN
INTRAMUSCULAR | Status: AC
  Filled 2022-12-05: qty 50

## 2022-12-05 MED ORDER — IOHEXOL 300 MG/ML  SOLN
100.0000 mL | Freq: Once | INTRAMUSCULAR | Status: AC | PRN
  Administered 2022-12-05: 100 mL via INTRAVENOUS

## 2022-12-05 MED ORDER — FUROSEMIDE 10 MG/ML IJ SOLN
40.0000 mg | Freq: Once | INTRAMUSCULAR | Status: AC
  Administered 2022-12-05: 40 mg via INTRAVENOUS
  Filled 2022-12-05: qty 4

## 2022-12-05 MED ORDER — SODIUM CHLORIDE 0.9% IV SOLUTION: Freq: Once | INTRAVENOUS | Status: AC

## 2022-12-05 NOTE — ED Notes (Signed)
Patient stated that she thought she needed to have a BM. Assisted patient out of bed to use bedside commode, but commode not wide enough. Assisted patient back into bed. Pt became short of breath with exertion, but was steady on her feet. Pt able to regulate breathing after sitting for 1-2 minutes in bed. Patient repositioned and HOB elevated. Purewick catheter placed for incontinence/frequent urination due to lasix use and generalized weakness.

## 2022-12-05 NOTE — ED Triage Notes (Signed)
Patient coming from home via EMS with c/o weakness, central abdominal pain, and shortness of breath all increasing over the last 3 months. Pt also reports that her provider called her yesterday and told her that her platelets were low and to go to the emergency department.

## 2022-12-05 NOTE — ED Provider Notes (Signed)
[place] EMERGENCY DEPARTMENT AT [facility] Provider Note   CSN: [id number] Arrival date & time: 12/05/22  1705     History  Chief Complaint  Patient presents with   Weakness   Abdominal Pain   Shortness of Breath    [name] is a 67 y.o. female.  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  History is initially from EMS who reports stable vitals and stable oxygenation on her home oxygen.  However she tells me that she has been developing worsening weakness since December.  She had radiation ended in November and she felt sick from that but never felt this degree of weakness.  She was called by her doctor yesterday due to low platelet count per the patient (though chart review indicates this is probably hemoglobin) and that she was having "internal bleeding".  The patient states that she has been having abdominal pain for the last 3 weeks or so but a lot worse over the last couple days.  It is generalized.  She has been having shortness of breath that is progressively worsening and feeling edematous, especially in her legs.  No chest pain or headaches.  No back pain.  She is chronically wheelchair-bound, can stand to pivot to the toilet but otherwise has been using a wheelchair for many years.  She denies any blood in the stool or black stool.  Home Medications Prior to Admission medications   Medication Sig Start Date End Date Taking? Authorizing Provider  acetaminophen (TYLENOL) 325 MG tablet Take 2 tablets (650 mg total) by mouth every 6 (six) hours as needed for mild pain (or Fever >/= 101). 03/27/21   Arrien, Jimmy Picket, MD  amiodarone (PACERONE) 200 MG tablet Take one tablet twice daily until 04/08/21, then change to one tablet daily  starting on 04/09/21. 03/27/21   Arrien, Jimmy Picket, MD  apixaban (ELIQUIS) 5 MG TABS tablet Take 1 tablet (5 mg total) by mouth 2 (two) times daily. 03/27/21 08/11/21  Arrien, Jimmy Picket, MD  atorvastatin (LIPITOR) 40 MG tablet Take 1 tablet (40 mg total) by mouth daily. 03/28/21 08/11/21  Arrien, Jimmy Picket, MD  escitalopram (LEXAPRO) 10 MG tablet TAKE 1 TABLET BY MOUTH EVERY DAY. Need TO keep appointment in June FOR future refills 12/13/19   Kerin Perna, NP  furosemide (LASIX) 40 MG tablet Take 1 tablet (40 mg total) by mouth daily. 03/27/21 08/11/21  Arrien, Jimmy Picket, MD  levothyroxine (SYNTHROID) 88 MCG tablet Take 88 mcg by mouth daily. 06/08/22   [provider]  metFORMIN (GLUCOPHAGE-XR) 500 MG 24 hr tablet Take 500 mg by mouth 2 (two) times daily with a meal. 09/11/22   [provider]  metoprolol succinate (TOPROL-XL) 25 MG 24 hr tablet TAKE 1/2 TABLET BY MOUTH EVERY DAY 12/01/22   Skeet Latch, MD  omeprazole (PRILOSEC) 40 MG capsule Take 1 capsule (40 mg total) by mouth daily as needed (acid reflux). 10/16/19   Kerin Perna, NP  potassium chloride SA (KLOR-CON) 20 MEQ tablet Take 1 tablet (20 mEq total) by mouth daily. 03/27/21 08/11/21  Arrien, Jimmy Picket, MD      Allergies    Penicillins, Demerol, Meperidine, and Morphine and related    Review of Systems   Review of Systems  Constitutional:  Positive for fatigue.  Respiratory:  Positive  for shortness of breath.   Cardiovascular:  Positive for leg swelling. Negative for chest pain.  Gastrointestinal:  Positive for abdominal pain.  Neurological:  Positive for weakness. Negative for headaches.    Physical Exam Updated Vital Signs BP (!) 105/47   Pulse 92   Temp 98.2 F (36.8 C) (Oral)   Resp (!) 28   Ht 5\' 7"  (1.702 m)   Wt (!) 175.5 kg   SpO2 98%   BMI 60.61 kg/m  Physical Exam Vitals and nursing note reviewed.  Constitutional:      General: She is not in acute  distress.    Appearance: She is well-developed. She is obese. She is not ill-appearing.  HENT:     Head: Normocephalic and atraumatic.  Cardiovascular:     Rate and Rhythm: Normal rate and regular rhythm.     Heart sounds: Normal heart sounds.  Pulmonary:     Effort: Pulmonary effort is normal.     Breath sounds: Decreased breath sounds and wheezing (mild, diffuse) present.  Abdominal:     Palpations: Abdomen is soft.     Tenderness: There is generalized abdominal tenderness.  Musculoskeletal:     Right lower leg: Edema present.     Left lower leg: Edema present.  Skin:    General: Skin is warm and dry.  Neurological:     Mental Status: She is alert.     Comments: Patient has equal/normal strength in BUE. Neither leg can be lifted off the stretcher, which is baseline per patient.     ED Results / Procedures / Treatments   Labs (all labs ordered are listed, but only abnormal results are displayed) Labs Reviewed  URINALYSIS, W/ REFLEX TO CULTURE (INFECTION SUSPECTED) - Abnormal; Notable for the following components:      Result Value   Color, Urine STRAW (*)    Specific Gravity, Urine 1.004 (*)    Hgb urine dipstick SMALL (*)    Bacteria, UA FEW (*)    All other components within normal limits  COMPREHENSIVE METABOLIC PANEL - Abnormal; Notable for the following components:   Sodium 133 (*)    Chloride 90 (*)    CO2 35 (*)    Glucose, Bld 100 (*)    Calcium 8.3 (*)    Albumin 3.3 (*)    All other components within normal limits  CBC WITH DIFFERENTIAL/PLATELET - Abnormal; Notable for the following components:   RBC 3.35 (*)    Hemoglobin 7.4 (*)    HCT 27.4 (*)    MCH 22.1 (*)    MCHC 27.0 (*)    RDW 18.2 (*)    All other components within normal limits  BRAIN NATRIURETIC PEPTIDE - Abnormal; Notable for the following components:   B Natriuretic Peptide 226.3 (*)    All other components within normal limits  PROTIME-INR - Abnormal; Notable for the following  components:   Prothrombin Time 18.2 (*)    INR 1.5 (*)    All other components within normal limits  IRON AND TIBC - Abnormal; Notable for the following components:   Iron 21 (*)    Saturation Ratios 5 (*)    All other components within normal limits  RETICULOCYTES - Abnormal; Notable for the following components:   RBC. 3.15 (*)    Immature Retic Fract 28.4 (*)    All other components within normal limits  LIPASE, BLOOD  VITAMIN B12  FOLATE  FERRITIN  POC OCCULT BLOOD, ED  TYPE AND SCREEN  PREPARE RBC (CROSSMATCH)  TROPONIN I (HIGH SENSITIVITY)  TROPONIN I (HIGH SENSITIVITY)    EKG EKG Interpretation  Date/Time:  Saturday December 05 2022 17:26:15 EDT Ventricular Rate:  84 PR Interval:    QRS Duration: 105 QT Interval:  409 QTC Calculation: 484 R Axis:   39 Text Interpretation: Atrial fibrillation Low voltage, precordial leads Nonspecific T abnormalities, lateral leads Confirmed by Sherwood Gambler [phone]) on 12/05/2022 8:56:08 PM  Radiology CT ABDOMEN PELVIS W CONTRAST  Result Date: 12/05/2022 CLINICAL DATA:  Weakness, central abdominal pain, thrombocytopenia EXAM: CT ABDOMEN AND PELVIS WITH CONTRAST TECHNIQUE: Multidetector CT imaging of the abdomen and pelvis was performed using the standard protocol following bolus administration of intravenous contrast. RADIATION DOSE REDUCTION: This exam was performed according to the departmental dose-optimization program which includes automated exposure control, adjustment of the mA and/or kV according to patient size and/or use of iterative reconstruction technique. CONTRAST:  170mL OMNIPAQUE IOHEXOL 300 MG/ML  SOLN COMPARISON:  03/27/2021 FINDINGS: Evaluation is limited by patient body habitus. Lower chest: Trace bilateral pleural effusions and dependent consolidation, favoring atelectasis. Hepatobiliary: Cholecystectomy. Nodular contour of the liver consistent with cirrhosis. No focal liver abnormality. Portions of the superior right  lobe liver are excluded by slice selection. Pancreas: Unremarkable. No pancreatic ductal dilatation or surrounding inflammatory changes. Spleen: Normal in size without focal abnormality. Adrenals/Urinary Tract: Stable 1.7 cm left adrenal nodule with attenuation of 34 HU, favor adenoma. Right adrenals unremarkable. No acute renal findings. No urinary tract calculi or obstruction. The bladder is unremarkable. Stomach/Bowel: No bowel obstruction or ileus. Scattered diverticulosis of the sigmoid colon without diverticulitis. The appendix is surgically absent. No bowel wall thickening or inflammatory change. Vascular/Lymphatic: Aortic atherosclerosis. No enlarged abdominal or pelvic lymph nodes. Reproductive: Status post hysterectomy. No adnexal masses. Other: No free fluid or free intraperitoneal gas. No abdominal wall hernia. Musculoskeletal: Stable severe degenerative changes of the right hip with bony remodeling of the femoral head and acetabulum. No acute displaced fractures. Reconstructed images demonstrate no additional findings. IMPRESSION: 1. Limited study due to patient body habitus. 2. Trace bilateral pleural effusions, with dependent lower lobe consolidation favor atelectasis. 3. Sigmoid diverticulosis without diverticulitis. 4. Nodular contour of the liver consistent with cirrhosis. 5. Stable 1.7 cm left adrenal nodule favoring benign adenoma. 6.  Aortic Atherosclerosis (ICD10-I70.0). Electronically Signed   By: Randa Ngo M.D.   On: 12/05/2022 22:52   DG Chest Portable 1 View  Result Date: 12/05/2022 CLINICAL DATA:  Dyspnea.  Edema. EXAM: PORTABLE CHEST 1 VIEW COMPARISON:  Radiograph 03/22/2021 and 10/06/2012.  CT 03/25/2021. FINDINGS: 1710 hours. Stable cardiomegaly, aortic atherosclerosis and central enlargement of the pulmonary arteries. Stable chronic interstitial opacities within both lungs. No definite edema, confluent airspace opacity, pleural effusion or pneumothorax. The bones appear  unchanged, without acute findings. Telemetry leads overlie the chest. IMPRESSION: 1. Stable cardiomegaly and chronic interstitial lung disease. 2. No acute cardiopulmonary process. Electronically Signed   By: Richardean Sale M.D.   On: 12/05/2022 17:56    Procedures .Critical Care  Performed by: Sherwood Gambler, MD Authorized by: Sherwood Gambler, MD   Critical care provider statement:    Critical care time (minutes):  35   Critical care time was exclusive of:  Separately billable procedures and treating other patients   Critical care was necessary to treat or prevent imminent or life-threatening deterioration of the following conditions:  Cardiac failure and circulatory failure   Critical care was time spent personally by me on the following activities:  Development  of treatment plan with patient or surrogate, discussions with consultants, evaluation of patient's response to treatment, examination of patient, ordering and review of laboratory studies, ordering and review of radiographic studies, ordering and performing treatments and interventions, pulse oximetry, re-evaluation of patient's condition and review of old charts     Medications Ordered in ED Medications  sodium chloride (PF) 0.9 % injection (has no administration in time range)  0.9 %  sodium chloride infusion (Manually program via Guardrails IV Fluids) (has no administration in time range)  furosemide (LASIX) injection 40 mg (has no administration in time range)  oxyCODONE (Oxy IR/ROXICODONE) immediate release tablet 5 mg (has no administration in time range)  iohexol (OMNIPAQUE) 300 MG/ML solution 100 mL (100 mLs Intravenous Contrast Given 12/05/22 2227)    ED Course/ Medical Decision Making/ A&P                             Medical Decision Making Amount and/or Complexity of Data Reviewed External Data Reviewed: notes. Labs: ordered.    Details: Hemoglobin 7.4, has been seemingly downtrending for a few weeks.  No AKI.   BNP is elevated in the 200s. Radiology: ordered and independent interpretation performed.    Details: CT without obvious abdominal emergency though does seem to have some pleural effusions. ECG/medicine tests: ordered and independent interpretation performed.    Details: A-fib  Risk Prescription drug management. Decision regarding hospitalization.   No clear cause for her abdominal pain.  Overall probably does have some CHF contributing to her symptoms including swelling and shortness of breath.  I think she will need diuresis but she also seems to have symptomatic anemia.  No evidence of GI bleeding.  Her hemoglobin is over 7 though just barely and I think with the downtrend that she has had as an outpatient she will need transfusion.  This could also be contributing to some congestive heart failure.  She also was found to have cirrhosis on CT which seems to be a new diagnosis.  Overall due to all of this she will need admission.  Discussed with Dr. Posey Pronto.        Final Clinical Impression(s) / ED Diagnoses Final diagnoses:  Symptomatic anemia  Acute congestive heart failure, unspecified heart failure type (HCC)  Cirrhosis of liver without ascites, unspecified hepatic cirrhosis type [facility])    Rx / DC Orders ED Discharge Orders     None         Sherwood Gambler, MD 12/05/22 2325

## 2022-12-05 NOTE — ED Notes (Signed)
Pt's significant other, [name], leaving hospital. States to call him at number in chart with any significant updates or information.

## 2022-12-05 NOTE — H&P (Signed)
History and Physical    [name] [id number] DOB: 1955-10-13 DOA: 12/05/2022  PCP: Clovia Cuff, MD  Patient coming from: Home  I have personally briefly reviewed patient's old medical records in [place]  Chief Complaint: Fatigue, anemia  HPI: [name] is a 67 y.o. female with medical history significant for PAF on Eliquis, HFpEF, OHS, chronic hypoxic respiratory failure on 4 L Ramireno, T2DM, HTN, hypothyroidism, hepatic cirrhosis, squamous cell cancer of the face s/p radiation, chronic wheelchair-bound status who presented to the ED for evaluation of anemia.  ***  ED Course  Labs/Imaging on admission: I have personally reviewed following labs and imaging studies.  Initial vitals showed BP 122/55, pulse 91, RR 27, temp 98.2 F, SpO2 97% on room air.  Labs show WBC 6.0, hemoglobin 7.4 (previous 12.6 03/26/2021), platelets 157,000, BNP 226.3, troponin negative x 2, lipase 22, sodium 133, potassium 4.1, bicarb 35, BUN 14, creatinine 0.85, serum glucose 100, LFTs within normal limits.  UA not convincing for UTI.  FOBT negative.  Portable chest x-ray shows stable cardiomegaly, chronic interstitial opacities, no focal consolidation, effusion.  CT abdomen/pelvis with contrast shows trace bilateral pleural effusions with dependent lower lobe atelectasis, sigmoid diverticulosis without diverticulitis, nodular contour of the liver consistent with cirrhosis.  Stable 1.7 cm left adrenal nodule favoring benign adenoma noted.  Patient was given IV Lasix 40 mg and ordered to receive 1 unit PRBC transfusion.  The hospitalist service was consulted to admit for further evaluation and management.  Review of Systems: All systems reviewed and are negative except as documented in history of present illness above.   Past Medical History:  Diagnosis Date   Anemia    Arthritis    AVN (avascular necrosis of bone) ([place]) 04/14/2013   Chronic kidney disease    Complication of anesthesia     Decreased mobility    hx. Rt. hip "avascular necrosis" -unable to weight bear long periods, "using wheelchair"..   Depression    GERD (gastroesophageal reflux disease)    Hep B w/o coma    Hep C w/o coma, chronic ([place])    07-20-14 being presently tx. "[facility]"-good response per pt. will complete in 7days- Dr. Linus Salmons follows [place] [facility].   Hypertension    Hypothyroid    mild- no medication   Obesity    Osteoporosis    PAF (paroxysmal atrial fibrillation) ([place]) 03/22/2021   Pneumonia    PONV (postoperative nausea and vomiting)    only with Demerol   Shortness of breath dyspnea    still smokes   Superficial thrombophlebitis    left leg   Transfusion history    70's, 80's ? hepatitis C attributed to past transfusions    Past Surgical History:  Procedure Laterality Date   ABDOMINAL HYSTERECTOMY     APPENDECTOMY     thinks it was removed with hysterectomy   arthroscopies     knee-right x 7   CARDIOVERSION N/A 03/26/2021   Procedure: CARDIOVERSION;  Surgeon: Jerline Pain, MD;  Location: [place];  Service: Cardiovascular;  Laterality: N/A;   CHOLECYSTECTOMY     COLON SURGERY     COLONOSCOPY     COLONOSCOPY WITH PROPOFOL N/A 02/26/2015   Procedure: COLONOSCOPY WITH PROPOFOL;  Surgeon: Inda Castle, MD;  Location: [facility] ENDOSCOPY;  Service: Endoscopy;  Laterality: N/A;   COLONOSCOPY WITH PROPOFOL N/A 03/03/2016   Procedure: COLONOSCOPY WITH PROPOFOL;  Surgeon: Mauri Pole, MD;  Location: [facility];  Service: Endoscopy;  Laterality: N/A;   ESOPHAGOGASTRODUODENOSCOPY (EGD) WITH PROPOFOL N/A 03/03/2016   Procedure: ESOPHAGOGASTRODUODENOSCOPY (EGD) WITH PROPOFOL;  Surgeon: Mauri Pole, MD;  Location: [facility] ENDOSCOPY;  Service: Endoscopy;  Laterality: N/A;   JOINT REPLACEMENT     right knee   TEE WITHOUT CARDIOVERSION N/A 03/26/2021   Procedure: TRANSESOPHAGEAL ECHOCARDIOGRAM (TEE);  Surgeon: Jerline Pain, MD;  Location: [facility] ENDOSCOPY;   Service: Cardiovascular;  Laterality: N/A;   TOTAL KNEE ARTHROPLASTY      Social History:  reports that she has been smoking cigarettes. She has a 10.00 pack-year smoking history. She has never used smokeless tobacco. She reports that she does not drink alcohol and does not use drugs.  Allergies  Allergen Reactions   Penicillins Anaphylaxis and Other (See Comments)    Has patient had a PCN reaction causing immediate rash, facial/tongue/throat swelling, SOB or lightheadedness with hypotension: Yes Has patient had a PCN reaction causing severe rash involving mucus membranes or skin necrosis: No Has patient had a PCN reaction that required hospitalization: Yes Has patient had a PCN reaction occurring within the last 10 years: No If all of the above answers are "NO", then may proceed with Cephalosporin use.    Demerol Nausea And Vomiting   Meperidine Nausea And Vomiting   Morphine And Related Nausea And Vomiting    Family History  Problem Relation Age of Onset   Cancer Mother    Diabetes Mother    Heart disease Mother    Hypertension Mother    Cancer Father    Hypertension Sister    Hypertension Brother    Diabetes Brother    Colon cancer Neg Hx      Prior to Admission medications   Medication Sig Start Date End Date Taking? Authorizing Provider  acetaminophen (TYLENOL) 325 MG tablet Take 2 tablets (650 mg total) by mouth every 6 (six) hours as needed for mild pain (or Fever >/= 101). 03/27/21   Arrien, Jimmy Picket, MD  amiodarone (PACERONE) 200 MG tablet Take one tablet twice daily until 04/08/21, then change to one tablet daily starting on 04/09/21. 03/27/21   Arrien, Jimmy Picket, MD  apixaban (ELIQUIS) 5 MG TABS tablet Take 1 tablet (5 mg total) by mouth 2 (two) times daily. 03/27/21 08/11/21  Arrien, Jimmy Picket, MD  atorvastatin (LIPITOR) 40 MG tablet Take 1 tablet (40 mg total) by mouth daily. 03/28/21 08/11/21  Arrien, Jimmy Picket, MD  escitalopram (LEXAPRO) 10 MG  tablet TAKE 1 TABLET BY MOUTH EVERY DAY. Need TO keep appointment in June FOR future refills 12/13/19   Kerin Perna, NP  furosemide (LASIX) 40 MG tablet Take 1 tablet (40 mg total) by mouth daily. 03/27/21 08/11/21  Arrien, Jimmy Picket, MD  levothyroxine (SYNTHROID) 88 MCG tablet Take 88 mcg by mouth daily. 06/08/22   [provider]  metFORMIN (GLUCOPHAGE-XR) 500 MG 24 hr tablet Take 500 mg by mouth 2 (two) times daily with a meal. 09/11/22   [provider]  metoprolol succinate (TOPROL-XL) 25 MG 24 hr tablet TAKE 1/2 TABLET BY MOUTH EVERY DAY 12/01/22   Skeet Latch, MD  omeprazole (PRILOSEC) 40 MG capsule Take 1 capsule (40 mg total) by mouth daily as needed (acid reflux). 10/16/19   Kerin Perna, NP  potassium chloride SA (KLOR-CON) 20 MEQ tablet Take 1 tablet (20 mEq total) by mouth daily. 03/27/21 08/11/21  Tawni Millers, MD    Physical Exam: Vitals:   12/05/22 2015 12/05/22 2045 12/05/22 2130  12/05/22 2200  BP:  111/69  (!) 105/47  Pulse: 89  84 92  Resp: (!) 21 20 (!) 23 (!) 28  Temp:      TempSrc:      SpO2: 98%  100% 98%  Weight:      Height:       *** Constitutional: NAD, calm, comfortable Eyes: PERRL, lids and conjunctivae normal ENMT: Mucous membranes are moist. Posterior pharynx clear of any exudate or lesions.Normal dentition.  Neck: normal, supple, no masses. Respiratory: clear to auscultation bilaterally, no wheezing, no crackles. Normal respiratory effort. No accessory muscle use.  Cardiovascular: Regular rate and rhythm, no murmurs / rubs / gallops. No extremity edema. 2+ pedal pulses. Abdomen: no tenderness, no masses palpated. No hepatosplenomegaly. Bowel sounds positive.  Musculoskeletal: no clubbing / cyanosis. No joint deformity upper and lower extremities. Good ROM, no contractures. Normal muscle tone.  Skin: no rashes, lesions, ulcers. No induration Neurologic: CN 2-12 grossly intact. Sensation intact. Strength 5/5 in  all 4.  Psychiatric: Normal judgment and insight. Alert and oriented x 3. Normal mood.   EKG: Personally reviewed. ***  Assessment/Plan Principal Problem:   Symptomatic anemia Active Problems:   Acute on chronic diastolic CHF (congestive heart failure) (HCC)   PAF (paroxysmal atrial fibrillation) (HCC)   Essential hypertension, benign   Hypothyroidism   Hepatic cirrhosis ([place])   Depression with anxiety   *** No notes on file *** Assessment and Plan: No notes have been filed under this hospital service. Service: Hospitalist      DVT prophylaxis: ***  Code Status: ***  Family Communication: ***  Disposition Plan: ***  Consults called: ***  Severity of Illness: {Observation/Inpatient:21159}  Zada Finders MD [facility]  If 7PM-7AM, please contact night-coverage [email or web address]  12/05/2022, 11:44 PM

## 2022-12-06 ENCOUNTER — Observation Stay (HOSPITAL_BASED_OUTPATIENT_CLINIC_OR_DEPARTMENT_OTHER): Payer: Medicare HMO

## 2022-12-06 DIAGNOSIS — D649 Anemia, unspecified: Secondary | ICD-10-CM | POA: Diagnosis not present

## 2022-12-06 DIAGNOSIS — J9611 Chronic respiratory failure with hypoxia: Secondary | ICD-10-CM

## 2022-12-06 DIAGNOSIS — I5033 Acute on chronic diastolic (congestive) heart failure: Secondary | ICD-10-CM

## 2022-12-06 LAB — BASIC METABOLIC PANEL
Anion gap: 10 (ref 5–15)
BUN: 12 mg/dL (ref 8–23)
CO2: 34 mmol/L — ABNORMAL HIGH (ref 22–32)
Calcium: 8.2 mg/dL — ABNORMAL LOW (ref 8.9–10.3)
Chloride: 92 mmol/L — ABNORMAL LOW (ref 98–111)
Creatinine, Ser: 0.87 mg/dL (ref 0.44–1.00)
GFR, Estimated: >60 mL/min (ref >60)
Glucose, Bld: 101 mg/dL — ABNORMAL HIGH (ref 70–99)
Potassium: 3.8 mmol/L (ref 3.5–5.1)
Sodium: 136 mmol/L (ref 135–145)

## 2022-12-06 LAB — ABO/RH: ABO/RH(D): A POS

## 2022-12-06 LAB — CBC
HCT: 27.6 % — ABNORMAL LOW (ref 36.0–46.0)
Hemoglobin: 7.5 g/dL — ABNORMAL LOW (ref 12.0–15.0)
MCH: 22.5 pg — ABNORMAL LOW (ref 26.0–34.0)
MCHC: 27.2 g/dL — ABNORMAL LOW (ref 30.0–36.0)
MCV: 82.9 fL (ref 80.0–100.0)
Platelets: 122 10*3/uL — ABNORMAL LOW (ref 150–400)
RBC: 3.33 MIL/uL — ABNORMAL LOW (ref 3.87–5.11)
RDW: 18 % — ABNORMAL HIGH (ref 11.5–15.5)
WBC: 6.1 10*3/uL (ref 4.0–10.5)
nRBC: 0 % (ref 0.0–0.2)

## 2022-12-06 LAB — ECHOCARDIOGRAM COMPLETE
AR max vel: 1.62 cm2
AV Area VTI: 1.44 cm2
AV Area mean vel: 1.56 cm2
AV Mean grad: 3 mmHg
AV Peak grad: 6 mmHg
Ao pk vel: 1.22 m/s
Area-P 1/2: 4.6 cm2
Height: 67 in
S' Lateral: 2.8 cm
Weight: 5835.2 oz

## 2022-12-06 LAB — HEMOGLOBIN AND HEMATOCRIT, BLOOD
HCT: 26.8 % — ABNORMAL LOW (ref 36.0–46.0)
Hemoglobin: 7.4 g/dL — ABNORMAL LOW (ref 12.0–15.0)

## 2022-12-06 LAB — PREPARE RBC (CROSSMATCH)

## 2022-12-06 LAB — MAGNESIUM: Magnesium: 1.8 mg/dL (ref 1.7–2.4)

## 2022-12-06 LAB — HIV ANTIBODY (ROUTINE TESTING W REFLEX): HIV Screen 4th Generation wRfx: NONREACTIVE

## 2022-12-06 MED ORDER — LEVOTHYROXINE SODIUM 88 MCG PO TABS
88.0000 ug | ORAL_TABLET | Freq: Every day | ORAL | Status: DC
  Administered 2022-12-06: 88 ug via ORAL
  Filled 2022-12-06: qty 1

## 2022-12-06 MED ORDER — METOPROLOL SUCCINATE ER 25 MG PO TB24
12.5000 mg | ORAL_TABLET | Freq: Every day | ORAL | Status: DC
  Administered 2022-12-06: 12.5 mg via ORAL
  Filled 2022-12-06: qty 1

## 2022-12-06 MED ORDER — SODIUM CHLORIDE 0.9% FLUSH
3.0000 mL | Freq: Two times a day (BID) | INTRAVENOUS | Status: DC
  Administered 2022-12-06 (×2): 3 mL via INTRAVENOUS

## 2022-12-06 MED ORDER — SENNOSIDES-DOCUSATE SODIUM 8.6-50 MG PO TABS: 1.0000 | ORAL_TABLET | Freq: Every evening | ORAL | Status: DC | PRN

## 2022-12-06 MED ORDER — POTASSIUM CHLORIDE CRYS ER 20 MEQ PO TBCR
20.0000 meq | EXTENDED_RELEASE_TABLET | Freq: Every evening | ORAL | Status: DC
  Administered 2022-12-06: 20 meq via ORAL
  Filled 2022-12-06: qty 1

## 2022-12-06 MED ORDER — FERROUS SULFATE 325 (65 FE) MG PO TBEC: 325.0000 mg | DELAYED_RELEASE_TABLET | Freq: Two times a day (BID) | ORAL | 0 refills | Status: AC

## 2022-12-06 MED ORDER — AMIODARONE HCL 200 MG PO TABS
200.0000 mg | ORAL_TABLET | Freq: Every day | ORAL | Status: DC
  Administered 2022-12-06: 200 mg via ORAL
  Filled 2022-12-06: qty 1

## 2022-12-06 MED ORDER — ACETAMINOPHEN 650 MG RE SUPP: 650.0000 mg | Freq: Four times a day (QID) | RECTAL | Status: DC | PRN

## 2022-12-06 MED ORDER — ALBUTEROL SULFATE (2.5 MG/3ML) 0.083% IN NEBU
2.5000 mg | INHALATION_SOLUTION | Freq: Four times a day (QID) | RESPIRATORY_TRACT | Status: DC | PRN
  Administered 2022-12-06: 2.5 mg via RESPIRATORY_TRACT
  Filled 2022-12-06: qty 3

## 2022-12-06 MED ORDER — FUROSEMIDE 10 MG/ML IJ SOLN
40.0000 mg | Freq: Two times a day (BID) | INTRAMUSCULAR | Status: DC
  Administered 2022-12-06 (×2): 40 mg via INTRAVENOUS
  Filled 2022-12-06 (×2): qty 4

## 2022-12-06 MED ORDER — PERFLUTREN LIPID MICROSPHERE
1.0000 mL | INTRAVENOUS | Status: AC | PRN
  Administered 2022-12-06: 2 mL via INTRAVENOUS

## 2022-12-06 MED ORDER — PANTOPRAZOLE SODIUM 40 MG PO TBEC
40.0000 mg | DELAYED_RELEASE_TABLET | Freq: Every day | ORAL | Status: DC
  Administered 2022-12-06: 40 mg via ORAL
  Filled 2022-12-06: qty 1

## 2022-12-06 MED ORDER — ONDANSETRON HCL 4 MG PO TABS: 4.0000 mg | ORAL_TABLET | Freq: Four times a day (QID) | ORAL | Status: DC | PRN

## 2022-12-06 MED ORDER — ATORVASTATIN CALCIUM 40 MG PO TABS
40.0000 mg | ORAL_TABLET | Freq: Every day | ORAL | Status: DC
  Administered 2022-12-06: 40 mg via ORAL
  Filled 2022-12-06: qty 1

## 2022-12-06 MED ORDER — ACETAMINOPHEN 325 MG PO TABS
650.0000 mg | ORAL_TABLET | Freq: Four times a day (QID) | ORAL | Status: DC | PRN
  Administered 2022-12-06: 650 mg via ORAL
  Filled 2022-12-06: qty 2

## 2022-12-06 MED ORDER — ONDANSETRON HCL 4 MG/2ML IJ SOLN: 4.0000 mg | Freq: Four times a day (QID) | INTRAMUSCULAR | Status: DC | PRN

## 2022-12-06 MED ORDER — ESCITALOPRAM OXALATE 10 MG PO TABS
10.0000 mg | ORAL_TABLET | Freq: Every day | ORAL | Status: DC
  Administered 2022-12-06: 10 mg via ORAL
  Filled 2022-12-06: qty 1

## 2022-12-06 NOTE — Progress Notes (Signed)
Pt informed about d/c orders however pt states she is not ready and she wants to appeal her d/c. MD, charge nurse and CM informed.

## 2022-12-06 NOTE — Assessment & Plan Note (Signed)
Continue Toprol-XL. 

## 2022-12-06 NOTE — Discharge Summary (Addendum)
Physician Discharge Summary  [name] [id number] DOB: [birth year] DOA: 12/05/2022  PCP: Clovia Cuff, MD  Admit date: 12/05/2022 Discharge date: 12/06/2022  Admitted From: Home Disposition: Home  Recommendations for Outpatient Follow-up:  Follow up with PCP in 1-2 weeks Repeat hemoglobin and iron levels in the next few weeks to ensure improving numbers  Home Health: None Equipment/Devices: None  Discharge Condition: Stable CODE STATUS: Full Diet recommendation: Low-salt low-fat diet  Brief/Interim Summary: [name] is a 67 y.o. 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   Patient notes per outpatient workup she is having progressive anemia with worsening fatigue and weakness, having recently completed radiation treatment for squamous cell of the upper lip.  No recent labs here however her baseline hemoglobin appears to be around 12 around 1 year ago.  Patient was sent to the hospital due to concern over bleeding as she is on Eliquis.  Patient's imaging and stool testing was unremarkable for blood bleeding or blood loss.  Patient's iron panel did come back low iron with borderline B12 levels.  Given patient's poor p.o. intake sedentary lifestyle and comorbid conditions she is likely at risk for chronic iron deficiency and anemia of chronic disease.  Patient received 1 unit PRBC at intake for symptomatic anemia with really no change in hemoglobin, repeat labs today remained stable and again without any signs or symptoms of bleeding.  We did discuss holding Eliquis at discharge but will defer to primary outpatient PCP and cardiology for this decision given her risk factors for embolic  event.  Patient appealing discharge. States "I don't feel right" without further clarification. No further workup indicated at this time. Will follow overnight.  Discharge Diagnoses:  Principal Problem:   Symptomatic anemia Active Problems:   Acute on chronic diastolic CHF (congestive heart failure) (HCC)   PAF (paroxysmal atrial fibrillation) (HCC)   Chronic respiratory failure with hypoxia (HCC)   Essential hypertension, benign   Hypothyroidism   Hepatic cirrhosis (HCC)   Depression with anxiety  Symptomatic anemia Presumed iron deficiency versus chronic anemia of chronic disease Distant labs hemoglobin 12 -appears to have been downtrending in the outpatient setting over the past few months On Eliquis but no signs/symptoms of bleeding, FOBT negative -discussed with PCP and cardiology the risk versus benefits of continuing Eliquis Iron minimally low -p.o. iron twice daily dosing for at least 1 month, recheck levels and evaluate Hgb stable s/p transfusion   Acute on chronic diastolic CHF (congestive heart failure) ([place]) -Appears to be approaching euvolemia, transition back to p.o. diuretics -Continue Toprol-XL 12.5 mg daily -Repeat echo somewhat limited EF 55 to 60% -Strict I/O's and daily weights recommended at discharge   Chronic respiratory failure with hypoxia ([place]) Secondary to chronic HFpEF and OHS.  Stable on home 4 L O2 via [place].   PAF (paroxysmal atrial fibrillation) (HCC) Remains rate controlled on amiodarone, Toprol-XL.  Resume Eliquis at discharge, further discussion outpatient for cessation given no signs or symptoms of bleeding at this time there is no indication to hold this medication   Essential hypertension, benign Continue Toprol-XL.   Depression with anxiety Continue Lexapro.   Hepatic cirrhosis (HCC) Chronic and stable.   Hypothyroidism Continue Synthroid.  Discharge Instructions  Discharge Instructions  Discharge patient   Complete by: As  directed    Discharge disposition: 01-Home or Self Care   Discharge patient date: 12/06/2022      Allergies as of 12/06/2022       Reactions   Penicillins Anaphylaxis, Other (See Comments)   Has patient had a PCN reaction causing immediate rash, facial/tongue/throat swelling, SOB or lightheadedness with hypotension: Yes Has patient had a PCN reaction causing severe rash involving mucus membranes or skin necrosis: No Has patient had a PCN reaction that required hospitalization: Yes Has patient had a PCN reaction occurring within the last 10 years: No If all of the above answers are "NO", then may proceed with Cephalosporin use.   Demerol Nausea And Vomiting   Meperidine Nausea And Vomiting   Morphine And Related Nausea And Vomiting        Medication List     TAKE these medications    acetaminophen 325 MG tablet Commonly known as: TYLENOL Take 2 tablets (650 mg total) by mouth every 6 (six) hours as needed for mild pain (or Fever >/= 101).   albuterol (2.5 MG/3ML) 0.083% nebulizer solution Commonly known as: PROVENTIL Take by nebulization.   albuterol 108 (90 Base) MCG/ACT inhaler Commonly known as: VENTOLIN HFA Inhale into the lungs.   amiodarone 200 MG tablet Commonly known as: PACERONE Take one tablet twice daily until 04/08/21, then change to one tablet daily starting on 04/09/21. What changed:  how much to take how to take this when to take this additional instructions   atorvastatin 40 MG tablet Commonly known as: LIPITOR Take 40 mg by mouth daily. What changed: Another medication with the same name was removed. Continue taking this medication, and follow the directions you see here.   Eliquis 5 MG Tabs tablet Generic drug: apixaban Take 5 mg by mouth 2 (two) times daily. What changed: Another medication with the same name was removed. Continue taking this medication, and follow the directions you see here.   escitalopram 10 MG tablet Commonly known as:  LEXAPRO TAKE 1 TABLET BY MOUTH EVERY DAY. Need TO keep appointment in June FOR future refills   ferrous sulfate 325 (65 FE) MG EC tablet Take 1 tablet (325 mg total) by mouth 2 (two) times daily.   furosemide 40 MG tablet Commonly known as: LASIX Take 40 mg by mouth daily. What changed: Another medication with the same name was removed. Continue taking this medication, and follow the directions you see here.   levothyroxine 88 MCG tablet Commonly known as: SYNTHROID Take 88 mcg by mouth daily.   metFORMIN 500 MG 24 hr tablet Commonly known as: GLUCOPHAGE-XR Take 1,000 mg by mouth daily with breakfast.   metoprolol succinate 25 MG 24 hr tablet Commonly known as: TOPROL-XL TAKE 1/2 TABLET BY MOUTH EVERY DAY   omeprazole 40 MG capsule Commonly known as: PRILOSEC Take 1 capsule (40 mg total) by mouth daily as needed (acid reflux). What changed: when to take this   potassium chloride SA 20 MEQ tablet Commonly known as: KLOR-CON M Take 20 mEq by mouth every evening. What changed: Another medication with the same name was removed. Continue taking this medication, and follow the directions you see here.        Allergies  Allergen Reactions   Penicillins Anaphylaxis and Other (See Comments)    Has patient had a PCN reaction causing immediate rash, facial/tongue/throat swelling, SOB or lightheadedness with hypotension: Yes Has patient had a PCN reaction causing severe rash involving  mucus membranes or skin necrosis: No Has patient had a PCN reaction that required hospitalization: Yes Has patient had a PCN reaction occurring within the last 10 years: No If all of the above answers are "NO", then may proceed with Cephalosporin use.    Demerol Nausea And Vomiting   Meperidine Nausea And Vomiting   Morphine And Related Nausea And Vomiting    Consultations: None  Procedures/Studies: ECHOCARDIOGRAM COMPLETE  Result Date: 12/06/2022    ECHOCARDIOGRAM REPORT   Patient Name:    LAJADA MUNIR Date of Exam: 12/06/2022 Medical Rec #:  [name]     Height:       67.0 in Accession #:    [id number]    Weight:       364.7 lb Date of Birth:  [birth year]     BSA:          2.610 m Patient Age:    30 years      BP:           100/72 mmHg Patient Gender: F             HR:           82 bpm. Exam Location:  Inpatient Procedure: 2D Echo, Intracardiac Opacification Agent, Cardiac Doppler and Color            Doppler Indications:    Congestive heart failure;  History:        Patient has prior history of Echocardiogram examinations, most                 recent 03/22/2021. CHF, Arrythmias:Atrial Fibrillation; Risk                 Factors:Hypertension, Dyslipidemia and Current Smoker.  Sonographer:    Marella Chimes Referring Phys: [id number] [place]  1. Very difficult study due to poor echo windows.  2. Left ventricular ejection fraction, by estimation, is 55 to 60%. The left ventricle has normal function. Left ventricular endocardial border not optimally defined to evaluate regional wall motion. Left ventricular diastolic parameters are indeterminate.  3. Right ventricular systolic function is mildly reduced. The right ventricular size is mildly to moderately enlarged.  4. Right atrial size was mildly dilated.  5. The mitral valve was not well visualized. Trivial mitral valve regurgitation.  6. The aortic valve is tricuspid. Aortic valve regurgitation is not visualized. Aortic valve sclerosis is present, with no evidence of aortic valve stenosis.  7. The inferior vena cava is dilated in size with <50% respiratory variability, suggesting right atrial pressure of 15 mmHg. Comparison(s): No significant change from prior study. FINDINGS  Left Ventricle: Left ventricular ejection fraction, by estimation, is 55 to 60%. The left ventricle has normal function. Left ventricular endocardial border not optimally defined to evaluate regional wall motion. Definity contrast agent was given IV to delineate the  left ventricular endocardial borders. The left ventricular internal cavity size was normal in size. There is no left ventricular hypertrophy. Left ventricular diastolic parameters are indeterminate. Right Ventricle: The right ventricular size is mildly to moderately enlarged. No increase in right ventricular wall thickness. Right ventricular systolic function is mildly reduced. Left Atrium: Left atrial size was not well visualized. Right Atrium: Right atrial size was mildly dilated. Pericardium: There is no evidence of pericardial effusion. Mitral Valve: The mitral valve was not well visualized. Mild to moderate mitral annular calcification. Trivial mitral valve regurgitation. Tricuspid Valve: The tricuspid valve is normal in structure. Tricuspid valve regurgitation  is trivial. Aortic Valve: The aortic valve is tricuspid. Aortic valve regurgitation is not visualized. Aortic valve sclerosis is present, with no evidence of aortic valve stenosis. Aortic valve mean gradient measures 3.0 mmHg. Aortic valve peak gradient measures 6.0  mmHg. Aortic valve area, by VTI measures 1.44 cm. Pulmonic Valve: The pulmonic valve was not well visualized. Pulmonic valve regurgitation is not visualized. Aorta: The aortic root is normal in size and structure. Venous: The inferior vena cava is dilated in size with less than 50% respiratory variability, suggesting right atrial pressure of 15 mmHg. IAS/Shunts: The atrial septum is grossly normal.  LEFT VENTRICLE PLAX 2D LVIDd:         4.20 cm   Diastology LVIDs:         2.80 cm   LV e' medial:    12.50 cm/s LV PW:         1.00 cm   LV E/e' medial:  10.9 LV IVS:        0.90 cm   LV e' lateral:   12.40 cm/s LVOT diam:     1.70 cm   LV E/e' lateral: 11.0 LV SV:         39 LV SV Index:   15 LVOT Area:     2.27 cm  RIGHT VENTRICLE RV S prime:     11.90 cm/s TAPSE (M-mode): 1.3 cm LEFT ATRIUM           Index        RIGHT ATRIUM           Index LA Vol (A4C): 42.7 ml 16.36 ml/m  RA Area:      29.30 cm                                    RA Volume:   99.80 ml  38.23 ml/m  AORTIC VALVE AV Area (Vmax):    1.62 cm AV Area (Vmean):   1.56 cm AV Area (VTI):     1.44 cm AV Vmax:           122.00 cm/s AV Vmean:          81.200 cm/s AV VTI:            0.268 m AV Peak Grad:      6.0 mmHg AV Mean Grad:      3.0 mmHg LVOT Vmax:         86.90 cm/s LVOT Vmean:        55.900 cm/s LVOT VTI:          0.170 m LVOT/AV VTI ratio: 0.63  AORTA Ao Root diam: 2.60 cm Ao Asc diam:  3.60 cm MITRAL VALVE                TRICUSPID VALVE MV Area (PHT): 4.60 cm     TR Peak grad:   42.8 mmHg MV Decel Time: 165 msec     TR Vmax:        327.00 cm/s MV E velocity: 136.00 cm/s                             SHUNTS                             Systemic VTI:  0.17 m  Systemic Diam: 1.70 cm Gwyndolyn Kaufman MD Electronically signed by Gwyndolyn Kaufman MD Signature Date/Time: 12/06/2022/11:31:42 AM    Final    CT ABDOMEN PELVIS W CONTRAST  Result Date: 12/05/2022 CLINICAL DATA:  Weakness, central abdominal pain, thrombocytopenia EXAM: CT ABDOMEN AND PELVIS WITH CONTRAST TECHNIQUE: Multidetector CT imaging of the abdomen and pelvis was performed using the standard protocol following bolus administration of intravenous contrast. RADIATION DOSE REDUCTION: This exam was performed according to the departmental dose-optimization program which includes automated exposure control, adjustment of the mA and/or kV according to patient size and/or use of iterative reconstruction technique. CONTRAST:  155mL OMNIPAQUE IOHEXOL 300 MG/ML  SOLN COMPARISON:  03/27/2021 FINDINGS: Evaluation is limited by patient body habitus. Lower chest: Trace bilateral pleural effusions and dependent consolidation, favoring atelectasis. Hepatobiliary: Cholecystectomy. Nodular contour of the liver consistent with cirrhosis. No focal liver abnormality. Portions of the superior right lobe liver are excluded by slice selection. Pancreas:  Unremarkable. No pancreatic ductal dilatation or surrounding inflammatory changes. Spleen: Normal in size without focal abnormality. Adrenals/Urinary Tract: Stable 1.7 cm left adrenal nodule with attenuation of 34 HU, favor adenoma. Right adrenals unremarkable. No acute renal findings. No urinary tract calculi or obstruction. The bladder is unremarkable. Stomach/Bowel: No bowel obstruction or ileus. Scattered diverticulosis of the sigmoid colon without diverticulitis. The appendix is surgically absent. No bowel wall thickening or inflammatory change. Vascular/Lymphatic: Aortic atherosclerosis. No enlarged abdominal or pelvic lymph nodes. Reproductive: Status post hysterectomy. No adnexal masses. Other: No free fluid or free intraperitoneal gas. No abdominal wall hernia. Musculoskeletal: Stable severe degenerative changes of the right hip with bony remodeling of the femoral head and acetabulum. No acute displaced fractures. Reconstructed images demonstrate no additional findings. IMPRESSION: 1. Limited study due to patient body habitus. 2. Trace bilateral pleural effusions, with dependent lower lobe consolidation favor atelectasis. 3. Sigmoid diverticulosis without diverticulitis. 4. Nodular contour of the liver consistent with cirrhosis. 5. Stable 1.7 cm left adrenal nodule favoring benign adenoma. 6.  Aortic Atherosclerosis (ICD10-I70.0). Electronically Signed   By: Randa Ngo M.D.   On: 12/05/2022 22:52   DG Chest Portable 1 View  Result Date: 12/05/2022 CLINICAL DATA:  Dyspnea.  Edema. EXAM: PORTABLE CHEST 1 VIEW COMPARISON:  Radiograph 03/22/2021 and 10/06/2012.  CT 03/25/2021. FINDINGS: 1710 hours. Stable cardiomegaly, aortic atherosclerosis and central enlargement of the pulmonary arteries. Stable chronic interstitial opacities within both lungs. No definite edema, confluent airspace opacity, pleural effusion or pneumothorax. The bones appear unchanged, without acute findings. Telemetry leads overlie  the chest. IMPRESSION: 1. Stable cardiomegaly and chronic interstitial lung disease. 2. No acute cardiopulmonary process. Electronically Signed   By: Richardean Sale M.D.   On: 12/05/2022 17:56     Subjective: No acute issues or events overnight denies nausea vomiting diarrhea constipation any fevers chills or chest pain.  Denies any signs or symptoms of bleeding including black stool, bright red blood per rectum, dark red or brown urine, large bruising, hematemesis or coffee-ground emesis.   Discharge Exam: Vitals:   12/06/22 1143 12/06/22 1144  BP: 97/77 97/77  Pulse: 72 72  Resp: 20   Temp: 98.3 F (36.8 C) 98.3 F (36.8 C)  SpO2: 94% 91%   Vitals:   12/06/22 0412 12/06/22 0742 12/06/22 1143 12/06/22 1144  BP: 100/72 (!) 106/52 97/77 97/77   Pulse: 82 73 72 72  Resp: (!) 21 15 20    Temp: 97.7 F (36.5 C) 97.7 F (36.5 C) 98.3 F (36.8 C) 98.3 F (36.8 C)  TempSrc: Oral  Oral Oral Oral  SpO2: 94% 91% 94% 91%  Weight:      Height:        General: Pt is alert, awake, not in acute distress Cardiovascular: RRR, S1/S2 +, no rubs, no gallops Respiratory: CTA bilaterally, no wheezing, no rhonchi Abdominal: Soft, NT, ND, bowel sounds + Extremities: no edema, no cyanosis    The results of significant diagnostics from this hospitalization (including imaging, microbiology, ancillary and laboratory) are listed below for reference.     Microbiology: No results found for this or any previous visit (from the past 240 hour(s)).   Labs: BNP (last 3 results) Recent Labs    12/05/22 1810  BNP 0000000*   Basic Metabolic Panel: Recent Labs  Lab 12/05/22 1810 12/06/22 0656  NA 133* 136  K 4.1 3.8  CL 90* 92*  CO2 35* 34*  GLUCOSE 100* 101*  BUN 14 12  CREATININE 0.85 0.87  CALCIUM 8.3* 8.2*  MG  --  1.8   Liver Function Tests: Recent Labs  Lab 12/05/22 1810  AST 15  ALT 9  ALKPHOS 41  BILITOT 0.6  PROT 7.9  ALBUMIN 3.3*   Recent Labs  Lab 12/05/22 1810   LIPASE 22   No results for input(s): "AMMONIA" in the last 168 hours. CBC: Recent Labs  Lab 12/05/22 1810 12/06/22 0656 12/06/22 1247  WBC 6.0 6.1  --   NEUTROABS 4.5  --   --   HGB 7.4* 7.5* 7.4*  HCT 27.4* 27.6* 26.8*  MCV 81.8 82.9  --   PLT 157 122*  --    Anemia work up Recent Labs    12/05/22 2029  VITAMINB12 367  FOLATE 8.6  FERRITIN 11  TIBC 444  IRON 21*  RETICCTPCT 3.0   Urinalysis    Component Value Date/Time   COLORURINE STRAW (A) 12/05/2022 1908   APPEARANCEUR CLEAR 12/05/2022 1908   LABSPEC 1.004 (L) 12/05/2022 1908   PHURINE 7.0 12/05/2022 1908   GLUCOSEU NEGATIVE 12/05/2022 1908   HGBUR SMALL (A) 12/05/2022 1908   BILIRUBINUR NEGATIVE 12/05/2022 1908   KETONESUR NEGATIVE 12/05/2022 1908   PROTEINUR NEGATIVE 12/05/2022 1908   UROBILINOGEN 1.0 03/11/2009 0437   NITRITE NEGATIVE 12/05/2022 1908   LEUKOCYTESUR NEGATIVE 12/05/2022 1908   Sepsis Labs Recent Labs  Lab 12/05/22 1810 12/06/22 0656  WBC 6.0 6.1   Microbiology No results found for this or any previous visit (from the past 240 hour(s)).   Time coordinating discharge: Over 30 minutes  SIGNED:   Little Ishikawa, DO [facility] 12/06/2022, 1:27 PM Pager   If 7PM-7AM, please contact night-coverage [email or web address]

## 2022-12-06 NOTE — Assessment & Plan Note (Signed)
Chronic and stable.   

## 2022-12-06 NOTE — Assessment & Plan Note (Signed)
Continue Lexapro

## 2022-12-06 NOTE — Assessment & Plan Note (Signed)
Continue Synthroid °

## 2022-12-06 NOTE — Progress Notes (Signed)
Pt refusing blood gas to be done. Talked to CM, cancelling her appeal, wants to go home. MD informed

## 2022-12-06 NOTE — Progress Notes (Signed)
Pt anxious, saying she "can't breathe". SO at bedside. MD paged about anxiety meds, RT at bedside giving PRN breathing th. Pt repositioned, VS stable for now, MD informed.

## 2022-12-06 NOTE — Progress Notes (Signed)
Patient discharged: Home with family  Via: [facility]  Discharge paperwork given: to patient and family  Reviewed with teach back  IV and telemetry disconnected  Belongings given to patient

## 2022-12-06 NOTE — Assessment & Plan Note (Signed)
Remains in atrial fibrillation with controlled rate.  Continue amiodarone, Toprol-XL.  Holding Eliquis for now given anemia.  If hemoglobin improving appropriately with blood transfusion and no sign of active bleeding then resume Eliquis.

## 2022-12-06 NOTE — Progress Notes (Signed)
Pt refused ABG

## 2022-12-06 NOTE — Care Management Important Message (Signed)
Important Message  Patient Details  Name: TIONNI BAAB MRN: [name] Date of Birth: May 10, 1956   Medicare Important Message Given:  Yes     Henrietta Dine, RN 12/06/2022, 3:35 PM

## 2022-12-06 NOTE — TOC Initial Note (Signed)
Transition of Care [facility]) - Initial/Assessment Note    Patient Details  Name: [name] MRN: [name] Date of Birth: Nov 29, 1955  Transition of Care [facility]) CM/SW Contact:    Henrietta Dine, RN Phone Number: 12/06/2022, 4:11 PM  Clinical Narrative:                 See progress note regarding [employer] appeal; pt needs transport home; called [facility] at 1610; spoke w operator # [id number]; no TOC needs.  Expected Discharge Plan: Home/Self Care Barriers to Discharge: No Barriers Identified   Patient Goals and CMS Choice Patient states their goals for this hospitalization and ongoing recovery are:: home          Expected Discharge Plan and Services   Discharge Planning Services: CM Consult Post Acute Care Choice: Durable Medical Equipment, Resumption of Svcs/PTA Provider (cane, wheelchair, home oxygen w [employer]) Living arrangements for the past 2 months: Single Family Home Expected Discharge Date: 12/06/22                                    Prior Living Arrangements/Services Living arrangements for the past 2 months: Single Family Home Lives with:: Significant Other Patient language and need for interpreter reviewed:: Yes Do you feel safe going back to the place where you live?: Yes      Need for Family Participation in Patient Care: Yes (Comment) Care giver support system in place?: Yes (comment) Current home services: DME (cane, wheelchair, home oxygen w [employer]) Criminal Activity/Legal Involvement Pertinent to Current Situation/Hospitalization: No - Comment as needed  Activities of Daily Living Home Assistive Devices/Equipment: Wheelchair ADL Screening (condition at time of admission) Patient's cognitive ability adequate to safely complete daily activities?: Yes Is the patient deaf or have difficulty hearing?: No Does the patient have difficulty seeing, even when wearing glasses/contacts?: Yes Does the patient have difficulty concentrating, remembering, or making  decisions?: No Patient able to express need for assistance with ADLs?: Yes Does the patient have difficulty dressing or bathing?: Yes Independently performs ADLs?: No Communication: Independent Dressing (OT): Needs assistance Is this a change from baseline?: Pre-admission baseline Grooming: Needs assistance Is this a change from baseline?: Pre-admission baseline Feeding: Independent Bathing: Needs assistance Is this a change from baseline?: Pre-admission baseline Toileting: Needs assistance Is this a change from baseline?: Pre-admission baseline In/Out Bed: Needs assistance Is this a change from baseline?: Pre-admission baseline Walks in Home: Needs assistance Is this a change from baseline?: Pre-admission baseline Does the patient have difficulty walking or climbing stairs?: Yes Weakness of Legs: Both Weakness of Arms/Hands: None  Permission Sought/Granted Permission sought to share information with : Case Manager Permission granted to share information with : Yes, Verbal Permission Granted  Share Information with NAME: Lenor Coffin, RN, CM     Permission granted to share info w Relationship: Celada Lions (SO) [phone]     Emotional Assessment Appearance:: Appears stated age Attitude/Demeanor/Rapport: Gracious Affect (typically observed): Accepting Orientation: : Oriented to Self, Oriented to Place, Oriented to  Time, Oriented to Situation Alcohol / Substance Use: Not Applicable Psych Involvement: No (comment)  Admission diagnosis:  Symptomatic anemia [D64.9] Cirrhosis of liver without ascites, unspecified hepatic cirrhosis type [K74.60] Acute congestive heart failure, unspecified heart failure type [I50.9] Patient Active Problem List   Diagnosis Date Noted   Chronic respiratory failure with hypoxia ([place]) 12/06/2022   Symptomatic anemia 12/05/2022   Squamous cell skin cancer, face  06/09/2022   Acute on chronic diastolic CHF (congestive heart failure) ([place]) 03/26/2021    PAF (paroxysmal atrial fibrillation) ([place]) 03/22/2021   Acute hypercapnic respiratory failure ([place]) 03/22/2021   Diarrhea 03/22/2021   Ankle fracture, bimalleolar, closed, right, initial encounter 05/11/2018   Depression with anxiety 03/24/2018   Osteopenia 02/15/2017   Dyslipidemia with high LDL and low HDL 02/01/2017   Morbid obesity ([place]) 02/01/2017   Seborrheic keratoses 01/25/2017   Filiform wart 01/25/2017   Left carpal tunnel syndrome 03/23/2016   Benign neoplasm of transverse colon    Benign neoplasm of descending colon    Benign neoplasm of ascending colon    Gastroesophageal reflux disease with esophagitis    Depression 12/24/2015   Numbness and tingling in left hand 12/24/2015   History of hepatitis C 08/26/2015   Elevated hemoglobin A1c 08/26/2015   Chronic diarrhea 05/28/2015   Benign neoplasm of colon 02/26/2015   Varicose veins of both lower extremities with complications [id number]   Chronic venous insufficiency 01/11/2015   Hepatic cirrhosis ([place]) 08/07/2014   Change in bowel habits 07/19/2014   Essential hypertension, benign 03/19/2014   Smoking 03/19/2014   Numerous moles 03/19/2014   Leg length discrepancy 04/14/2013   HTN (hypertension) 03/28/2013   Vitamin D deficiency 02/02/2012   Degenerative joint disease 02/02/2012   Osteoporosis 02/02/2012   Hepatitis C 02/02/2012   CLAUDICATION 04/29/2009   Hypothyroidism 04/25/2009   Thrombocytopenia ([place]) 04/25/2009   PCP:  Clovia Cuff, MD Pharmacy:   [facility] - [place], [place] - [address] [place] [address] Phone: [phone] Fax: [phone]     Social Determinants of Health (SDOH) Social History: SDOH Screenings   Food Insecurity: No Food Insecurity (12/06/2022)  Housing: Low Risk  (12/06/2022)  Transportation Needs: Unmet Transportation Needs (12/06/2022)  Utilities: Not At Risk (12/06/2022)  Depression (PHQ2-9): Low Risk  (08/23/2019)  Financial  Resource Strain: Medium Risk (08/23/2019)  Physical Activity: Inactive (08/23/2019)  Social Connections: Socially Isolated (03/17/2019)  Stress: Stress Concern Present (03/17/2019)  Tobacco Use: High Risk (12/05/2022)   SDOH Interventions: Transportation Interventions: Inpatient TOC   Readmission Risk Interventions     No data to display

## 2022-12-06 NOTE — Assessment & Plan Note (Signed)
Appears volume overloaded with reported increased lower extremity edema from baseline.  Trace bilateral pleural effusions and dependent atelectasis noted on CT.  BNP 226.3. -Continue IV Lasix 40 mg twice daily -Continue Toprol-XL 12.5 mg daily -Update echocardiogram -Strict I/O's and daily weights

## 2022-12-06 NOTE — Progress Notes (Signed)
  Echocardiogram 2D Echocardiogram has been performed.  [name] 12/06/2022, 10:57 AM

## 2022-12-06 NOTE — Assessment & Plan Note (Signed)
Secondary to chronic HFpEF and OHS.  Stable on home 4 L O2 via [place].

## 2022-12-06 NOTE — Assessment & Plan Note (Signed)
Hemoglobin 7.4 on admit.  Last on file was 12.12 March 2021.  She does take Eliquis regularly.  She denies any obvious bleeding.  FOBT is negative.  Labs show iron deficiency and B12 in the gray zone. -Transfuse 1 unit PRBC -Holding Eliquis for now -Monitor for any signs/symptoms of active bleeding

## 2022-12-06 NOTE — Hospital Course (Signed)
[name] is a 67 y.o. 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

## 2022-12-06 NOTE — Plan of Care (Signed)
  Problem: Education: Goal: Ability to demonstrate management of disease process will improve Outcome: Progressing Goal: Ability to verbalize understanding of medication therapies will improve Outcome: Progressing   Problem: Education: Goal: Knowledge of General Education information will improve Description: Including pain rating scale, medication(s)/side effects and non-pharmacologic comfort measures Outcome: Progressing   Problem: Health Behavior/Discharge Planning: Goal: Ability to manage health-related needs will improve Outcome: Progressing   Problem: Activity: Goal: Capacity to carry out activities will improve Outcome: Not Progressing

## 2022-12-06 NOTE — TOC Progression Note (Addendum)
Transition of Care [facility]) - Progression Note    Patient Details  Name: [name] MRN: [id number] Date of Birth: 02-Jun-1956  Transition of Care [facility]) CM/SW Contact  Henrietta Dine, RN Phone Number: 12/06/2022, 2:56 PM  Clinical Narrative:    TOC notified pt wants to appeal d/c; spoke w/ pt and SO in room; pt says she does not feel right; her SO says the last time this happened she cardiac arrested.  -1515-pt signed and given copy of Medicare IM; pt says she does not want to file appeal b/c she would be more comfortable at home; pt also says she needs transport home by ambulance b/c she did not bring her wheelchair; see initial note.        Expected Discharge Plan and Services         Expected Discharge Date: 12/06/22                                     Social Determinants of Health (SDOH) Interventions SDOH Screenings   Food Insecurity: No Food Insecurity (12/06/2022)  Housing: Low Risk  (12/06/2022)  Transportation Needs: Unmet Transportation Needs (12/06/2022)  Utilities: Not At Risk (12/06/2022)  Depression (PHQ2-9): Low Risk  (08/23/2019)  Financial Resource Strain: Medium Risk (08/23/2019)  Physical Activity: Inactive (08/23/2019)  Social Connections: Socially Isolated (03/17/2019)  Stress: Stress Concern Present (03/17/2019)  Tobacco Use: High Risk (12/05/2022)    Readmission Risk Interventions     No data to display

## 2022-12-07 LAB — TYPE AND SCREEN
ABO/RH(D): A POS
Antibody Screen: NEGATIVE
Unit division: 0

## 2022-12-07 LAB — BPAM RBC
Blood Product Expiration Date: 202404222359
ISSUE DATE / TIME: 202403310128
Unit Type and Rh: 6200

## 2023-01-25 ENCOUNTER — Encounter (HOSPITAL_COMMUNITY): Payer: Self-pay

## 2023-01-25 ENCOUNTER — Emergency Department (HOSPITAL_COMMUNITY): Payer: Medicare HMO

## 2023-01-25 ENCOUNTER — Other Ambulatory Visit: Payer: Self-pay

## 2023-01-25 ENCOUNTER — Inpatient Hospital Stay (HOSPITAL_COMMUNITY)
Admission: EM | Admit: 2023-01-25 | Discharge: 2023-02-06 | DRG: 291 | Disposition: E | Payer: Medicare HMO | Attending: Internal Medicine | Admitting: Internal Medicine

## 2023-01-25 DIAGNOSIS — J9801 Acute bronchospasm: Secondary | ICD-10-CM | POA: Diagnosis not present

## 2023-01-25 DIAGNOSIS — K219 Gastro-esophageal reflux disease without esophagitis: Secondary | ICD-10-CM | POA: Diagnosis present

## 2023-01-25 DIAGNOSIS — M810 Age-related osteoporosis without current pathological fracture: Secondary | ICD-10-CM | POA: Diagnosis present

## 2023-01-25 DIAGNOSIS — E8729 Other acidosis: Secondary | ICD-10-CM | POA: Diagnosis not present

## 2023-01-25 DIAGNOSIS — Z8672 Personal history of thrombophlebitis: Secondary | ICD-10-CM

## 2023-01-25 DIAGNOSIS — Z79890 Hormone replacement therapy (postmenopausal): Secondary | ICD-10-CM

## 2023-01-25 DIAGNOSIS — E1122 Type 2 diabetes mellitus with diabetic chronic kidney disease: Secondary | ICD-10-CM | POA: Diagnosis present

## 2023-01-25 DIAGNOSIS — Z90710 Acquired absence of both cervix and uterus: Secondary | ICD-10-CM

## 2023-01-25 DIAGNOSIS — I509 Heart failure, unspecified: Secondary | ICD-10-CM

## 2023-01-25 DIAGNOSIS — Z79899 Other long term (current) drug therapy: Secondary | ICD-10-CM

## 2023-01-25 DIAGNOSIS — D638 Anemia in other chronic diseases classified elsewhere: Secondary | ICD-10-CM | POA: Diagnosis present

## 2023-01-25 DIAGNOSIS — F17210 Nicotine dependence, cigarettes, uncomplicated: Secondary | ICD-10-CM | POA: Diagnosis present

## 2023-01-25 DIAGNOSIS — I5033 Acute on chronic diastolic (congestive) heart failure: Secondary | ICD-10-CM | POA: Diagnosis not present

## 2023-01-25 DIAGNOSIS — E662 Morbid (severe) obesity with alveolar hypoventilation: Secondary | ICD-10-CM | POA: Diagnosis present

## 2023-01-25 DIAGNOSIS — E039 Hypothyroidism, unspecified: Secondary | ICD-10-CM | POA: Diagnosis present

## 2023-01-25 DIAGNOSIS — Z7984 Long term (current) use of oral hypoglycemic drugs: Secondary | ICD-10-CM

## 2023-01-25 DIAGNOSIS — J449 Chronic obstructive pulmonary disease, unspecified: Secondary | ICD-10-CM | POA: Diagnosis present

## 2023-01-25 DIAGNOSIS — E785 Hyperlipidemia, unspecified: Secondary | ICD-10-CM | POA: Diagnosis present

## 2023-01-25 DIAGNOSIS — D6959 Other secondary thrombocytopenia: Secondary | ICD-10-CM | POA: Diagnosis present

## 2023-01-25 DIAGNOSIS — Z515 Encounter for palliative care: Secondary | ICD-10-CM

## 2023-01-25 DIAGNOSIS — E871 Hypo-osmolality and hyponatremia: Secondary | ICD-10-CM | POA: Diagnosis present

## 2023-01-25 DIAGNOSIS — J690 Pneumonitis due to inhalation of food and vomit: Secondary | ICD-10-CM | POA: Diagnosis present

## 2023-01-25 DIAGNOSIS — I890 Lymphedema, not elsewhere classified: Secondary | ICD-10-CM | POA: Diagnosis present

## 2023-01-25 DIAGNOSIS — M879 Osteonecrosis, unspecified: Secondary | ICD-10-CM | POA: Diagnosis present

## 2023-01-25 DIAGNOSIS — R0602 Shortness of breath: Secondary | ICD-10-CM | POA: Diagnosis not present

## 2023-01-25 DIAGNOSIS — Z9981 Dependence on supplemental oxygen: Secondary | ICD-10-CM

## 2023-01-25 DIAGNOSIS — Z85828 Personal history of other malignant neoplasm of skin: Secondary | ICD-10-CM

## 2023-01-25 DIAGNOSIS — F32A Depression, unspecified: Secondary | ICD-10-CM | POA: Diagnosis present

## 2023-01-25 DIAGNOSIS — Z8249 Family history of ischemic heart disease and other diseases of the circulatory system: Secondary | ICD-10-CM

## 2023-01-25 DIAGNOSIS — Z8619 Personal history of other infectious and parasitic diseases: Secondary | ICD-10-CM

## 2023-01-25 DIAGNOSIS — J9622 Acute and chronic respiratory failure with hypercapnia: Secondary | ICD-10-CM | POA: Diagnosis present

## 2023-01-25 DIAGNOSIS — Z923 Personal history of irradiation: Secondary | ICD-10-CM

## 2023-01-25 DIAGNOSIS — K7460 Unspecified cirrhosis of liver: Secondary | ICD-10-CM | POA: Diagnosis present

## 2023-01-25 DIAGNOSIS — Z880 Allergy status to penicillin: Secondary | ICD-10-CM

## 2023-01-25 DIAGNOSIS — Z1152 Encounter for screening for COVID-19: Secondary | ICD-10-CM

## 2023-01-25 DIAGNOSIS — R5383 Other fatigue: Secondary | ICD-10-CM | POA: Diagnosis present

## 2023-01-25 DIAGNOSIS — Z833 Family history of diabetes mellitus: Secondary | ICD-10-CM

## 2023-01-25 DIAGNOSIS — I110 Hypertensive heart disease with heart failure: Principal | ICD-10-CM | POA: Diagnosis present

## 2023-01-25 DIAGNOSIS — Z993 Dependence on wheelchair: Secondary | ICD-10-CM

## 2023-01-25 DIAGNOSIS — J9621 Acute and chronic respiratory failure with hypoxia: Secondary | ICD-10-CM | POA: Diagnosis present

## 2023-01-25 DIAGNOSIS — Z9049 Acquired absence of other specified parts of digestive tract: Secondary | ICD-10-CM

## 2023-01-25 DIAGNOSIS — Z885 Allergy status to narcotic agent status: Secondary | ICD-10-CM

## 2023-01-25 DIAGNOSIS — Z888 Allergy status to other drugs, medicaments and biological substances status: Secondary | ICD-10-CM

## 2023-01-25 DIAGNOSIS — Z66 Do not resuscitate: Secondary | ICD-10-CM | POA: Diagnosis not present

## 2023-01-25 DIAGNOSIS — Z6841 Body Mass Index (BMI) 40.0 and over, adult: Secondary | ICD-10-CM

## 2023-01-25 DIAGNOSIS — Z7901 Long term (current) use of anticoagulants: Secondary | ICD-10-CM

## 2023-01-25 DIAGNOSIS — F419 Anxiety disorder, unspecified: Secondary | ICD-10-CM | POA: Diagnosis present

## 2023-01-25 DIAGNOSIS — I480 Paroxysmal atrial fibrillation: Secondary | ICD-10-CM | POA: Diagnosis present

## 2023-01-25 LAB — COMPREHENSIVE METABOLIC PANEL WITH GFR
ALT: 14 U/L (ref 0–44)
AST: 20 U/L (ref 15–41)
Albumin: 2.8 g/dL — ABNORMAL LOW (ref 3.5–5.0)
Alkaline Phosphatase: 34 U/L — ABNORMAL LOW (ref 38–126)
Anion gap: 15 (ref 5–15)
BUN: 21 mg/dL (ref 8–23)
CO2: 33 mmol/L — ABNORMAL HIGH (ref 22–32)
Calcium: 8.5 mg/dL — ABNORMAL LOW (ref 8.9–10.3)
Chloride: 85 mmol/L — ABNORMAL LOW (ref 98–111)
Creatinine, Ser: 0.97 mg/dL (ref 0.44–1.00)
GFR, Estimated: >60 mL/min
Glucose, Bld: 92 mg/dL (ref 70–99)
Potassium: 4.2 mmol/L (ref 3.5–5.1)
Sodium: 133 mmol/L — ABNORMAL LOW (ref 135–145)
Total Bilirubin: 0.5 mg/dL (ref 0.3–1.2)
Total Protein: 6.8 g/dL (ref 6.5–8.1)

## 2023-01-25 LAB — CBC WITH DIFFERENTIAL/PLATELET
Abs Immature Granulocytes: 0.03 10*3/uL (ref 0.00–0.07)
Basophils Absolute: 0 10*3/uL (ref 0.0–0.1)
Basophils Relative: 0 %
Eosinophils Absolute: 0 10*3/uL (ref 0.0–0.5)
Eosinophils Relative: 0 %
HCT: 23.2 % — ABNORMAL LOW (ref 36.0–46.0)
Hemoglobin: 6.2 g/dL — CL (ref 12.0–15.0)
Immature Granulocytes: 1 %
Lymphocytes Relative: 15 %
Lymphs Abs: 0.8 10*3/uL (ref 0.7–4.0)
MCH: 23 pg — ABNORMAL LOW (ref 26.0–34.0)
MCHC: 26.7 g/dL — ABNORMAL LOW (ref 30.0–36.0)
MCV: 86.2 fL (ref 80.0–100.0)
Monocytes Absolute: 0.4 10*3/uL (ref 0.1–1.0)
Monocytes Relative: 7 %
Neutro Abs: 4.2 10*3/uL (ref 1.7–7.7)
Neutrophils Relative %: 77 %
Platelets: 119 10*3/uL — ABNORMAL LOW (ref 150–400)
RBC: 2.69 MIL/uL — ABNORMAL LOW (ref 3.87–5.11)
RDW: 18.6 % — ABNORMAL HIGH (ref 11.5–15.5)
WBC: 5.5 10*3/uL (ref 4.0–10.5)
nRBC: 0 % (ref 0.0–0.2)

## 2023-01-25 LAB — RESP PANEL BY RT-PCR (RSV, FLU A&B, COVID)  RVPGX2
Influenza A by PCR: NEGATIVE
Influenza B by PCR: NEGATIVE
Resp Syncytial Virus by PCR: NEGATIVE
SARS Coronavirus 2 by RT PCR: NEGATIVE

## 2023-01-25 LAB — MAGNESIUM: Magnesium: 1.7 mg/dL (ref 1.7–2.4)

## 2023-01-25 LAB — BPAM RBC

## 2023-01-25 LAB — BRAIN NATRIURETIC PEPTIDE: B Natriuretic Peptide: 332.5 pg/mL — ABNORMAL HIGH (ref 0.0–100.0)

## 2023-01-25 LAB — PREPARE RBC (CROSSMATCH)

## 2023-01-25 LAB — TROPONIN I (HIGH SENSITIVITY)
Troponin I (High Sensitivity): 5 ng/L (ref <18)
Troponin I (High Sensitivity): 6 ng/L (ref <18)

## 2023-01-25 LAB — TYPE AND SCREEN

## 2023-01-25 MED ORDER — SENNOSIDES-DOCUSATE SODIUM 8.6-50 MG PO TABS
1.0000 | ORAL_TABLET | Freq: Every evening | ORAL | Status: DC | PRN
  Filled 2023-01-25: qty 1

## 2023-01-25 MED ORDER — ACETAMINOPHEN 650 MG RE SUPP: 650.0000 mg | Freq: Four times a day (QID) | RECTAL | Status: DC | PRN

## 2023-01-25 MED ORDER — ALBUTEROL SULFATE (2.5 MG/3ML) 0.083% IN NEBU: 2.5000 mg | INHALATION_SOLUTION | RESPIRATORY_TRACT | Status: DC | PRN

## 2023-01-25 MED ORDER — ESCITALOPRAM OXALATE 10 MG PO TABS
10.0000 mg | ORAL_TABLET | Freq: Every day | ORAL | Status: DC
  Administered 2023-01-26 – 2023-01-27 (×2): 10 mg via ORAL
  Filled 2023-01-25 (×3): qty 1

## 2023-01-25 MED ORDER — IPRATROPIUM-ALBUTEROL 0.5-2.5 (3) MG/3ML IN SOLN
3.0000 mL | Freq: Four times a day (QID) | RESPIRATORY_TRACT | Status: DC
  Administered 2023-01-26 – 2023-01-27 (×7): 3 mL via RESPIRATORY_TRACT
  Filled 2023-01-25 (×7): qty 3

## 2023-01-25 MED ORDER — LEVOTHYROXINE SODIUM 88 MCG PO TABS
88.0000 ug | ORAL_TABLET | Freq: Every day | ORAL | Status: DC
  Administered 2023-01-26 – 2023-01-27 (×2): 88 ug via ORAL
  Filled 2023-01-25 (×3): qty 1

## 2023-01-25 MED ORDER — LORAZEPAM 0.5 MG PO TABS
0.5000 mg | ORAL_TABLET | Freq: Four times a day (QID) | ORAL | Status: DC | PRN
  Administered 2023-01-27: 0.5 mg via ORAL
  Filled 2023-01-25: qty 1

## 2023-01-25 MED ORDER — ESCITALOPRAM OXALATE 10 MG PO TABS: 10.0000 mg | ORAL_TABLET | Freq: Every day | ORAL | Status: DC

## 2023-01-25 MED ORDER — SODIUM CHLORIDE 0.9% IV SOLUTION: Freq: Once | INTRAVENOUS | Status: AC

## 2023-01-25 MED ORDER — FUROSEMIDE 10 MG/ML IJ SOLN
40.0000 mg | Freq: Once | INTRAMUSCULAR | Status: AC
  Administered 2023-01-25: 40 mg via INTRAVENOUS
  Filled 2023-01-25: qty 4

## 2023-01-25 MED ORDER — AMIODARONE HCL 200 MG PO TABS
200.0000 mg | ORAL_TABLET | Freq: Every day | ORAL | Status: DC
  Administered 2023-01-26 – 2023-01-27 (×2): 200 mg via ORAL
  Filled 2023-01-25 (×2): qty 1

## 2023-01-25 MED ORDER — METOPROLOL SUCCINATE ER 25 MG PO TB24
12.5000 mg | ORAL_TABLET | Freq: Every day | ORAL | Status: DC
  Filled 2023-01-25: qty 1

## 2023-01-25 MED ORDER — ACETAMINOPHEN 325 MG PO TABS
650.0000 mg | ORAL_TABLET | Freq: Four times a day (QID) | ORAL | Status: DC | PRN
  Administered 2023-01-27: 650 mg via ORAL
  Filled 2023-01-25 (×3): qty 2

## 2023-01-25 MED ORDER — FUROSEMIDE 10 MG/ML IJ SOLN
40.0000 mg | Freq: Two times a day (BID) | INTRAMUSCULAR | Status: DC
  Administered 2023-01-26 – 2023-01-27 (×3): 40 mg via INTRAVENOUS
  Filled 2023-01-25 (×3): qty 4

## 2023-01-25 MED ORDER — ATORVASTATIN CALCIUM 40 MG PO TABS
40.0000 mg | ORAL_TABLET | Freq: Every day | ORAL | Status: DC
  Administered 2023-01-26 – 2023-01-27 (×2): 40 mg via ORAL
  Filled 2023-01-25 (×2): qty 1

## 2023-01-25 MED ORDER — ONDANSETRON HCL 4 MG/2ML IJ SOLN
4.0000 mg | Freq: Four times a day (QID) | INTRAMUSCULAR | Status: DC | PRN
  Administered 2023-01-26: 4 mg via INTRAVENOUS
  Filled 2023-01-25: qty 2

## 2023-01-25 MED ORDER — ONDANSETRON HCL 4 MG PO TABS: 4.0000 mg | ORAL_TABLET | Freq: Four times a day (QID) | ORAL | Status: DC | PRN

## 2023-01-25 NOTE — ED Provider Notes (Signed)
[place] EMERGENCY DEPARTMENT AT [facility] Provider Note   CSN: [id number] Arrival date & time: 01/25/23  1431     History  Chief Complaint  Patient presents with   Cough   Shortness of Breath    [name] is a 67 y.o. female.  67 year old female with history of atrial fibrillation on Eliquis, HFpEF, OHS, chronic respiratory failure on 5 L nasal cannula, type 2 diabetes, hypertension, hypothyroidism, cirrhosis, carcinoma of the face status post radiation presenting for cough and shortness of breath.  She states for 2 weeks she has had progressive cough and shortness of breath.  She states last week she was given ciprofloxacin for possible pneumonia.  She finished the course without improvement.  She continues to feel bad at home.  She has been taking her medications including her Eliquis and Lasix and had good urine output without hematuria or dysuria.  No abdominal pain.  No significant chest pain, continues to have productive cough with sputum and clear for off.  She states it feels like prior heart failure exacerbations.  States both legs are swollen similar to prior.  No pleuritic pain.  No fevers, some questionable chills.   Cough Associated symptoms: shortness of breath   Shortness of Breath Associated symptoms: cough        Home Medications Prior to Admission medications   Medication Sig Start Date End Date Taking? Authorizing Provider  acetaminophen (TYLENOL) 160 MG/5ML solution Take 20 mLs by mouth as needed for mild pain or moderate pain.   Yes [provider]  albuterol (PROVENTIL) (2.5 MG/3ML) 0.083% nebulizer solution Take by nebulization. 12/05/22  Yes [provider]  albuterol (VENTOLIN HFA) 108 (90 Base) MCG/ACT inhaler Inhale into the lungs. 12/05/22  Yes [provider]  amiodarone (PACERONE) 200 MG tablet Take one tablet twice daily until 04/08/21, then change to one tablet daily starting on 04/09/21. Patient taking  differently: Take 200 mg by mouth daily. 03/27/21  Yes Arrien, York Ram, MD  apixaban (ELIQUIS) 5 MG TABS tablet Take 5 mg by mouth 2 (two) times daily.   Yes [provider]  atorvastatin (LIPITOR) 40 MG tablet Take 40 mg by mouth daily.   Yes [provider]  escitalopram (LEXAPRO) 10 MG tablet TAKE 1 TABLET BY MOUTH EVERY DAY. Need TO keep appointment in June FOR future refills Patient taking differently: Take 10 mg by mouth daily. 12/13/19  Yes Grayce Sessions, NP  ferrous sulfate 325 (65 FE) MG EC tablet Take 1 tablet (325 mg total) by mouth 2 (two) times daily. 12/06/22 01/25/23 Yes Azucena Fallen, MD  furosemide (LASIX) 40 MG tablet Take 40 mg by mouth daily.   Yes [provider]  levothyroxine (SYNTHROID) 88 MCG tablet Take 88 mcg by mouth daily. 06/08/22  Yes [provider]  metFORMIN (GLUCOPHAGE-XR) 500 MG 24 hr tablet Take 1,000 mg by mouth daily with breakfast. 09/11/22  Yes [provider]  metoprolol succinate (TOPROL-XL) 25 MG 24 hr tablet TAKE 1/2 TABLET BY MOUTH EVERY DAY 12/01/22  Yes Chilton Si, MD  omeprazole (PRILOSEC) 40 MG capsule Take 1 capsule (40 mg total) by mouth daily as needed (acid reflux). Patient taking differently: Take 40 mg by mouth daily. 10/16/19  Yes Grayce Sessions, NP  potassium chloride SA (KLOR-CON M) 20 MEQ tablet Take 20 mEq by mouth every evening.   Yes [provider]  sodium chloride (OCEAN) 0.65 % SOLN nasal spray Place 1 spray into  both nostrils as needed for congestion.   Yes [provider]  acetaminophen (TYLENOL) 325 MG tablet Take 2 tablets (650 mg total) by mouth every 6 (six) hours as needed for mild pain (or Fever >/= 101). Patient not taking: Reported on 01/25/2023 03/27/21   Arrien, York Ram, MD  benzonatate (TESSALON) 200 MG capsule Take 1 capsule by mouth 3 (three) times daily. Patient not taking: Reported on 01/25/2023    [provider]   ciprofloxacin (CIPRO) 500 MG tablet Take 500 mg by mouth 2 (two) times daily. Patient not taking: Reported on 01/25/2023    [provider]      Allergies    Penicillins, Demerol, Meperidine, and Morphine and codeine    Review of Systems   Review of Systems  Constitutional:  Positive for fatigue.  Respiratory:  Positive for cough and shortness of breath.     Physical Exam Updated Vital Signs BP (!) 104/51 (BP Location: Left Arm)   Pulse 85   Temp 97.8 F (36.6 C) (Oral)   Resp 18   Ht 5' 7.5" (1.715 m)   Wt (!) 165.1 kg   SpO2 95%   BMI 56.17 kg/m  Physical Exam Vitals and nursing note reviewed.  Constitutional:      Appearance: She is ill-appearing.  HENT:     Head: Normocephalic and atraumatic.  Eyes:     Extraocular Movements: Extraocular movements intact.     Conjunctiva/sclera: Conjunctivae normal.     Pupils: Pupils are equal, round, and reactive to light.  Neck:     Vascular: JVD present.  Cardiovascular:     Rate and Rhythm: Normal rate and regular rhythm.     Heart sounds: No murmur heard. Pulmonary:     Effort: Tachypnea present. No respiratory distress.     Breath sounds: Examination of the right-lower field reveals rales. Examination of the left-lower field reveals rales. Decreased breath sounds and rales present.  Abdominal:     Palpations: Abdomen is soft.     Tenderness: There is no abdominal tenderness.  Musculoskeletal:        General: No swelling.     Cervical back: Neck supple.     Right lower leg: Edema present.     Left lower leg: Edema present.  Skin:    General: Skin is warm and dry.     Capillary Refill: Capillary refill takes less than 2 seconds.  Neurological:     Mental Status: She is alert.  Psychiatric:        Mood and Affect: Mood normal.     ED Results / Procedures / Treatments   Labs (all labs ordered are listed, but only abnormal results are displayed) Labs Reviewed  CBC WITH DIFFERENTIAL/PLATELET - Abnormal;  Notable for the following components:      Result Value   RBC 2.69 (*)    Hemoglobin 6.2 (*)    HCT 23.2 (*)    MCH 23.0 (*)    MCHC 26.7 (*)    RDW 18.6 (*)    Platelets 119 (*)    All other components within normal limits  COMPREHENSIVE METABOLIC PANEL - Abnormal; Notable for the following components:   Sodium 133 (*)    Chloride 85 (*)    CO2 33 (*)    Calcium 8.5 (*)    Albumin 2.8 (*)    Alkaline Phosphatase 34 (*)    All other components within normal limits  BRAIN NATRIURETIC PEPTIDE - Abnormal; Notable for the  following components:   B Natriuretic Peptide 332.5 (*)    All other components within normal limits  RESP PANEL BY RT-PCR (RSV, FLU A&B, COVID)  RVPGX2  MAGNESIUM  CBC  COMPREHENSIVE METABOLIC PANEL  MAGNESIUM  PREPARE RBC (CROSSMATCH)  TYPE AND SCREEN  TROPONIN I (HIGH SENSITIVITY)  TROPONIN I (HIGH SENSITIVITY)    EKG EKG Interpretation  Date/Time:  Monday Jan 25 2023 14:44:36 EDT Ventricular Rate:  86 PR Interval:    QRS Duration: 106 QT Interval:  395 QTC Calculation: 481 R Axis:   43 Text Interpretation: Atrial fibrillation Baseline wander TECHNICALLY DIFFICULT Otherwise no significant change Confirmed by Melene Plan [phone]) on 01/25/2023 2:57:32 PM  Radiology DG CHEST PORT 1 VIEW  Result Date: 01/25/2023 CLINICAL DATA:  Shortness of breath. EXAM: PORTABLE CHEST 1 VIEW COMPARISON:  12/05/2022 FINDINGS: Heart is enlarged, stable in configuration. Mildly prominent interstitial markings are similar to prior study. No new consolidations or pleural effusions. Prominent pulmonary artery segments again noted. IMPRESSION: 1. No active disease. 2. Stable cardiomegaly. 3. Prominent pulmonary artery segments. Electronically Signed   By: Norva Pavlov M.D.   On: 01/25/2023 16:50    Procedures Procedures    Medications Ordered in ED Medications  0.9 %  sodium chloride infusion (Manually program via Guardrails IV Fluids) (has no administration in time  range)  acetaminophen (TYLENOL) tablet 650 mg (has no administration in time range)    Or  acetaminophen (TYLENOL) suppository 650 mg (has no administration in time range)  senna-docusate (Senokot-S) tablet 1 tablet (has no administration in time range)  ondansetron (ZOFRAN) tablet 4 mg (has no administration in time range)    Or  ondansetron (ZOFRAN) injection 4 mg (has no administration in time range)  albuterol (PROVENTIL) (2.5 MG/3ML) 0.083% nebulizer solution 2.5 mg (has no administration in time range)  ipratropium-albuterol (DUONEB) 0.5-2.5 (3) MG/3ML nebulizer solution 3 mL (3 mLs Nebulization Not Given 01/25/23 2000)  furosemide (LASIX) injection 40 mg (has no administration in time range)  amiodarone (PACERONE) tablet 200 mg (has no administration in time range)  atorvastatin (LIPITOR) tablet 40 mg (has no administration in time range)  escitalopram (LEXAPRO) tablet 10 mg (has no administration in time range)  levothyroxine (SYNTHROID) tablet 88 mcg (has no administration in time range)  metoprolol succinate (TOPROL-XL) 24 hr tablet 12.5 mg (has no administration in time range)  furosemide (LASIX) injection 40 mg (40 mg Intravenous Given 01/25/23 1752)    ED Course/ Medical Decision Making/ A&P Clinical Course as of 01/25/23 2337  Mon Jan 25, 2023  1519 EKG atrial fibrillation, rate 66, slight ST depression in inferior lateral leads. [JD]    Clinical Course User Index [JD] Fulton Reek, MD                             Medical Decision Making Amount and/or Complexity of Data Reviewed Labs: ordered. Radiology: ordered.  Risk Prescription drug management. Decision regarding hospitalization.   67 year old female presenting for cough, shortness of breath for differential including pneumonia, heart failure exacerbation.  Appears volume overloaded on exam.  Will obtain chest x-ray, lab workup.  No frank chest pain, low concern for ACS.  She is adherent to her Eliquis  regimen, less consistent with PE.  X-ray independently reviewed, shows cardiomegaly, prominent pulmonary vasculature.  Labs notable for elevated BNP compared to prior, baseline renal function.  CBC with hgb 6.2, suspect acute on chronic anemia.  She is no  signs of active blood loss including no symptoms of GI bleeding.  Consent was obtained for blood transfusion, 1 unit of packed red cells was ordered.  Under evaluation she still appears euvolemic and has intermittent tachypnea.  I suspect this is related to heart failure exacerbation.  IV Lasix ordered.  Given symptomatic heart for exacerbation as well as anemia requiring transfusion, I discussed the patient with the hospitalist service and she was admitted for further management.        Final Clinical Impression(s) / ED Diagnoses Final diagnoses:  None    Rx / DC Orders ED Discharge Orders     None         Fulton Reek, MD 01/25/23 2337    Melene Plan, DO 01/26/23 1506

## 2023-01-25 NOTE — Progress Notes (Signed)
TRH night cross cover note:   I was notified by RN of the patient's request for resumption of her home antianxiety medication.  Per my brief chart review, it appears that she is on Lexapro at home, which had previously been reordered, with next dose to occur in the morning.  I subsequently retimed this order so that her first dose of Lexapro occurs now.  Additionally, I placed order for as needed p.o. Ativan for anxiety.    Newton Pigg, DO Hospitalist

## 2023-01-25 NOTE — H&P (Signed)
History and Physical    [name] GNF:[id number] DOB: September 18, 1955 DOA: 01/25/2023  PCP: Annita Brod, MD   Patient coming from: Home  I have personally briefly reviewed patient's old medical records in [facility]  Chief Complaint: Cough and shortness of breath  HPI: [name] is a 67 y.o. female with medical history significant of 67 y.o. female with medical history significant for PAF on Eliquis, HFpEF, OHS, chronic hypoxic respiratory failure on 5 L [place], T2DM, HTN, hypothyroidism, hepatic cirrhosis, squamous cell cancer of the face s/p radiation, avascular necrosis of right hip with chronic wheelchair-bound status, last admission from 12/05/2022-12/06/2022 for symptomatic anemia requiring transfusion presented with worsening cough and shortness of breath for the last 2 weeks.  She apparently was given ciprofloxacin for possible pneumonia last week, finished a course without any improvement.  Patient continued to feel bad at home with progressively worsening shortness of breath, mostly exertional.  Patient is on Eliquis and Lasix at home and is having good urine output without any hematuria or dysuria.  Denies worsening abdominal pain, fever, diarrhea, chest pain, loss of consciousness, seizures.  Continues to have productive cough with mostly clear sputum on and off.  Also complains of increasing leg swelling.  ED Course: Hemoglobin was 6.2 she appeared fluid overloaded.  Chest x-ray showed cardiomegaly.  BNP of 332.5.  She was given IV Lasix.  1 unit packed red cells transfusion has been ordered. Hospitalist service was called to evaluate the patient.  Review of Systems: As per HPI otherwise all other systems were reviewed and are negative.   Past Medical History:  Diagnosis Date   Anemia    Arthritis    AVN (avascular necrosis of bone) (HCC) 04/14/2013   Chronic kidney disease    Complication of anesthesia    Decreased mobility    hx. Rt. hip "avascular necrosis" -unable  to weight bear long periods, "using wheelchair"..   Depression    GERD (gastroesophageal reflux disease)    Hep B w/o coma    Hep C w/o coma, chronic (HCC)    07-20-14 being presently tx. "[facility]"-good response per pt. will complete in 7days- Dr. Luciana Axe follows [facility].   Hypertension    Hypothyroid    mild- no medication   Obesity    Osteoporosis    PAF (paroxysmal atrial fibrillation) (HCC) 03/22/2021   Pneumonia    PONV (postoperative nausea and vomiting)    only with Demerol   Shortness of breath dyspnea    still smokes   Superficial thrombophlebitis    left leg   Transfusion history    70's, 80's ? hepatitis C attributed to past transfusions    Past Surgical History:  Procedure Laterality Date   ABDOMINAL HYSTERECTOMY     APPENDECTOMY     thinks it was removed with hysterectomy   arthroscopies     knee-right x 7   CARDIOVERSION N/A 03/26/2021   Procedure: CARDIOVERSION;  Surgeon: Jake Bathe, MD;  Location: [facility] ENDOSCOPY;  Service: Cardiovascular;  Laterality: N/A;   CHOLECYSTECTOMY     COLON SURGERY     COLONOSCOPY     COLONOSCOPY WITH PROPOFOL N/A 02/26/2015   Procedure: COLONOSCOPY WITH PROPOFOL;  Surgeon: Louis Meckel, MD;  Location: [facility] ENDOSCOPY;  Service: Endoscopy;  Laterality: N/A;   COLONOSCOPY WITH PROPOFOL N/A 03/03/2016   Procedure: COLONOSCOPY WITH PROPOFOL;  Surgeon: Napoleon Form, MD;  Location: [facility] ENDOSCOPY;  Service: Endoscopy;  Laterality: N/A;  ESOPHAGOGASTRODUODENOSCOPY (EGD) WITH PROPOFOL N/A 03/03/2016   Procedure: ESOPHAGOGASTRODUODENOSCOPY (EGD) WITH PROPOFOL;  Surgeon: Napoleon Form, MD;  Location: [facility] ENDOSCOPY;  Service: Endoscopy;  Laterality: N/A;   JOINT REPLACEMENT     right knee   TEE WITHOUT CARDIOVERSION N/A 03/26/2021   Procedure: TRANSESOPHAGEAL ECHOCARDIOGRAM (TEE);  Surgeon: Jake Bathe, MD;  Location: [facility] ENDOSCOPY;  Service: Cardiovascular;  Laterality: N/A;   TOTAL KNEE ARTHROPLASTY        reports that she has been smoking cigarettes. She has a 10.00 pack-year smoking history. She has never used smokeless tobacco. She reports that she does not drink alcohol and does not use drugs.  Allergies  Allergen Reactions   Penicillins Anaphylaxis and Other (See Comments)    Has patient had a PCN reaction causing immediate rash, facial/tongue/throat swelling, SOB or lightheadedness with hypotension: Yes Has patient had a PCN reaction causing severe rash involving mucus membranes or skin necrosis: No Has patient had a PCN reaction that required hospitalization: Yes Has patient had a PCN reaction occurring within the last 10 years: No If all of the above answers are "NO", then may proceed with Cephalosporin use.    Demerol Nausea And Vomiting   Meperidine Nausea And Vomiting   Morphine And Codeine Nausea And Vomiting    Family History  Problem Relation Age of Onset   Cancer Mother    Diabetes Mother    Heart disease Mother    Hypertension Mother    Cancer Father    Hypertension Sister    Hypertension Brother    Diabetes Brother    Colon cancer Neg Hx     Prior to Admission medications   Medication Sig Start Date End Date Taking? Authorizing Provider  acetaminophen (TYLENOL) 325 MG tablet Take 2 tablets (650 mg total) by mouth every 6 (six) hours as needed for mild pain (or Fever >/= 101). 03/27/21   Arrien, York Ram, MD  albuterol (PROVENTIL) (2.5 MG/3ML) 0.083% nebulizer solution Take by nebulization. 12/05/22   [provider]  albuterol (VENTOLIN HFA) 108 (90 Base) MCG/ACT inhaler Inhale into the lungs. 12/05/22   [provider]  amiodarone (PACERONE) 200 MG tablet Take one tablet twice daily until 04/08/21, then change to one tablet daily starting on 04/09/21. Patient taking differently: Take 200 mg by mouth daily. 03/27/21   Arrien, York Ram, MD  apixaban (ELIQUIS) 5 MG TABS tablet Take 5 mg by mouth 2 (two) times daily.    [provider]  atorvastatin (LIPITOR) 40 MG tablet Take 40 mg by mouth daily.    [provider]  escitalopram (LEXAPRO) 10 MG tablet TAKE 1 TABLET BY MOUTH EVERY DAY. Need TO keep appointment in June FOR future refills 12/13/19   Grayce Sessions, NP  ferrous sulfate 325 (65 FE) MG EC tablet Take 1 tablet (325 mg total) by mouth 2 (two) times daily. 12/06/22 01/05/23  Azucena Fallen, MD  furosemide (LASIX) 40 MG tablet Take 40 mg by mouth daily.    [provider]  levothyroxine (SYNTHROID) 88 MCG tablet Take 88 mcg by mouth daily. 06/08/22   [provider]  metFORMIN (GLUCOPHAGE-XR) 500 MG 24 hr tablet Take 1,000 mg by mouth daily with breakfast. 09/11/22   [provider]  metoprolol succinate (TOPROL-XL) 25 MG 24 hr tablet TAKE 1/2 TABLET BY MOUTH EVERY DAY 12/01/22   Chilton Si, MD  omeprazole (PRILOSEC) 40 MG capsule Take 1 capsule (40 mg total) by mouth daily as needed (  acid reflux). Patient taking differently: Take 40 mg by mouth daily. 10/16/19   Grayce Sessions, NP  potassium chloride SA (KLOR-CON M) 20 MEQ tablet Take 20 mEq by mouth every evening.    [provider]    Physical Exam: Vitals:   01/25/23 1445 01/25/23 1445 01/25/23 1607  BP:  127/73 103/68  Pulse:  92 100  Resp:  (!) 26 (!) 22  Temp:  97.7 F (36.5 C)   TempSrc:  Oral   SpO2:  93% 95%  Weight: (!) 165.1 kg    Height: 5' 7.5" (1.715 m)      Constitutional: NAD, calm, comfortable.  Looks chronically ill and deconditioned.  Currently on 5 L oxygen via nasal cannula. Vitals:   01/25/23 1445 01/25/23 1445 01/25/23 1607  BP:  127/73 103/68  Pulse:  92 100  Resp:  (!) 26 (!) 22  Temp:  97.7 F (36.5 C)   TempSrc:  Oral   SpO2:  93% 95%  Weight: (!) 165.1 kg    Height: 5' 7.5" (1.715 m)     Eyes: PERRL, lids and conjunctivae normal ENMT: Mucous membranes are moist. Posterior pharynx clear of any exudate or lesions. Neck: normal, supple, no masses, no  thyromegaly Respiratory: bilateral decreased breath sounds at bases, no wheezing; scattered crackles heard.  Normal respiratory effort. No accessory muscle use.  Cardiovascular: S1 S2 positive, rate controlled.  Bilateral lower extremity edema present.  2+ pedal pulses.  Abdomen: Morbidly obese, no tenderness, no masses palpated. No hepatosplenomegaly. Bowel sounds positive.  Musculoskeletal: no clubbing / cyanosis. No joint deformity upper and lower extremities.  Skin: no rashes, lesions, ulcers. No induration Neurologic: CN 2-12 grossly intact. Moving extremities. No focal neurologic deficits.  Psychiatric: Flat affect.  Agitated.  Labs on Admission: I have personally reviewed following labs and imaging studies  CBC: Recent Labs  Lab 01/25/23 1530  WBC 5.5  NEUTROABS 4.2  HGB 6.2*  HCT 23.2*  MCV 86.2  PLT 119*   Basic Metabolic Panel: Recent Labs  Lab 01/25/23 1530  NA 133*  K 4.2  CL 85*  CO2 33*  GLUCOSE 92  BUN 21  CREATININE 0.97  CALCIUM 8.5*  MG 1.7   GFR: Estimated Creatinine Clearance: 90.9 mL/min (by C-G formula based on SCr of 0.97 mg/dL). Liver Function Tests: Recent Labs  Lab 01/25/23 1530  AST 20  ALT 14  ALKPHOS 34*  BILITOT 0.5  PROT 6.8  ALBUMIN 2.8*   No results for input(s): "LIPASE", "AMYLASE" in the last 168 hours. No results for input(s): "AMMONIA" in the last 168 hours. Coagulation Profile: No results for input(s): "INR", "PROTIME" in the last 168 hours. Cardiac Enzymes: No results for input(s): "CKTOTAL", "CKMB", "CKMBINDEX", "TROPONINI" in the last 168 hours. BNP (last 3 results) No results for input(s): "PROBNP" in the last 8760 hours. HbA1C: No results for input(s): "HGBA1C" in the last 72 hours. CBG: No results for input(s): "GLUCAP" in the last 168 hours. Lipid Profile: No results for input(s): "CHOL", "HDL", "LDLCALC", "TRIG", "CHOLHDL", "LDLDIRECT" in the last 72 hours. Thyroid Function Tests: No results for input(s):  "TSH", "T4TOTAL", "FREET4", "T3FREE", "THYROIDAB" in the last 72 hours. Anemia Panel: No results for input(s): "VITAMINB12", "FOLATE", "FERRITIN", "TIBC", "IRON", "RETICCTPCT" in the last 72 hours. Urine analysis:    Component Value Date/Time   COLORURINE STRAW (A) 12/05/2022 1908   APPEARANCEUR CLEAR 12/05/2022 1908   LABSPEC 1.004 (L) 12/05/2022 1908   PHURINE 7.0 12/05/2022 1908   GLUCOSEU  NEGATIVE 12/05/2022 1908   HGBUR SMALL (A) 12/05/2022 1908   BILIRUBINUR NEGATIVE 12/05/2022 1908   KETONESUR NEGATIVE 12/05/2022 1908   PROTEINUR NEGATIVE 12/05/2022 1908   UROBILINOGEN 1.0 03/11/2009 0437   NITRITE NEGATIVE 12/05/2022 1908   LEUKOCYTESUR NEGATIVE 12/05/2022 1908    Radiological Exams on Admission: DG CHEST PORT 1 VIEW  Result Date: 01/25/2023 CLINICAL DATA:  Shortness of breath. EXAM: PORTABLE CHEST 1 VIEW COMPARISON:  12/05/2022 FINDINGS: Heart is enlarged, stable in configuration. Mildly prominent interstitial markings are similar to prior study. No new consolidations or pleural effusions. Prominent pulmonary artery segments again noted. IMPRESSION: 1. No active disease. 2. Stable cardiomegaly. 3. Prominent pulmonary artery segments. Electronically Signed   By: Norva Pavlov M.D.   On: 01/25/2023 16:50     Assessment/Plan  Acute on chronic diastolic heart failure -Presented with worsening shortness of breath with increased leg swelling and appears to be in volume overload.  Chest x-ray as above.  IV Lasix has been ordered in the ED. -Continue Lasix 40 mg IV twice daily.  Strict input and output.  Daily weights.  Fluid restriction.  Echo on 12/06/2022 had shown EF of 55 to 60% -Outpatient follow-up with cardiology  Acute on chronic anemia -Presented with hemoglobin of 6.2.  Hemoglobin on 12/06/2022 was 7.4. -Possibly chronic anemia from chronic illnesses including heart failure/cirrhosis of liver. -Transfuse 1 unit packed red cells.  Hold Eliquis.  Monitor H&H.  Chronic  respiratory failure with hypoxia -On 5 L oxygen via nasal cannula at home.  Currently on the same.  Monitor.  Paroxysmal A-fib -Currently rate controlled.  Will resume home regimen once verified including amiodarone and Toprol-XL.  Eliquis on hold.  Essential hypertension Hyperlipidemia -Blood pressure on the lower side.  Monitor -Resume statin once dose is verified  Depression with anxiety -Resume home regimen once home medications verified  Hypothyroidism -Resume Synthroid once home regimen is verified  Cirrhosis of liver -Stable.  Outpatient follow-up with GI  Thrombocytopenia, chronic -Possibly from cirrhosis of liver.  Monitor platelets.  Goals of care -Patient is full code.  Overall prognosis is guarded to poor.  Consult palliative care    DVT prophylaxis: SCDs Code Status: Full Family Communication: Sister and niece at bedside Disposition Plan: Home in 1 to 2 days once clinically improves Consults called: None Admission status: Observation/telemetry  Severity of Illness: The appropriate patient status for this patient is OBSERVATION. Observation status is judged to be reasonable and necessary in order to provide the required intensity of service to ensure the patient's safety. The patient's presenting symptoms, physical exam findings, and initial radiographic and laboratory data in the context of their medical condition is felt to place them at decreased risk for further clinical deterioration. Furthermore, it is anticipated that the patient will be medically stable for discharge from the hospital within 2 midnights of admission.     Glade Lloyd MD [facility]  01/25/2023, 5:49 PM

## 2023-01-25 NOTE — Progress Notes (Signed)
Pt. Requesting medication for anxiety medication. On call for [facility] paged to make aware.

## 2023-01-25 NOTE — ED Notes (Signed)
This NT helped pt to use bedside and changed bed linen.

## 2023-01-25 NOTE — ED Notes (Signed)
[place] [phone]

## 2023-01-25 NOTE — ED Notes (Signed)
ED TO INPATIENT HANDOFF REPORT  ED Nurse Name and Phone #: Clista Bernhardt Name/Age/Gender [name] 67 y.o. female Room/Bed: 022C/022C  Code Status   Code Status: Full Code  Home/SNF/Other Home Patient oriented to: self, place, time, and situation Is this baseline? Yes   Triage Complete: Triage complete  Chief Complaint CHF exacerbation [facility]) [I50.9]  Triage Note Pt brought in by EMS from home with complaints of SOB x2 weeks that is not getting better and progressing to generalized weakness. Pt also endorses cough/congestion with thick clear mucus.   Pt wears 5L [place] at home and reports she was told by her doctor if her oxygen is above 84% she is 'good'. Pt was 90% upon EMS arrival and 93% upon arrival to the ED  Pt also reports that her husband is sick and she is concerned they both have something viral and/or Covid. Pt is requesting to be tested   Allergies Allergies  Allergen Reactions   Penicillins Anaphylaxis and Other (See Comments)    Has patient had a PCN reaction causing immediate rash, facial/tongue/throat swelling, SOB or lightheadedness with hypotension: Yes Has patient had a PCN reaction causing severe rash involving mucus membranes or skin necrosis: No Has patient had a PCN reaction that required hospitalization: Yes Has patient had a PCN reaction occurring within the last 10 years: No If all of the above answers are "NO", then may proceed with Cephalosporin use.    Demerol Nausea And Vomiting   Meperidine Nausea And Vomiting   Morphine And Codeine Nausea And Vomiting    Level of Care/Admitting Diagnosis ED Disposition     ED Disposition  Admit   Condition  --   Comment  Hospital Area: [facility] [[id number]]  Level of Care: Telemetry Cardiac [103]  May place patient in observation at [facility] or [facility] if equivalent level of care is available:: Yes  Covid Evaluation: Asymptomatic - no recent exposure (last 10 days)  testing not required  Diagnosis: CHF exacerbation [facility]) [[id number]]  Admitting Physician: Glade Lloyd [[id number]]  Attending Physician: Glade Lloyd [[id number]]          B Medical/Surgery History Past Medical History:  Diagnosis Date   Anemia    Arthritis    AVN (avascular necrosis of bone) (HCC) 04/14/2013   Chronic kidney disease    Complication of anesthesia    Decreased mobility    hx. Rt. hip "avascular necrosis" -unable to weight bear long periods, "using wheelchair"..   Depression    GERD (gastroesophageal reflux disease)    Hep B w/o coma    Hep C w/o coma, chronic (HCC)    07-20-14 being presently tx. "Harvoni"-good response per pt. will complete in 7days- Dr. Luciana Axe follows [place] [facility].   Hypertension    Hypothyroid    mild- no medication   Obesity    Osteoporosis    PAF (paroxysmal atrial fibrillation) (HCC) 03/22/2021   Pneumonia    PONV (postoperative nausea and vomiting)    only with Demerol   Shortness of breath dyspnea    still smokes   Superficial thrombophlebitis    left leg   Transfusion history    70's, 80's ? hepatitis C attributed to past transfusions   Past Surgical History:  Procedure Laterality Date   ABDOMINAL HYSTERECTOMY     APPENDECTOMY     thinks it was removed with hysterectomy   arthroscopies     knee-right x 7  CARDIOVERSION N/A 03/26/2021   Procedure: CARDIOVERSION;  Surgeon: Jake Bathe, MD;  [facility]: [facility] ENDOSCOPY;  Service: Cardiovascular;  Laterality: N/A;   CHOLECYSTECTOMY     COLON SURGERY     COLONOSCOPY     COLONOSCOPY WITH PROPOFOL N/A 02/26/2015   Procedure: COLONOSCOPY WITH PROPOFOL;  Surgeon: Louis Meckel, MD;  Location: [facility] ENDOSCOPY;  Service: Endoscopy;  Laterality: N/A;   COLONOSCOPY WITH PROPOFOL N/A 03/03/2016   Procedure: COLONOSCOPY WITH PROPOFOL;  Surgeon: Napoleon Form, MD;  Location: [facility] ENDOSCOPY;  Service: Endoscopy;  Laterality: N/A;   ESOPHAGOGASTRODUODENOSCOPY (EGD)  WITH PROPOFOL N/A 03/03/2016   Procedure: ESOPHAGOGASTRODUODENOSCOPY (EGD) WITH PROPOFOL;  Surgeon: Napoleon Form, MD;  Location: [facility] ENDOSCOPY;  Service: Endoscopy;  Laterality: N/A;   JOINT REPLACEMENT     right knee   TEE WITHOUT CARDIOVERSION N/A 03/26/2021   Procedure: TRANSESOPHAGEAL ECHOCARDIOGRAM (TEE);  Surgeon: Jake Bathe, MD;  Location: [facility] ENDOSCOPY;  Service: Cardiovascular;  Laterality: N/A;   TOTAL KNEE ARTHROPLASTY       A IV Location/Drains/Wounds Patient Lines/Drains/Airways Status     Active Line/Drains/Airways     Name Placement date Placement time Site Days   Peripheral IV 01/25/23 18 G Right Antecubital 01/25/23  --  Antecubital  less than 1   Pressure Injury 12/05/22 Buttocks Right;Left;Posterior Deep Tissue Pressure Injury - Purple or maroon localized area of discolored intact skin or blood-filled blister due to damage of underlying soft tissue from pressure and/or shear. purple and maroon c 12/05/22  2115  -- 51            Intake/Output Last 24 hours No intake or output data in the 24 hours ending 01/25/23 1808  Labs/Imaging Results for orders placed or performed during the hospital encounter of 01/25/23 (from the past 48 hour(s))  Resp panel by RT-PCR (RSV, Flu A&B, Covid) Anterior Nasal Swab     Status: None   Collection Time: 01/25/23  3:21 PM   Specimen: Anterior Nasal Swab  Result Value Ref Range   SARS Coronavirus 2 by RT PCR NEGATIVE NEGATIVE   Influenza A by PCR NEGATIVE NEGATIVE   Influenza B by PCR NEGATIVE NEGATIVE    Comment: (NOTE) The Xpert Xpress SARS-CoV-2/FLU/RSV plus assay is intended as an aid in the diagnosis of influenza from Nasopharyngeal swab specimens and should not be used as a sole basis for treatment. Nasal washings and aspirates are unacceptable for Xpert Xpress SARS-CoV-2/FLU/RSV testing.  Fact Sheet for Patients: BloggerCourse.com  Fact Sheet for Healthcare  Providers: SeriousBroker.it  This test is not yet approved or cleared by the [place] FDA and has been authorized for detection and/or diagnosis of SARS-CoV-2 by FDA under an Emergency Use Authorization (EUA). This EUA will remain in effect (meaning this test can be used) for the duration of the COVID-19 declaration under Section 564(b)(1) of the Act, 21 U.S.C. section 360bbb-3(b)(1), unless the authorization is terminated or revoked.     Resp Syncytial Virus by PCR NEGATIVE NEGATIVE    Comment: (NOTE) Fact Sheet for Patients: BloggerCourse.com  Fact Sheet for Healthcare Providers: SeriousBroker.it  This test is not yet approved or cleared by the [place] FDA and has been authorized for detection and/or diagnosis of SARS-CoV-2 by FDA under an Emergency Use Authorization (EUA). This EUA will remain in effect (meaning this test can be used) for the duration of the COVID-19 declaration under Section 564(b)(1) of the Act, 21 U.S.C. section 360bbb-3(b)(1), unless the authorization is terminated or revoked.  Performed at [facility], [address] [place], [place] [address]   CBC with Differential     Status: Abnormal   Collection Time: 01/25/23  3:30 PM  Result Value Ref Range   WBC 5.5 4.0 - 10.5 K/uL   RBC 2.69 (L) 3.87 - 5.11 MIL/uL   Hemoglobin 6.2 (LL) 12.0 - 15.0 g/dL    Comment: REPEATED TO VERIFY THIS CRITICAL RESULT HAS VERIFIED AND BEEN CALLED TO B.Audiel Scheiber,RN BY JOHN VANG ON 05 20 2024 AT 1606, AND HAS BEEN READ BACK.     HCT 23.2 (L) 36.0 - 46.0 %   MCV 86.2 80.0 - 100.0 fL   MCH 23.0 (L) 26.0 - 34.0 pg   MCHC 26.7 (L) 30.0 - 36.0 g/dL   RDW 60.4 (H) 54.0 - 98.1 %   Platelets 119 (L) 150 - 400 K/uL   nRBC 0.0 0.0 - 0.2 %   Neutrophils Relative % 77 %   Neutro Abs 4.2 1.7 - 7.7 K/uL   Lymphocytes Relative 15 %   Lymphs Abs 0.8 0.7 - 4.0 K/uL   Monocytes Relative 7 %    Monocytes Absolute 0.4 0.1 - 1.0 K/uL   Eosinophils Relative 0 %   Eosinophils Absolute 0.0 0.0 - 0.5 K/uL   Basophils Relative 0 %   Basophils Absolute 0.0 0.0 - 0.1 K/uL   Immature Granulocytes 1 %   Abs Immature Granulocytes 0.03 0.00 - 0.07 K/uL    Comment: Performed at [facility], [address] [place], [place] [address]  Comprehensive metabolic panel     Status: Abnormal   Collection Time: 01/25/23  3:30 PM  Result Value Ref Range   Sodium 133 (L) 135 - 145 mmol/L   Potassium 4.2 3.5 - 5.1 mmol/L   Chloride 85 (L) 98 - 111 mmol/L   CO2 33 (H) 22 - 32 mmol/L   Glucose, Bld 92 70 - 99 mg/dL    Comment: Glucose reference range applies only to samples taken after fasting for at least 8 hours.   BUN 21 8 - 23 mg/dL   Creatinine, Ser 8.29 0.44 - 1.00 mg/dL   Calcium 8.5 (L) 8.9 - 10.3 mg/dL   Total Protein 6.8 6.5 - 8.1 g/dL   Albumin 2.8 (L) 3.5 - 5.0 g/dL   AST 20 15 - 41 U/L   ALT 14 0 - 44 U/L   Alkaline Phosphatase 34 (L) 38 - 126 U/L   Total Bilirubin 0.5 0.3 - 1.2 mg/dL   GFR, Estimated >56 >21 mL/min    Comment: (NOTE) Calculated using the CKD-EPI Creatinine Equation (2021)    Anion gap 15 5 - 15    Comment: Performed at [facility], [address], [place], [place] [address]  Magnesium     Status: None   Collection Time: 01/25/23  3:30 PM  Result Value Ref Range   Magnesium 1.7 1.7 - 2.4 mg/dL    Comment: Performed at [facility], [address] [place], [place] [address]  Troponin I (High Sensitivity)     Status: None   Collection Time: 01/25/23  3:30 PM  Result Value Ref Range   Troponin I (High Sensitivity) 5 <18 ng/L    Comment: (NOTE) Elevated high sensitivity troponin I (hsTnI) values and significant  changes across serial measurements may suggest ACS but many other  chronic and acute conditions are known to elevate hsTnI results.  Refer to the "Links" section for chest  pain algorithms and additional  guidance. Performed at [facility], [address] [place], [place] [address]   Brain natriuretic peptide     Status: Abnormal   Collection Time: 01/25/23  3:30 PM  Result Value Ref Range   B Natriuretic Peptide 332.5 (H) 0.0 - 100.0 pg/mL    Comment: Performed at [facility], [address], [place], [place] [address]  Prepare RBC (crossmatch)     Status: None   Collection Time: 01/25/23  4:53 PM  Result Value Ref Range   Order Confirmation      ORDER PROCESSED BY BLOOD BANK Performed at [facility], [address], [place], [place] [address]   Type and screen [facility]     Status: None (Preliminary result)   Collection Time: 01/25/23  5:00 PM  Result Value Ref Range   ABO/RH(D) A POS    Antibody Screen NEG    Sample Expiration 01/28/2023,2359    Unit Number [id number]    Blood Component Type RED CELLS,LR    Unit division 00    Status of Unit ALLOCATED    Transfusion Status OK TO TRANSFUSE    Crossmatch Result      Compatible Performed at [facility], [address] [place], [place] [address]    DG CHEST PORT 1 VIEW  Result Date: 01/25/2023 CLINICAL DATA:  Shortness of breath. EXAM: PORTABLE CHEST 1 VIEW COMPARISON:  12/05/2022 FINDINGS: Heart is enlarged, stable in configuration. Mildly prominent interstitial markings are similar to prior study. No new consolidations or pleural effusions. Prominent pulmonary artery segments again noted. IMPRESSION: 1. No active disease. 2. Stable cardiomegaly. 3. Prominent pulmonary artery segments. Electronically Signed   By: Norva Pavlov M.D.   On: 01/25/2023 16:50    Pending Labs Unresulted Labs (From admission, onward)     Start     Ordered   01/26/23 0500  CBC  Tomorrow morning,   R        01/25/23 1748   01/26/23 0500  Comprehensive metabolic panel  Tomorrow morning,   R        01/25/23 1748   01/26/23 0500  Magnesium  Tomorrow morning,   R        01/25/23 1748            Vitals/Pain Today's  Vitals   01/25/23 1445 01/25/23 1445 01/25/23 1607 01/25/23 1755  BP:  127/73 103/68 126/64  Pulse:  92 100 80  Resp:  (!) 26 (!) 22 (!) 22  Temp:  97.7 F (36.5 C)  97.9 F (36.6 C)  TempSrc:  Oral  Oral  SpO2:  93% 95% 98%  Weight: (!) 165.1 kg     Height: 5' 7.5" (1.715 m)     PainSc:        Isolation Precautions No active isolations  Medications Medications  0.9 %  sodium chloride infusion (Manually program via Guardrails IV Fluids) (has no administration in time range)  acetaminophen (TYLENOL) tablet 650 mg (has no administration in time range)    Or  acetaminophen (TYLENOL) suppository 650 mg (has no administration in time range)  senna-docusate (Senokot-S) tablet 1 tablet (has no administration in time range)  ondansetron (ZOFRAN) tablet 4 mg (has no administration in time range)    Or  ondansetron (ZOFRAN) injection 4 mg (has no administration in time range)  albuterol (PROVENTIL) (2.5 MG/3ML) 0.083% nebulizer solution 2.5 mg (has no administration in time  range)  ipratropium-albuterol (DUONEB) 0.5-2.5 (3) MG/3ML nebulizer solution 3 mL (has no administration in time range)  furosemide (LASIX) injection 40 mg (has no administration in time range)  furosemide (LASIX) injection 40 mg (40 mg Intravenous Given 01/25/23 1752)    Mobility walks with device     Focused Assessments Pulmonary Assessment Handoff:  Lung sounds: Bilateral Breath Sounds: Rhonchi L Breath Sounds: Rhonchi R Breath Sounds: Rhonchi O2 Device: Nasal Cannula O2 Flow Rate (L/min): 5 L/min    R Recommendations: See Admitting Provider Note  Report given to:   Additional Notes:

## 2023-01-25 NOTE — ED Triage Notes (Addendum)
Pt brought in by EMS from home with complaints of SOB x2 weeks that is not getting better and progressing to generalized weakness. Pt also endorses cough/congestion with thick clear mucus.   Pt wears 5L  at home and reports she was told by her doctor if her oxygen is above 84% she is 'good'. Pt was 90% upon EMS arrival and 93% upon arrival to the ED  Pt also reports that her husband is sick and she is concerned they both have something viral and/or Covid. Pt is requesting to be tested

## 2023-01-25 NOTE — ED Notes (Signed)
MD Darvin Neighbours about standing purewick order that was placed with admission orders d/t patient having extreme urinary urgency (which is reported for her to be normal d/t her normal home PO lasix) and was asked if that order could be discontinued. Patient has already had three bed changes in the ED and this is all prior to her receiving the IV lasix that was ordered. Awaiting response back from MD. A purewick was placed at this time in the ED due to administering IV lasix and patient already being SOB prior to getting in/out of the bed.

## 2023-01-26 ENCOUNTER — Observation Stay (HOSPITAL_COMMUNITY): Payer: Medicare HMO

## 2023-01-26 DIAGNOSIS — E039 Hypothyroidism, unspecified: Secondary | ICD-10-CM | POA: Diagnosis present

## 2023-01-26 DIAGNOSIS — Z9981 Dependence on supplemental oxygen: Secondary | ICD-10-CM | POA: Diagnosis not present

## 2023-01-26 DIAGNOSIS — I5033 Acute on chronic diastolic (congestive) heart failure: Secondary | ICD-10-CM

## 2023-01-26 DIAGNOSIS — J9621 Acute and chronic respiratory failure with hypoxia: Secondary | ICD-10-CM

## 2023-01-26 DIAGNOSIS — E1122 Type 2 diabetes mellitus with diabetic chronic kidney disease: Secondary | ICD-10-CM | POA: Diagnosis present

## 2023-01-26 DIAGNOSIS — E662 Morbid (severe) obesity with alveolar hypoventilation: Secondary | ICD-10-CM | POA: Diagnosis present

## 2023-01-26 DIAGNOSIS — J690 Pneumonitis due to inhalation of food and vomit: Secondary | ICD-10-CM | POA: Diagnosis present

## 2023-01-26 DIAGNOSIS — I110 Hypertensive heart disease with heart failure: Secondary | ICD-10-CM | POA: Diagnosis present

## 2023-01-26 DIAGNOSIS — Z1152 Encounter for screening for COVID-19: Secondary | ICD-10-CM | POA: Diagnosis not present

## 2023-01-26 DIAGNOSIS — R5383 Other fatigue: Secondary | ICD-10-CM | POA: Diagnosis present

## 2023-01-26 DIAGNOSIS — K7460 Unspecified cirrhosis of liver: Secondary | ICD-10-CM | POA: Diagnosis present

## 2023-01-26 DIAGNOSIS — D638 Anemia in other chronic diseases classified elsewhere: Secondary | ICD-10-CM | POA: Diagnosis present

## 2023-01-26 DIAGNOSIS — Z6841 Body Mass Index (BMI) 40.0 and over, adult: Secondary | ICD-10-CM | POA: Diagnosis not present

## 2023-01-26 DIAGNOSIS — R0602 Shortness of breath: Secondary | ICD-10-CM | POA: Diagnosis present

## 2023-01-26 DIAGNOSIS — F32A Depression, unspecified: Secondary | ICD-10-CM | POA: Diagnosis present

## 2023-01-26 DIAGNOSIS — D6959 Other secondary thrombocytopenia: Secondary | ICD-10-CM | POA: Diagnosis present

## 2023-01-26 DIAGNOSIS — M879 Osteonecrosis, unspecified: Secondary | ICD-10-CM | POA: Diagnosis present

## 2023-01-26 DIAGNOSIS — E785 Hyperlipidemia, unspecified: Secondary | ICD-10-CM | POA: Diagnosis present

## 2023-01-26 DIAGNOSIS — J9622 Acute and chronic respiratory failure with hypercapnia: Secondary | ICD-10-CM | POA: Diagnosis present

## 2023-01-26 DIAGNOSIS — E8729 Other acidosis: Secondary | ICD-10-CM | POA: Diagnosis not present

## 2023-01-26 DIAGNOSIS — I480 Paroxysmal atrial fibrillation: Secondary | ICD-10-CM | POA: Diagnosis present

## 2023-01-26 DIAGNOSIS — Z515 Encounter for palliative care: Secondary | ICD-10-CM

## 2023-01-26 DIAGNOSIS — J449 Chronic obstructive pulmonary disease, unspecified: Secondary | ICD-10-CM | POA: Diagnosis present

## 2023-01-26 DIAGNOSIS — E871 Hypo-osmolality and hyponatremia: Secondary | ICD-10-CM | POA: Diagnosis present

## 2023-01-26 DIAGNOSIS — Z66 Do not resuscitate: Secondary | ICD-10-CM | POA: Diagnosis not present

## 2023-01-26 LAB — HEMOGLOBIN AND HEMATOCRIT, BLOOD
HCT: 23.3 % — ABNORMAL LOW (ref 36.0–46.0)
HCT: 25.3 % — ABNORMAL LOW (ref 36.0–46.0)
Hemoglobin: 6.7 g/dL — CL (ref 12.0–15.0)
Hemoglobin: 7.2 g/dL — ABNORMAL LOW (ref 12.0–15.0)

## 2023-01-26 LAB — COMPREHENSIVE METABOLIC PANEL
ALT: 11 U/L (ref 0–44)
AST: 15 U/L (ref 15–41)
Albumin: 2.6 g/dL — ABNORMAL LOW (ref 3.5–5.0)
Alkaline Phosphatase: 30 U/L — ABNORMAL LOW (ref 38–126)
Anion gap: 12 (ref 5–15)
BUN: 18 mg/dL (ref 8–23)
CO2: 35 mmol/L — ABNORMAL HIGH (ref 22–32)
Calcium: 8.4 mg/dL — ABNORMAL LOW (ref 8.9–10.3)
Chloride: 85 mmol/L — ABNORMAL LOW (ref 98–111)
Creatinine, Ser: 1 mg/dL (ref 0.44–1.00)
GFR, Estimated: >60 mL/min (ref >60)
Glucose, Bld: 93 mg/dL (ref 70–99)
Potassium: 3.7 mmol/L (ref 3.5–5.1)
Sodium: 132 mmol/L — ABNORMAL LOW (ref 135–145)
Total Bilirubin: 1.2 mg/dL (ref 0.3–1.2)
Total Protein: 6.1 g/dL — ABNORMAL LOW (ref 6.5–8.1)

## 2023-01-26 LAB — PROCALCITONIN: Procalcitonin: <0.1 ng/mL

## 2023-01-26 LAB — BLOOD GAS, ARTERIAL
Acid-Base Excess: 18.8 mmol/L — ABNORMAL HIGH (ref 0.0–2.0)
Bicarbonate: 46.9 mmol/L — ABNORMAL HIGH (ref 20.0–28.0)
Drawn by: 53527
O2 Saturation: 93.9 %
Patient temperature: 36.4
pCO2 arterial: 67 mmHg (ref 32–48)
pH, Arterial: 7.45 (ref 7.35–7.45)
pO2, Arterial: 62 mmHg — ABNORMAL LOW (ref 83–108)

## 2023-01-26 LAB — TYPE AND SCREEN: Unit division: 0

## 2023-01-26 LAB — BPAM RBC: Unit Type and Rh: 6200

## 2023-01-26 LAB — CBC
HCT: 21.9 % — ABNORMAL LOW (ref 36.0–46.0)
Hemoglobin: 5.9 g/dL — CL (ref 12.0–15.0)
MCH: 23.3 pg — ABNORMAL LOW (ref 26.0–34.0)
MCHC: 26.9 g/dL — ABNORMAL LOW (ref 30.0–36.0)
MCV: 86.6 fL (ref 80.0–100.0)
Platelets: 120 10*3/uL — ABNORMAL LOW (ref 150–400)
RBC: 2.53 MIL/uL — ABNORMAL LOW (ref 3.87–5.11)
RDW: 19.1 % — ABNORMAL HIGH (ref 11.5–15.5)
WBC: 5 10*3/uL (ref 4.0–10.5)
nRBC: 0 % (ref 0.0–0.2)

## 2023-01-26 LAB — PREPARE RBC (CROSSMATCH)

## 2023-01-26 LAB — MAGNESIUM: Magnesium: 1.7 mg/dL (ref 1.7–2.4)

## 2023-01-26 MED ORDER — REVEFENACIN 175 MCG/3ML IN SOLN
175.0000 ug | Freq: Every day | RESPIRATORY_TRACT | Status: DC
  Administered 2023-01-26 – 2023-01-27 (×2): 175 ug via RESPIRATORY_TRACT
  Filled 2023-01-26 (×2): qty 3

## 2023-01-26 MED ORDER — SODIUM CHLORIDE 0.9 % IV SOLN
2.0000 g | INTRAVENOUS | Status: DC
  Administered 2023-01-26 – 2023-01-27 (×2): 2 g via INTRAVENOUS
  Filled 2023-01-26 (×2): qty 20

## 2023-01-26 MED ORDER — REVEFENACIN 175 MCG/3ML IN SOLN: 175.0000 ug | Freq: Every day | RESPIRATORY_TRACT | Status: DC

## 2023-01-26 MED ORDER — ARFORMOTEROL TARTRATE 15 MCG/2ML IN NEBU
15.0000 ug | INHALATION_SOLUTION | Freq: Two times a day (BID) | RESPIRATORY_TRACT | Status: DC
  Administered 2023-01-26 – 2023-01-27 (×3): 15 ug via RESPIRATORY_TRACT
  Filled 2023-01-26 (×3): qty 2

## 2023-01-26 MED ORDER — ALBUMIN HUMAN 25 % IV SOLN
25.0000 g | Freq: Four times a day (QID) | INTRAVENOUS | Status: AC
  Administered 2023-01-26 – 2023-01-27 (×4): 25 g via INTRAVENOUS
  Filled 2023-01-26 (×3): qty 100

## 2023-01-26 MED ORDER — MAGNESIUM SULFATE 2 GM/50ML IV SOLN
2.0000 g | Freq: Once | INTRAVENOUS | Status: AC
  Administered 2023-01-26: 2 g via INTRAVENOUS

## 2023-01-26 MED ORDER — BUDESONIDE 0.25 MG/2ML IN SUSP
0.2500 mg | Freq: Two times a day (BID) | RESPIRATORY_TRACT | Status: DC
  Administered 2023-01-26 – 2023-01-27 (×3): 0.25 mg via RESPIRATORY_TRACT
  Filled 2023-01-26 (×3): qty 2

## 2023-01-26 MED ORDER — POTASSIUM CHLORIDE CRYS ER 20 MEQ PO TBCR
40.0000 meq | EXTENDED_RELEASE_TABLET | Freq: Two times a day (BID) | ORAL | Status: AC
  Administered 2023-01-26 – 2023-01-27 (×2): 40 meq via ORAL
  Filled 2023-01-26 (×2): qty 2

## 2023-01-26 MED ORDER — VANCOMYCIN HCL 2000 MG/400ML IV SOLN
2000.0000 mg | Freq: Once | INTRAVENOUS | Status: AC
  Administered 2023-01-26: 2000 mg via INTRAVENOUS
  Filled 2023-01-26: qty 400

## 2023-01-26 MED ORDER — SODIUM CHLORIDE 0.9% IV SOLUTION: Freq: Once | INTRAVENOUS | Status: AC

## 2023-01-26 MED ORDER — MAGNESIUM SULFATE 2 GM/50ML IV SOLN
2.0000 g | Freq: Once | INTRAVENOUS | Status: DC
  Filled 2023-01-26: qty 50

## 2023-01-26 MED ORDER — VANCOMYCIN HCL 1500 MG/300ML IV SOLN
1500.0000 mg | Freq: Two times a day (BID) | INTRAVENOUS | Status: DC
  Administered 2023-01-27: 1500 mg via INTRAVENOUS
  Filled 2023-01-26 (×2): qty 300

## 2023-01-26 MED ORDER — METOPROLOL SUCCINATE ER 25 MG PO TB24
12.5000 mg | ORAL_TABLET | Freq: Every day | ORAL | Status: DC
  Administered 2023-01-26: 12.5 mg via ORAL
  Filled 2023-01-26: qty 1

## 2023-01-26 NOTE — Plan of Care (Signed)
  Problem: Elimination: Goal: Will not experience complications related to urinary retention Outcome: Progressing   Problem: Safety: Goal: Ability to remain free from injury will improve Outcome: Progressing   

## 2023-01-26 NOTE — Consult Note (Addendum)
Cardiology Consultation   Patient ID: [name] MRN: [id number]; DOB: Oct 27, 1955  Admit date: 01/25/2023 Date of Consult: 01/26/2023  PCP:  Annita Brod, MD   [facility] Providers Cardiologist:  Chilton Si, MD     Patient Profile:   [name] is a 67 y.o. 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 Hanley Ben.  History of Present Illness:   [name] is a 67 year old female with past medical history noted above.  She has been followed by Dr. Duke Salvia as an outpatient.  Admitted 03/2021 with respiratory failure and found to have new onset atrial fibrillation.  At that time she was noted to be essentially bedbound but a TEE/DCCV was performed prior to discharge.  Echo during that admission showed LVEF of 50 to 55%, no regional wall motion abnormality, normal RV function, moderate enlargement, mild left atrial enlargement, moderate right atrial enlargement, mild MR. Of note she was discharged on home oxygen.  She was placed on amiodarone, Eliquis and metoprolol.  She was thought to have OSA but unable to have a sleep study completed.   She was seen through a telemedicine visit on 09/2022 and reported some issues with her blood pressures and hypotensive episodes. Her metoprolol was stopped in the setting of hypotension.   Presented to the ED on 5/20 with complaints of worsening cough and shortness of breath. Was recently admitted on 12/05/2022-12/06/2022 with symptomatic anemia requiring transfusion.  It was felt that her anemia was related to iron deficiency versus chronic disease.  She was resumed on Eliquis at discharge.  Reported she was treated for possible pneumonia the week prior with  ciprofloxacin and finished that course without significant improvement.  In the ED her labs showed sodium 133, potassium 4.2, creatinine 0.97, BNP 332, high-sensitivity troponin 5>>6, WBC 5.5, hemoglobin 6.2.  EKG showed atrial fibrillation, 86 bpm.  Chest x-ray with no active disease.  She was treated with IV Lasix and given 1 unit of PRBCs.  Admitted to internal medicine for further management.  Cardiology asked to evaluate in regards to CHF.  Increasing O2 requirements the morning of 5/21.  Repeat chest x-ray showed concern for consolidation possible pneumonia versus aspiration.   Past Medical History:  Diagnosis Date   Anemia    Arthritis    AVN (avascular necrosis of bone) (HCC) 04/14/2013   Chronic kidney disease    Complication of anesthesia    Decreased mobility    hx. Rt. hip "avascular necrosis" -unable to weight bear long periods, "using wheelchair"..   Depression    GERD (gastroesophageal reflux disease)    Hep B w/o coma    Hep C w/o coma, chronic (HCC)    07-20-14 being presently tx. "[facility]"-good response per pt. will complete in 7days- Dr. Luciana Axe follows [facility].   Hypertension    Hypothyroid    mild- no medication   Obesity    Osteoporosis    PAF (paroxysmal atrial fibrillation) (HCC) 03/22/2021   Pneumonia    PONV (postoperative nausea and vomiting)    only with Demerol   Shortness of breath dyspnea    still smokes   Superficial thrombophlebitis    left leg   Transfusion history    70's,  80's ? hepatitis C attributed to past transfusions    Past Surgical History:  Procedure Laterality Date   ABDOMINAL HYSTERECTOMY     APPENDECTOMY     thinks it was removed with hysterectomy   arthroscopies     knee-right x 7   CARDIOVERSION N/A 03/26/2021   Procedure: CARDIOVERSION;  Surgeon: Jake Bathe, MD;  Location: [facility] ENDOSCOPY;  Service: Cardiovascular;  Laterality: N/A;   CHOLECYSTECTOMY     COLON SURGERY     COLONOSCOPY      COLONOSCOPY WITH PROPOFOL N/A 02/26/2015   Procedure: COLONOSCOPY WITH PROPOFOL;  Surgeon: Louis Meckel, MD;  Location: [facility] ENDOS[facility];  Service: Endoscopy;  Laterality: N/A;   COLONOSCOPY WITH PROPOFOL N/A 03/03/2016   Procedure: COLONOSCOPY WITH PROPOFOL;  Surgeon: Napoleon Form, MD;  Location: [facility];  Service: Endoscopy;  Laterality: N/A;   ESOPHAGOGASTRODUODENOSCOPY (EGD) WITH PROPOFOL N/A 03/03/2016   Procedure: ESOPHAGOGASTRODUODENOSCOPY (EGD) WITH PROPOFOL;  Surgeon: Napoleon Form, MD;  Location: [facility];  Service: Endoscopy;  Laterality: N/A;   JOINT REPLACEMENT     right knee   TEE WITHOUT CARDIOVERSION N/A 03/26/2021   Procedure: TRANSESOPHAGEAL ECHOCARDIOGRAM (TEE);  Surgeon: Jake Bathe, MD;  Location: [facility] ENDOSCOPY;  Service: Cardiovascular;  Laterality: N/A;   TOTAL KNEE ARTHROPLASTY       Home Medications:  Prior to Admission medications   Medication Sig Start Date End Date Taking? Authorizing Provider  acetaminophen (TYLENOL) 160 MG/5ML solution Take 20 mLs by mouth as needed for mild pain or moderate pain.   Yes [provider]  albuterol (PROVENTIL) (2.5 MG/3ML) 0.083% nebulizer solution Take by nebulization. 12/05/22  Yes [provider]  albuterol (VENTOLIN HFA) 108 (90 Base) MCG/ACT inhaler Inhale into the lungs. 12/05/22  Yes [provider]  amiodarone (PACERONE) 200 MG tablet Take one tablet twice daily until 04/08/21, then change to one tablet daily starting on 04/09/21. Patient taking differently: Take 200 mg by mouth daily. 03/27/21  Yes Arrien, York Ram, MD  apixaban (ELIQUIS) 5 MG TABS tablet Take 5 mg by mouth 2 (two) times daily.   Yes [provider]  atorvastatin (LIPITOR) 40 MG tablet Take 40 mg by mouth daily.   Yes [provider]  escitalopram (LEXAPRO) 10 MG tablet TAKE 1 TABLET BY MOUTH EVERY DAY. Need TO keep appointment in June FOR future refills Patient taking differently: Take 10 mg  by mouth daily. 12/13/19  Yes Grayce Sessions, NP  ferrous sulfate 325 (65 FE) MG EC tablet Take 1 tablet (325 mg total) by mouth 2 (two) times daily. 12/06/22 01/25/23 Yes Azucena Fallen, MD  furosemide (LASIX) 40 MG tablet Take 40 mg by mouth daily.   Yes [provider]  levothyroxine (SYNTHROID) 88 MCG tablet Take 88 mcg by mouth daily. 06/08/22  Yes [provider]  metFORMIN (GLUCOPHAGE-XR) 500 MG 24 hr tablet Take 1,000 mg by mouth daily with breakfast. 09/11/22  Yes [provider]  metoprolol succinate (TOPROL-XL) 25 MG 24 hr tablet TAKE 1/2 TABLET BY MOUTH EVERY DAY 12/01/22  Yes Chilton Si, MD  omeprazole (PRILOSEC) 40 MG capsule Take 1 capsule (40 mg total) by mouth daily as needed (acid reflux). Patient taking differently: Take 40 mg by mouth daily. 10/16/19  Yes Grayce Sessions, NP  potassium chloride SA (KLOR-CON M) 20 MEQ tablet Take 20 mEq by mouth every evening.   Yes [provider]  sodium chloride (OCEAN) 0.65 % SOLN nasal spray Place 1  spray into both nostrils as needed for congestion.   Yes [provider]  acetaminophen (TYLENOL) 325 MG tablet Take 2 tablets (650 mg total) by mouth every 6 (six) hours as needed for mild pain (or Fever >/= 101). Patient not taking: Reported on 01/25/2023 03/27/21   Arrien, York Ram, MD  benzonatate (TESSALON) 200 MG capsule Take 1 capsule by mouth 3 (three) times daily. Patient not taking: Reported on 01/25/2023    [provider]  ciprofloxacin (CIPRO) 500 MG tablet Take 500 mg by mouth 2 (two) times daily. Patient not taking: Reported on 01/25/2023    [provider]    Inpatient Medications: Scheduled Meds:  amiodarone  200 mg Oral Daily   arformoterol  15 mcg Nebulization BID   atorvastatin  40 mg Oral Daily   budesonide (PULMICORT) nebulizer solution  0.25 mg Nebulization BID   escitalopram  10 mg Oral Daily   furosemide  40 mg Intravenous BID    ipratropium-albuterol  3 mL Nebulization Q6H   levothyroxine  88 mcg Oral Q0600   metoprolol succinate  12.5 mg Oral Daily   potassium chloride  40 mEq Oral BID   revefenacin  175 mcg Nebulization Daily   Continuous Infusions:  albumin human 25 g (01/26/23 1259)   cefTRIAXone (ROCEPHIN)  IV 2 g (01/26/23 1320)   PRN Meds: acetaminophen **OR** acetaminophen, albuterol, LORazepam, ondansetron **OR** ondansetron (ZOFRAN) IV, senna-docusate  Allergies:    Allergies  Allergen Reactions   Penicillins Anaphylaxis and Other (See Comments)    Has patient had a PCN reaction causing immediate rash, facial/tongue/throat swelling, SOB or lightheadedness with hypotension: Yes Has patient had a PCN reaction causing severe rash involving mucus membranes or skin necrosis: No Has patient had a PCN reaction that required hospitalization: Yes Has patient had a PCN reaction occurring within the last 10 years: No If all of the above answers are "NO", then may proceed with Cephalosporin use.    Demerol Nausea And Vomiting   Meperidine Nausea And Vomiting   Morphine And Codeine Nausea And Vomiting    Social History:   Social History   Socioeconomic History   Marital status: Divorced    Spouse name: Not on file   Number of children: Not on file   Years of education: Not on file   Highest education level: Not on file  Occupational History   Not on file  Tobacco Use   Smoking status: Every Day    Packs/day: 0.50    Years: 20.00    Additional pack years: 0.00    Total pack years: 10.00    Types: Cigarettes   Smokeless tobacco: Never   Tobacco comments:    trying to cut back  Vaping Use   Vaping Use: Not on file  Substance and Sexual Activity   Alcohol use: No    Alcohol/week: 0.0 standard drinks of alcohol   Drug use: No   Sexual activity: Not Currently  Other Topics Concern   Not on file  Social History Narrative   Not on file   Social Determinants of Health   Financial Resource  Strain: Medium Risk (08/23/2019)   Overall Financial Resource Strain (CARDIA)    Difficulty of Paying Living Expenses: Somewhat hard  Food Insecurity: No Food Insecurity (12/06/2022)   Hunger Vital Sign    Worried About Running Out of Food in the Last Year: Never true    Ran Out of Food in the Last Year: Never true  Transportation Needs:  Unmet Transportation Needs (12/06/2022)   PRAPARE - Administrator, Civil Service (Medical): Yes    Lack of Transportation (Non-Medical): Yes  Physical Activity: Inactive (08/23/2019)   Exercise Vital Sign    Days of Exercise per Week: 0 days    Minutes of Exercise per Session: 0 min  Stress: Stress Concern Present (03/17/2019)   [employer] of Occupational Health - Occupational Stress Questionnaire    Feeling of Stress : Very much  Social Connections: Socially Isolated (03/17/2019)   Social Connection and Isolation Panel [NHANES]    Frequency of Communication with Friends and Family: Once a week    Frequency of Social Gatherings with Friends and Family: Never    Attends Religious Services: Never    Database administrator or Organizations: No    Attends Banker Meetings: Never    Marital Status: Living with partner  Intimate Partner Violence: Not At Risk (12/06/2022)   Humiliation, Afraid, Rape, and Kick questionnaire    Fear of Current or Ex-Partner: No    Emotionally Abused: No    Physically Abused: No    Sexually Abused: No    Family History:    Family History  Problem Relation Age of Onset   Cancer Mother    Diabetes Mother    Heart disease Mother    Hypertension Mother    Cancer Father    Hypertension Sister    Hypertension Brother    Diabetes Brother    Colon cancer Neg Hx      ROS:  Please see the history of present illness.   All other ROS reviewed and negative.     Physical Exam/Data:   Vitals:   01/26/23 1154 01/26/23 1305 01/26/23 1400 01/26/23 1420  BP:  118/72 101/74   Pulse: 94 82 90    Resp: 17 19 14    Temp:      TempSrc:      SpO2: 92% 95% 92% 94%  Weight:      Height:        Intake/Output Summary (Last 24 hours) at 01/26/2023 1430 Last data filed at 01/26/2023 1000 Gross per 24 hour  Intake 902 ml  Output 1300 ml  Net -398 ml      01/26/2023    4:48 AM 01/25/2023    2:45 PM 12/06/2022    1:00 AM  Last 3 Weights  Weight (lbs) 368 lb 2.7 oz 364 lb 364 lb 11.2 oz  Weight (kg) 167 kg 165.109 kg 165.427 kg     Body mass index is 56.81 kg/m.  General:  Ill appearing, older obese female HEENT: normal Neck: no JVD Vascular: No carotid bruits; Distal pulses 2+ bilaterally Cardiac:  normal S1, S2; Irreg Irreg; no murmur  Lungs:  clear to auscultation bilaterally, no wheezing, rhonchi or rales  Abd: soft, nontender, no hepatomegaly  Ext: 3+ bilateral LE edema Musculoskeletal:  No deformities, BUE and BLE strength normal and equal Skin: warm and dry  Neuro:  CNs 2-12 intact, no focal abnormalities noted Psych:  Normal affect   EKG:  The EKG was personally reviewed and demonstrates: Atrial Fibrillation, 86 bpm Telemetry:  Telemetry was personally reviewed and demonstrates:  atrial fibrillation, rates 80s  Relevant CV Studies:  Echo: 11/2022  IMPRESSIONS     1. Very difficult study due to poor echo windows.   2. Left ventricular ejection fraction, by estimation, is 55 to 60%. The  left ventricle has normal function. Left ventricular endocardial border  not optimally defined to evaluate regional wall motion. Left ventricular  diastolic parameters are  indeterminate.   3. Right ventricular systolic function is mildly reduced. The right  ventricular size is mildly to moderately enlarged.   4. Right atrial size was mildly dilated.   5. The mitral valve was not well visualized. Trivial mitral valve  regurgitation.   6. The aortic valve is tricuspid. Aortic valve regurgitation is not  visualized. Aortic valve sclerosis is present, with no evidence of aortic   valve stenosis.   7. The inferior vena cava is dilated in size with <50% respiratory  variability, suggesting right atrial pressure of 15 mmHg.   Comparison(s): No significant change from prior study.   FINDINGS   Left Ventricle: Left ventricular ejection fraction, by estimation, is 55  to 60%. The left ventricle has normal function. Left ventricular  endocardial border not optimally defined to evaluate regional wall motion.  Definity contrast agent was given IV to  delineate the left ventricular endocardial borders. The left ventricular  internal cavity size was normal in size. There is no left ventricular  hypertrophy. Left ventricular diastolic parameters are indeterminate.   Right Ventricle: The right ventricular size is mildly to moderately  enlarged. No increase in right ventricular wall thickness. Right  ventricular systolic function is mildly reduced.   Left Atrium: Left atrial size was not well visualized.   Right Atrium: Right atrial size was mildly dilated.   Pericardium: There is no evidence of pericardial effusion.   Mitral Valve: The mitral valve was not well visualized. Mild to moderate  mitral annular calcification. Trivial mitral valve regurgitation.   Tricuspid Valve: The tricuspid valve is normal in structure. Tricuspid  valve regurgitation is trivial.   Aortic Valve: The aortic valve is tricuspid. Aortic valve regurgitation is  not visualized. Aortic valve sclerosis is present, with no evidence of  aortic valve stenosis. Aortic valve mean gradient measures 3.0 mmHg.  Aortic valve peak gradient measures 6.0   mmHg. Aortic valve area, by VTI measures 1.44 cm.   Pulmonic Valve: The pulmonic valve was not well visualized. Pulmonic valve  regurgitation is not visualized.   Aorta: The aortic root is normal in size and structure.   Venous: The inferior vena cava is dilated in size with less than 50%  respiratory variability, suggesting right atrial pressure  of 15 mmHg.   IAS/Shunts: The atrial septum is grossly normal.    Laboratory Data:  High Sensitivity Troponin:   Recent Labs  Lab 01/25/23 1530 01/25/23 1947  TROPONINIHS 5 6     Chemistry Recent Labs  Lab 01/25/23 1530 01/26/23 0114  NA 133* 132*  K 4.2 3.7  CL 85* 85*  CO2 33* 35*  GLUCOSE 92 93  BUN 21 18  CREATININE 0.97 1.00  CALCIUM 8.5* 8.4*  MG 1.7 1.7  GFRNONAA >60 >60  ANIONGAP 15 12    Recent Labs  Lab 01/25/23 1530 01/26/23 0114  PROT 6.8 6.1*  ALBUMIN 2.8* 2.6*  AST 20 15  ALT 14 11  ALKPHOS 34* 30*  BILITOT 0.5 1.2   Lipids No results for input(s): "CHOL", "TRIG", "HDL", "LABVLDL", "LDLCALC", "CHOLHDL" in the last 168 hours.  Hematology Recent Labs  Lab 01/25/23 1530 01/26/23 0114 01/26/23 0808  WBC 5.5 5.0  --   RBC 2.69* 2.53*  --   HGB 6.2* 5.9* 6.7*  HCT 23.2* 21.9* 23.3*  MCV 86.2 86.6  --   MCH 23.0* 23.3*  --   MCHC  26.7* 26.9*  --   RDW 18.6* 19.1*  --   PLT 119* 120*  --    Thyroid No results for input(s): "TSH", "FREET4" in the last 168 hours.  BNP Recent Labs  Lab 01/25/23 1530  BNP 332.5*    DDimer No results for input(s): "DDIMER" in the last 168 hours.   Radiology/Studies:  DG CHEST PORT 1 VIEW  Result Date: 01/26/2023 CLINICAL DATA:  Acute on chronic respiratory failure. EXAM: PORTABLE CHEST 1 VIEW COMPARISON:  Chest x-ray from yesterday. FINDINGS: Unchanged cardiomegaly. New bibasilar consolidation. No pneumothorax or large pleural effusion. No acute osseous abnormality. IMPRESSION: 1. New bibasilar consolidation, concerning for pneumonia or aspiration. Electronically Signed   By: Obie Dredge M.D.   On: 01/26/2023 11:18   DG CHEST PORT 1 VIEW  Result Date: 01/25/2023 CLINICAL DATA:  Shortness of breath. EXAM: PORTABLE CHEST 1 VIEW COMPARISON:  12/05/2022 FINDINGS: Heart is enlarged, stable in configuration. Mildly prominent interstitial markings are similar to prior study. No new consolidations or pleural  effusions. Prominent pulmonary artery segments again noted. IMPRESSION: 1. No active disease. 2. Stable cardiomegaly. 3. Prominent pulmonary artery segments. Electronically Signed   By: Norva Pavlov M.D.   On: 01/25/2023 16:50     Assessment and Plan:   SHARIE NATALI is a 67 y.o. 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 Hanley Ben.  Acute on chronic hypoxic respiratory failure -- presented with worsening shortness of breath, on Optima at 3.5L baseline. Developed worsening respiratory failure this morning, CXR with concern for PNA vs aspiration  -- antibiotics per primary -- PCCM consulted, discussion with the patient and family regarding code status. Now DNR/DNI -- recent echo 11/2022 with normal LVEF, mildly reduced RV -- currently on optiflow, but does not seem to be tolerating at present  -- started on IV lasix 40mg  BID, 1.3L UOP today  Acute on chronic anemia -- recent admission for the same, Hgb at DC 7.4 -- presented back with Hgb 5.9 s/p 1 unit PRBCs -- this was felt to be 2/2 IDA during recent admission, Eliquis currently held  Paroxysmal atrial fibrillation -- currently rate controlled, given acute illness/soft blood pressures will hold metoprolol for now  Hypertension -- soft but stable  Risk Assessment/Risk Scores:   New York Heart Association (NYHA) Functional Class NYHA Class II  CHA2DS2-VASc Score = 3  This indicates a 3.2% annual risk of stroke. The patient's score is based upon: CHF History: 0 HTN History: 1 Diabetes History: 0 Stroke History: 0 Vascular Disease History: 0 Age Score: 1 Gender Score: 1    For questions or updates, please contact [facility] Please consult  [email or web address] for contact info under    Signed, Laverda Page, NP  01/26/2023 2:30 PM  Patient seen and examined with Geoffry Paradise, NP.  Agree as above, with the following exceptions and changes as noted below.  Patient is a 67 year old female with multiple medical issues on whom we are consulted for heart failure.  Patient is currently on high flow nasal cannula with continued tachypnea.  Notable blood gas with CO2 retention, respiratory failure is the primary driver it seems of her current condition.  She also has a profoundly low hemoglobin of 5.9 now status post  1 unit PRBC, previously on anticoagulation now held.  Gen: Tachypneic, CV: Irregular, no murmurs, Lungs: clear, Abd: soft, Extrem: Warm, well perfused, 3+ edema, Neuro/Psych: alert and oriented x 3, normal mood and affect. All available labs, radiology testing, previous records reviewed.  Patient's primary respiratory issue seems lung related, reasonable to attempt diuresis today.  It seems she has been declining today including decreasing mentation.  She is clear while I am in the room with her and asked appropriate questions, but I suspect this is waxing and waning.  Goals of care actively being had, no aggressive cardiac interventions would likely benefit her current situation.  Cardiology can follow peripherally as goals of care are further define.  I spoke to the family at the bedside today who understand.   Parke Poisson, MD 01/26/23 5:28 PM

## 2023-01-26 NOTE — Evaluation (Signed)
Physical Therapy Evaluation Patient Details Name: [name] MRN: [id number] DOB: 26-Nov-1955 Today's Date: 01/26/2023  History of Present Illness  Pt is a 67 y/o F presenting from home with cough and SOB for last 2 weeks, found with fluid overload. PMH includes PAF on Eliquis, HFpEF, OHS, chronic hypoxic respiratory failure on 5 L [place], T2DM, HTN, hypothyroidism, hepatic cirrhosis, squamous cell cancer of the face s/p radiation, avascular necrosis of right hip with chronic wheelchair-bound status, last admission from 12/05/2022-12/06/2022 for symptomatic anemia requiring transfusion.  Clinical Impression  Received pt semi-reclined in bed on 7L O2 - SPO2 remained >92% throughout session. Pt reports being bound to her lift chair at home for past 5 years and only transferring out of lift chair to get to [facility] or get onto bedside commode, but recently has been unable to even do that due to improper fitting/broken equipment. Pt's husband is unable to provide any physical assist upon discharge. Pt required max A for bed mobility with HOB elevated and use of bed features and +2 assist to scoot to [facility]. Pt reported urge to have BM, however deferred transfer to bedside commode and recommended bedpan due to weakness/deconditioning. Pt demonstrates decreased insight into deficits and impaired safety awareness and would benefit from continued PT services. Acute PT to cont to follow.      Recommendations for follow up therapy are one component of a multi-disciplinary discharge planning process, led by the attending physician.  Recommendations may be updated based on patient status, additional functional criteria and insurance authorization.  Follow Up Recommendations Can patient physically be transported by private vehicle: No     Assistance Recommended at Discharge Frequent or constant Supervision/Assistance  Patient can return home with the following  Two people to help with walking and/or transfers;Two people to  help with bathing/dressing/bathroom;Assistance with cooking/housework;Assist for transportation;Help with stairs or ramp for entrance    Equipment Recommendations Other (comment) (TBD)  Recommendations for Other Services       Functional Status Assessment Patient has had a recent decline in their functional status and demonstrates the ability to make significant improvements in function in a reasonable and predictable amount of time.     Precautions / Restrictions Precautions Precautions: Fall Precaution Comments: monitor O2 Restrictions Weight Bearing Restrictions: No      Mobility  Bed Mobility Overal bed mobility: Needs Assistance Bed Mobility: Rolling, Sit to Supine, Supine to Sit Rolling: +2 for physical assistance   Supine to sit: Max assist, HOB elevated Sit to supine: Max assist   General bed mobility comments: required assist for BLE management and trunk control. Required +2 assist to scoot to [facility] and to roll to place chuck pad Patient Response: Cooperative  Transfers                   General transfer comment: deferred due to safety concerns and weakness    Ambulation/Gait               General Gait Details: deferred due to safety concerns and weakness  Stairs            Wheelchair Mobility    Modified Rankin (Stroke Patients Only)       Balance Overall balance assessment: Needs assistance Sitting-balance support: Bilateral upper extremity supported, Feet supported Sitting balance-Leahy Scale: Good Sitting balance - Comments: able to maintain static sitting balance with supervision sitting EOB       Standing balance comment: deferred due to safety concerns  Pertinent Vitals/Pain Pain Assessment Pain Assessment: Faces Faces Pain Scale: Hurts little more Pain Location: R shoulder and low back Pain Descriptors / Indicators: Discomfort, Grimacing, Sore Pain Intervention(s): Limited  activity within patient's tolerance, Monitored during session, Repositioned    Home Living Family/patient expects to be discharged to:: Private residence Living Arrangements: Spouse/significant other Available Help at Discharge: Family;Available 24 hours/day (cannot provide any physical assist) Type of Home: House Home Access: Ramped entrance       Home Layout: One level Home Equipment: Agricultural consultant (2 wheels);Wheelchair - manual;BSC/3in1 Additional Comments: Pt reports being essentially chair bound - spends majority of her time in lift chair (including sleeping) and occasionally would transfer into WC. Has WC but hasn't been using it recently because it's too small. Also needs new bedside commode    Prior Function Prior Level of Function : Needs assist       Physical Assist : ADLs (physical)   ADLs (physical): Bathing;Dressing Mobility Comments: did not need any physical assist with transfers       Hand Dominance        Extremity/Trunk Assessment   Upper Extremity Assessment Upper Extremity Assessment: Defer to OT evaluation    Lower Extremity Assessment Lower Extremity Assessment: Generalized weakness    Cervical / Trunk Assessment Cervical / Trunk Assessment: Normal  Communication   Communication: No difficulties  Cognition Arousal/Alertness: Awake/alert Behavior During Therapy: WFL for tasks assessed/performed Overall Cognitive Status: Within Functional Limits for tasks assessed                                 General Comments: decreased insight into deficits        General Comments General comments (skin integrity, edema, etc.): upon sitting EOB, pt breathing heavily and SOB (vitals WFL) and pt cotinued to state "I'm just so weak". Pt initally wanting to transfer to bedside commode, however deferred due to safety concerns due to extreme weakness/deconditioning.    Exercises     Assessment/Plan    PT Assessment Patient needs continued  PT services  PT Problem List Decreased strength;Decreased range of motion;Decreased activity tolerance;Decreased balance;Decreased mobility;Decreased coordination;Impaired sensation;Cardiopulmonary status limiting activity;Decreased safety awareness;Obesity;Pain       PT Treatment Interventions DME instruction;Gait training;Functional mobility training;Therapeutic activities;Therapeutic exercise;Wheelchair mobility training;Patient/family education;Balance training    PT Goals (Current goals can be found in the Care Plan section)  Acute Rehab PT Goals Patient Stated Goal: to return home PT Goal Formulation: With patient Time For Goal Achievement: 02/09/23 Potential to Achieve Goals: Fair    Frequency Min 2X/week     Co-evaluation               AM-PAC PT "6 Clicks" Mobility  Outcome Measure Help needed turning from your back to your side while in a flat bed without using bedrails?: Total Help needed moving from lying on your back to sitting on the side of a flat bed without using bedrails?: Total Help needed moving to and from a bed to a chair (including a wheelchair)?: Total Help needed standing up from a chair using your arms (e.g., wheelchair or bedside chair)?: Total Help needed to walk in hospital room?: Total Help needed climbing 3-5 steps with a railing? : Total 6 Click Score: 6    End of Session Equipment Utilized During Treatment: Oxygen Activity Tolerance: Patient limited by fatigue Patient left: in bed;with call bell/phone within reach;with bed alarm set Nurse Communication: Mobility  status PT Visit Diagnosis: Unsteadiness on feet (R26.81);Other abnormalities of gait and mobility (R26.89);Muscle weakness (generalized) (M62.81);Pain Pain - Right/Left: Right Pain - part of body: Shoulder    Time: 6578-4696 PT Time Calculation (min) (ACUTE ONLY): 29 min   Charges:   PT Evaluation $PT Eval Moderate Complexity: 1 Mod PT Treatments $Therapeutic Activity: 8-22  mins        Blima Rich PT, DPT Marlana Salvage Zaunegger 01/26/2023, 9:25 AM

## 2023-01-26 NOTE — Progress Notes (Signed)
Pharmacy Antibiotic Note  [name] is a 67 y.o. female admitted on 01/25/2023 with pneumonia.  Pharmacy has been consulted for vanco dosing.  Plan: Vancomycin 2000 mg iv x1 f/b 1500 mg IV every 12 hours.  eAUC 463 mcg*hr/mL Scr utilized: 1 mg/dL Vd coefficient: 0.5 L/kg Adjusted IBW: 103.8 kg T (1/2): ~9 hours  Height: 5' 7.5" (171.5 cm) Weight: (!) 167 kg (368 lb 2.7 oz) IBW/kg (Calculated) : 62.75  Temp (24hrs), Avg:98 F (36.7 C), Min:97.6 F (36.4 C), Max:98.4 F (36.9 C)  Recent Labs  Lab 01/25/23 1530 01/26/23 0114  WBC 5.5 5.0  CREATININE 0.97 1.00    Estimated Creatinine Clearance: 88.8 mL/min (by C-G formula based on SCr of 1 mg/dL).    Allergies  Allergen Reactions   Penicillins Anaphylaxis and Other (See Comments)    Has patient had a PCN reaction causing immediate rash, facial/tongue/throat swelling, SOB or lightheadedness with hypotension: Yes Has patient had a PCN reaction causing severe rash involving mucus membranes or skin necrosis: No Has patient had a PCN reaction that required hospitalization: Yes Has patient had a PCN reaction occurring within the last 10 years: No If all of the above answers are "NO", then may proceed with Cephalosporin use.    Demerol Nausea And Vomiting   Meperidine Nausea And Vomiting   Morphine And Codeine Nausea And Vomiting    Antimicrobials this admission: vanco 5/21 >>  CTX 5/21 >>     Microbiology results: No micro's ordered or resulted at the time of this note  Thank you for allowing pharmacy to be a part of this patient's care.  Greta Doom BS, PharmD, BCPS Clinical Pharmacist 01/26/2023 2:52 PM  Contact: [phone] after 3 PM  "Be curious, not judgmental..." -Debbora Dus

## 2023-01-26 NOTE — Evaluation (Signed)
Clinical/Bedside Swallow Evaluation Patient Details  Name: [name] MRN: [id number] Date of Birth: 1955/12/28  Today's Date: 01/26/2023 Time: SLP Start Time (ACUTE ONLY): 1206 SLP Stop Time (ACUTE ONLY): 1219 SLP Time Calculation (min) (ACUTE ONLY): 13 min  Past Medical History:  Past Medical History:  Diagnosis Date   Anemia    Arthritis    AVN (avascular necrosis of bone) (HCC) 04/14/2013   Chronic kidney disease    Complication of anesthesia    Decreased mobility    hx. Rt. hip "avascular necrosis" -unable to weight bear long periods, "using wheelchair"..   Depression    GERD (gastroesophageal reflux disease)    Hep B w/o coma    Hep C w/o coma, chronic (HCC)    07-20-14 being presently tx. "Harvoni"-good response per pt. will complete in 7days- Dr. Luciana Axe follows [name] infectious disease control.   Hypertension    Hypothyroid    mild- no medication   Obesity    Osteoporosis    PAF (paroxysmal atrial fibrillation) (HCC) 03/22/2021   Pneumonia    PONV (postoperative nausea and vomiting)    only with Demerol   Shortness of breath dyspnea    still smokes   Superficial thrombophlebitis    left leg   Transfusion history    70's, 80's ? hepatitis C attributed to past transfusions   Past Surgical History:  Past Surgical History:  Procedure Laterality Date   ABDOMINAL HYSTERECTOMY     APPENDECTOMY     thinks it was removed with hysterectomy   arthroscopies     knee-right x 7   CARDIOVERSION N/A 03/26/2021   Procedure: CARDIOVERSION;  Surgeon: Jake Bathe, MD;  Location: [facility] ENDOSCOPY;  Service: Cardiovascular;  Laterality: N/A;   CHOLECYSTECTOMY     COLON SURGERY     COLONOSCOPY     COLONOSCOPY WITH PROPOFOL N/A 02/26/2015   Procedure: COLONOSCOPY WITH PROPOFOL;  Surgeon: Louis Meckel, MD;  Location: [facility] ENDOSCOPY;  Service: Endoscopy;  Laterality: N/A;   COLONOSCOPY WITH PROPOFOL N/A 03/03/2016   Procedure: COLONOSCOPY WITH PROPOFOL;  Surgeon: Napoleon Form, MD;  Location: [facility] ENDOSCOPY;  Service: Endoscopy;  Laterality: N/A;   ESOPHAGOGASTRODUODENOSCOPY (EGD) WITH PROPOFOL N/A 03/03/2016   Procedure: ESOPHAGOGASTRODUODENOSCOPY (EGD) WITH PROPOFOL;  Surgeon: Napoleon Form, MD;  Location: [facility] ENDOSCOPY;  Service: Endoscopy;  Laterality: N/A;   JOINT REPLACEMENT     right knee   TEE WITHOUT CARDIOVERSION N/A 03/26/2021   Procedure: TRANSESOPHAGEAL ECHOCARDIOGRAM (TEE);  Surgeon: Jake Bathe, MD;  Location: [facility] ENDOSCOPY;  Service: Cardiovascular;  Laterality: N/A;   TOTAL KNEE ARTHROPLASTY     HPI:  [name] is a 67 y.o. female presented to ED with worsening SOB, dx acute on chronic diastolic HF, acute on chronic hypoxemic respiratory failure with increasing 02 needs. PMHx:  PAF on Eliquis, HFpEF, OHS, chronic hypoxic respiratory failure on 5 L , T2DM, HTN, hypothyroidism, hepatic cirrhosis, squamous cell cancer of the face s/p radiation, avascular necrosis of right hip, last admission from 12/05/2022-12/06/2022 for symptomatic anemia requiring transfusion.    Assessment / Plan / Recommendation  Clinical Impression  Pt participated in a bedside swallowing evaluation. She reports no difficulty with swallowing, occasional coughing with eating (<2x/week).  She has a congestive, productive cough at baseline. Oral mechanism exam was normal. She does not have dentures available; states she eats without them. Lunch tray was present - demonstrated thorough mastication, palpable swallow, no s/s of aspiration with liquids or solids. No  notable difficulty sequencing timing of the swallow/respiratory cycle. No s/s of an esophageal dysphagia. She reports belching and GERD symptoms intermittently; not present today.  No overt s/s of an oropharyngeal dysphagia. Recommend continuing current diet (regular/thin liquids). Our service will sign off.  Please re-consult if further assessment (instrumental study) would be helpful. SLP Visit Diagnosis: Dysphagia,  unspecified (R13.10)    Aspiration Risk  No limitations    Diet Recommendation   Regular solids, thin liquids  Medication Administration: Whole meds with liquid    Other  Recommendations Oral Care Recommendations: Oral care BID    Recommendations for follow up therapy are one component of a multi-disciplinary discharge planning process, led by the attending physician.  Recommendations may be updated based on patient status, additional functional criteria and insurance authorization.  Follow up Recommendations No SLP follow up        Swallow Study   General HPI: [name] is a 67 y.o. female presented to ED with worsening SOB, dx acute on chronic diastolic HF, acute on chronic hypoxemic respiratory failure with increasing 02 needs. PMHx:  PAF on Eliquis, HFpEF, OHS, chronic hypoxic respiratory failure on 5 L [name], T2DM, HTN, hypothyroidism, hepatic cirrhosis, squamous cell cancer of the face s/p radiation, avascular necrosis of right hip, last admission from 12/05/2022-12/06/2022 for symptomatic anemia requiring transfusion. Type of Study: Bedside Swallow Evaluation Previous Swallow Assessment: 2022 - normal findings per bedside exam Diet Prior to this Study: Regular;Thin liquids (Level 0) Temperature Spikes Noted: No Respiratory Status: Other (comment) (13 L HFNC) History of Recent Intubation: No Behavior/Cognition: Alert;Cooperative Oral Cavity Assessment: Within Functional Limits Oral Care Completed by SLP: No Oral Cavity - Dentition: Edentulous Vision: Functional for self-feeding Self-Feeding Abilities: Able to feed self Patient Positioning: Upright in bed Baseline Vocal Quality: Hoarse;Wet Volitional Cough: Strong Volitional Swallow: Able to elicit    Oral/Motor/Sensory Function Overall Oral Motor/Sensory Function: Within functional limits   Ice Chips Ice chips: Within functional limits   Thin Liquid Thin Liquid: Within functional limits    Nectar Thick Nectar Thick  Liquid: Not tested   Honey Thick Honey Thick Liquid: Not tested   Puree Puree: Within functional limits   Solid     Solid: Within functional limits      [name] 01/26/2023,12:24 PM  [name] L. Wendy Frederic, MA CCC/SLP Clinical Specialist - Acute Care SLP Acute Rehabilitation Services Office number [phone]

## 2023-01-26 NOTE — Progress Notes (Addendum)
PROGRESS NOTE    DANETTE BILA  ZOX:[id number] DOB: 1956/03/20 DOA: 01/25/2023 PCP: Annita Brod, MD   Brief Narrative:  67 y.o. female with medical history significant of 67 y.o. female with medical history significant for PAF on Eliquis, HFpEF, OHS, chronic hypoxic respiratory failure on 5 L [place], T2DM, HTN, hypothyroidism, hepatic cirrhosis, squamous cell cancer of the face s/p radiation, avascular necrosis of right hip with chronic wheelchair-bound status, last admission from 12/05/2022-12/06/2022 for symptomatic anemia requiring transfusion presented with worsening cough and shortness of breath for the last 2 weeks.  On presentation, hemoglobin was 6.2 and she appeared fluid overloaded.  Chest x-ray showed cardiomegaly. BNP of 332.5. She was given IV Lasix.  She was transfused with 1 unit packed red cells.  Assessment & Plan:   Acute on chronic diastolic heart failure -Presented with worsening shortness of breath with increased leg swelling and appears to be in volume overload.  Chest x-ray as above.   -Continue Lasix 40 mg IV twice daily.  Strict input and output.  Daily weights.  Fluid restriction.  Echo on 12/06/2022 had shown EF of 55 to 60%.  Continue metoprolol succinate. -Still appears volume overloaded.  Consult cardiology.  Acute on chronic anemia/anemia of chronic disease -Presented with hemoglobin of 6.2.  Hemoglobin on 12/06/2022 was 7.4. -Possibly chronic anemia from chronic illnesses including heart failure/cirrhosis of liver. -Status post 1 unit packed red cells transfusion.  Repeat hemoglobin after transfusion is pending.  Eliquis on old.  Monitor H&H.   Acute on chronic respiratory failure with hypoxia -On 5 L oxygen via nasal cannula at home.  Respiratory status has worsened overnight and patient is requiring 7 L oxygen via nasal cannula.  Check ABG.  Consult pulmonary.  Hyponatremia -Mild.  Monitor.   Paroxysmal A-fib -Currently rate controlled.  Continue amiodarone  and Toprol-XL.  Eliquis on hold.   Essential hypertension Hyperlipidemia -Blood pressure currently stable.  Monitor.  Continue Toprol-XL. -Continue statin  Depression with anxiety -Continue Lexapro  Hypothyroidism -Continue Synthroid  Cirrhosis of liver -Stable.  Outpatient follow-up with GI   Thrombocytopenia, chronic -Possibly from cirrhosis of liver.  Monitor platelets.   Goals of care -Patient is full code.  Overall prognosis is guarded to poor.  Consult palliative care      DVT prophylaxis: SCDs Code Status: Full Family Communication: Sister and niece at bedside on 01/25/2023. Disposition Plan: Status is: Observation The patient will require care spanning > 2 midnights and should be moved to inpatient because: Of severity of illness.  Still volume overloaded and will need IV diuretics.   Consultants: Palliative care.  Consult cardiology.  Consult pulmonary.  Procedures: None  Antimicrobials: None   Subjective: Patient seen and examined at bedside.  Does not feel well.  Still short of breath with even minimal exertion.  No fever, vomiting, agitation reported.  Objective: Vitals:   01/26/23 0216 01/26/23 0233 01/26/23 0448 01/26/23 0736  BP:  (!) 98/59 114/85   Pulse:  77 74   Resp:  (!) 24 (!) 23   Temp:  97.9 F (36.6 C) 97.6 F (36.4 C)   TempSrc:  Oral Oral   SpO2: 95% 91% 94% 100%  Weight:   (!) 167 kg   Height:        Intake/Output Summary (Last 24 hours) at 01/26/2023 0749 Last data filed at 01/26/2023 0448 Gross per 24 hour  Intake 902 ml  Output 1300 ml  Net -398 ml   Filed Weights   01/25/23  1445 01/26/23 0448  Weight: (!) 165.1 kg (!) 167 kg    Examination:  General exam: Appears calm and comfortable.  Looks chronically ill and deconditioned.  On 7 L oxygen via nasal cannula. Respiratory system: Bilateral decreased breath sounds at bases with scattered crackles and intermittent tachypnea Cardiovascular system: S1 & S2 heard, Rate  controlled currently Gastrointestinal system: Abdomen is morbidly obese, distended, soft and nontender. Normal bowel sounds heard. Extremities: No cyanosis, clubbing; bilateral lower extremity edema present.   Central nervous system: Alert and oriented.  Slow to respond.  Poor historian.  No focal neurological deficits. Moving extremities Skin: Bilateral lower extremities with chronic skin changes.  No obvious ecchymosis noted  psychiatry: Extremely flat affect.  Not agitated currently.    Data Reviewed: I have personally reviewed following labs and imaging studies  CBC: Recent Labs  Lab 01/25/23 1530 01/26/23 0114  WBC 5.5 5.0  NEUTROABS 4.2  --   HGB 6.2* 5.9*  HCT 23.2* 21.9*  MCV 86.2 86.6  PLT 119* 120*   Basic Metabolic Panel: Recent Labs  Lab 01/25/23 1530 01/26/23 0114  NA 133* 132*  K 4.2 3.7  CL 85* 85*  CO2 33* 35*  GLUCOSE 92 93  BUN 21 18  CREATININE 0.97 1.00  CALCIUM 8.5* 8.4*  MG 1.7 1.7   GFR: Estimated Creatinine Clearance: 88.8 mL/min (by C-G formula based on SCr of 1 mg/dL). Liver Function Tests: Recent Labs  Lab 01/25/23 1530 01/26/23 0114  AST 20 15  ALT 14 11  ALKPHOS 34* 30*  BILITOT 0.5 1.2  PROT 6.8 6.1*  ALBUMIN 2.8* 2.6*   No results for input(s): "LIPASE", "AMYLASE" in the last 168 hours. No results for input(s): "AMMONIA" in the last 168 hours. Coagulation Profile: No results for input(s): "INR", "PROTIME" in the last 168 hours. Cardiac Enzymes: No results for input(s): "CKTOTAL", "CKMB", "CKMBINDEX", "TROPONINI" in the last 168 hours. BNP (last 3 results) No results for input(s): "PROBNP" in the last 8760 hours. HbA1C: No results for input(s): "HGBA1C" in the last 72 hours. CBG: No results for input(s): "GLUCAP" in the last 168 hours. Lipid Profile: No results for input(s): "CHOL", "HDL", "LDLCALC", "TRIG", "CHOLHDL", "LDLDIRECT" in the last 72 hours. Thyroid Function Tests: No results for input(s): "TSH", "T4TOTAL",  "FREET4", "T3FREE", "THYROIDAB" in the last 72 hours. Anemia Panel: No results for input(s): "VITAMINB12", "FOLATE", "FERRITIN", "TIBC", "IRON", "RETICCTPCT" in the last 72 hours. Sepsis Labs: No results for input(s): "PROCALCITON", "LATICACIDVEN" in the last 168 hours.  Recent Results (from the past 240 hour(s))  Resp panel by RT-PCR (RSV, Flu A&B, Covid) Anterior Nasal Swab     Status: None   Collection Time: 01/25/23  3:21 PM   Specimen: Anterior Nasal Swab  Result Value Ref Range Status   SARS Coronavirus 2 by RT PCR NEGATIVE NEGATIVE Final   Influenza A by PCR NEGATIVE NEGATIVE Final   Influenza B by PCR NEGATIVE NEGATIVE Final    Comment: (NOTE) The Xpert Xpress SARS-CoV-2/FLU/RSV plus assay is intended as an aid in the diagnosis of influenza from Nasopharyngeal swab specimens and should not be used as a sole basis for treatment. Nasal washings and aspirates are unacceptable for Xpert Xpress SARS-CoV-2/FLU/RSV testing.  Fact Sheet for Patients: BloggerCourse.com  Fact Sheet for Healthcare Providers: SeriousBroker.it  This test is not yet approved or cleared by the [place] FDA and has been authorized for detection and/or diagnosis of SARS-CoV-2 by FDA under an Emergency Use Authorization (EUA). This EUA will  remain in effect (meaning this test can be used) for the duration of the COVID-19 declaration under Section 564(b)(1) of the Act, 21 U.S.C. section 360bbb-3(b)(1), unless the authorization is terminated or revoked.     Resp Syncytial Virus by PCR NEGATIVE NEGATIVE Final    Comment: (NOTE) Fact Sheet for Patients: BloggerCourse.com  Fact Sheet for Healthcare Providers: SeriousBroker.it  This test is not yet approved or cleared by the [place] FDA and has been authorized for detection and/or diagnosis of SARS-CoV-2 by FDA under an Emergency Use  Authorization (EUA). This EUA will remain in effect (meaning this test can be used) for the duration of the COVID-19 declaration under Section 564(b)(1) of the Act, 21 U.S.C. section 360bbb-3(b)(1), unless the authorization is terminated or revoked.  Performed at [facility], [address], [place], [place] [address]          Radiology Studies: DG CHEST PORT 1 VIEW  Result Date: 01/25/2023 CLINICAL DATA:  Shortness of breath. EXAM: PORTABLE CHEST 1 VIEW COMPARISON:  12/05/2022 FINDINGS: Heart is enlarged, stable in configuration. Mildly prominent interstitial markings are similar to prior study. No new consolidations or pleural effusions. Prominent pulmonary artery segments again noted. IMPRESSION: 1. No active disease. 2. Stable cardiomegaly. 3. Prominent pulmonary artery segments. Electronically Signed   By: Norva Pavlov M.D.   On: 01/25/2023 16:50        Scheduled Meds:  amiodarone  200 mg Oral Daily   atorvastatin  40 mg Oral Daily   escitalopram  10 mg Oral Daily   furosemide  40 mg Intravenous BID   ipratropium-albuterol  3 mL Nebulization Q6H   levothyroxine  88 mcg Oral Q0600   metoprolol succinate  12.5 mg Oral Daily   Continuous Infusions:        Glade Lloyd, MD [facility] 01/26/2023, 7:49 AM

## 2023-01-26 NOTE — Significant Event (Signed)
Rapid Response Event Note   Reason for Call :  lethargy  Initial Focused Assessment:  Patient in bed able to answer questions with short answers, + dyspnea. Shallow breathing with rhonchi present; weak productive cough though unable to clear secretions effectively.   118/72 (85) HR 85 RR 22  O2 93% 13L HFNC [place]   Palliative NP and CCM MD Katrinka Blazing at bedside discussing goals of care with patient and sons via phone and in person.  Patient made DNR/DNI. Placed on HHFNC @ 40L, 40%.   Event Summary:  MD Notified: Adaline Sill MD Call Time: 1320 Arrival Time: 1329 End Time: 1430  Cherre Huger Verda Cumins, RN

## 2023-01-26 NOTE — Plan of Care (Signed)
  Problem: Clinical Measurements: Goal: Respiratory complications will improve Outcome: Not Progressing   Problem: Activity: Goal: Risk for activity intolerance will decrease Outcome: Not Progressing   

## 2023-01-26 NOTE — Progress Notes (Signed)
Due to her possible hx of PCN allergy, ok to change zosyn to ceftriaxone instead per Joneen Roach.  Ulyses Southward, PharmD, BCIDP, AAHIVP, CPP Infectious Disease Pharmacist 01/26/2023 12:17 PM

## 2023-01-26 NOTE — Care Plan (Addendum)
Meditation of pt has decreased. Palliative care is at bedside discussing goals of care. RRT has been called and rrt nurse is at bedside evaluating patient.

## 2023-01-26 NOTE — Consult Note (Signed)
NAME:  [name], MRN:  [id number], DOB:  1955-10-01, LOS: 0 ADMISSION DATE:  01/25/2023, CONSULTATION DATE:  5/21 REFERRING MD:  Dr. Hanley Ben, CHIEF COMPLAINT:  hypoxia   History of Present Illness:  67 year old female with PMH as below, which is significant for CKD, cirrhosis, anemia, HFpEF, HTN, PAF, OSA, DM, avascular necrosis R hip, and chronic hypoxic respiratory failure on 5L.  Admission in March for symptomatic anemia during, which she was transfused one unit PRBC. Felt to be related to iron deficiency. She was discharged on PO iron. She presented again 5/20 with complaints of cough and shortness of breath. Was treated outpatient with Cipro for possible pneumonia without improvement. Workup in the ED significant for hemoglobin 6.2, BNP 332.5. She was given lasix, PRBC, and admitted to the hospitalists.  She was started on tx for heart failure and anemia. In the morning hours of 5/21 she developed worsening shortness of breath requiring 15L [place]. PCCM was asked to consult for further evaluation.   Pertinent  Medical History   has a past medical history of Anemia, Arthritis, AVN (avascular necrosis of bone) (HCC) (04/14/2013), Chronic kidney disease, Complication of anesthesia, Decreased mobility, Depression, GERD (gastroesophageal reflux disease), Hep B w/o coma, Hep C w/o coma, chronic (HCC), Hypertension, Hypothyroid, Obesity, Osteoporosis, PAF (paroxysmal atrial fibrillation) (HCC) (03/22/2021), Pneumonia, PONV (postoperative nausea and vomiting), Shortness of breath dyspnea, Superficial thrombophlebitis, and Transfusion history.   Significant Hospital Events: Including procedures, antibiotic start and stop dates in addition to other pertinent events   5/20 admit for anemia, SOB.   Interim History / Subjective:    Objective   Blood pressure 112/73, pulse 85, temperature 98.4 F (36.9 C), temperature source Axillary, resp. rate (!) 21, height 5' 7.5" (1.715 m), weight (!) 167 kg, SpO2 100  %.        Intake/Output Summary (Last 24 hours) at 01/26/2023 1108 Last data filed at 01/26/2023 1000 Gross per 24 hour  Intake 902 ml  Output 1300 ml  Net -398 ml   Filed Weights   01/25/23 1445 01/26/23 0448  Weight: (!) 165.1 kg (!) 167 kg    Examination: General: morbidly obese elderly appearing female in NAD HENT: [place]/AT, PERRL, unable to appreciate JVD Lungs: Distant breath sounds. Diminished.  Cardiovascular: RRR, noMRG Abdomen: Soft NT, ND Extremities: no acute deformity. Diffuse edema/anasarca.  Neuro: Alert, oriented, non-focal  Resolved Hospital Problem list     Assessment & Plan:   Acute on chronic hypoxemic respiratory failure: multifactorial. Heart failure exacerbation possible. With cough and ? Consolidation on CXR may be infectious. Also long smoking history with not formal diagnosis of COPD, but cannot rule out.  - Supplemental oxygen for sat goal 90% - Will try heated high flow - Starting triple therapy nebs - Continued aggressive diuresis - Zosyn for possible PNA, failed outpatient Cipro - Chest PT  Anemia: iron deficiency. With history of cirrhosis may be worthwhile to consider GI evalution.  - Iron supplementation - Consider GI consult.  - Transfuse per primary  Atrial fib HTN HLD - amio - metop - statin  GOC - full code - palliative consulted by primary        Best Practice (right click and "Reselect all SmartList Selections" daily)   Per primary  Labs   CBC: Recent Labs  Lab 01/25/23 1530 01/26/23 0114 01/26/23 0808  WBC 5.5 5.0  --   NEUTROABS 4.2  --   --   HGB 6.2* 5.9* 6.7*  HCT  23.2* 21.9* 23.3*  MCV 86.2 86.6  --   PLT 119* 120*  --     Basic Metabolic Panel: Recent Labs  Lab 01/25/23 1530 01/26/23 0114  NA 133* 132*  K 4.2 3.7  CL 85* 85*  CO2 33* 35*  GLUCOSE 92 93  BUN 21 18  CREATININE 0.97 1.00  CALCIUM 8.5* 8.4*  MG 1.7 1.7   GFR: Estimated Creatinine Clearance: 88.8 mL/min (by C-G formula  based on SCr of 1 mg/dL). Recent Labs  Lab 01/25/23 1530 01/26/23 0114  WBC 5.5 5.0    Liver Function Tests: Recent Labs  Lab 01/25/23 1530 01/26/23 0114  AST 20 15  ALT 14 11  ALKPHOS 34* 30*  BILITOT 0.5 1.2  PROT 6.8 6.1*  ALBUMIN 2.8* 2.6*   No results for input(s): "LIPASE", "AMYLASE" in the last 168 hours. No results for input(s): "AMMONIA" in the last 168 hours.  ABG    Component Value Date/Time   PHART 7.45 01/26/2023 0825   PCO2ART 67 (HH) 01/26/2023 0825   PO2ART 62 (L) 01/26/2023 0825   HCO3 46.9 (H) 01/26/2023 0825   TCO2 33 (H) 03/22/2021 0746   ACIDBASEDEF 0.3 03/23/2021 0815   O2SAT 93.9 01/26/2023 0825     Coagulation Profile: No results for input(s): "INR", "PROTIME" in the last 168 hours.  Cardiac Enzymes: No results for input(s): "CKTOTAL", "CKMB", "CKMBINDEX", "TROPONINI" in the last 168 hours.  HbA1C: Hemoglobin A1C  Date/Time Value Ref Range Status  03/24/2018 02:02 PM 5.6 4.0 - 5.6 % Final  08/26/2015 05:20 PM 5.70  Final   Hgb A1c MFr Bld  Date/Time Value Ref Range Status  09/01/2012 10:38 AM 5.7 (H) <5.7 % Final    Comment:    (NOTE)                                                                       According to the ADA Clinical Practice Recommendations for 2011, when HbA1c is used as a screening test:  >=6.5%   Diagnostic of Diabetes Mellitus           (if abnormal result is confirmed) 5.7-6.4%   Increased risk of developing Diabetes Mellitus References:Diagnosis and Classification of Diabetes Mellitus,Diabetes Care,2011,34(Suppl 1):S62-S69 and Standards of Medical Care in         Diabetes - 2011,Diabetes Care,2011,34 (Suppl 1):S11-S61.  07/25/2009 09:31 PM 5.6 4.6 - 6.1 % Final    Comment:    See lab report for associated comment(s)    CBG: No results for input(s): "GLUCAP" in the last 168 hours.  Review of Systems:   Bolds are positive  Constitutional: weight loss, gain, night sweats, Fevers, chills, fatigue .   HEENT: headaches, Sore throat, sneezing, nasal congestion, post nasal drip, Difficulty swallowing, Tooth/dental problems, visual complaints visual changes, ear ache CV:  chest pain, radiates:,Orthopnea, PND, swelling in lower extremities, dizziness, palpitations, syncope.  GI  heartburn, indigestion, abdominal pain, nausea, vomiting, diarrhea, change in bowel habits, loss of appetite, bloody stools.  Resp: cough, productive: , hemoptysis, dyspnea, chest pain, pleuritic.  Skin: rash or itching or icterus GU: dysuria, change in color of urine, urgency or frequency. flank pain, hematuria  MS: joint pain or swelling. decreased range of motion  Psych: change in mood or affect. depression or anxiety.  Neuro: difficulty with speech, weakness, numbness, ataxia    Past Medical History:  She,  has a past medical history of Anemia, Arthritis, AVN (avascular necrosis of bone) (HCC) (04/14/2013), Chronic kidney disease, Complication of anesthesia, Decreased mobility, Depression, GERD (gastroesophageal reflux disease), Hep B w/o coma, Hep C w/o coma, chronic (HCC), Hypertension, Hypothyroid, Obesity, Osteoporosis, PAF (paroxysmal atrial fibrillation) (HCC) (03/22/2021), Pneumonia, PONV (postoperative nausea and vomiting), Shortness of breath dyspnea, Superficial thrombophlebitis, and Transfusion history.   Surgical History:   Past Surgical History:  Procedure Laterality Date   ABDOMINAL HYSTERECTOMY     APPENDECTOMY     thinks it was removed with hysterectomy   arthroscopies     knee-right x 7   CARDIOVERSION N/A 03/26/2021   Procedure: CARDIOVERSION;  Surgeon: Jake Bathe, MD;  Location: [facility] ENDOSCOPY;  Service: Cardiovascular;  Laterality: N/A;   CHOLECYSTECTOMY     COLON SURGERY     COLONOSCOPY     COLONOSCOPY WITH PROPOFOL N/A 02/26/2015   Procedure: COLONOSCOPY WITH PROPOFOL;  Surgeon: Louis Meckel, MD;  Location: [facility] ENDOSCOPY;  Service: Endoscopy;  Laterality: N/A;   COLONOSCOPY WITH  PROPOFOL N/A 03/03/2016   Procedure: COLONOSCOPY WITH PROPOFOL;  Surgeon: Napoleon Form, MD;  Location: [facility] ENDOSCOPY;  Service: Endoscopy;  Laterality: N/A;   ESOPHAGOGASTRODUODENOSCOPY (EGD) WITH PROPOFOL N/A 03/03/2016   Procedure: ESOPHAGOGASTRODUODENOSCOPY (EGD) WITH PROPOFOL;  Surgeon: Napoleon Form, MD;  Location: [facility] ENDOSCOPY;  Service: Endoscopy;  Laterality: N/A;   JOINT REPLACEMENT     right knee   TEE WITHOUT CARDIOVERSION N/A 03/26/2021   Procedure: TRANSESOPHAGEAL ECHOCARDIOGRAM (TEE);  Surgeon: Jake Bathe, MD;  Location: [facility] ENDOSCOPY;  Service: Cardiovascular;  Laterality: N/A;   TOTAL KNEE ARTHROPLASTY       Social History:   reports that she has been smoking cigarettes. She has a 10.00 pack-year smoking history. She has never used smokeless tobacco. She reports that she does not drink alcohol and does not use drugs.   Family History:  Her family history includes Cancer in her father and mother; Diabetes in her brother and mother; Heart disease in her mother; Hypertension in her brother, mother, and sister. There is no history of Colon cancer.   Allergies Allergies  Allergen Reactions   Penicillins Anaphylaxis and Other (See Comments)    Has patient had a PCN reaction causing immediate rash, facial/tongue/throat swelling, SOB or lightheadedness with hypotension: Yes Has patient had a PCN reaction causing severe rash involving mucus membranes or skin necrosis: No Has patient had a PCN reaction that required hospitalization: Yes Has patient had a PCN reaction occurring within the last 10 years: No If all of the above answers are "NO", then may proceed with Cephalosporin use.    Demerol Nausea And Vomiting   Meperidine Nausea And Vomiting   Morphine And Codeine Nausea And Vomiting     Home Medications  Prior to Admission medications   Medication Sig Start Date End Date Taking? Authorizing Provider  acetaminophen (TYLENOL) 160 MG/5ML solution Take 20 mLs by  mouth as needed for mild pain or moderate pain.   Yes [provider]  albuterol (PROVENTIL) (2.5 MG/3ML) 0.083% nebulizer solution Take by nebulization. 12/05/22  Yes [provider]  albuterol (VENTOLIN HFA) 108 (90 Base) MCG/ACT inhaler Inhale into the lungs. 12/05/22  Yes [provider]  amiodarone (PACERONE) 200 MG tablet Take one tablet twice daily until 04/08/21, then change to one tablet  daily starting on 04/09/21. Patient taking differently: Take 200 mg by mouth daily. 03/27/21  Yes Arrien, York Ram, MD  apixaban (ELIQUIS) 5 MG TABS tablet Take 5 mg by mouth 2 (two) times daily.   Yes [provider]  atorvastatin (LIPITOR) 40 MG tablet Take 40 mg by mouth daily.   Yes [provider]  escitalopram (LEXAPRO) 10 MG tablet TAKE 1 TABLET BY MOUTH EVERY DAY. Need TO keep appointment in June FOR future refills Patient taking differently: Take 10 mg by mouth daily. 12/13/19  Yes Grayce Sessions, NP  ferrous sulfate 325 (65 FE) MG EC tablet Take 1 tablet (325 mg total) by mouth 2 (two) times daily. 12/06/22 01/25/23 Yes Azucena Fallen, MD  furosemide (LASIX) 40 MG tablet Take 40 mg by mouth daily.   Yes [provider]  levothyroxine (SYNTHROID) 88 MCG tablet Take 88 mcg by mouth daily. 06/08/22  Yes [provider]  metFORMIN (GLUCOPHAGE-XR) 500 MG 24 hr tablet Take 1,000 mg by mouth daily with breakfast. 09/11/22  Yes [provider]  metoprolol succinate (TOPROL-XL) 25 MG 24 hr tablet TAKE 1/2 TABLET BY MOUTH EVERY DAY 12/01/22  Yes Chilton Si, MD  omeprazole (PRILOSEC) 40 MG capsule Take 1 capsule (40 mg total) by mouth daily as needed (acid reflux). Patient taking differently: Take 40 mg by mouth daily. 10/16/19  Yes Grayce Sessions, NP  potassium chloride SA (KLOR-CON M) 20 MEQ tablet Take 20 mEq by mouth every evening.   Yes [provider]  sodium chloride (OCEAN) 0.65 % SOLN nasal spray Place 1  spray into both nostrils as needed for congestion.   Yes [provider]  acetaminophen (TYLENOL) 325 MG tablet Take 2 tablets (650 mg total) by mouth every 6 (six) hours as needed for mild pain (or Fever >/= 101). Patient not taking: Reported on 01/25/2023 03/27/21   Arrien, York Ram, MD  benzonatate (TESSALON) 200 MG capsule Take 1 capsule by mouth 3 (three) times daily. Patient not taking: Reported on 01/25/2023    [provider]  ciprofloxacin (CIPRO) 500 MG tablet Take 500 mg by mouth 2 (two) times daily. Patient not taking: Reported on 01/25/2023    [provider]     Critical care time:      Joneen Roach, AGACNP-BC [facility]  See Amion for personal pager PCCM on call pager [phone] until 7pm. Please call Elink 7p-7a. [phone]  01/26/2023 11:43 AM

## 2023-01-26 NOTE — Consult Note (Addendum)
Consultation Note Date: 01/26/2023 at 1300   Patient Name: [name]  DOB: 1955/10/10  MRN: [id number]  Age / Sex: 67 y.o., female  PCP: Annita Brod, MD Referring Physician: Glade Lloyd, MD  Reason for Consultation: Establishing goals of care  HPI/Patient Profile: 67 y.o. female  with past medical history of chronic diastolic heart failure (EF 55 to 60%), anemia, chronic respiratory failure (5L of [place] at home), paroxysmal A-fib, HTN, depression, hypothyroidism, cirrhosis of the liver, chronic thrombocytopenia, squamous cell cancer of the face status postradiation, avascular necrosis of right hip with wheelchair-bound status, and recent hospitalization for symptomatic anemia admitted on 01/25/2023 with worsening cough and shortness of breath for the last 2 weeks.  Patient is being treated a on C DHF (Lasix, nebs, supplemental oxygen) and anemia (status post 1 unit RBC-hbg this a.m. 5/21 of 6.7).  Clinical Assessment and Goals of Care: I have reviewed medical records including EPIC notes, labs and imaging, assessed the patient and then met with patient, her son Jorja Loa and his wife in person, and her son Dorene Sorrow over the phone with Dr. Katrinka Blazing to discuss diagnosis prognosis, GOC, EOL wishes, disposition and options.  I introduced Palliative Medicine as specialized medical care for people living with serious illness. It focuses on providing relief from the symptoms and stress of a serious illness. The goal is to improve quality of life for both the patient and the family.  We discussed a brief life review of the patient. Patient has been with her significant other for 46 years. She speaks of him fondly but states her sons are her [facility] decision makers.   As far as functional and nutritional status patient endorses a significant decline over the past 2 weeks, with increasing difficulty with transferring out of her  wheelchair and needing max assistance with daily cares and ADLs.   We discussed patient's current illness and what it means in the larger context of patient's on-going co-morbidities.  We discussed CHF, anemia, cirrhosis of the liver, and patent's overall poor functional status as significant indiactors of prognosis.   I attempted to elicit values and goals of care important to the patient.  Patient states she wants to go home and do whatever is necessary to not be a burden to her children. She is not willing to give concrete answers to boundaries of her care as she wants her children to make decisions for her.   Advance directives, concepts specific to code status, artificial feeding and hydration, and rehospitalization were considered and discussed. Patient's sons endorses DNR and DNI status. Code status changed to DNR with limited interventions and RN made aware.   Sons want to treat the treatable and give patient time for outcomes. Agreement made to give all medical treatments up until the point of transferring to the ICU/intubating.   Family and medical team in agreement to give patient time for outcomes - 24H - to see if she will improve or continue to decline. If patient deteriorates, family made aware that they will be  contacted in regards to proceeding with comfort measures.   Questions and concerns were addressed. The family was encouraged to call with questions or concerns.   PMT will continue to follow.   Primary Decision Maker NEXT OF KIN  Physical Exam Vitals reviewed.  Constitutional:      General: She is in acute distress.     Appearance: She is ill-appearing.  HENT:     Head: Normocephalic.  Cardiovascular:     Rate and Rhythm: Normal rate.  Pulmonary:     Breath sounds: Examination of the left-middle field reveals wheezing. Examination of the right-lower field reveals decreased breath sounds. Examination of the left-lower field reveals decreased breath sounds. Decreased  breath sounds and wheezing present.  Neurological:     Mental Status: She is alert and oriented to person, place, and time.  Psychiatric:        Mood and Affect: Mood is not anxious.        Behavior: Behavior normal. Behavior is not agitated.     Palliative Assessment/Data:30%     Thank you for this consult. Palliative medicine will continue to follow and assist holistically.   Time Total: 75 minutes Greater than 50%  of this time was spent counseling and coordinating care related to the above assessment and plan.  Signed by: Georgiann Cocker, DNP, FNP-BC Palliative Medicine    Please contact Palliative Medicine Team phone at [phone] for questions and concerns.  For individual provider: See Loretha Stapler

## 2023-01-27 DIAGNOSIS — J9621 Acute and chronic respiratory failure with hypoxia: Secondary | ICD-10-CM | POA: Diagnosis not present

## 2023-01-27 DIAGNOSIS — J9622 Acute and chronic respiratory failure with hypercapnia: Secondary | ICD-10-CM | POA: Diagnosis not present

## 2023-01-27 DIAGNOSIS — I5033 Acute on chronic diastolic (congestive) heart failure: Secondary | ICD-10-CM | POA: Diagnosis not present

## 2023-01-27 DIAGNOSIS — Z515 Encounter for palliative care: Secondary | ICD-10-CM | POA: Diagnosis not present

## 2023-01-27 LAB — MRSA NEXT GEN BY PCR, NASAL: MRSA by PCR Next Gen: NOT DETECTED

## 2023-01-27 LAB — CBC WITH DIFFERENTIAL/PLATELET
Abs Immature Granulocytes: 0.05 10*3/uL (ref 0.00–0.07)
Basophils Absolute: 0 10*3/uL (ref 0.0–0.1)
Basophils Relative: 0 %
Eosinophils Absolute: 0 10*3/uL (ref 0.0–0.5)
Eosinophils Relative: 0 %
HCT: 25.5 % — ABNORMAL LOW (ref 36.0–46.0)
Hemoglobin: 7.3 g/dL — ABNORMAL LOW (ref 12.0–15.0)
Immature Granulocytes: 1 %
Lymphocytes Relative: 14 %
Lymphs Abs: 1 10*3/uL (ref 0.7–4.0)
MCH: 24.1 pg — ABNORMAL LOW (ref 26.0–34.0)
MCHC: 28.6 g/dL — ABNORMAL LOW (ref 30.0–36.0)
MCV: 84.2 fL (ref 80.0–100.0)
Monocytes Absolute: 0.7 10*3/uL (ref 0.1–1.0)
Monocytes Relative: 10 %
Neutro Abs: 5.1 10*3/uL (ref 1.7–7.7)
Neutrophils Relative %: 75 %
Platelets: 105 10*3/uL — ABNORMAL LOW (ref 150–400)
RBC: 3.03 MIL/uL — ABNORMAL LOW (ref 3.87–5.11)
RDW: 18.3 % — ABNORMAL HIGH (ref 11.5–15.5)
WBC: 6.9 10*3/uL (ref 4.0–10.5)
nRBC: 0 % (ref 0.0–0.2)

## 2023-01-27 LAB — TYPE AND SCREEN
ABO/RH(D): A POS
Antibody Screen: NEGATIVE
Unit division: 0

## 2023-01-27 LAB — BASIC METABOLIC PANEL
Anion gap: 12 (ref 5–15)
BUN: 16 mg/dL (ref 8–23)
CO2: 35 mmol/L — ABNORMAL HIGH (ref 22–32)
Calcium: 8.3 mg/dL — ABNORMAL LOW (ref 8.9–10.3)
Chloride: 85 mmol/L — ABNORMAL LOW (ref 98–111)
Creatinine, Ser: 0.93 mg/dL (ref 0.44–1.00)
GFR, Estimated: >60 mL/min (ref >60)
Glucose, Bld: 94 mg/dL (ref 70–99)
Potassium: 4 mmol/L (ref 3.5–5.1)
Sodium: 132 mmol/L — ABNORMAL LOW (ref 135–145)

## 2023-01-27 LAB — MAGNESIUM: Magnesium: 2.1 mg/dL (ref 1.7–2.4)

## 2023-01-27 LAB — BLOOD GAS, ARTERIAL
Acid-Base Excess: 15.4 mmol/L — ABNORMAL HIGH (ref 0.0–2.0)
Bicarbonate: 46.3 mmol/L — ABNORMAL HIGH (ref 20.0–28.0)
Drawn by: 137461
O2 Saturation: 99.1 %
Patient temperature: 36.4
pCO2 arterial: 90 mmHg (ref 32–48)
pH, Arterial: 7.32 — ABNORMAL LOW (ref 7.35–7.45)
pO2, Arterial: 89 mmHg (ref 83–108)

## 2023-01-27 LAB — AMMONIA: Ammonia: 37 umol/L — ABNORMAL HIGH (ref 9–35)

## 2023-01-27 LAB — BPAM RBC
Blood Product Expiration Date: 202405252359
Blood Product Expiration Date: 202406152359
ISSUE DATE / TIME: 202405210944
Unit Type and Rh: 600

## 2023-01-27 MED ORDER — HALOPERIDOL LACTATE 2 MG/ML PO CONC: 0.5000 mg | ORAL | Status: DC | PRN

## 2023-01-27 MED ORDER — ACETAMINOPHEN 650 MG RE SUPP: 650.0000 mg | Freq: Four times a day (QID) | RECTAL | Status: DC | PRN

## 2023-01-27 MED ORDER — GLYCOPYRROLATE 0.2 MG/ML IJ SOLN: 0.2000 mg | INTRAMUSCULAR | Status: DC | PRN

## 2023-01-27 MED ORDER — SODIUM CHLORIDE 0.9 % IV SOLN
1.0000 mg/h | INTRAVENOUS | Status: DC
  Administered 2023-01-27: 1 mg/h via INTRAVENOUS
  Filled 2023-01-27: qty 5

## 2023-01-27 MED ORDER — POLYVINYL ALCOHOL 1.4 % OP SOLN: 1.0000 [drp] | Freq: Four times a day (QID) | OPHTHALMIC | Status: DC | PRN

## 2023-01-27 MED ORDER — ACETAMINOPHEN 325 MG PO TABS: 650.0000 mg | ORAL_TABLET | Freq: Four times a day (QID) | ORAL | Status: DC | PRN

## 2023-01-27 MED ORDER — LORAZEPAM 2 MG/ML IJ SOLN: 2.0000 mg | INTRAMUSCULAR | Status: DC | PRN

## 2023-01-27 MED ORDER — ALBUMIN HUMAN 25 % IV SOLN
25.0000 g | Freq: Four times a day (QID) | INTRAVENOUS | Status: DC
  Administered 2023-01-27: 25 g via INTRAVENOUS
  Filled 2023-01-27: qty 100

## 2023-01-27 MED ORDER — HYDROMORPHONE HCL 1 MG/ML IJ SOLN
2.0000 mg | INTRAMUSCULAR | Status: AC
  Administered 2023-01-27: 2 mg via INTRAVENOUS
  Filled 2023-01-27: qty 2

## 2023-01-27 MED ORDER — LORAZEPAM 2 MG/ML IJ SOLN
2.0000 mg | INTRAMUSCULAR | Status: AC
  Administered 2023-01-27: 2 mg via INTRAVENOUS
  Filled 2023-01-27: qty 1

## 2023-01-27 MED ORDER — HALOPERIDOL LACTATE 5 MG/ML IJ SOLN: 0.5000 mg | INTRAMUSCULAR | Status: DC | PRN

## 2023-01-27 MED ORDER — HYDROMORPHONE BOLUS VIA INFUSION: 2.0000 mg | INTRAVENOUS | Status: DC | PRN

## 2023-01-27 MED ORDER — ONDANSETRON 4 MG PO TBDP: 4.0000 mg | ORAL_TABLET | Freq: Four times a day (QID) | ORAL | Status: DC | PRN

## 2023-01-27 MED ORDER — HALOPERIDOL 0.5 MG PO TABS: 0.5000 mg | ORAL_TABLET | ORAL | Status: DC | PRN

## 2023-01-27 MED ORDER — LORAZEPAM 2 MG/ML IJ SOLN: 2.0000 mg | INTRAMUSCULAR | Status: DC

## 2023-01-27 MED ORDER — FUROSEMIDE 10 MG/ML IJ SOLN: 80.0000 mg | Freq: Four times a day (QID) | INTRAMUSCULAR | Status: DC

## 2023-01-27 MED ORDER — ONDANSETRON HCL 4 MG/2ML IJ SOLN: 4.0000 mg | Freq: Four times a day (QID) | INTRAMUSCULAR | Status: DC | PRN

## 2023-01-27 MED ORDER — METHYLPREDNISOLONE SODIUM SUCC 40 MG IJ SOLR
40.0000 mg | Freq: Two times a day (BID) | INTRAMUSCULAR | Status: DC
  Administered 2023-01-27: 40 mg via INTRAVENOUS
  Filled 2023-01-27: qty 1

## 2023-01-27 MED ORDER — GLYCOPYRROLATE 1 MG PO TABS: 1.0000 mg | ORAL_TABLET | ORAL | Status: DC | PRN

## 2023-02-06 NOTE — Consult Note (Signed)
   NAME:  [name], MRN:  [id number], DOB:  Nov 15, 1955, LOS: 1 ADMISSION DATE:  01/25/2023, CONSULTATION DATE:  5/21 REFERRING MD:  Dr. Hanley Ben, CHIEF COMPLAINT:  hypoxia   History of Present Illness:  67 year old female with PMH as below, which is significant for CKD, cirrhosis, anemia, HFpEF, HTN, PAF, OSA, DM, avascular necrosis R hip, and chronic hypoxic respiratory failure on 5L.  Admission in March for symptomatic anemia during, which she was transfused one unit PRBC. Felt to be related to iron deficiency. She was discharged on PO iron. She presented again 5/20 with complaints of cough and shortness of breath. Was treated outpatient with Cipro for possible pneumonia without improvement. Workup in the ED significant for hemoglobin 6.2, BNP 332.5. She was given lasix, PRBC, and admitted to the hospitalists.  She was started on tx for heart failure and anemia. In the morning hours of 5/21 she developed worsening shortness of breath requiring 15L [place]. PCCM was asked to consult for further evaluation.   Pertinent  Medical History   has a past medical history of Anemia, Arthritis, AVN (avascular necrosis of bone) (HCC) (04/14/2013), Chronic kidney disease, Complication of anesthesia, Decreased mobility, Depression, GERD (gastroesophageal reflux disease), Hep B w/o coma, Hep C w/o coma, chronic (HCC), Hypertension, Hypothyroid, Obesity, Osteoporosis, PAF (paroxysmal atrial fibrillation) (HCC) (03/22/2021), Pneumonia, PONV (postoperative nausea and vomiting), Shortness of breath dyspnea, Superficial thrombophlebitis, and Transfusion history.   Significant Hospital Events: Including procedures, antibiotic start and stop dates in addition to other pertinent events   5/20 admit for anemia, SOB.   Interim History / Subjective:  Remains tenuous but states feels slightly better than yesterday.  Objective   Blood pressure 99/61, pulse 84, temperature 98.4 F (36.9 C), temperature source Oral, resp. rate  (!) 30, height 5' 7.5" (1.715 m), weight (!) 165.8 kg, SpO2 93 %.    Vent Mode: BIPAP;PCV FiO2 (%):  [40 %-80 %] 80 % Set Rate:  [12 bmp] 12 bmp PEEP:  [5 cmH20] 5 cmH20   Intake/Output Summary (Last 24 hours) at 02/01/2023 1130 Last data filed at 01/09/2023 4098 Gross per 24 hour  Intake 1110 ml  Output 1200 ml  Net -90 ml    Filed Weights   01/25/23 1445 01/26/23 0448 01/26/2023 0445  Weight: (!) 165.1 kg (!) 167 kg (!) 165.8 kg    Examination: Chronically ill on optiflow She has bilateral wheezing and accessory muscle use on optiflow Ext with chronic lymphedema Able to follow commands  H/h better  Resolved Hospital Problem list     Assessment & Plan:   Acute on chronic hypoxemic respiratory failure- some combination of fluid overload and aspiration pneumonitis.  Complicated by baseline extremely poor reserve (OSA/COPD/CKD/cirrhosis). - Push diuresis - Continue abx - Can try BIPAP PRN - Start steroids for empiric bronchospasm - If any setbacks recommend transition to comfort care, will follow with you  Myrla Halsted MD PCCM

## 2023-02-06 NOTE — Progress Notes (Signed)
Called by RN to come check Bipap. Patient has been unable to tolerate. Placed back on HHFNC, patient more comfortable now. Will continue to monitor.

## 2023-02-06 NOTE — TOC Initial Note (Signed)
Transition of Care [facility]) - Initial/Assessment Note    Patient Details  Name: [name] MRN: [id number] Date of Birth: 02-10-1956  [facility]) CM/SW Contact:    Leander Rams, LCSW Phone Number: 02/03/2023, 2:58 PM  Clinical Narrative:                 CSW met with pt at bedside along with pt son, daughter in law and significant other present. Pt was on bipap and unable to speak. Family reports that prior to this admission pt was wheelchair bound and on oxygen. CSW could not complete assessment as nurse was attempting to work on complications with bipap and called respiratory therapist.   CSW will return to discuss disposition. TOC will continue to follow.    Expected Discharge Plan: Skilled Nursing Facility Barriers to Discharge: Continued Medical Work up   Patient Goals and CMS Choice            Expected Discharge Plan and Services       Living arrangements for the past 2 months: Single Family Home                                      Prior Living Arrangements/Services Living arrangements for the past 2 months: Single Family Home Lives with:: Self, Significant Other Patient language and need for interpreter reviewed::  (Assessment completed with family)            Current home services: DME, Homehealth aide    Activities of Daily Living      Permission Sought/Granted                  Emotional Assessment   Attitude/Demeanor/Rapport: Unable to Assess (Assessment completed in room with family) Affect (typically observed): Unable to Assess (Assessment completed in room with family)   Alcohol / Substance Use: Not Applicable Psych Involvement: No (comment)  Admission diagnosis:  CHF exacerbation (HCC) [I50.9] Patient Active Problem List   Diagnosis Date Noted   CHF exacerbation (HCC) 01/25/2023   Chronic respiratory failure with hypoxia (HCC) 12/06/2022   Symptomatic anemia 12/05/2022   Squamous cell skin cancer, face  06/09/2022   Acute on chronic diastolic heart failure (HCC) 03/26/2021   PAF (paroxysmal atrial fibrillation) (HCC) 03/22/2021   Acute hypercapnic respiratory failure (HCC) 03/22/2021   Diarrhea 03/22/2021   Ankle fracture, bimalleolar, closed, right, initial encounter 05/11/2018   Depression with anxiety 03/24/2018   Osteopenia 02/15/2017   Dyslipidemia with high LDL and low HDL 02/01/2017   Morbid obesity (HCC) 02/01/2017   Seborrheic keratoses 01/25/2017   Filiform wart 01/25/2017   Left carpal tunnel syndrome 03/23/2016   Benign neoplasm of transverse colon    Benign neoplasm of descending colon    Benign neoplasm of ascending colon    Gastroesophageal reflux disease with esophagitis    Depression 12/24/2015   Numbness and tingling in left hand 12/24/2015   History of hepatitis C 08/26/2015   Elevated hemoglobin A1c 08/26/2015   Chronic diarrhea 05/28/2015   Benign neoplasm of colon 02/26/2015   Varicose veins of both lower extremities with complications 01/11/2015   Chronic venous insufficiency 01/11/2015   Hepatic cirrhosis (HCC) 08/07/2014   Change in bowel habits 07/19/2014   Essential hypertension, benign 03/19/2014   Smoking 03/19/2014   Numerous moles 03/19/2014   Leg length discrepancy 04/14/2013   HTN (hypertension) 03/28/2013   Vitamin D deficiency  02/02/2012   Degenerative joint disease 02/02/2012   Osteoporosis 02/02/2012   Hepatitis C 02/02/2012   CLAUDICATION 04/29/2009   Hypothyroidism 04/25/2009   Thrombocytopenia (HCC) 04/25/2009   PCP:  Annita Brod, MD Pharmacy:   [facility] [place], [place] - [address] [place] [address] Phone: [phone] Fax: [phone]     Social Determinants of Health (SDOH) Social History: SDOH Screenings   Food Insecurity: No Food Insecurity (12/06/2022)  Housing: Low Risk  (12/06/2022)  Transportation Needs: Unmet Transportation Needs (12/06/2022)  Utilities: Not At Risk  (12/06/2022)  Depression (PHQ2-9): Low Risk  (08/23/2019)  Financial Resource Strain: Medium Risk (08/23/2019)  Physical Activity: Inactive (08/23/2019)  Social Connections: Socially Isolated (03/17/2019)  Stress: Stress Concern Present (03/17/2019)  Tobacco Use: High Risk (01/25/2023)   SDOH Interventions:     Readmission Risk Interventions     No data to display         Oletta Lamas, MSW, LCSWA, LCASA Transitions of Care  Clinical Social Worker I

## 2023-02-06 NOTE — Progress Notes (Signed)
Date and time results received: 01/07/2023 1016   Test: CO2 Critical Value: 90  Name of Provider Notified: Mitchel Honour, MD  Orders Received? Or Actions Taken?: Notified MD at 1026

## 2023-02-06 NOTE — Progress Notes (Signed)
Daily Progress Note   Patient Name: [name]       Date: 01/13/2023 DOB: 09/05/1956  Age: 67 y.o. MRN#: [id number] Attending Physician: Zannie Cove, MD Primary Care Physician: Annita Brod, MD Admit Date: 01/25/2023  Reason for Consultation/Follow-up: Establishing goals of care  Patient Profile/HPI:  67 y.o. female  with past medical history of chronic diastolic heart failure (EF 55 to 60%), anemia, chronic respiratory failure (5L of [place] at home), paroxysmal A-fib, HTN, depression, hypothyroidism, cirrhosis of the liver, chronic thrombocytopenia, squamous cell cancer of the face status postradiation, avascular necrosis of right hip with wheelchair-bound status, and recent hospitalization for symptomatic anemia admitted on 01/25/2023 with worsening cough and shortness of breath for the last 2 weeks. Admitted for acute on chronic hypoxic and hypercarbic respiratory failure, respiratory acidosis. She is on high flow oxygen, bipap was ordered. Palliative medicine consulted for GOC.   Palliative consulted on 5/21 and plan made to continue current measures, no escalation of care, and transition to comfort if patient decompensated.    Subjective: Called by RN due to patient respiratory status worsening, not tolerating bipap and patient requesting comfort measures only.  Met with patient. She reported feeling very short of breath, stating "I can't breathe". She was very clear that she did not want to continue current interventions and she just wanted to be comfortable and allowed to die peacefully and "unaware". She said she knew the next step was "morphine" and she wanted to start that. I asked when she wanted to start comfort measures and she said she wanted to start right now. She asked if she  could remove her oxygen while I was at her bedside but she agreed to keep it on while we called her family and until we started comfort measure medications. She said she was anxious and asked for anxiety medications- she received 0.5mg  po lorazepam but it did not calm her.  I discussed possible palliative sedation with propofol and she agreed to that if needed.  I spoke to her significant other [name], son [name] and son [name]. Reviewed my discussion with [name] with them and the plan to transition to full comfort measures only- they are in agreement and are en route to hospital.   Review of Systems  Constitutional:  Positive for malaise/fatigue.  Respiratory:  Positive for  shortness of breath.      Physical Exam Vitals and nursing note reviewed.  Constitutional:      General: She is in acute distress.     Appearance: She is ill-appearing.  Pulmonary:     Effort: Respiratory distress present.  Skin:    Coloration: Skin is pale.  Neurological:     Mental Status: She is oriented to person, place, and time.             Vital Signs: BP 116/62 (BP Location: Right Arm)   Pulse 84   Temp 97.8 F (36.6 C) (Oral)   Resp (!) 25   Ht 5' 7.5" (1.715 m)   Wt (!) 165.8 kg   SpO2 92%   BMI 56.40 kg/m  SpO2: SpO2: 92 % O2 Device: O2 Device: High Flow Nasal Cannula O2 Flow Rate: O2 Flow Rate (L/min): 40 L/min  Intake/output summary:  Intake/Output Summary (Last 24 hours) at 01/31/2023 1600 Last data filed at 02/04/2023 1456 Gross per 24 hour  Intake 1032.91 ml  Output 1200 ml  Net -167.09 ml   LBM: Last BM Date : 01/25/23 Baseline Weight: Weight: (!) 165.1 kg Most recent weight: Weight: (!) 165.8 kg       Palliative Assessment/Data: PPS: 20%      Patient Active Problem List   Diagnosis Date Noted   CHF exacerbation (HCC) 01/25/2023   Chronic respiratory failure with hypoxia (HCC) 12/06/2022   Symptomatic anemia 12/05/2022   Squamous cell skin cancer, face 06/09/2022   Acute on  chronic diastolic heart failure (HCC) 03/26/2021   PAF (paroxysmal atrial fibrillation) (HCC) 03/22/2021   Acute hypercapnic respiratory failure (HCC) 03/22/2021   Diarrhea 03/22/2021   Ankle fracture, bimalleolar, closed, right, initial encounter 05/11/2018   Depression with anxiety 03/24/2018   Osteopenia 02/15/2017   Dyslipidemia with high LDL and low HDL 02/01/2017   Morbid obesity (HCC) 02/01/2017   Seborrheic keratoses 01/25/2017   Filiform wart 01/25/2017   Left carpal tunnel syndrome 03/23/2016   Benign neoplasm of transverse colon    Benign neoplasm of descending colon    Benign neoplasm of ascending colon    Gastroesophageal reflux disease with esophagitis    Depression 12/24/2015   Numbness and tingling in left hand 12/24/2015   History of hepatitis C 08/26/2015   Elevated hemoglobin A1c 08/26/2015   Chronic diarrhea 05/28/2015   Benign neoplasm of colon 02/26/2015   Varicose veins of both lower extremities with complications 01/11/2015   Chronic venous insufficiency 01/11/2015   Hepatic cirrhosis (HCC) 08/07/2014   Change in bowel habits 07/19/2014   Essential hypertension, benign 03/19/2014   Smoking 03/19/2014   Numerous moles 03/19/2014   Leg length discrepancy 04/14/2013   HTN (hypertension) 03/28/2013   Vitamin D deficiency 02/02/2012   Degenerative joint disease 02/02/2012   Osteoporosis 02/02/2012   Hepatitis C 02/02/2012   CLAUDICATION 04/29/2009   Hypothyroidism 04/25/2009   Thrombocytopenia (HCC) 04/25/2009    Palliative Care Assessment & Plan    Assessment/Recommendations/Plan  Transition to full comfort measures only 2mg  IV lorazepam now and then as scheduled Start hydromorphone infusion with bolus doses Start lorazepam 2mg  IV q4hrs Other comfort medications as ordered Begin titration off high flow oxygen when patient is well sedated If comfort cannot be obtained with hydromorphone and lorazepam recommed transfer to ICU for palliative  sedation with propofol   Code Status: DNR  Prognosis:  Hours - Days  Discharge Planning: Anticipated Hospital Death  Care plan was discussed with patient,  family, and care team.  Thank you for allowing the Palliative Medicine Team to assist in the care of this patient.  Total time: 90 minutes  Greater than 50%  of this time was spent counseling and coordinating care related to the above assessment and plan.  Ocie Bob, AGNP-C Palliative Medicine   Please contact Palliative Medicine Team phone at [phone] for questions and concerns.

## 2023-02-06 NOTE — Progress Notes (Signed)
PROGRESS NOTE    [name]  ZOX:[id number] DOB: Jan 07, 1956 DOA: 01/25/2023 PCP: Annita Brod, MD  67 y.o. female with morbid obesity, BMI 56, untreated OSA, OHS PAF on Eliquis, HFpEF, chronic hypoxic respiratory failure on 5 L [place], T2DM, HTN, hypothyroidism, hepatic cirrhosis, squamous cell cancer of the face s/p radiation, avascular necrosis of right hip with chronic wheelchair-bound status, last admission from 12/05/2022-12/06/2022 for symptomatic anemia requiring transfusion presented with worsening cough and shortness of breath for the last 2 weeks.  On presentation, hemoglobin was 6.2 and was fluid overloaded.  Chest x-ray showed cardiomegaly. BNP of 332.5. She was given IV Lasix.  She was transfused with 1 unit packed red cells. -5/21 AM rapidly decline with worsening hypoxia, repeat chest x-ray with bibasilar consolidation, clinical concern for aspiration, worsening hypercarbic respiratory failure and fluid overload -5/22 somnolent, on 40 L high flow nasal cannula  Subjective: Somnolent but arousable  Assessment and Plan:  Acute on chronic hypoxic and hypercarbic respiratory failure Aspiration pneumonia Diastolic CHF, cirrhosis -Multifactorial, in the background has morbid obesity, OSA/OHS on 5 L O2 at baseline, long smoking history without formal COPD diagnosis -Continue IV Zosyn for possible aspiration pneumonia -IV Lasix as tolerated -Pulmonary toilet, DuoNebs -ABG this morning, with respiratory acidosis, BiPAP ordered -Chest PT, IV Lasix -Palliative consulted, prognosis is poor, PCCM recommended comfort care for any further decline  Severe iron deficiency anemia -Transfused 2 units of PRBC, patient is unable to give a history of melena or hematochezia however considering history of cirrhosis suspect GI blood loss -She is not stable for endoscopic eval at this time -Add PPI, holding Eliquis  Hyponatremia -Mild.  Monitor.   Paroxysmal A-fib -Currently rate controlled.   Continue amiodarone and Toprol-XL.  Eliquis on hold.   Essential hypertension Hyperlipidemia -Continue Toprol-XL. -Continue statin   Depression with anxiety -Continue Lexapro   Hypothyroidism -Continue Synthroid   Cirrhosis of liver -Stable.  Outpatient follow-up with GI   Thrombocytopenia, chronic -Possibly from cirrhosis of liver.  Monitor platelets.   Goals of care -DNR, palliative following, prognosis is guarded to poor  DVT prophylaxis: SCDs Code Status: DNR Family Communication: No family at bedside Disposition Plan: To be determined  Consultants: PCCM, cardiology, palliative care   Procedures:   Antimicrobials:    Objective: Vitals:   01/24/2023 0825 01/26/2023 1000 01/20/2023 1100 01/12/2023 1128  BP: (!) 92/57 97/65  99/61  Pulse: 84     Resp: 20 20 (!) 30 20  Temp: 97.6 F (36.4 C)   98.4 F (36.9 C)  TempSrc: Oral   Oral  SpO2: 95% 92%  93%  Weight:      Height:        Intake/Output Summary (Last 24 hours) at 01/11/2023 1200 Last data filed at 01/13/2023 8119 Gross per 24 hour  Intake 1110 ml  Output 1200 ml  Net -90 ml   Filed Weights   01/25/23 1445 01/26/23 0448 01/25/2023 0445  Weight: (!) 165.1 kg (!) 167 kg (!) 165.8 kg    Examination:  General exam: Chronically ill morbidly obese female sitting up in bed, somnolent but arousable, oriented to self and place only CVS: S1-S2, regular rhythm Lungs: Distant breath sounds, decreased at the bases Abdomen: Soft, obese, nontender, bowel sounds present Extremities: 1+ edema, chronic skin changes Psych: Flat affect Skin: As above   Data Reviewed:   CBC: Recent Labs  Lab 01/25/23 1530 01/26/23 0114 01/26/23 0808 01/26/23 1552 01/16/2023 0714  WBC 5.5 5.0  --   --  6.9  NEUTROABS 4.2  --   --   --  5.1  HGB 6.2* 5.9* 6.7* 7.2* 7.3*  HCT 23.2* 21.9* 23.3* 25.3* 25.5*  MCV 86.2 86.6  --   --  84.2  PLT 119* 120*  --   --  105*   Basic Metabolic Panel: Recent Labs  Lab 01/25/23 1530  01/26/23 0114 01/12/2023 0714  NA 133* 132* 132*  K 4.2 3.7 4.0  CL 85* 85* 85*  CO2 33* 35* 35*  GLUCOSE 92 93 94  BUN 21 18 16   CREATININE 0.97 1.00 0.93  CALCIUM 8.5* 8.4* 8.3*  MG 1.7 1.7 2.1   GFR: Estimated Creatinine Clearance: 95.1 mL/min (by C-G formula based on SCr of 0.93 mg/dL). Liver Function Tests: Recent Labs  Lab 01/25/23 1530 01/26/23 0114  AST 20 15  ALT 14 11  ALKPHOS 34* 30*  BILITOT 0.5 1.2  PROT 6.8 6.1*  ALBUMIN 2.8* 2.6*   No results for input(s): "LIPASE", "AMYLASE" in the last 168 hours. Recent Labs  Lab 01/22/2023 0949  AMMONIA 37*   Coagulation Profile: No results for input(s): "INR", "PROTIME" in the last 168 hours. Cardiac Enzymes: No results for input(s): "CKTOTAL", "CKMB", "CKMBINDEX", "TROPONINI" in the last 168 hours. BNP (last 3 results) No results for input(s): "PROBNP" in the last 8760 hours. HbA1C: No results for input(s): "HGBA1C" in the last 72 hours. CBG: No results for input(s): "GLUCAP" in the last 168 hours. Lipid Profile: No results for input(s): "CHOL", "HDL", "LDLCALC", "TRIG", "CHOLHDL", "LDLDIRECT" in the last 72 hours. Thyroid Function Tests: No results for input(s): "TSH", "T4TOTAL", "FREET4", "T3FREE", "THYROIDAB" in the last 72 hours. Anemia Panel: No results for input(s): "VITAMINB12", "FOLATE", "FERRITIN", "TIBC", "IRON", "RETICCTPCT" in the last 72 hours. Urine analysis:    Component Value Date/Time   COLORURINE STRAW (A) 12/05/2022 1908   APPEARANCEUR CLEAR 12/05/2022 1908   LABSPEC 1.004 (L) 12/05/2022 1908   PHURINE 7.0 12/05/2022 1908   GLUCOSEU NEGATIVE 12/05/2022 1908   HGBUR SMALL (A) 12/05/2022 1908   BILIRUBINUR NEGATIVE 12/05/2022 1908   KETONESUR NEGATIVE 12/05/2022 1908   PROTEINUR NEGATIVE 12/05/2022 1908   UROBILINOGEN 1.0 03/11/2009 0437   NITRITE NEGATIVE 12/05/2022 1908   LEUKOCYTESUR NEGATIVE 12/05/2022 1908   Sepsis Labs: @LABRCNTIP (procalcitonin:4,lacticidven:4)  ) Recent  Results (from the past 240 hour(s))  Resp panel by RT-PCR (RSV, Flu A&B, Covid) Anterior Nasal Swab     Status: None   Collection Time: 01/25/23  3:21 PM   Specimen: Anterior Nasal Swab  Result Value Ref Range Status   SARS Coronavirus 2 by RT PCR NEGATIVE NEGATIVE Final   Influenza A by PCR NEGATIVE NEGATIVE Final   Influenza B by PCR NEGATIVE NEGATIVE Final    Comment: (NOTE) The Xpert Xpress SARS-CoV-2/FLU/RSV plus assay is intended as an aid in the diagnosis of influenza from Nasopharyngeal swab specimens and should not be used as a sole basis for treatment. Nasal washings and aspirates are unacceptable for Xpert Xpress SARS-CoV-2/FLU/RSV testing.  Fact Sheet for Patients: BloggerCourse.com  Fact Sheet for Healthcare Providers: SeriousBroker.it  This test is not yet approved or cleared by the [place] FDA and has been authorized for detection and/or diagnosis of SARS-CoV-2 by FDA under an Emergency Use Authorization (EUA). This EUA will remain in effect (meaning this test can be used) for the duration of the COVID-19 declaration under Section 564(b)(1) of the Act, 21 U.S.C. section 360bbb-3(b)(1), unless the authorization is terminated or revoked.     Resp  Syncytial Virus by PCR NEGATIVE NEGATIVE Final    Comment: (NOTE) Fact Sheet for Patients: BloggerCourse.com  Fact Sheet for Healthcare Providers: SeriousBroker.it  This test is not yet approved or cleared by the [place] FDA and has been authorized for detection and/or diagnosis of SARS-CoV-2 by FDA under an Emergency Use Authorization (EUA). This EUA will remain in effect (meaning this test can be used) for the duration of the COVID-19 declaration under Section 564(b)(1) of the Act, 21 U.S.C. section 360bbb-3(b)(1), unless the authorization is terminated or revoked.  Performed at [facility],  [address] [place], [place] [address]      Radiology Studies: DG CHEST PORT 1 VIEW  Result Date: 01/26/2023 CLINICAL DATA:  Acute on chronic respiratory failure. EXAM: PORTABLE CHEST 1 VIEW COMPARISON:  Chest x-ray from yesterday. FINDINGS: Unchanged cardiomegaly. New bibasilar consolidation. No pneumothorax or large pleural effusion. No acute osseous abnormality. IMPRESSION: 1. New bibasilar consolidation, concerning for pneumonia or aspiration. Electronically Signed   By: Obie Dredge M.D.   On: 01/26/2023 11:18   DG CHEST PORT 1 VIEW  Result Date: 01/25/2023 CLINICAL DATA:  Shortness of breath. EXAM: PORTABLE CHEST 1 VIEW COMPARISON:  12/05/2022 FINDINGS: Heart is enlarged, stable in configuration. Mildly prominent interstitial markings are similar to prior study. No new consolidations or pleural effusions. Prominent pulmonary artery segments again noted. IMPRESSION: 1. No active disease. 2. Stable cardiomegaly. 3. Prominent pulmonary artery segments. Electronically Signed   By: Norva Pavlov M.D.   On: 01/25/2023 16:50     Scheduled Meds:  amiodarone  200 mg Oral Daily   arformoterol  15 mcg Nebulization BID   atorvastatin  40 mg Oral Daily   budesonide (PULMICORT) nebulizer solution  0.25 mg Nebulization BID   escitalopram  10 mg Oral Daily   furosemide  80 mg Intravenous Q6H   ipratropium-albuterol  3 mL Nebulization Q6H   levothyroxine  88 mcg Oral Q0600   methylPREDNISolone (SOLU-MEDROL) injection  40 mg Intravenous Q12H   revefenacin  175 mcg Nebulization Daily   Continuous Infusions:  albumin human 25 g (01/13/2023 1046)   cefTRIAXone (ROCEPHIN)  IV 2 g (01/26/23 1320)   vancomycin 1,500 mg (01/21/2023 0524)     LOS: 1 day    Time spent:    Zannie Cove, MD [facility]   01/23/2023, 12:00 PM

## 2023-02-06 NOTE — Progress Notes (Signed)
Heart Failure Navigator Progress Note  Assessed for Heart & Vascular TOC clinic readiness.  Patient does not meet criteria due to EF 55-60%, per MD note patient wheelchair /  bed bound. Palliative care has been consulted.   Navigator will sign off at this time.   Rhae Hammock, BSN, Scientist, clinical (histocompatibility and immunogenetics) Only

## 2023-02-06 NOTE — Progress Notes (Signed)
Pt. Noted to be apnic. RN unable to palpate brachial, coratid pulses. No pulse noted upon auscultation. Pts. Family at bedside. On call for [facility] paged to make aware.

## 2023-02-06 NOTE — Death Summary Note (Signed)
Death Summary  [name] ZOX:[id number] DOB: 1955/11/06 DOA: 02/06/2023  PCP: Annita Brod, MD  Admit date: 02-06-2023 Date of Death: February 08, 2023  Final Diagnoses:  Acute hypoxic and hypercarbic respiratory failure Acute on chronic diastolic CHF Liver cirrhosis Anasarca Aspiration pneumonia Severe iron deficiency anemia Morbid obesity OSA/OHS Hyponatremia Paroxysmal A-fib Essential hypertension Hypothyroidism   History of present illness:  67 y.o. female with morbid obesity, BMI 56, untreated OSA, OHS PAF on Eliquis, HFpEF, chronic hypoxic respiratory failure on 5 L [place], T2DM, HTN, hypothyroidism, hepatic cirrhosis, squamous cell cancer of the face s/p radiation, avascular necrosis of right hip with chronic wheelchair-bound status, last admission from 12/05/2022-12/06/2022 for symptomatic anemia requiring transfusion presented with worsening cough and shortness of breath for the last 2 weeks.  On presentation, hemoglobin was 6.2 and was fluid overloaded.  Chest x-ray showed cardiomegaly. BNP of 332.5. She was given IV Lasix.  She was transfused with 1 unit packed red cells. -5/21 AM rapidly decline with worsening hypoxia, repeat chest x-ray with bibasilar consolidation, clinical concern for aspiration, worsening hypercarbic respiratory failure and fluid overload -02/08/23 somnolent, on 40 L high flow nasal cannula  Hospital Course:   Acute on chronic hypoxic and hypercarbic respiratory failure Aspiration pneumonia Diastolic CHF, cirrhosis -Multifactorial, in the background has morbid obesity, OSA/OHS on 5 L O2 at baseline, long smoking history without formal COPD diagnosis -Treated with IV Zosyn for possible aspiration pneumonia -Diuresed with high-dose IV Lasix as well -Seen by PCCM in consultation prognosis felt to be poor decision was made for DNR, and recommended comfort care for any further decline.  This morning was on 40 L high flow nasal cannula and ABG today noted worsening  respiratory acidosis, BiPAP attempted with limited success only, patient was not able to tolerate this  for long, then goals of care discussions were held with patient's family and decision was made to transition to comfort care -Subsequently expired on 5/23 at 8:30 PM   Severe iron deficiency anemia -Transfused 2 units of PRBC, patient is unable to give a history of melena or hematochezia however considering history of cirrhosis suspect GI blood loss -She was not stable for endoscopic eval    Hyponatremia  Paroxysmal A-fib   Essential hypertension Hyperlipidemia   Depression with anxiety -Continue Lexapro   Hypothyroidism -Continue Synthroid   Cirrhosis of liver -Stable.  Outpatient follow-up with GI   Thrombocytopenia, chronic -Possibly from cirrhosis of liver.  Monitor platelets.   Time:  Signed:  Zannie Cove  [facility] 01/28/2023, 2:02 PM

## 2023-02-06 NOTE — Progress Notes (Signed)
    OVERNIGHT PROGRESS REPORT  Notified by RN that patient has expired at on 01/31/2023 @ 2031  Patient was comfort care  2 RN verified.  Family was available to RN.  Carollee Herter, DO [facility]

## 2023-02-06 DEATH — deceased

## 2023-02-15 ENCOUNTER — Ambulatory Visit (HOSPITAL_BASED_OUTPATIENT_CLINIC_OR_DEPARTMENT_OTHER): Payer: Medicare HMO | Admitting: Family
